# Patient Record
Sex: Male | Born: 1940
Health system: Southern US, Community
[De-identification: ages and names within clinical notes are randomized; demographics above are authoritative.]

## PROBLEM LIST (undated history)

## (undated) DIAGNOSIS — I10 Essential (primary) hypertension: Secondary | ICD-10-CM

## (undated) DIAGNOSIS — Z9289 Personal history of other medical treatment: Secondary | ICD-10-CM

## (undated) DIAGNOSIS — I25119 Atherosclerotic heart disease of native coronary artery with unspecified angina pectoris: Secondary | ICD-10-CM

## (undated) DIAGNOSIS — Z8719 Personal history of other diseases of the digestive system: Secondary | ICD-10-CM

## (undated) DIAGNOSIS — E669 Obesity, unspecified: Secondary | ICD-10-CM

## (undated) DIAGNOSIS — K219 Gastro-esophageal reflux disease without esophagitis: Secondary | ICD-10-CM

## (undated) DIAGNOSIS — E785 Hyperlipidemia, unspecified: Secondary | ICD-10-CM

## (undated) HISTORY — DX: Personal history of other medical treatment: Z92.89

## (undated) HISTORY — DX: Essential (primary) hypertension: I10

## (undated) HISTORY — PX: CATARACT EXTRACTION W/ INTRAOCULAR LENS  IMPLANT, BILATERAL: SHX1307

## (undated) HISTORY — DX: Gastro-esophageal reflux disease without esophagitis: K21.9

## (undated) HISTORY — DX: Hyperlipidemia, unspecified: E78.5

---

## 1988-12-01 HISTORY — PX: CORONARY ARTERY BYPASS GRAFT: SHX141

## 1998-09-01 ENCOUNTER — Ambulatory Visit (HOSPITAL_COMMUNITY): Admission: RE | Admit: 1998-09-01 | Discharge: 1998-09-01 | Payer: Self-pay | Admitting: Sports Medicine

## 1998-09-13 ENCOUNTER — Encounter: Admission: RE | Admit: 1998-09-13 | Discharge: 1998-09-13 | Payer: Self-pay | Admitting: Sports Medicine

## 1998-10-08 ENCOUNTER — Encounter: Admission: RE | Admit: 1998-10-08 | Discharge: 1998-10-08 | Payer: Self-pay | Admitting: Family Medicine

## 1999-02-21 ENCOUNTER — Encounter: Admission: RE | Admit: 1999-02-21 | Discharge: 1999-02-21 | Payer: Self-pay | Admitting: Family Medicine

## 1999-03-03 ENCOUNTER — Emergency Department (HOSPITAL_COMMUNITY): Admission: EM | Admit: 1999-03-03 | Discharge: 1999-03-03 | Payer: Self-pay | Admitting: Emergency Medicine

## 1999-10-31 ENCOUNTER — Encounter: Admission: RE | Admit: 1999-10-31 | Discharge: 1999-10-31 | Payer: Self-pay | Admitting: Sports Medicine

## 1999-11-18 ENCOUNTER — Ambulatory Visit (HOSPITAL_COMMUNITY): Admission: RE | Admit: 1999-11-18 | Discharge: 1999-11-18 | Payer: Self-pay | Admitting: Sports Medicine

## 2000-01-20 ENCOUNTER — Encounter: Admission: RE | Admit: 2000-01-20 | Discharge: 2000-01-20 | Payer: Self-pay | Admitting: Family Medicine

## 2000-01-25 ENCOUNTER — Encounter: Payer: Self-pay | Admitting: Family Medicine

## 2000-01-25 ENCOUNTER — Ambulatory Visit (HOSPITAL_COMMUNITY): Admission: RE | Admit: 2000-01-25 | Discharge: 2000-01-25 | Payer: Self-pay | Admitting: Family Medicine

## 2000-01-30 ENCOUNTER — Encounter: Admission: RE | Admit: 2000-01-30 | Discharge: 2000-01-30 | Payer: Self-pay | Admitting: Family Medicine

## 2000-10-21 ENCOUNTER — Ambulatory Visit (HOSPITAL_COMMUNITY): Admission: RE | Admit: 2000-10-21 | Discharge: 2000-10-21 | Payer: Self-pay | Admitting: Sports Medicine

## 2001-02-04 ENCOUNTER — Encounter: Admission: RE | Admit: 2001-02-04 | Discharge: 2001-02-04 | Payer: Self-pay | Admitting: Family Medicine

## 2001-10-11 ENCOUNTER — Encounter: Admission: RE | Admit: 2001-10-11 | Discharge: 2001-10-11 | Payer: Self-pay | Admitting: Family Medicine

## 2002-09-15 ENCOUNTER — Encounter: Admission: RE | Admit: 2002-09-15 | Discharge: 2002-09-15 | Payer: Self-pay | Admitting: Sports Medicine

## 2003-04-13 ENCOUNTER — Encounter: Admission: RE | Admit: 2003-04-13 | Discharge: 2003-04-13 | Payer: Self-pay | Admitting: Sports Medicine

## 2003-05-03 ENCOUNTER — Ambulatory Visit (HOSPITAL_COMMUNITY): Admission: RE | Admit: 2003-05-03 | Discharge: 2003-05-03 | Payer: Self-pay | Admitting: Sports Medicine

## 2003-08-09 ENCOUNTER — Ambulatory Visit (HOSPITAL_COMMUNITY): Admission: RE | Admit: 2003-08-09 | Discharge: 2003-08-09 | Payer: Self-pay | Admitting: Family Medicine

## 2003-08-09 ENCOUNTER — Encounter: Admission: RE | Admit: 2003-08-09 | Discharge: 2003-08-09 | Payer: Self-pay | Admitting: Family Medicine

## 2003-08-24 ENCOUNTER — Encounter: Admission: RE | Admit: 2003-08-24 | Discharge: 2003-08-24 | Payer: Self-pay | Admitting: Family Medicine

## 2005-01-15 ENCOUNTER — Ambulatory Visit: Payer: Self-pay | Admitting: Sports Medicine

## 2005-01-17 ENCOUNTER — Ambulatory Visit: Payer: Self-pay | Admitting: Sports Medicine

## 2005-02-13 ENCOUNTER — Ambulatory Visit (HOSPITAL_COMMUNITY): Admission: RE | Admit: 2005-02-13 | Discharge: 2005-02-13 | Payer: Self-pay | Admitting: Family Medicine

## 2005-02-13 ENCOUNTER — Ambulatory Visit: Payer: Self-pay | Admitting: Family Medicine

## 2006-08-18 ENCOUNTER — Ambulatory Visit (HOSPITAL_COMMUNITY): Admission: RE | Admit: 2006-08-18 | Discharge: 2006-08-18 | Payer: Self-pay | Admitting: Interventional Cardiology

## 2006-08-27 ENCOUNTER — Ambulatory Visit: Payer: Self-pay | Admitting: Sports Medicine

## 2006-09-03 ENCOUNTER — Ambulatory Visit: Payer: Self-pay | Admitting: Sports Medicine

## 2006-12-28 ENCOUNTER — Ambulatory Visit: Payer: Self-pay | Admitting: Family Medicine

## 2006-12-28 ENCOUNTER — Encounter: Payer: Self-pay | Admitting: Family Medicine

## 2006-12-28 LAB — CONVERTED CEMR LAB
ALT: 32 units/L (ref 0–53)
AST: 26 units/L (ref 0–37)
Basophils Absolute: 0 10*3/uL (ref 0.0–0.1)
Basophils Relative: 1 % (ref 0–1)
Chloride: 103 meq/L (ref 96–112)
Creatinine, Ser: 1.04 mg/dL (ref 0.40–1.50)
Eosinophils Absolute: 0.2 10*3/uL (ref 0.0–0.7)
Eosinophils Relative: 3 % (ref 0–5)
HCT: 47.5 % (ref 39.0–52.0)
Hemoglobin: 15.5 g/dL (ref 13.0–17.0)
MCHC: 32.6 g/dL (ref 30.0–36.0)
Monocytes Absolute: 0.8 10*3/uL — ABNORMAL HIGH (ref 0.2–0.7)
RDW: 13.4 % (ref 11.5–14.0)
Sodium: 142 meq/L (ref 135–145)
Total Bilirubin: 0.5 mg/dL (ref 0.3–1.2)
Total Protein: 7.2 g/dL (ref 6.0–8.3)

## 2007-01-28 DIAGNOSIS — L578 Other skin changes due to chronic exposure to nonionizing radiation: Secondary | ICD-10-CM | POA: Insufficient documentation

## 2007-01-28 DIAGNOSIS — E785 Hyperlipidemia, unspecified: Secondary | ICD-10-CM | POA: Insufficient documentation

## 2007-01-28 DIAGNOSIS — I2510 Atherosclerotic heart disease of native coronary artery without angina pectoris: Secondary | ICD-10-CM

## 2007-01-28 DIAGNOSIS — K449 Diaphragmatic hernia without obstruction or gangrene: Secondary | ICD-10-CM | POA: Insufficient documentation

## 2007-12-01 ENCOUNTER — Encounter: Payer: Self-pay | Admitting: Sports Medicine

## 2008-01-18 ENCOUNTER — Encounter: Payer: Self-pay | Admitting: *Deleted

## 2008-02-03 ENCOUNTER — Encounter: Payer: Self-pay | Admitting: *Deleted

## 2008-02-03 ENCOUNTER — Ambulatory Visit: Payer: Self-pay | Admitting: Sports Medicine

## 2008-02-07 LAB — CONVERTED CEMR LAB
ALT: 88 units/L — ABNORMAL HIGH (ref 0–53)
AST: 70 units/L — ABNORMAL HIGH (ref 0–37)
Albumin: 4.5 g/dL (ref 3.5–5.2)
Calcium: 9.1 mg/dL (ref 8.4–10.5)
Chloride: 105 meq/L (ref 96–112)
Creatinine, Ser: 0.93 mg/dL (ref 0.40–1.50)
PSA: 1.03 ng/mL (ref 0.10–4.00)
Platelets: 191 10*3/uL (ref 150–400)
Potassium: 4.4 meq/L (ref 3.5–5.3)
RDW: 13.5 % (ref 11.5–15.5)
Sodium: 141 meq/L (ref 135–145)
Total CHOL/HDL Ratio: 4.2

## 2008-04-04 ENCOUNTER — Telehealth: Payer: Self-pay | Admitting: *Deleted

## 2008-04-27 ENCOUNTER — Ambulatory Visit: Payer: Self-pay | Admitting: Sports Medicine

## 2008-05-09 ENCOUNTER — Encounter (INDEPENDENT_AMBULATORY_CARE_PROVIDER_SITE_OTHER): Payer: Self-pay | Admitting: Gastroenterology

## 2008-05-09 ENCOUNTER — Encounter: Payer: Self-pay | Admitting: Sports Medicine

## 2008-05-09 ENCOUNTER — Ambulatory Visit (HOSPITAL_COMMUNITY): Admission: RE | Admit: 2008-05-09 | Discharge: 2008-05-09 | Payer: Self-pay | Admitting: Gastroenterology

## 2008-05-12 ENCOUNTER — Encounter: Payer: Self-pay | Admitting: *Deleted

## 2008-09-04 ENCOUNTER — Ambulatory Visit: Payer: Self-pay | Admitting: Family Medicine

## 2009-01-01 ENCOUNTER — Encounter: Payer: Self-pay | Admitting: *Deleted

## 2009-04-18 ENCOUNTER — Ambulatory Visit: Payer: Self-pay | Admitting: Family Medicine

## 2009-04-18 ENCOUNTER — Ambulatory Visit (HOSPITAL_COMMUNITY): Admission: RE | Admit: 2009-04-18 | Discharge: 2009-04-18 | Payer: Self-pay | Admitting: Family Medicine

## 2009-04-18 DIAGNOSIS — I10 Essential (primary) hypertension: Secondary | ICD-10-CM | POA: Insufficient documentation

## 2009-04-18 LAB — CONVERTED CEMR LAB
ALT: 62 units/L — ABNORMAL HIGH (ref 0–53)
CO2: 23 meq/L (ref 19–32)
Cholesterol: 162 mg/dL (ref 0–200)
LDL Cholesterol: 99 mg/dL (ref 0–99)
PSA: 1.21 ng/mL (ref 0.10–4.00)
Sodium: 141 meq/L (ref 135–145)
Total Bilirubin: 0.4 mg/dL (ref 0.3–1.2)
Total Protein: 6.8 g/dL (ref 6.0–8.3)
VLDL: 29 mg/dL (ref 0–40)

## 2009-04-19 ENCOUNTER — Encounter: Payer: Self-pay | Admitting: Family Medicine

## 2009-04-24 ENCOUNTER — Encounter: Payer: Self-pay | Admitting: Family Medicine

## 2009-05-30 ENCOUNTER — Ambulatory Visit: Payer: Self-pay | Admitting: Family Medicine

## 2009-12-01 HISTORY — PX: CORONARY ANGIOPLASTY WITH STENT PLACEMENT: SHX49

## 2010-01-16 ENCOUNTER — Ambulatory Visit (HOSPITAL_COMMUNITY): Admission: RE | Admit: 2010-01-16 | Discharge: 2010-01-16 | Payer: Self-pay | Admitting: Family Medicine

## 2010-01-16 ENCOUNTER — Ambulatory Visit: Payer: Self-pay | Admitting: Family Medicine

## 2010-01-16 DIAGNOSIS — N529 Male erectile dysfunction, unspecified: Secondary | ICD-10-CM

## 2010-01-16 DIAGNOSIS — R351 Nocturia: Secondary | ICD-10-CM

## 2010-01-16 LAB — CONVERTED CEMR LAB
AST: 54 units/L — ABNORMAL HIGH (ref 0–37)
Alkaline Phosphatase: 44 units/L (ref 39–117)
Bilirubin, Direct: 0.1 mg/dL (ref 0.0–0.3)
Indirect Bilirubin: 0.4 mg/dL (ref 0.0–0.9)
Potassium: 4.1 meq/L (ref 3.5–5.3)
Sodium: 138 meq/L (ref 135–145)
Total Bilirubin: 0.5 mg/dL (ref 0.3–1.2)

## 2010-01-17 ENCOUNTER — Encounter: Payer: Self-pay | Admitting: Family Medicine

## 2010-02-12 ENCOUNTER — Inpatient Hospital Stay (HOSPITAL_BASED_OUTPATIENT_CLINIC_OR_DEPARTMENT_OTHER): Admission: RE | Admit: 2010-02-12 | Discharge: 2010-02-12 | Payer: Self-pay | Admitting: Interventional Cardiology

## 2010-02-13 ENCOUNTER — Encounter: Payer: Self-pay | Admitting: Family Medicine

## 2010-02-15 ENCOUNTER — Inpatient Hospital Stay (HOSPITAL_COMMUNITY): Admission: RE | Admit: 2010-02-15 | Discharge: 2010-02-16 | Payer: Self-pay | Admitting: Interventional Cardiology

## 2010-04-17 ENCOUNTER — Encounter: Payer: Self-pay | Admitting: Family Medicine

## 2010-07-10 ENCOUNTER — Encounter: Payer: Self-pay | Admitting: Family Medicine

## 2010-07-23 ENCOUNTER — Encounter: Payer: Self-pay | Admitting: Family Medicine

## 2010-07-23 DIAGNOSIS — H3560 Retinal hemorrhage, unspecified eye: Secondary | ICD-10-CM

## 2010-10-07 ENCOUNTER — Ambulatory Visit: Payer: Self-pay | Admitting: Family Medicine

## 2010-10-07 DIAGNOSIS — M79609 Pain in unspecified limb: Secondary | ICD-10-CM | POA: Insufficient documentation

## 2010-11-04 ENCOUNTER — Ambulatory Visit: Payer: Self-pay | Admitting: Family Medicine

## 2010-11-04 DIAGNOSIS — M25539 Pain in unspecified wrist: Secondary | ICD-10-CM | POA: Insufficient documentation

## 2010-11-08 ENCOUNTER — Telehealth (INDEPENDENT_AMBULATORY_CARE_PROVIDER_SITE_OTHER): Payer: Self-pay | Admitting: *Deleted

## 2010-11-08 ENCOUNTER — Encounter: Payer: Self-pay | Admitting: Family Medicine

## 2010-11-18 ENCOUNTER — Encounter: Payer: Self-pay | Admitting: Family Medicine

## 2010-11-22 ENCOUNTER — Encounter: Payer: Self-pay | Admitting: Family Medicine

## 2010-12-06 ENCOUNTER — Ambulatory Visit: Admit: 2010-12-06 | Payer: Self-pay | Admitting: Family Medicine

## 2010-12-20 ENCOUNTER — Other Ambulatory Visit (HOSPITAL_COMMUNITY): Payer: Self-pay | Admitting: Interventional Cardiology

## 2010-12-21 ENCOUNTER — Other Ambulatory Visit (HOSPITAL_COMMUNITY): Payer: Self-pay | Admitting: Interventional Cardiology

## 2010-12-21 DIAGNOSIS — I2510 Atherosclerotic heart disease of native coronary artery without angina pectoris: Secondary | ICD-10-CM

## 2010-12-31 NOTE — Letter (Signed)
Summary: Wellness visit letter  [facility]  [address]   [place], [place] [address]   Phone: [phone]  Fax: [phone]    04/17/2010  Eulogio Samaras [address] [place], [place]  [address]  Dear Mr. ACCARDO,  We are happy to let you know that since you are covered under Medicare you are able to have a FREE visit at the [facility] to discuss your HEALTH. This is a new benefit for Medicare.  There will be no co-payment.  At this visit you will meet with Luretha Murphy an expert in wellness and the nurse practitioner at our clinic.  At this visit we will discuss ways to keep you healthy and feeling well.  This visit will not replace your regular doctor visit and we cannot refill medications.  We may schedule future blood work, give shots if needed, or schedule tests to look for hidden problems.   You will need to plan to be here at least one hour to talk about your medical history, your current status, review all of your medications, and discuss your future plans for your health.  This information will be entered into your record for your doctor to have and review.  If you are interested in staying healthy, this type of visit can help.  Please call the office at: [phone], to schedule a "Medicare Wellness Visit".  The day of the visit you should bring in all of your medications, including any vitamins, herbs, over the counter products you take.  Make a list of all the other doctors that you see, so we know who they are. If you have any other health documents please bring them.  We look forward to helping you stay healthy.    Sincerely,   Luretha Murphy NP  Appended Document: Wellness visit letter mailed.

## 2010-12-31 NOTE — Assessment & Plan Note (Signed)
Summary: F/U HAND,MC   Vital Signs:  Patient profile:   70 year old male BP sitting:   142 / 84  Vitals Entered By: Lillia Pauls CMA (November 04, 2010 2:30 PM)  Serial Vital Signs/Assessments:  Time      Position  BP       Pulse  Resp  Temp     By                     135/72                         [name] MD   History of Present Illness: 1,2) Continued pain, stiffness and numbness in hand--the stiffness is  less than at last visit--he thinks thre diclofenac helped some. He can now localize his pain better and can localize a numbness  primarily im his thumb index finger and long finger. Describes as a burning--worse at night and when he sleeps. He has to wake up and "hang my arm off the bed" to make it better.  PERTINENT PMH/PSH:  He has history of ulnar neuropathy at elow on left  3) also f/u htn--brings some BP readings. 110/62 -132/78--essentially one month of readings.. Taking medicines regularly with no problems. Not having any any headaches or chest pains.    Current Medications (verified): 1)  Tenoretic 100 100-25 Mg Tabs (Atenolol-Chlorthalidone) .Marland Kitchen.. 1 By Mouth Qd 2)  Prilosec 20 Mg  Cpdr (Omeprazole) .... Take One Capsule Daily 3)  Vytorin 10-80 Mg  Tabs (Ezetimibe-Simvastatin) .... Take One Tablet Daily 4)  Nexium 20 Mg  Cpdr (Esomeprazole Magnesium) .... Take One Capsule Daily 5)  Diclofenac Sodium 75 Mg Tbec (Diclofenac Sodium) .... Two Times A Day As Directed  Allergies: 1)  Sulfa 2)  Crestor (Rosuvastatin Calcium) 3)  Lipitor (Atorvastatin) 4)  Ace Inhibitors  Past History:  Past Medical History: Last updated: 02/13/2010 peridontal disease,  ulnar neuropathy  lt at elbow allergic to sulfa cath 02/12/2009 disease in grafts Dr Verdis Prime  Physical Exam  General:  alert, well-developed, well-nourished, and well-hydrated.   Neck:  supple, full ROM, no masses, and no thyromegaly.   Lungs:  normal respiratory effort and normal breath sounds.   Heart:   normal rate, regular rhythm, and no murmur.   Msk:  WRIST: Negative tinel and phalen's on left wrist. Full strength flexion / extension wrist.  FOREARM: normal supination / pronation ROM and strength Partially positive tinel at cubital tunnel but does  not radiate to hand.  HAMD: No thenar atrophy. Grip strength seems symmetrical to me 5/5.   DIP / PIP joints some slight arthritic change but no gross deformity  NEURO soft touch  sense is intact B UE. DTRs 1+ forearm  and elbow B symmetrical  VASC: radial pulse 2+ B=. Allen's test normal B.   Impression & Recommendations:  Problem # 1:  WRIST PAIN, LEFT (ION-629.52)  Orders: Splint Wrist (W4132) I think his PAIN and STIFFNESS iin wrist  is from some mild OA and that is why diclofenac helped. I think his pain in his hand is more of a neuropathy--given his hx of yulnar neuropathy (idiopathio and his current McDonald will place in cock up wrist splint for night use, getn PNCVs. For now continue diclofenac although I would not want to keep that long term due to his CV issues.  RTC 1 m (after PNCV)  Problem # 2:  HYPERTENSION, BENIGN ESSENTIAL (  ICD-401.1)  His updated medication list for this problem includes:    Tenoretic 100 100-25 Mg Tabs (Atenolol-chlorthalidone) .Marland Kitchen... 1 by mouth qd home BP readings are really good. He was a little higher initiartclly today so there may be some anxiety issues (white coat htn). No med changes.  Complete Medication List: 1)  Tenoretic 100 100-25 Mg Tabs (Atenolol-chlorthalidone) .Marland Kitchen.. 1 by mouth qd 2)  Prilosec 20 Mg Cpdr (Omeprazole) .... Take one capsule daily 3)  Vytorin 10-80 Mg Tabs (Ezetimibe-simvastatin) .... Take one tablet daily 4)  Nexium 20 Mg Cpdr (Esomeprazole magnesium) .... Take one capsule daily 5)  Diclofenac Sodium 75 Mg Tbec (Diclofenac sodium) .... Two times a day as directed Nerve Conduction (Nerve Conduction) Splint Wrist (V7846)   Orders Added: 1)  Nerve Conduction [Nerve  Conduction] 2)  Splint Wrist [L3908] 3)  Est. Patient Level IV [[address]]    Prevention & Chronic Care Immunizations   Influenza vaccine: Fluvax Non-MCR  (09/05/2008)   Influenza vaccine due: 09/05/2009    Tetanus booster: Not documented    Pneumococcal vaccine: Pneumovax (Medicare)  (04/18/2009)   Pneumococcal vaccine due: None    H. zoster vaccine: Not documented  Colorectal Screening   Hemoccult: not indicated  (05/30/2009)   Hemoccult due: Not Indicated    Colonoscopy: abnormal  (05/04/2008)   Colonoscopy due: 05/04/2013  Other Screening   PSA: 1.21  (04/18/2009)   PSA due due: 04/18/2010   Smoking status: never  (10/07/2010)  Lipids   Total Cholesterol: 162  (04/18/2009)   Lipid panel action/deferral: Lipid Panel ordered   LDL: 99  (04/18/2009)   LDL Direct: Not documented   HDL: 34  (04/18/2009)   Triglycerides: 143  (04/18/2009)    SGOT (AST): 54  (01/16/2010)   BMP action: Ordered   SGPT (ALT): 52  (01/16/2010)   Alkaline phosphatase: 44  (01/16/2010)   Total bilirubin: 0.5  (01/16/2010)  Hypertension   Last Blood Pressure: 142 / 84  (11/04/2010)   Serum creatinine: 0.91  (01/16/2010)   Serum potassium 4.1  (01/16/2010)    Hypertension flowsheet reviewed?: Yes   Progress toward BP goal: At goal  Self-Management Support :   Personal Goals (by the next clinic visit) :      Personal blood pressure goal: 130/80  (01/16/2010)     Personal LDL goal: 100  (01/16/2010)    Hypertension self-management support: Not documented    Lipid self-management support: Not documented     Impression & Recommendations: Orders: Splint Wrist (M8413) His updated medication list for this problem includes:    Tenoretic 100 100-25 Mg Tabs (Atenolol-chlorthalidone) .Marland Kitchen... 1 by mouth qd  Complete Medication List: 1)  Tenoretic 100 100-25 Mg Tabs (Atenolol-chlorthalidone) .Marland Kitchen.. 1 by mouth qd 2)  Prilosec 20 Mg Cpdr (Omeprazole) .... Take one capsule daily 3)  Vytorin  10-80 Mg Tabs (Ezetimibe-simvastatin) .... Take one tablet daily 4)  Nexium 20 Mg Cpdr (Esomeprazole magnesium) .... Take one capsule daily 5)  Diclofenac Sodium 75 Mg Tbec (Diclofenac sodium) .... Two times a day as directed  Other Orders: Nerve Conduction (Nerve Conduction)

## 2010-12-31 NOTE — Assessment & Plan Note (Signed)
Summary: cpe,df   Vital Signs:  Patient profile:   70 year old male Height:      66.5 inches Weight:      203.4 pounds BMI:     32.45 Temp:     98.0 degrees F oral Pulse rate:   56 / minute BP sitting:   127 / 77  (left arm) Cuff size:   large  Vitals Entered By: Gladstone Pih (January 16, 2010 8:41 AM) CC: CPE Is Patient Diabetic? No Pain Assessment Patient in pain? no      Comments C/O cold and cough X 3 mos, insomnia,restless leg    CC:  CPE.  History of Present Illness: here for CPE but has several issues 1) cough off and on for 3 monhs. seen twice at [facility] and tx both times w abx. He got some better but now is having cough again and somee significant fatigue. Denies chest  pains, no change in exertion level, no dizziness, no edema.   2) Insomnia--some difficulty getting to sleep but nce asleep cannot maintain--has to get up and go to batheroom several times a nigt and this seems to disrupt his sleep as well.   3) Erectile dysfunction--erections no longer sufficient for full intercourse.  4) concerns about "heart" as his CABG was 30 y ago and he has not been re-eval by stress test or other functinal study since 2007 (Dr Katrinka Blazing) .  5) saw the eye doctor recenty ahd they found a hemorrhage on his right retina--they are following it up in 3 months.  Habits & Providers  Alcohol-Tobacco-Diet     Tobacco Status: never  Current Medications (verified): 1)  Tenoretic 100 100-25 Mg Tabs (Atenolol-Chlorthalidone) .Marland Kitchen.. 1 By Mouth Qd 2)  Prilosec 20 Mg  Cpdr (Omeprazole) .... Take One Capsule Daily 3)  Vytorin 10-80 Mg  Tabs (Ezetimibe-Simvastatin) .... Take One Tablet Daily 4)  Nexium 20 Mg  Cpdr (Esomeprazole Magnesium) .... Take One Capsule Daily  Allergies: 1)  Sulfa 2)  Crestor (Rosuvastatin Calcium) 3)  Lipitor (Atorvastatin) 4)  Ace Inhibitors  Past History:  Past Medical History: Last updated: 04/27/2008 peridontal disease,  ulnar neuropathy  lt at  elbow allergic to sulfa  Past Surgical History: Last updated: 04/18/2009 coronary artery bypass  4V- 12/01/1988, Cryotherapy - 09/03/2006,  ETT - [employer] fitness - 05/02/2003,  ETT -dr Katrinka Blazing - 9:13mins - 08/27/2006  UGI Series -  Family History: Last updated: 02/03/2008 brother with cervical disk  father MI/ chf died 40  mother RA/ uterine cancer died 41s  Social History: Last updated: 02/03/2008 drives truck now retired;  eats low fat/ low salt at home but not on road  exercises little;  no smoking/ no etoh helps care for grand kids  Risk Factors: Smoking Status: never (01/16/2010)  Physical Exam  General:  alert and well-developed.   Eyes:  vision grossly intact, pupils equal, pupils round, and pupils reactive to light.   Ears:  R ear normal and L ear normal.   Mouth:  pharynx pink and moist.   Neck:  supple, full ROM, no masses, no thyromegaly, no JVD, and no carotid bruits.   Chest Wall:  well healed central scar Lungs:  normal respiratory effort and normal breath sounds.   Heart:  normal rate, regular rhythm, and no murmur.   Abdomen:  soft and non-tender.   Genitalia:  deferred to urology Prostate:  deferred to urology Msk:  normal ROM, no joint tenderness, and no joint swelling.   Pulses:  DP 2+ B= Extremities:  no edema Neurologic:  alert & oriented X3, strength normal in all extremities, and gait normal.   Skin:  full skin exam revealed no worrisome lesions Cervical Nodes:  No lymphadenopathy noted Psych:  Oriented X3, good eye contact, not anxious appearing, and not depressed appearing.     Impression & Recommendations:  Problem # 1:  COUGH (ICD-786.2)  Orders: CXR- 2view (CXR) leng exam normal. Will check CXR. Cough non productive--allergy and post nasal gtt most liekly source  or repeat viral URI. f/u 1 m if continued cough  Problem # 2:  CORONARY, ARTERIOSCLEROSIS (ICD-414.00)  His updated medication list for this problem includes:    Tenoretic 100  100-25 Mg Tabs (Atenolol-chlorthalidone) .Marland Kitchen... 1 by mouth qd  Orders: Cardiology Referral (Cardiology) he needs to be followed by cardiology and likely needs functional study as CABG now 42 y old.  Problem # 3:  HYPERTENSION, BENIGN ESSENTIAL (ICD-401.1)  His updated medication list for this problem includes:    Tenoretic 100 100-25 Mg Tabs (Atenolol-chlorthalidone) .Marland Kitchen... 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 PSA 1.21 in May 2010  Problem # 5:  HYPERLIPIDEMIA (ICD-272.4)  His updated medication list for this problem includes:    Vytorin 10-80 Mg Tabs (Ezetimibe-simvastatin) .Marland Kitchen... Take one tablet daily  Future Orders: T-Lipid Profile ([id number]) ... 01/16/2011  Complete Medication List: 1)  Tenoretic 100 100-25 Mg Tabs (Atenolol-chlorthalidone) .Marland Kitchen.. 1 by mouth qd 2)  Prilosec 20 Mg Cpdr (Omeprazole) .... Take one capsule daily 3)  Vytorin 10-80 Mg Tabs (Ezetimibe-simvastatin) .... Take one tablet daily 4)  Nexium 20 Mg Cpdr (Esomeprazole magnesium) .... Take one capsule daily  Other Orders: T-Hepatic Function [phone]) Future Orders: PSA-[facility] ([id number]) ... 01/16/2011   Prevention & Chronic Care Immunizations   Influenza vaccine: Fluvax Non-MCR  (09/05/2008)   Influenza vaccine due: 09/05/2009    Tetanus booster: Not documented    Pneumococcal vaccine: Pneumovax (Medicare)  (04/18/2009)   Pneumococcal vaccine due: None    H. zoster vaccine: Not documented  Colorectal Screening   Hemoccult: not indicated  (05/30/2009)   Hemoccult due: Not Indicated    Colonoscopy: abnormal  (05/04/2008)   Colonoscopy due: 05/04/2013  Other Screening   PSA: 1.21  (04/18/2009)   PSA ordered.   PSA due due: 04/18/2010   Smoking status: never  (01/16/2010)  Lipids   Total Cholesterol: 162   (04/18/2009)   Lipid panel action/deferral: Lipid Panel ordered   LDL: 99  (04/18/2009)   LDL Direct: Not documented   HDL: 34  (04/18/2009)   Triglycerides: 143  (04/18/2009)    SGOT (AST): 65  (04/18/2009)   BMP action: Ordered   SGPT (ALT): 62  (04/18/2009)   Alkaline phosphatase: 57  (04/18/2009)   Total bilirubin: 0.4  (04/18/2009)    Lipid flowsheet reviewed?: Yes   Progress toward LDL goal: At goal  Hypertension   Last Blood Pressure: 127 / 77  (01/16/2010)   Serum creatinine: 1.06  (04/18/2009)   Serum potassium 4.2  (04/18/2009)    Hypertension flowsheet reviewed?: Yes   Progress toward BP goal: At goal  Self-Management Support :   Personal Goals (by the next clinic visit) :      Personal blood pressure goal: 130/80  (01/16/2010)     Personal LDL goal: 100  (01/16/2010)    Hypertension  self-management support: Not documented    Lipid self-management support: Not documented      Appended Document: cpe,df    Clinical Lists Changes  Problems: Added new problem of PREVENTIVE HEALTH CARE (ICD-V70.0) Orders: Added new Test order of [facility] [place] [facility] - Est  65+ [phone]) - Signed       Complete Medication List: 1)  Tenoretic 100 100-25 Mg Tabs (Atenolol-chlorthalidone) .Marland Kitchen.. 1 by mouth qd 2)  Prilosec 20 Mg Cpdr (Omeprazole) .... Take one capsule daily 3)  Vytorin 10-80 Mg Tabs (Ezetimibe-simvastatin) .... Take one tablet daily 4)  Nexium 20 Mg Cpdr (Esomeprazole magnesium) .... Take one capsule daily

## 2010-12-31 NOTE — Letter (Signed)
Summary: [facility]  [address]   [place], [place] [address]   Phone: [phone]  Fax: [phone]    01/17/2010  Joshua [name] [address] [place], [place]  [address]  Dear [name],  Your blood sugar, kiney function, electrolytes and kidney function are all normal. Your liver function tests have a very mild but insignificant elevation--probably related to your treatment with the vytorin. It is in the  range where I would recheck in 6 months, but I would not change anything.          Sincerely,   [name] MD  Appended Document: LABLetter mailed.

## 2010-12-31 NOTE — Assessment & Plan Note (Signed)
Summary: HAND PAIN,MC   Vital Signs:  Patient profile:   70 year old male Height:      69 inches Weight:      204 pounds BP sitting:   159 / 89  Vitals Entered By: Rochele Pages RN (October 07, 2010 2:31 PM)   History of Present Illness: Left hand pain 10 days. Started after he "scraped" his arm on a bush. Had small laceration but did not observe any swelling or erythema. Pain has been worse in am--difficulty closing his hand fully and pain with grasp--this usually gets better during the day. Today he actually had NO hand pain this am and feels fine now.  Denies fever, red streaks down arm or other signs of infection. Right hand dominant. Otherwise feels generally at baseline  Preventive Screening-Counseling & Management  Alcohol-Tobacco     Smoking Status: never  Current Medications (verified): 1)  Tenoretic 100 100-25 Mg Tabs (Atenolol-Chlorthalidone) .Marland Kitchen.. 1 By Mouth Qd 2)  Prilosec 20 Mg  Cpdr (Omeprazole) .... Take One Capsule Daily 3)  Vytorin 10-80 Mg  Tabs (Ezetimibe-Simvastatin) .... Take One Tablet Daily 4)  Nexium 20 Mg  Cpdr (Esomeprazole Magnesium) .... Take One Capsule Daily 5)  Diclofenac Sodium 75 Mg Tbec (Diclofenac Sodium) .... Two Times A Day As Directed  Allergies: 1)  Sulfa 2)  Crestor (Rosuvastatin Calcium) 3)  Lipitor (Atorvastatin) 4)  Ace Inhibitors  Review of Systems  The patient denies anorexia, fever, peripheral edema, headaches, abdominal pain, muscle weakness, and angioedema.    Physical Exam  General:  alert, well-developed, well-nourished, and well-hydrated.   Additional Exam:  RIGHT hand normal strength on grip, wrist flexion and extension. Fingers normal adduction and abduction. Thumb normal in all planes.   PULSES 2+ symmetrically + radial.  SKIN no lesions on hands, some generlly dry skin, left forearm small scraped area that is healing without any sogn of infection.  AXILLA left no LAD. SHOULDER FROM.   Impression &  Recommendations:  Problem # 1:  HAND PAIN, LEFT (ICD-729.5) unclea4r--he may have had a small contusion associated with his "scrape" that he was unaware of--injuring  forearm causing some stiffness OR this may be some generalized DJD. Will treat with NSAID 1 week and see how he does.  Complete Medication List: 1)  Tenoretic 100 100-25 Mg Tabs (Atenolol-chlorthalidone) .Marland Kitchen.. 1 by mouth qd 2)  Prilosec 20 Mg Cpdr (Omeprazole) .... Take one capsule daily 3)  Vytorin 10-80 Mg Tabs (Ezetimibe-simvastatin) .... Take one tablet daily 4)  Nexium 20 Mg Cpdr (Esomeprazole magnesium) .... Take one capsule daily 5)  Diclofenac Sodium 75 Mg Tbec (Diclofenac sodium) .... Two times a day as directed Prescriptions: DICLOFENAC SODIUM 75 MG TBEC (DICLOFENAC SODIUM) two times a day as directed  #60 x 0   Entered and Authorized by:   [name] MD   Signed by:   [name] MD on 10/07/2010   Method used:   Electronically to        [facility]* (retail)       [address]       [address] Shipping/mailing       [place], [place]  [address]       Ph: [phone]       Fax: [phone]   RxID:   [id number]    Orders Added: 1)  Est. Patient Level III [51884]

## 2010-12-31 NOTE — Miscellaneous (Signed)
  Clinical Lists Changes  Observations: Added new observation of PAST MED HX: peridontal disease,  ulnar neuropathy  lt at elbow allergic to sulfa cath 02/12/2009 disease in grafts Dr Verdis Prime (02/13/2010 8:37)      Complete Medication List: 1)  Tenoretic 100 100-25 Mg Tabs (Atenolol-chlorthalidone) .Marland Kitchen.. 1 by mouth qd 2)  Prilosec 20 Mg Cpdr (Omeprazole) .... Take one capsule daily 3)  Vytorin 10-80 Mg Tabs (Ezetimibe-simvastatin) .... Take one tablet daily 4)  Nexium 20 Mg Cpdr (Esomeprazole magnesium) .... Take one capsule daily   Past History:  Past Medical History: peridontal disease,  ulnar neuropathy  lt at elbow allergic to sulfa cath 02/12/2009 disease in grafts Dr Verdis Prime

## 2010-12-31 NOTE — Consult Note (Signed)
Summary: [facility]  [facility]   Imported By: Clydell Hakim 07/17/2010 15:02:32  _____________________________________________________________________  External Attachment:    Type:   Image     Comment:   External Document

## 2010-12-31 NOTE — Progress Notes (Signed)
  Phone Note Other Incoming Call back at [phone] ext [phone]   Caller: [facility] Summary of Call: Send office notes on patient for referral.  Fax# [phone] Initial call taken by: Abundio Miu,  November 08, 2010 9:33 AM  Follow-up for Phone Call        faxed Follow-up by: Lillia Pauls CMA,  November 08, 2010 11:34 AM

## 2010-12-31 NOTE — Letter (Signed)
Summary: [facility]   [facility]   Imported By: Marily Memos 11/04/2010 15:20:56  _____________________________________________________________________  External Attachment:    Type:   Image     Comment:   External Document

## 2010-12-31 NOTE — Miscellaneous (Signed)
  Clinical Lists Changes  Problems: Added new problem of History of  RETINAL HEMORRHAGE (ICD-362.81) Removed problem of COUGH (ICD-786.2) Removed problem of UNSPECIFIED SINUSITIS (ICD-473.9) Removed problem of SPECIAL SCREENING MALIGNANT NEOPLASM OF PROSTATE (ICD-V76.44)

## 2011-01-02 NOTE — Consult Note (Signed)
Summary: [facility]  [facility]   Imported By: De Nurse 11/21/2010 16:22:59  _____________________________________________________________________  External Attachment:    Type:   Image     Comment:   External Document

## 2011-01-02 NOTE — Letter (Signed)
Summary: [name]   Imported By: Marily Memos 11/11/2010 10:28:30  _____________________________________________________________________  External Attachment:    Type:   Image     Comment:   External Document

## 2011-01-02 NOTE — Letter (Signed)
Summary: NCS Letter  [facility]  [address]   [place], [place] [address]   Phone: [phone]  Fax: [phone]    11/22/2010  Irby Perra [address] [place], [place]  [address]  Dear Mr. JANES,   The nerveconductin studies showed a mild carpal tunnel syndrome. I think this accounts for your symptoms. If it is bothering you enough, I can set you up to see a surgeon. Let me know if you want to do that.        Sincerely,   [name] MD  Appended Document: NCS Letter mailed

## 2011-01-09 ENCOUNTER — Encounter: Payer: Self-pay | Admitting: *Deleted

## 2011-02-11 ENCOUNTER — Encounter: Payer: Self-pay | Admitting: Home Health Services

## 2011-02-18 ENCOUNTER — Encounter: Payer: Self-pay | Admitting: Family Medicine

## 2011-02-24 LAB — POCT I-STAT, CHEM 8
BUN: 16 mg/dL (ref 6–23)
Calcium, Ion: 1.12 mmol/L (ref 1.12–1.32)
Creatinine, Ser: 0.9 mg/dL (ref 0.4–1.5)
TCO2: 26 mmol/L (ref 0–100)

## 2011-02-24 LAB — BASIC METABOLIC PANEL
CO2: 27 mEq/L (ref 19–32)
Calcium: 8.7 mg/dL (ref 8.4–10.5)
Chloride: 96 mEq/L (ref 96–112)
Creatinine, Ser: 1 mg/dL (ref 0.4–1.5)
Glucose, Bld: 107 mg/dL — ABNORMAL HIGH (ref 70–99)

## 2011-02-24 LAB — CBC
HCT: 39.5 % (ref 39.0–52.0)
MCV: 93 fL (ref 78.0–100.0)
RBC: 4.25 MIL/uL (ref 4.22–5.81)
WBC: 6.7 10*3/uL (ref 4.0–10.5)

## 2011-02-27 NOTE — Miscellaneous (Signed)
  Clinical Lists Changes  Observations: Added new observation of DM PROGRESS: N/A (02/18/2011 16:31) Added new observation of DM FSREVIEW: N/A (02/18/2011 16:31)      Complete Medication List: 1)  Tenoretic 100 100-25 Mg Tabs (Atenolol-chlorthalidone) .Marland Kitchen.. 1 by mouth qd 2)  Prilosec 20 Mg Cpdr (Omeprazole) .... Take one capsule daily 3)  Vytorin 10-80 Mg Tabs (Ezetimibe-simvastatin) .... Take one tablet daily 4)  Nexium 20 Mg Cpdr (Esomeprazole magnesium) .... Take one capsule daily 5)  Diclofenac Sodium 75 Mg Tbec (Diclofenac sodium) .... 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 Dr Verdis Prime  Past Surgical History: Last updated: 04/18/2009 coronary artery bypass  4V- 12/01/1988, Cryotherapy - 09/03/2006,  ETT - high fitness - 05/02/2003,  ETT -dr Katrinka Blazing - 9:31mins - 08/27/2006  UGI Series -  Family History: Last updated: 02/03/2008 brother with cervical disk  father MI/ chf died 36  mother RA/ uterine cancer died 51s  Social History: Last updated: 02/03/2008 drives truck now retired;  eats low fat/ low salt at home but not on road  exercises little;  no smoking/ no etoh helps care for grand kids    Prevention & Chronic Care Immunizations   Influenza vaccine: Fluvax Non-MCR  (09/05/2008)   Influenza vaccine due: 09/05/2009    Tetanus booster: Not documented    Pneumococcal vaccine: Pneumovax (Medicare)  (04/18/2009)   Pneumococcal vaccine due: None    H. zoster vaccine: Not documented  Colorectal Screening   Hemoccult: not indicated  (05/30/2009)   Hemoccult due: Not Indicated    Colonoscopy: abnormal  (05/04/2008)   Colonoscopy due: 05/04/2013  Other Screening   PSA: 1.21  (04/18/2009)   PSA due due: 04/18/2010   Smoking status: never  (10/07/2010)  Lipids   Total Cholesterol: 162   (04/18/2009)   Lipid panel action/deferral: Lipid Panel ordered   LDL: 99  (04/18/2009)   LDL Direct: Not documented   HDL: 34  (04/18/2009)   Triglycerides: 143  (04/18/2009)    SGOT (AST): 54  (01/16/2010)   BMP action: Ordered   SGPT (ALT): 52  (01/16/2010)   Alkaline phosphatase: 44  (01/16/2010)   Total bilirubin: 0.5  (01/16/2010)  Hypertension   Last Blood Pressure: 142 / 84  (11/04/2010)   Serum creatinine: 0.91  (01/16/2010)   Serum potassium 4.1  (01/16/2010)  Self-Management Support :   Personal Goals (by the next clinic visit) :      Personal blood pressure goal: 130/80  (01/16/2010)     Personal LDL goal: 100  (01/16/2010)    Hypertension self-management support: Not documented    Lipid self-management support: Not documented

## 2011-04-03 ENCOUNTER — Other Ambulatory Visit (HOSPITAL_COMMUNITY): Payer: Self-pay | Admitting: Interventional Cardiology

## 2011-04-03 DIAGNOSIS — I2510 Atherosclerotic heart disease of native coronary artery without angina pectoris: Secondary | ICD-10-CM

## 2011-04-04 ENCOUNTER — Encounter (HOSPITAL_COMMUNITY): Payer: Self-pay

## 2011-04-04 ENCOUNTER — Ambulatory Visit (HOSPITAL_COMMUNITY): Payer: Self-pay

## 2011-04-04 ENCOUNTER — Encounter (HOSPITAL_COMMUNITY)
Admission: RE | Admit: 2011-04-04 | Discharge: 2011-04-04 | Disposition: A | Discharge status: Home / Self Care | Payer: 59 | Source: Ambulatory Visit | Attending: Interventional Cardiology | Admitting: Interventional Cardiology

## 2011-04-04 DIAGNOSIS — R079 Chest pain, unspecified: Secondary | ICD-10-CM | POA: Insufficient documentation

## 2011-04-04 DIAGNOSIS — I2510 Atherosclerotic heart disease of native coronary artery without angina pectoris: Secondary | ICD-10-CM | POA: Insufficient documentation

## 2011-04-04 MED ORDER — TECHNETIUM TC 99M TETROFOSMIN IV KIT
10.0000 | PACK | Freq: Once | INTRAVENOUS | Status: AC | PRN
  Administered 2011-04-04: 10 via INTRAVENOUS

## 2011-04-04 MED ORDER — TECHNETIUM TC 99M TETROFOSMIN IV KIT
30.0000 | PACK | Freq: Once | INTRAVENOUS | Status: AC | PRN
  Administered 2011-04-04: 30 via INTRAVENOUS

## 2011-04-07 ENCOUNTER — Encounter (HOSPITAL_COMMUNITY): Payer: Self-pay

## 2011-04-15 NOTE — Op Note (Signed)
Joshua [name], Joshua [name]              ACCOUNT NO.:  [id number]   MEDICAL RECORD NO.:  [id number]          PATIENT TYPE:  AMB   LOCATION:  ENDO                         FACILITY:  [facility]   PHYSICIAN:  Bernette Redbird, M.D.   DATE OF BIRTH:  06/28/1941   DATE OF PROCEDURE:  05/09/2008  DATE OF DISCHARGE:                               OPERATIVE REPORT   PROCEDURE:  Colonoscopy with polypectomy.   INDICATIONS:  A 70 year old for initial colon cancer screening.  procedure, no worrisome risk factors or symptoms.   FINDINGS:  Two small polyps.   PROCEDURE:  The nature, purpose, and risks of the procedure had been  discussed with the patient, who came as an outpatient to the [facility]  Endoscopy Unit and provided written consent.  Sedation was fentanyl 100  mcg and Versed 8 mg IV, without arrhythmias or desaturation.  Digital  exam of the prostate showed it to be somewhat lobulated but smooth and  without discrete nodules.  The Pentax adult video colonoscope was  advanced around the colon to the cecum, turning the patient into the  supine position to facilitate advancement.  The terminal ileum was  entered for a short distance and appeared normal.  The appendiceal  orifice was identified.  Pullback was then performed.   On the way in, I encountered a 4 mm semipedunculated polyp in what I  believe was the region of the splenic flexure, removed by cold snare  technique and successfully retrieved.   There was also a 3 mm sessile polyp at the top of the cecum, removed by  a single cold biopsy.   No other polyps were seen, and there was no evidence of cancer, colitis,  vascular malformations, or diverticulosis.   I was unable to retroflex in the rectum due to a small rectal ampulla,  but repeated examination of the rectum including the distal rectum  disclosed no lesions.   The patient tolerated this procedure well, and there no apparent  complications.  The quality of the prep was quite  good, so it was felt  that all areas were well seen.   IMPRESSION:  1. Two small colon polyps, removed as described above.  2. Otherwise normal initial screening exam.   PLAN:  Await pathology results.           ______________________________  Bernette Redbird, M.D.     RB/MEDQ  D:  05/09/2008  T:  05/09/2008  Job:  [id number]   cc:   Royal Hawthorn B. Darrick Penna, M.D.

## 2011-05-26 ENCOUNTER — Other Ambulatory Visit: Payer: Self-pay | Admitting: Family Medicine

## 2011-05-28 ENCOUNTER — Other Ambulatory Visit: Payer: Self-pay | Admitting: Family Medicine

## 2011-05-28 MED ORDER — EZETIMIBE-SIMVASTATIN 10-80 MG PO TABS: 1.0000 | ORAL_TABLET | Freq: Every day | ORAL | Status: DC

## 2011-06-02 ENCOUNTER — Other Ambulatory Visit: Payer: Self-pay | Admitting: Family Medicine

## 2011-06-10 ENCOUNTER — Other Ambulatory Visit: Payer: Self-pay | Admitting: Family Medicine

## 2011-06-10 MED ORDER — ATENOLOL-CHLORTHALIDONE 100-25 MG PO TABS: 1.0000 | ORAL_TABLET | Freq: Every day | ORAL | Status: DC

## 2011-07-23 ENCOUNTER — Other Ambulatory Visit: Payer: Self-pay | Admitting: Family Medicine

## 2011-07-29 ENCOUNTER — Other Ambulatory Visit: Payer: Self-pay | Admitting: Family Medicine

## 2011-07-29 MED ORDER — ESOMEPRAZOLE MAGNESIUM 20 MG PO CPDR: 20.0000 mg | DELAYED_RELEASE_CAPSULE | Freq: Every day | ORAL | Status: DC

## 2011-09-17 ENCOUNTER — Ambulatory Visit (INDEPENDENT_AMBULATORY_CARE_PROVIDER_SITE_OTHER): Payer: 59 | Admitting: Family Medicine

## 2011-09-17 ENCOUNTER — Encounter: Payer: Self-pay | Admitting: Family Medicine

## 2011-09-17 VITALS — BP 148/88 | HR 49 | Temp 97.6°F | Ht 68.0 in | Wt 208.0 lb

## 2011-09-17 DIAGNOSIS — I2510 Atherosclerotic heart disease of native coronary artery without angina pectoris: Secondary | ICD-10-CM

## 2011-09-17 DIAGNOSIS — N529 Male erectile dysfunction, unspecified: Secondary | ICD-10-CM

## 2011-09-17 DIAGNOSIS — E785 Hyperlipidemia, unspecified: Secondary | ICD-10-CM

## 2011-09-17 DIAGNOSIS — Z23 Encounter for immunization: Secondary | ICD-10-CM

## 2011-09-17 DIAGNOSIS — I10 Essential (primary) hypertension: Secondary | ICD-10-CM

## 2011-09-17 LAB — CBC
MCV: 90.5 fL (ref 78.0–100.0)
Platelets: 176 10*3/uL (ref 150–400)
RBC: 4.82 MIL/uL (ref 4.22–5.81)
RDW: 13.3 % (ref 11.5–15.5)
WBC: 6.7 10*3/uL (ref 4.0–10.5)

## 2011-09-17 MED ORDER — PNEUMOCOCCAL VAC POLYVALENT 25 MCG/0.5ML IJ INJ
0.5000 mL | INJECTION | Freq: Once | INTRAMUSCULAR | Status: AC
  Administered 2011-09-17: 0.5 mL via INTRAMUSCULAR

## 2011-09-17 NOTE — Patient Instructions (Addendum)
I am checking some blood work and we'll send you a note about that You did not need a Pneumovax as you  had one in 2010. You will not need any booster shots for that. I am sending me some information about the Zostavax vaccine. I am giving you a Rx for some flexeril for your leg cramps. Use it at night as you ned it. It was great to see you!

## 2011-09-17 NOTE — Progress Notes (Signed)
  Subjective:    Patient ID: Joshua [name], male    DOB: 19-Oct-1941, 70 y.o.   MRN: [id number]  HPI F/U  HYPERLIPIDEMIA: Taking medicines regularly and without problem. No muscle aches that are concerning. F?U HTN: Taking his medicines regularly except he has not taken his dose yet this morning. He was fasting for his lab work so he did not take his a.m. medicines. Having any side effects from his medicines. He is able to afford them all.  Decreasing desire to be very active. Has had some problems with intimacy with his wife for a while. In recent months he's had no desire for that. She is unhappy with this. He does not really see a problem with that. He brings a note from her. She wants him to be checked for testosterone level and he agrees to do that.  Having some leg cramps at night. They're quite painful. They occur usually in his thighs and occasionally in his calves. He has to get up and walk around. About 3 nights a week he'll have sensation of restless legs that occurs only when he goes to bed. He also has to get up when he is having this symptom in place for a while. Both of these issues are interrupting his sleep. Once he is asleep he can maintain sleep and feels refreshed in the morning  Has continued on the Nexium and is having no breakthrough problems of reflux.  History of coronary artery disease. He has not had any return of his chest pains with exertion.   Review of Systems Denies fever, sweats, chills. No unusual weight change. Denies chest pain, denies extremity edema. Denies shortness of breath. He is having some occasional knee and wrist pains in the joints. He's noted no swelling of the joints. Denies depressive symptoms.    Objective:   Physical Exam   Vital signs reviewed GENERALl: Well developed, well nourished, in no acute distress. NECK: Supple, FROM, without lymphadenopathy.  THYROID: normal without nodularity CAROTID ARTERIES: without bruits LUNGS: clear to  auscultation bilaterally. No wheezes or rales. HEART: Regular rate and rhythm, no murmurs ABDOMEN: soft with positive bowel sounds NEURO: No gross focal deficits PSYCH AxO x 4, normally interactive with nomral speech content. Good eye contact. Neatly dressed.        Assessment & Plan:

## 2011-09-18 ENCOUNTER — Encounter: Payer: Self-pay | Admitting: Family Medicine

## 2011-09-18 LAB — LIPID PANEL
Cholesterol: 147 mg/dL (ref 0–200)
HDL: 27 mg/dL — ABNORMAL LOW (ref >39)
Total CHOL/HDL Ratio: 5.4 Ratio
Triglycerides: 151 mg/dL — ABNORMAL HIGH (ref <150)

## 2011-09-18 LAB — COMPREHENSIVE METABOLIC PANEL
ALT: 57 U/L — ABNORMAL HIGH (ref 0–53)
CO2: 26 mEq/L (ref 19–32)
Calcium: 9.4 mg/dL (ref 8.4–10.5)
Chloride: 100 mEq/L (ref 96–112)
Glucose, Bld: 85 mg/dL (ref 70–99)
Sodium: 139 mEq/L (ref 135–145)
Total Bilirubin: 0.6 mg/dL (ref 0.3–1.2)
Total Protein: 6.9 g/dL (ref 6.0–8.3)

## 2011-09-18 LAB — TESTOSTERONE, FREE, TOTAL, SHBG
Testosterone-% Free: 1.5 % — ABNORMAL LOW (ref 1.6–2.9)
Testosterone: 355.84 ng/dL (ref 250–890)

## 2012-04-19 ENCOUNTER — Ambulatory Visit (INDEPENDENT_AMBULATORY_CARE_PROVIDER_SITE_OTHER): Payer: 59 | Admitting: Physician Assistant

## 2012-04-19 VITALS — BP 131/69 | HR 61 | Temp 98.0°F | Resp 16 | Ht 66.25 in | Wt 209.4 lb

## 2012-04-19 DIAGNOSIS — R05 Cough: Secondary | ICD-10-CM

## 2012-04-19 MED ORDER — BENZONATATE 100 MG PO CAPS: 100.0000 mg | ORAL_CAPSULE | Freq: Three times a day (TID) | ORAL | Status: AC | PRN

## 2012-04-19 MED ORDER — GUAIFENESIN ER 1200 MG PO TB12: 1.0000 | ORAL_TABLET | Freq: Two times a day (BID) | ORAL | Status: DC | PRN

## 2012-04-19 MED ORDER — AZITHROMYCIN 500 MG PO TABS: 500.0000 mg | ORAL_TABLET | Freq: Every day | ORAL | Status: AC

## 2012-04-19 NOTE — Progress Notes (Signed)
  Subjective:    Patient ID: Chrys Racer, male    DOB: [birth year], 71 y.o.   MRN: [id number]  HPI Presents with cough, productive of whitish sputum, x 3 weeks.  No SOB, F/C, nausea, vomiting, diarrhea.  No nasal/sinus congestion, ear pressure, sore throat, HA.  OTC cough medicine without relief. Non-smoker.   Review of Systems As above.    Objective:   Physical Exam  Vital signs noted. Well-developed, well nourished WM who is awake, alert and oriented, in NAD. HEENT: [place]/AT, PERRL, EOMI.  Sclera and conjunctiva are clear.  EAC are patent, TMs are normal in appearance. Nasal mucosa is pink and moist. OP is clear. Fully compensated edentula. Neck: supple, non-tender, no lymphadenopathy, thyromegaly. Heart: RRR, no murmur Lungs: CTA Extremities: no cyanosis, clubbing or edema. Skin: warm and dry without rash.       Assessment & Plan:   1. Cough  Guaifenesin (MUCINEX MAXIMUM STRENGTH) 1200 MG TB12, azithromycin (ZITHROMAX) 500 MG tablet, benzonatate (TESSALON) 100 MG capsule   Patient Instructions  Get lots of rest and drink at least 64 ounces of water daily.  If you are not improving, or if your symptoms worsen, please return for re-evaluation.

## 2012-04-19 NOTE — Patient Instructions (Signed)
Get lots of rest and drink at least 64 ounces of water daily.  If you are not improving, or if your symptoms worsen, please return for re-evaluation.

## 2012-05-26 ENCOUNTER — Other Ambulatory Visit: Payer: Self-pay | Admitting: Family Medicine

## 2012-05-26 DIAGNOSIS — I2510 Atherosclerotic heart disease of native coronary artery without angina pectoris: Secondary | ICD-10-CM

## 2012-05-26 DIAGNOSIS — E785 Hyperlipidemia, unspecified: Secondary | ICD-10-CM

## 2012-05-27 NOTE — Telephone Encounter (Signed)
Dear Cliffton Asters Team OOOOPS--accidnetally closed mssg--can u call and let him know One refill sent Needs labs Fasting Order is in [facility]! [name]

## 2012-05-27 NOTE — Telephone Encounter (Signed)
Dear Cliffton Asters Team He NEEDS to come for labs--liver and cholesterol check--FASTING--order is in---I have only given him one refill on cholesterol med until he gets this odne THANKS! [name]

## 2012-05-27 NOTE — Telephone Encounter (Signed)
Called and spoke with pt's wife and appt for fasting lab has been made for 06/02/12 @ 845.Loralee Pacas [place]

## 2012-06-01 ENCOUNTER — Other Ambulatory Visit: Payer: Self-pay | Admitting: Family Medicine

## 2012-06-02 ENCOUNTER — Telehealth: Payer: Self-pay | Admitting: *Deleted

## 2012-06-02 ENCOUNTER — Other Ambulatory Visit: Payer: 59

## 2012-06-02 DIAGNOSIS — I2510 Atherosclerotic heart disease of native coronary artery without angina pectoris: Secondary | ICD-10-CM

## 2012-06-02 DIAGNOSIS — E785 Hyperlipidemia, unspecified: Secondary | ICD-10-CM

## 2012-06-02 NOTE — Progress Notes (Signed)
CMP AND FLP DONE TODAY Joshua [name] 

## 2012-06-02 NOTE — Telephone Encounter (Signed)
Wife is calling to see if the Rx for Atenolol can be changed to something else because Joshua [name] has developed a cough and she thinks it is caused by the Atenolol.

## 2012-06-02 NOTE — Telephone Encounter (Signed)
Error

## 2012-06-03 LAB — COMPREHENSIVE METABOLIC PANEL
ALT: 43 U/L (ref 0–53)
Albumin: 4.1 g/dL (ref 3.5–5.2)
CO2: 26 mEq/L (ref 19–32)
Calcium: 9 mg/dL (ref 8.4–10.5)
Chloride: 100 mEq/L (ref 96–112)
Glucose, Bld: 112 mg/dL — ABNORMAL HIGH (ref 70–99)
Potassium: 3.5 mEq/L (ref 3.5–5.3)
Sodium: 138 mEq/L (ref 135–145)
Total Bilirubin: 0.6 mg/dL (ref 0.3–1.2)
Total Protein: 6.5 g/dL (ref 6.0–8.3)

## 2012-06-03 LAB — LIPID PANEL
Cholesterol: 141 mg/dL (ref 0–200)
Triglycerides: 96 mg/dL (ref <150)
VLDL: 19 mg/dL (ref 0–40)

## 2012-06-07 ENCOUNTER — Telehealth: Payer: Self-pay | Admitting: Family Medicine

## 2012-06-07 ENCOUNTER — Telehealth: Payer: Self-pay | Admitting: *Deleted

## 2012-06-07 NOTE — Telephone Encounter (Signed)
Spoke with Joshua [name] and informed her that Dr. Jennette Kettle stated that he is NOT to D/C the atenolol and that it is not causing the cough. Offered an appointment she stated that she would call back after she checked his schedule.Loralee Pacas [place]

## 2012-06-07 NOTE — Telephone Encounter (Signed)
Message copied by Deno Etienne on Mon Jun 07, 2012 12:12 PM ------      Message from: [name] L      Created: Mon Jun 07, 2012  9:25 AM      Regarding: RE: requesting a change in meds       NO      He needs teh atenolol because hse has had heart surgery. Atenolol is NOT causing a ciugh. Perhaps she needs to come see me      THANKS!      [name]            ----- Message -----         From: Heath Gold, CMA         Sent: 06/02/2012   5:28 PM           To: Nestor Ramp, MD      Subject: requesting a change in meds                              Wife is calling to see if the Rx for Atenolol can be changed to something else because Mr. Coger has developed a cough and she thinks it is caused by the Atenolol.

## 2012-06-07 NOTE — Telephone Encounter (Signed)
Message copied by Nestor Ramp on Mon Jun 07, 2012  9:26 AM ------      Message from: Deno Etienne      Created: Wed Jun 02, 2012  5:28 PM      Regarding: requesting a change in meds       Wife is calling to see if the Rx for Atenolol can be changed to something else because Joshua [name] has developed a cough and she thinks it is caused by the Atenolol.

## 2012-06-07 NOTE — Telephone Encounter (Signed)
Dear Joshua [name] Team NO--he NEEDS atenolol as he has had a CABG in teh past (heart surgery). I doubt very much that atenolol is causing his cough. Perhaps he needs an appointment to see me sometime soon [facility]! [name]

## 2012-06-14 ENCOUNTER — Encounter: Payer: Self-pay | Admitting: Family Medicine

## 2012-07-19 ENCOUNTER — Other Ambulatory Visit: Payer: Self-pay | Admitting: *Deleted

## 2012-07-20 MED ORDER — ESOMEPRAZOLE MAGNESIUM 20 MG PO CPDR: 20.0000 mg | DELAYED_RELEASE_CAPSULE | Freq: Every day | ORAL | Status: DC

## 2012-08-26 ENCOUNTER — Other Ambulatory Visit: Payer: Self-pay | Admitting: Family Medicine

## 2012-08-27 ENCOUNTER — Telehealth: Payer: Self-pay | Admitting: *Deleted

## 2012-08-27 NOTE — Telephone Encounter (Signed)
Called and spoke with pt's wife [name] and told her that Dr. Jennette Kettle has sent in his Rx and would like for him to schedule an appt. She made an appt for him for 10.02.2013 @ 915 AM.Skarlett Sedlacek, [place]

## 2012-08-27 NOTE — Telephone Encounter (Signed)
Dear Cliffton Asters Team I refilled but I need to see him in next month or so--has been almost a year THANKS! [name]

## 2012-09-01 ENCOUNTER — Ambulatory Visit (INDEPENDENT_AMBULATORY_CARE_PROVIDER_SITE_OTHER): Payer: 59 | Admitting: Family Medicine

## 2012-09-01 ENCOUNTER — Encounter: Payer: Self-pay | Admitting: Family Medicine

## 2012-09-01 VITALS — BP 137/67 | HR 56 | Temp 98.6°F | Ht 66.5 in | Wt 212.4 lb

## 2012-09-01 DIAGNOSIS — I10 Essential (primary) hypertension: Secondary | ICD-10-CM

## 2012-09-01 DIAGNOSIS — Z23 Encounter for immunization: Secondary | ICD-10-CM

## 2012-09-01 DIAGNOSIS — E785 Hyperlipidemia, unspecified: Secondary | ICD-10-CM

## 2012-09-01 DIAGNOSIS — I2510 Atherosclerotic heart disease of native coronary artery without angina pectoris: Secondary | ICD-10-CM

## 2012-09-01 DIAGNOSIS — R05 Cough: Secondary | ICD-10-CM

## 2012-09-01 NOTE — Patient Instructions (Addendum)
I will call you with results of your chest x-ray. I do not think your cough is anything seriouso the fact that you had increasing cough right before you had to have a stent placed in her heart makes me want you to go ahead and see your cardiologist. Please call his office and see when your appointment is scheduled for. The atenolol medicine should not be causing cough.  I will have my office staff call you for an appointment with the dermatologist to see about your skin issues. None of these are particularly worrisome at this time. Some of these are seborrheic keratoses switched we discussed; a lot of people call these "barnacles" and they are benign. The other red or areas are actinic keratoses in the dermatologist will look at him more closely. Probably should see her once a year as you have had a lot of skin damage related to sound.  I would recommend starting back on regular daily exercise 20-30 minutes most days of the week. Even if you can do the regimen he use to on the treadmill or the bike, he needs to do something regularly. This is the best thing he can do for your heart and lungs..  I last checked her cholesterol and liver functions in July. I probably should check them about every 6 months or so. I will put a lab only order in for you to come in sometime in February to check liver functions and direct LDL cholesterol. You do not need to fast for that lab appointment. You do not need to see me for that appointment.  It is great to see you!. I will see you back in one year for followup and certainly sooner with any new problems.

## 2012-09-03 ENCOUNTER — Encounter: Payer: Self-pay | Admitting: Family Medicine

## 2012-09-03 ENCOUNTER — Ambulatory Visit (HOSPITAL_COMMUNITY)
Admission: RE | Admit: 2012-09-03 | Discharge: 2012-09-03 | Disposition: A | Discharge status: Home / Self Care | Payer: 59 | Source: Ambulatory Visit | Attending: Family Medicine | Admitting: Family Medicine

## 2012-09-03 DIAGNOSIS — R059 Cough, unspecified: Secondary | ICD-10-CM | POA: Insufficient documentation

## 2012-09-03 DIAGNOSIS — R05 Cough: Secondary | ICD-10-CM

## 2012-09-03 NOTE — Assessment & Plan Note (Signed)
I don't think his cough is related to his ASCVD. We will go and check chest x-ray today. He should make sure he has an appointment with his cardiologist in the next month or so.

## 2012-09-03 NOTE — Assessment & Plan Note (Signed)
No problems with his cholesterol medicines. We'll check labs today. No medication changes.

## 2012-09-03 NOTE — Progress Notes (Signed)
  Subjective:    Patient ID: Joshua [name], male    DOB: December 06, 1940, 71 y.o.   MRN: [id number]  HPI #1. Cough. Has noticed the last few weeks to months he's had intermittent nonproductive cough. Denies shortness of breath. Mostly worried about this because he had cough as a presenting symptom for his initial heart issues that led to his CABG. He also had chest pains when he was having back coughing he has not had chest pains now. He thinks he has an appointment with his cardiologist in the next month or so area . Denies unusual fatigue with exertion but admits he's not doing much exertion. 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 He's not walking on a treadmill or using the exercise bike. #2. Followup hyperlipidemia. He is eating pretty much what he thinks he should although sometimes he admits he eats too much of a portion. He is taking his cholesterol medicines without hobble him. #3. Hypertension and ASCVD. Taking his medicine regularly and no problems.   Review of Systems Denies unusual weight change, fever, sweats, chills. Has noted cough.    Objective:   Physical Exam  Vital signs reviewed GENERALl: Well developed, well nourished, in no acute distress. NECK: Supple, FROM, without lymphadenopathy.  THYROID: normal without nodularity CAROTID ARTERIES: without bruits LUNGS: clear to auscultation bilaterally. No wheezes or rales. HEART: Regular rate and rhythm, no murmurs ABDOMEN: soft with positive bowel sounds MSK: MOE x 4 s      Assessment & Plan:

## 2012-09-03 NOTE — Assessment & Plan Note (Signed)
Blood pressure seems well controlled. No medication changes. We'll check labs today.

## 2012-09-22 ENCOUNTER — Other Ambulatory Visit (HOSPITAL_COMMUNITY): Payer: Self-pay | Admitting: Interventional Cardiology

## 2012-09-22 DIAGNOSIS — I2510 Atherosclerotic heart disease of native coronary artery without angina pectoris: Secondary | ICD-10-CM

## 2012-09-29 ENCOUNTER — Ambulatory Visit (HOSPITAL_COMMUNITY)
Admission: RE | Admit: 2012-09-29 | Discharge: 2012-09-29 | Disposition: A | Discharge status: Home / Self Care | Payer: 59 | Source: Ambulatory Visit | Attending: Interventional Cardiology | Admitting: Interventional Cardiology

## 2012-09-29 DIAGNOSIS — E785 Hyperlipidemia, unspecified: Secondary | ICD-10-CM

## 2012-09-29 DIAGNOSIS — M79609 Pain in unspecified limb: Secondary | ICD-10-CM

## 2012-09-29 DIAGNOSIS — M79606 Pain in leg, unspecified: Secondary | ICD-10-CM

## 2012-09-29 DIAGNOSIS — I10 Essential (primary) hypertension: Secondary | ICD-10-CM

## 2012-09-29 DIAGNOSIS — R29898 Other symptoms and signs involving the musculoskeletal system: Secondary | ICD-10-CM

## 2012-09-29 DIAGNOSIS — I739 Peripheral vascular disease, unspecified: Secondary | ICD-10-CM

## 2012-09-29 NOTE — Progress Notes (Signed)
VASCULAR LAB PRELIMINARY  ARTERIAL  ABI completed:    RIGHT    LEFT    PRESSURE WAVEFORM  PRESSURE WAVEFORM  BRACHIAL 140 Triphasic BRACHIAL 136 Triphasic  DP 154 Triphasic DP 150 Triphasic         PT 151 Triphasic PT 164 Triphasic                  RIGHT LEFT  ABI 1.10 1.17   ABIs and Doppler waveforms are within normal limits. Duplex scan revealed triphasic waveforms throughout bilaterally. There were diffuse areas of minute heterogeneous plaque noted bilaterally with no evidence of stenosis. Normal lower arterial study.  Maeryn Mcgath, RVS 09/29/2012, 12:22 PM

## 2012-10-06 ENCOUNTER — Encounter (HOSPITAL_COMMUNITY)
Admission: RE | Admit: 2012-10-06 | Discharge: 2012-10-06 | Disposition: A | Discharge status: Home / Self Care | Payer: 59 | Source: Ambulatory Visit | Attending: Interventional Cardiology | Admitting: Interventional Cardiology

## 2012-10-06 ENCOUNTER — Other Ambulatory Visit: Payer: Self-pay

## 2012-10-06 DIAGNOSIS — I2510 Atherosclerotic heart disease of native coronary artery without angina pectoris: Secondary | ICD-10-CM | POA: Insufficient documentation

## 2012-10-06 MED ORDER — TECHNETIUM TC 99M SESTAMIBI GENERIC - CARDIOLITE
30.0000 | Freq: Once | INTRAVENOUS | Status: AC | PRN
  Administered 2012-10-06: 30 via INTRAVENOUS

## 2012-10-06 MED ORDER — TECHNETIUM TC 99M SESTAMIBI GENERIC - CARDIOLITE
10.0000 | Freq: Once | INTRAVENOUS | Status: AC | PRN
  Administered 2012-10-06: 10 via INTRAVENOUS

## 2012-10-06 MED ORDER — REGADENOSON 0.4 MG/5ML IV SOLN
INTRAVENOUS | Status: AC
  Filled 2012-10-06: qty 5

## 2013-04-14 ENCOUNTER — Encounter: Payer: Self-pay | Admitting: Family Medicine

## 2013-04-14 ENCOUNTER — Ambulatory Visit (INDEPENDENT_AMBULATORY_CARE_PROVIDER_SITE_OTHER): Payer: 59 | Admitting: Family Medicine

## 2013-04-14 VITALS — BP 154/79 | HR 63 | Temp 98.6°F | Ht 66.5 in | Wt 212.0 lb

## 2013-04-14 DIAGNOSIS — R21 Rash and other nonspecific skin eruption: Secondary | ICD-10-CM | POA: Insufficient documentation

## 2013-04-14 MED ORDER — PREDNISONE 50 MG PO TABS: ORAL_TABLET | ORAL | Status: DC

## 2013-04-14 NOTE — Assessment & Plan Note (Addendum)
Etiology unclear but likely allergic reaction.  Prednisone 50 x 5 days. Risks benefits discussed Continue home benadryl May consider longer course of prednisone if not resolving  Pt to get rid of current car soap. OTC hydrocortisone cream PRN

## 2013-04-14 NOTE — Progress Notes (Signed)
Joshua [name] is a 72 y.o. male who presents to [facility] today for rash  Rash: onset 7 days ago. Started on finger and hands and traveled up arms and also showed up on legs. Very puritic.  9 days ago pt washed cars outside. Used a new car washing soap. No other recent changes in soaps/detergents, animal exposures. Following morming came in w/ itchy hands in the morning. Spray benadryl at that time w/ benefit. PO benadryl w/ mild benefit. No h/o skin irritation and eczema. Denies fever, n/v/d/c, easy bruising, wt loss. H/o skin cancer. Denies tick bites or recent travel into the woods or outside the state.   The following portions of the patient's history were reviewed and updated as appropriate: allergies, current medications, past medical history, family and social history, and problem list.  Patient is a nonsmoker.  Past Medical History  Diagnosis Date  . Hyperlipidemia   . Hypertension   . GERD (gastroesophageal reflux disease)     ROS as above otherwise neg.    Medications reviewed. Current Outpatient Prescriptions  Medication Sig Dispense Refill  . atenolol-chlorthalidone (TENORETIC) 100-25 MG per tablet TAKE 1 TABLET BY MOUTH DAILY.  90 tablet  PRN  . diclofenac (VOLTAREN) 75 MG EC tablet Take 75 mg by mouth 2 (two) times daily. As directed       . esomeprazole (NEXIUM) 20 MG capsule Take 1 capsule (20 mg total) by mouth daily.  30 capsule  12  . Guaifenesin (MUCINEX MAXIMUM STRENGTH) 1200 MG TB12 Take 1 tablet (1,200 mg total) by mouth every 12 (twelve) hours as needed.  14 tablet  1  . VYTORIN 10-80 MG per tablet TAKE 1 TABLET BY MOUTH DAILY.  90 tablet  PRN   No current facility-administered medications for this visit.    Exam: BP 154/79  Pulse 63  Temp(Src) 98.6 F (37 C) (Oral)  Ht 5' 6.5" (1.689 m)  Wt 212 lb (96.163 kg)  BMI 33.71 kg/m2 Gen: Well NAD HEENT: EOMI,  MMM Lungs: Nl WOB Heart: RRR no MRG SKin: mild macular small rash on hands and arms and legs w/ areas  of more papular eruptions that are itchy Exts: Non edematous BL  LE, warm and well perfused.   No results found for this or any previous visit (from the past 72 hour(s)).

## 2013-04-14 NOTE — Patient Instructions (Signed)
It was a pleasure meeting you today. You are likely having an allergic reaction ot something Please start taking the prednisone and continue taking your benadryl If your symptoms do not improve in 7 days please call back as you may need a skin biopsy or a prolonged course of prednisone. You may also try using hydrocortisone cream for relief  Allergic Reaction, Mild to Moderate Allergies may happen from anything your body is sensitive to. This may be food, medications, pollens, chemicals, and nearly anything around you in everyday life that produces allergens. An allergen is anything that causes an allergy producing substance. Allergens cause your body to release allergic antibodies. Through a chain of events, they cause a release of histamine into the blood stream. Histamines are meant to protect you, but they also cause your discomfort. This is why antihistamines are often used for allergies. Heredity is often a factor in causing allergic reactions. This means you may have some of the same allergies as your parents. Allergies happen in all age groups. You may have some idea of what caused your reaction. There are many allergens around [place]. It may be difficult to know what caused your reaction. If this is a first time event, it may never happen again. Allergies cannot be cured but can be controlled with medications. SYMPTOMS  You may get some or all of the following problems from allergies.  Swelling and itching in and around the mouth.   Tearing, itchy eyes.   Nasal congestion and runny nose.   Sneezing and coughing.   An itchy red rash or hives.   Vomiting or diarrhea.   Difficulty breathing.  Seasonal allergies occur in all age groups. They are seasonal because they usually occur during the same season every year. They may be a reaction to molds, grass pollens, or tree pollens. Other causes of allergies are house dust mite allergens, pet dander and mold spores. These are just a common few  of the thousands of allergens around [place]. All of the symptoms listed above happen when you come in contact with pollens and other allergens. Seasonal allergies are usually not life threatening. They are generally more of a nuisance that can often be handled using medications. Hay fever is a combination of all or some of the above listed allergy problems. It may often be treated with simple over-the-counter medications such as diphenhydramine. Take medication as directed. Check with your caregiver or package insert for child dosages. TREATMENT AND HOME CARE INSTRUCTIONS If hives or rash are present:  Take medications as directed.   You may use an over-the-counter antihistamine (diphenhydramine) for hives and itching as needed. Do not drive or drink alcohol until medications used to treat the reaction have worn off. Antihistamines tend to make people sleepy.   Apply cold cloths (compresses) to the skin or take baths in cool water. This will help itching. Avoid hot baths or showers. Heat will make a rash and itching worse.   If your allergies persist and become more severe, and over the counter medications are not effective, there are many new medications your caretaker can prescribe. Immunotherapy or desensitizing injections can be used if all else fails. Follow up with your caregiver if problems continue.  SEEK MEDICAL CARE IF:   Your allergies are becoming progressively more troublesome.   You suspect a food allergy. Symptoms generally happen within 30 minutes of eating a food.   Your symptoms have not gone away within 2 days or are getting worse.  You develop new symptoms.   You want to retest yourself or your child with a food or drink you think causes an allergic reaction. Never test yourself or your child of a suspected allergy without being under the watchful eye of your caregivers. A second exposure to an allergen may be life-threatening.  SEEK IMMEDIATE MEDICAL CARE IF:  You develop  difficulty breathing or wheezing, or have a tight feeling in your chest or throat.   You develop a swollen mouth, hives, swelling, or itching all over your body.  A severe reaction with any of the above problems should be considered life-threatening. If you suddenly develop difficulty breathing call for local emergency medical help. THIS IS AN EMERGENCY. MAKE SURE YOU:   Understand these instructions.   Will watch your condition.   Will get help right away if you are not doing well or get worse.  Document Released: 09/14/2007 Document Revised: 11/06/2011 Document Reviewed: 09/14/2007 [facility] Patient Information 2012 [place], [place].

## 2013-04-20 ENCOUNTER — Telehealth: Payer: Self-pay | Admitting: Family Medicine

## 2013-04-20 DIAGNOSIS — R21 Rash and other nonspecific skin eruption: Secondary | ICD-10-CM

## 2013-04-20 MED ORDER — PREDNISONE 50 MG PO TABS: ORAL_TABLET | ORAL | Status: DC

## 2013-04-20 NOTE — Telephone Encounter (Signed)
Will FWD to MD.  Roxene Alviar L, CMA  

## 2013-04-20 NOTE — Telephone Encounter (Signed)
Dear [facility] Team [name] will do an additional 5 days---if still having issues then he needs to be seen again [facility]! Joshua [name]

## 2013-04-20 NOTE — Telephone Encounter (Signed)
Pt's wife notified.  Coraima Tibbs, Darlyne Russian, CMA

## 2013-04-20 NOTE — Telephone Encounter (Signed)
Wife called to report pt has gotten some relief from the itching. Would like to have another round of predisone if Dr Konrad Dolores thinks this is appropriate.] Please advise

## 2013-05-03 ENCOUNTER — Ambulatory Visit (INDEPENDENT_AMBULATORY_CARE_PROVIDER_SITE_OTHER): Payer: 59 | Admitting: Family Medicine

## 2013-05-03 ENCOUNTER — Encounter: Payer: Self-pay | Admitting: Family Medicine

## 2013-05-03 VITALS — BP 134/76 | HR 61 | Temp 97.9°F | Ht 66.5 in | Wt 210.0 lb

## 2013-05-03 DIAGNOSIS — R21 Rash and other nonspecific skin eruption: Secondary | ICD-10-CM

## 2013-05-03 NOTE — Assessment & Plan Note (Signed)
Allergic reaction, possibly to Pantoprazole. He will stop it and take Ranitidine 75 mg twice daily plus Cetirazine which his wife has on hand. If the itching resolves and GERD recurs, he will try Omeprazole. Doubt photo reaction to Chlorthalidone because also on legs which don't get much sun.

## 2013-05-03 NOTE — Progress Notes (Signed)
  Subjective:    Patient ID: Joshua [name], male    DOB: 06-05-1941, 72 y.o.   MRN: [id number]  Rash Associated symptoms include coughing. Pertinent negatives include no shortness of breath.   After completing his second course of prednisone, he is again having pruritis, worse on the extremities, with a few papules, but primarily redness where he scratches himself. Benadryl helps, but he has nocturia x 3 and slight hesitancy and will see a urologist soon about concern for prostate CA in his family. The itching seems better at night.  He also has a tickle cough productive of clear phlegm.   His Nexium was switched by the pharmacy to Protonix the end of January. His cardiology PA increased it to 2 - 20 mg tablets daily due to chest pain, and the itching seemed to start around then, so he decreased it to one daily yesterday.    Review of Systems  Respiratory: Positive for cough. Negative for shortness of breath.   Cardiovascular: Negative for chest pain and leg swelling.  Genitourinary: Positive for dysuria. Negative for discharge.       Had a couple days of tingling at beginning of urination, better yesterday  Skin: Positive for rash.       Objective:   Physical Exam  HENT:  Mouth/Throat: Oropharynx is clear and moist.  Eyes: Conjunctivae are normal.  Cardiovascular: Normal rate and regular rhythm.   No murmur heard. Pulmonary/Chest: Effort normal and breath sounds normal. He has no wheezes. He has no rales.  Skin:  A few excoriated papules on arms and legs. Red lines where he scratches himself, but not urticaria.  Psychiatric: He has a normal mood and affect. His behavior is normal. Thought content normal.          Assessment & Plan:

## 2013-05-03 NOTE — Patient Instructions (Addendum)
Stop taking the Protonix and take Ranitidine 75 mg twice daily. If the heartburn comes back, take Omeprazole 20 mg daily in its place.   Let us know if the itching resolves of the Protonix and if it comes back if you need to take Omeprazole.   Instead of Benadryl take Zyrtec for itching.   Take note if sun exposed areas are the itching areas and we'll have to consider Chlorthalidone as a cause.

## 2013-05-08 ENCOUNTER — Encounter: Payer: Self-pay | Admitting: Family Medicine

## 2013-05-13 ENCOUNTER — Ambulatory Visit (INDEPENDENT_AMBULATORY_CARE_PROVIDER_SITE_OTHER): Payer: 59 | Admitting: Family Medicine

## 2013-05-13 ENCOUNTER — Encounter: Payer: Self-pay | Admitting: Family Medicine

## 2013-05-13 VITALS — BP 136/72 | HR 62 | Temp 98.7°F | Ht 66.5 in | Wt 215.5 lb

## 2013-05-13 DIAGNOSIS — R21 Rash and other nonspecific skin eruption: Secondary | ICD-10-CM

## 2013-05-13 NOTE — Patient Instructions (Addendum)
Try to keep soaps to a minimum. Stay on the cetrizine regularly - at least for two weeks after the itching goes away. Add back the zantac in one week if still itching.  It is a different kind of antihistamine.   Try not to scratch.  My records indicate you are due for the shingles vaccine (zostavax).  Ask Dr. Jennette Kettle next time you see her.

## 2013-05-13 NOTE — Assessment & Plan Note (Signed)
More just chronic pruritis than rash.  Take zyrtec regularly.  Consider add xantac (stay on Nexium) as H2 blocker.

## 2013-05-13 NOTE — Progress Notes (Signed)
  Subjective:    Patient ID: Joshua [name], male    DOB: 1941/07/23, 72 y.o.   MRN: [id number]  HPI Diffuse itching without rash for several months.  Change from protonix to nexium did not help.  Tried on [place] alone and GERD symptoms recurred. Has been thoughtful about soaps and detergents.       Review of Systems     Objective:   Physical Exam No rash, dry skin       Assessment & Plan:

## 2013-06-06 ENCOUNTER — Other Ambulatory Visit: Payer: Self-pay | Admitting: Family Medicine

## 2013-09-15 ENCOUNTER — Other Ambulatory Visit: Payer: Self-pay | Admitting: Family Medicine

## 2013-10-06 ENCOUNTER — Other Ambulatory Visit: Payer: Self-pay

## 2014-04-20 ENCOUNTER — Ambulatory Visit (INDEPENDENT_AMBULATORY_CARE_PROVIDER_SITE_OTHER): Payer: Medicare Other | Admitting: Emergency Medicine

## 2014-04-20 VITALS — BP 132/76 | HR 60 | Temp 98.1°F | Resp 16 | Ht 66.0 in | Wt 215.2 lb

## 2014-04-20 DIAGNOSIS — I2510 Atherosclerotic heart disease of native coronary artery without angina pectoris: Secondary | ICD-10-CM

## 2014-04-20 DIAGNOSIS — J309 Allergic rhinitis, unspecified: Secondary | ICD-10-CM

## 2014-04-20 MED ORDER — TRIAMCINOLONE ACETONIDE 55 MCG/ACT NA AERO: 2.0000 | INHALATION_SPRAY | Freq: Every day | NASAL | Status: DC

## 2014-04-20 MED ORDER — FEXOFENADINE HCL 180 MG PO TABS: 180.0000 mg | ORAL_TABLET | Freq: Every day | ORAL | Status: DC

## 2014-04-20 NOTE — Patient Instructions (Signed)

## 2014-04-20 NOTE — Progress Notes (Signed)
[facility] [address], [place] [address] [phone]  Date:  04/20/2014   Name:  Joshua [name]   DOB:  06/17/1941   MRN:  [id number]  PCP:  Dorcas Mcmurray, MD    Chief Complaint: URI   History of Present Illness:  Joshua [name] is a 73 y.o. very pleasant male patient who presents with the following:  3 week history of nasal congestion and mucoid drainage and post nasal drip.  Has a cough productive of mucoid sputum.  No wheezing or shortness of breath.  No fever or chills, no nausea or vomiting.  No rash or stool change.  Has tried a number or OTC medications with no improvement.   Denies other complaint or health concern today.   Patient Active Problem List   Diagnosis Date Noted  . Rash and nonspecific skin eruption 04/14/2013  . WRIST PAIN, LEFT 11/04/2010  . Retinal hemorrhage 07/23/2010  . Impotence of organic origin 01/16/2010  . HYPERTENSION, BENIGN ESSENTIAL 04/18/2009  . HYPERLIPIDEMIA 01/28/2007  . CORONARY, ARTERIOSCLEROSIS 01/28/2007  . HERNIA, HIATAL, NONCONGENITAL 01/28/2007  . ACTINIC DERMATITIS 01/28/2007    Past Medical History  Diagnosis Date  . Hyperlipidemia   . Hypertension   . GERD (gastroesophageal reflux disease)     Past Surgical History  Procedure Laterality Date  . Coronary artery bypass graft  1993    4V  . Coronary stent placement      History  Substance Use Topics  . Smoking status: Never Smoker   . Smokeless tobacco: Not on file  . Alcohol Use: No    Family History  Problem Relation Age of Onset  . Cancer Mother     ovarian  . Hyperlipidemia Son   . Hypertension Son     Allergies  Allergen Reactions  . Ace Inhibitors     REACTION: cough  . Atorvastatin   . Rosuvastatin   . Sulfonamide Derivatives     Medication list has been reviewed and updated.  Current Outpatient Prescriptions on File Prior to Visit  Medication Sig Dispense Refill  . aspirin 325 MG tablet Take 325 mg by mouth  daily.      Marland Kitchen atenolol-chlorthalidone (TENORETIC) 100-25 MG per tablet TAKE 1 TABLET BY MOUTH DAILY.  90 tablet  3  . Guaifenesin (MUCINEX MAXIMUM STRENGTH) 1200 MG TB12 Take 1 tablet (1,200 mg total) by mouth every 12 (twelve) hours as needed.  14 tablet  1  . VYTORIN 10-80 MG per tablet TAKE 1 TABLET BY MOUTH DAILY.  90 tablet  PRN  . diclofenac (VOLTAREN) 75 MG EC tablet Take 75 mg by mouth 2 (two) times daily. As directed       . ranitidine (ZANTAC) 75 MG tablet Take 150 mg by mouth 2 (two) times daily.       No current facility-administered medications on file prior to visit.    Review of Systems:  As per HPI, otherwise negative.    Physical Examination: Filed Vitals:   04/20/14 0853  BP: 132/76  Pulse: 60  Temp: 98.1 F (36.7 C)  Resp: 16   Filed Vitals:   04/20/14 0853  Height: 5\' 6"  (1.676 m)  Weight: 215 lb 3.2 oz (97.614 kg)   Body mass index is 34.75 kg/(m^2). Ideal Body Weight: Weight in (lb) to have BMI = 25: 154.6  GEN: obese, NAD, Non-toxic, A & O x 3 HEENT: Atraumatic, Normocephalic. Neck supple. No masses, No LAD. Ears and Nose:  No external deformity. CV: RRR, No M/G/R. No JVD. No thrill. No extra heart sounds. PULM: CTA B, no wheezes, crackles, rhonchi. No retractions. No resp. distress. No accessory muscle use. ABD: S, NT, ND, +BS. No rebound. No HSM. EXTR: No c/c/e NEURO Normal gait.  PSYCH: Normally interactive. Conversant. Not depressed or anxious appearing.  Calm demeanor.    Assessment and Plan: Seasonal allergic rhinitis nasacort Alleg[place]  Signed,  Ellison Carwin, MD

## 2014-05-16 ENCOUNTER — Encounter: Payer: Self-pay | Admitting: *Deleted

## 2014-06-14 ENCOUNTER — Encounter: Payer: Self-pay | Admitting: Family Medicine

## 2014-06-14 ENCOUNTER — Ambulatory Visit (INDEPENDENT_AMBULATORY_CARE_PROVIDER_SITE_OTHER): Payer: Medicare Other | Admitting: Family Medicine

## 2014-06-14 ENCOUNTER — Other Ambulatory Visit: Payer: Self-pay | Admitting: Family Medicine

## 2014-06-14 VITALS — BP 131/62 | HR 60 | Temp 98.2°F | Ht 66.0 in | Wt 216.6 lb

## 2014-06-14 DIAGNOSIS — E785 Hyperlipidemia, unspecified: Secondary | ICD-10-CM

## 2014-06-14 DIAGNOSIS — I10 Essential (primary) hypertension: Secondary | ICD-10-CM

## 2014-06-14 DIAGNOSIS — L821 Other seborrheic keratosis: Secondary | ICD-10-CM

## 2014-06-14 DIAGNOSIS — Z23 Encounter for immunization: Secondary | ICD-10-CM

## 2014-06-14 DIAGNOSIS — I2510 Atherosclerotic heart disease of native coronary artery without angina pectoris: Secondary | ICD-10-CM

## 2014-06-14 DIAGNOSIS — Z125 Encounter for screening for malignant neoplasm of prostate: Secondary | ICD-10-CM | POA: Insufficient documentation

## 2014-06-14 LAB — COMPREHENSIVE METABOLIC PANEL
ALBUMIN: 4.2 g/dL (ref 3.5–5.2)
ALT: 50 U/L (ref 0–53)
AST: 69 U/L — ABNORMAL HIGH (ref 0–37)
Alkaline Phosphatase: 52 U/L (ref 39–117)
BUN: 17 mg/dL (ref 6–23)
CALCIUM: 9.3 mg/dL (ref 8.4–10.5)
CHLORIDE: 100 meq/L (ref 96–112)
CO2: 28 mEq/L (ref 19–32)
CREATININE: 0.94 mg/dL (ref 0.50–1.35)
GLUCOSE: 97 mg/dL (ref 70–99)
POTASSIUM: 3.7 meq/L (ref 3.5–5.3)
Sodium: 139 mEq/L (ref 135–145)
Total Bilirubin: 0.6 mg/dL (ref 0.2–1.2)
Total Protein: 6.5 g/dL (ref 6.0–8.3)

## 2014-06-14 MED ORDER — EZETIMIBE-SIMVASTATIN 10-40 MG PO TABS: 1.0000 | ORAL_TABLET | Freq: Every day | ORAL | Status: DC

## 2014-06-14 NOTE — Patient Instructions (Signed)
We are checking your LDl cholesterol today and I am changing your cholesterol medicine to the 10/40 from the 10/80. Start the new rx (lower dose) and then come in for a lab in about 3 months (come fasting, lab slip is already there) and we will recheck your cholesterol on new dose. Maybe this will help your muscle cramps.

## 2014-06-15 ENCOUNTER — Encounter: Payer: Self-pay | Admitting: Family Medicine

## 2014-06-15 LAB — PSA, MEDICARE: PSA: 1.24 ng/mL (ref <=4.00)

## 2014-06-16 DIAGNOSIS — L821 Other seborrheic keratosis: Secondary | ICD-10-CM | POA: Insufficient documentation

## 2014-06-16 MED ORDER — ATENOLOL-CHLORTHALIDONE 100-25 MG PO TABS: ORAL_TABLET | ORAL | Status: DC

## 2014-06-16 NOTE — Assessment & Plan Note (Signed)
We'll check PSA today

## 2014-06-16 NOTE — Progress Notes (Signed)
   Subjective:    Patient ID: Joshua [name], male    DOB: Feb 21, 1941, 73 y.o.   MRN: [id number]  HPI  Here for well adult check up. Only issue he is having is some mild fatigue that he thinks is consistent with his age. There's been no sudden increase in this just over the last few years she's had less energy. His wife recently retired and has had him remodeling part of their home and he says he realizes he does not have energy he did 10 years ago. He continues to sleep well and wake refreshed. #2. He does have a lot of muscle cramping. Various places, 4-6/10. Does not seem to be associated with activity. It comes and goes. Crampy kind of muscle aches. Does not have any chest pain. #3. History of coronary disease status post  Review of Systems  Constitutional: Positive for fatigue. Negative for activity change, appetite change and unexpected weight change.  HENT: Positive for congestion. Negative for trouble swallowing.   Respiratory: Negative for chest tightness, shortness of breath and wheezing.        A few weeks go is having some cough and went to urgent care. This was resolved when he went back on his Flonase and allergy medicine. Still has occasional intermittent cough but much improved, nonproductive, not associated with exertion. Not daily.  Cardiovascular: Negative for chest pain, palpitations and leg swelling.  Gastrointestinal: Negative for nausea, abdominal pain, diarrhea, constipation and blood in stool.  Genitourinary: Negative for dysuria, urgency and decreased urine volume.  Musculoskeletal: Positive for myalgias. Negative for arthralgias and joint swelling.  Skin: Negative for rash.  Allergic/Immunologic: Positive for environmental allergies. Negative for immunocompromised state.  Neurological: Negative for dizziness, speech difficulty, weakness, light-headedness and headaches.  Psychiatric/Behavioral: Negative for hallucinations, behavioral problems, confusion, sleep  disturbance, dysphoric mood, decreased concentration and agitation. The patient is not nervous/anxious.        Objective:   Physical Exam  Constitutional: He appears well-developed and well-nourished.  HENT:  Head: Normocephalic and atraumatic.  Right Ear: External ear normal.  Left Ear: External ear normal.  Nose: Nose normal.  Mouth/Throat: Oropharynx is clear and moist.  Eyes: Conjunctivae and EOM are normal. Pupils are equal, round, and reactive to light. No scleral icterus.  Neck: Normal range of motion. Neck supple. No JVD present. No thyromegaly present.  Cardiovascular: Normal rate, regular rhythm, normal heart sounds and intact distal pulses.  Exam reveals no gallop and no friction rub.   No murmur heard. Pulmonary/Chest: Effort normal and breath sounds normal. No respiratory distress. He has no wheezes. He has no rales.  Abdominal: He exhibits no distension. There is no tenderness. There is no rebound.  Musculoskeletal: Normal range of motion. He exhibits no edema and no tenderness.  Lymphadenopathy:    He has no cervical adenopathy.  Neurological: He is alert. He has normal reflexes. He exhibits normal muscle tone. Coordination normal.  Skin: No rash noted.  Several normal appearing seborrheic keratoses, once he was concerned about her on his back. Classic SKs without any atypical features. Some skin tags on his right neck but these are sessile and appear normal  Psychiatric: He has a normal mood and affect. His behavior is normal. Thought content normal.          Assessment & Plan:

## 2014-06-16 NOTE — Assessment & Plan Note (Signed)
Blood pressure is extremely well controlled. We'll continue current medication and check kidney function and blood sugar today.

## 2014-06-16 NOTE — Assessment & Plan Note (Signed)
He is asymptomatic. His blood pressure is well controlled. He is having some myalgias and has been on Vytorin with 80 mg of simvastatin. I will decrease his simvastatin component of the Vytorin to 40 mg and recheck his cholesterol in 2 months. We discussed this at length. Continue his current blood pressure medicine. He is taking an aspirin a day.

## 2014-06-16 NOTE — Assessment & Plan Note (Signed)
Change his Vytorin from 80 mg of the simvastatin to 40 mg of simvastatin and recheck lipid panel in 2 months. This may help his myalgias.

## 2014-09-15 ENCOUNTER — Other Ambulatory Visit: Payer: Self-pay

## 2014-12-14 ENCOUNTER — Other Ambulatory Visit: Payer: Medicare Other

## 2014-12-14 DIAGNOSIS — E785 Hyperlipidemia, unspecified: Secondary | ICD-10-CM

## 2014-12-14 LAB — LIPID PANEL
Cholesterol: 139 mg/dL (ref 0–200)
HDL: 25 mg/dL — ABNORMAL LOW (ref >39)
LDL CALC: 68 mg/dL (ref 0–99)
TRIGLYCERIDES: 231 mg/dL — AB (ref <150)
Total CHOL/HDL Ratio: 5.6 Ratio
VLDL: 46 mg/dL — ABNORMAL HIGH (ref 0–40)

## 2014-12-14 NOTE — Progress Notes (Signed)
Drew fasting Lipid profile (was due in Sept 2015)

## 2014-12-19 ENCOUNTER — Encounter: Payer: Self-pay | Admitting: Family Medicine

## 2015-03-21 ENCOUNTER — Ambulatory Visit (HOSPITAL_COMMUNITY)
Admission: RE | Admit: 2015-03-21 | Discharge: 2015-03-21 | Disposition: A | Discharge status: Home / Self Care | Payer: Medicare Other | Source: Ambulatory Visit | Attending: Family Medicine | Admitting: Family Medicine

## 2015-03-21 ENCOUNTER — Ambulatory Visit (INDEPENDENT_AMBULATORY_CARE_PROVIDER_SITE_OTHER): Payer: Medicare Other | Admitting: Family Medicine

## 2015-03-21 ENCOUNTER — Encounter: Payer: Self-pay | Admitting: Family Medicine

## 2015-03-21 VITALS — BP 138/79 | HR 62 | Temp 98.1°F | Ht 66.0 in | Wt 218.0 lb

## 2015-03-21 DIAGNOSIS — M542 Cervicalgia: Secondary | ICD-10-CM

## 2015-03-21 DIAGNOSIS — M8580 Other specified disorders of bone density and structure, unspecified site: Secondary | ICD-10-CM | POA: Diagnosis not present

## 2015-03-21 DIAGNOSIS — M8589 Other specified disorders of bone density and structure, multiple sites: Secondary | ICD-10-CM | POA: Diagnosis not present

## 2015-03-21 DIAGNOSIS — E785 Hyperlipidemia, unspecified: Secondary | ICD-10-CM | POA: Diagnosis not present

## 2015-03-21 NOTE — Patient Instructions (Signed)
I have placed an order for some neck x rays at the hospital. I will send you a note about those. I have also placed a referral for physical therapy---you should hear from them in next few days. Great to see you!

## 2015-03-21 NOTE — Progress Notes (Signed)
   Subjective:    Patient ID: Joshua [name]., male    DOB: [birth year], 74 y.o.   MRN: [id number]  HPI 4-6 weeks of stiffness in his neck particularly on the left side. He has had no specific injury. Stiffness bothers him mostly when he is trying to drive and he has difficulty looking back over his shoulder. It also is bothering him throughout the day with some neck pain that is at most 4 out of 10. It is mostly left-sided and posterior. Sometimes it aches down into his left trapezius muscle. He has never had a problem with this before. #2. Regarding myalgias in general. We had changed his simvastatin down to a 40 mg dose and he is tolerating that much better. Says she's not having nearly as much aches and pains in his legs and arm muscles.  Review of Systems No problems swallowing, no shortness of breath that is different from his baseline exertional shortness of breath. No fever, sweats, chills.    Objective:   Physical Exam Vital signs are reviewed GEN.: Well-developed male no acute distress NECK: Lateral rotation to the left decreased by about 10 compared with the right. Also has some stiffness in that particular planes of motion. Full flexion. Extension is a little stiff but full. Cervical vertebra are nontender to palpation. There is some muscle spasm noted in the posterior neck muscles and over the left trapezius. MSK: Upper extremity strength 5 out of 5. Next  NEURO: DTRs 2+ bilaterally equal elbow and forearm. Intact sensation soft touch bilateral upper extremities       Assessment & Plan:  Neck pain and stiffness. This seems most consistent with osteoarthritis and chronic muscle spasm. I will get some x-rays to make sure were not dealing with something unusual. I think he would benefit from some physical therapy particularly to get home exercise program set up and I have put the referral in for that. He doesn't hear from them within a week he'll call me. If this does not improve  his muscle and joint pain in his neck, he'll follow-up with me in 4-6 weeks. He'll also follow-up if he gets any worse or has new symptoms.

## 2015-03-21 NOTE — Assessment & Plan Note (Signed)
Decreased myalgias on simvastatin 40 mg. We'll continue that. LDL last visit was 68.

## 2015-03-28 ENCOUNTER — Encounter: Payer: Self-pay | Admitting: Family Medicine

## 2015-04-04 ENCOUNTER — Ambulatory Visit: Payer: Medicare Other

## 2015-04-04 ENCOUNTER — Ambulatory Visit: Payer: Medicare Other | Attending: Family Medicine | Admitting: Physical Therapy

## 2015-04-04 ENCOUNTER — Ambulatory Visit: Payer: Medicare Other | Admitting: Rehabilitation

## 2015-04-04 DIAGNOSIS — R293 Abnormal posture: Secondary | ICD-10-CM

## 2015-04-04 DIAGNOSIS — R29898 Other symptoms and signs involving the musculoskeletal system: Secondary | ICD-10-CM | POA: Diagnosis not present

## 2015-04-04 DIAGNOSIS — M542 Cervicalgia: Secondary | ICD-10-CM | POA: Insufficient documentation

## 2015-04-04 NOTE — Therapy (Signed)
[address] [place], [place], [address] Phone: [phone]   Fax:  [phone]  Physical Therapy Evaluation  Patient Details  Name: Joshua [name]. MRN: [id number] Date of Birth: 01/30/1941 Referring Provider:  Dickie La, MD  Encounter Date: 04/04/2015      PT End of Session - 04/04/15 1512    Visit Number 1   Date for PT Re-Evaluation 05/16/15   PT Start Time 1445   PT Stop Time 1528   PT Time Calculation (min) 43 min   Activity Tolerance Patient tolerated treatment well   Behavior During Therapy [facility] for tasks assessed/performed      Past Medical History  Diagnosis Date  . Hyperlipidemia   . Hypertension   . GERD (gastroesophageal reflux disease)   . CAD (coronary artery disease)   . Chest pain, unspecified   . Coronary atherosclerosis of native coronary artery     Past Surgical History  Procedure Laterality Date  . Coronary artery bypass graft  1993    4V  . Coronary stent placement      There were no vitals filed for this visit.  Visit Diagnosis:  Neck pain - Plan: PT plan of care cert/re-cert  Upper extremity weakness - Plan: PT plan of care cert/re-cert  Poor posture - Plan: PT plan of care cert/re-cert      Subjective Assessment - 04/04/15 1447    Subjective Pt is a 74 y/o male who presents to OPPT with gradual onset of neck pain x 1 month.  Pt reports pain mostly on L side and intermittent.   How long can you sit comfortably? unlimited   Currently in Pain? Yes   Pain Score 0-No pain  increases to 5/10   Pain Location Neck   Pain Orientation Left;Distal   Pain Descriptors / Indicators Sore   Pain Type --  sub acute   Pain Onset 1 to 4 weeks ago   Pain Frequency Intermittent   Aggravating Factors  turning head to left   Pain Relieving Factors avoiding movement to left            [facility] PT Assessment - 04/04/15 1450    Assessment   Medical Diagnosis neck pain    Onset Date 03/02/15  approx   Next MD Visit PRN   Prior Therapy none   Precautions   Precautions None   Restrictions   Weight Bearing Restrictions No   Balance Screen   Has the patient fallen in the past 6 months No   Has the patient had a decrease in activity level because of a fear of falling?  No   Is the patient reluctant to leave their home because of a fear of falling?  No   Prior Function   Level of Independence Independent with basic ADLs;Independent with gait;Independent with transfers   Vocation Retired   Leisure sedentary lifestyle   Cognition   Overall Cognitive Status Within Functional Limits for tasks assessed   Observation/Other Assessments   Focus on Therapeutic Outcomes (FOTO)  69 (31% limited; predicted 25% limited)   Posture/Postural Control   Posture/Postural Control Postural limitations   Postural Limitations Rounded Shoulders;Forward head;Increased thoracic kyphosis   AROM   AROM Assessment Site Cervical   Cervical Flexion 33   Cervical Extension 50   Cervical - Right Side Bend 35   Cervical - Left Side Bend 21   Cervical - Right Rotation 54   Cervical -  Left Rotation 51   Strength   Strength Assessment Site Shoulder;Elbow   Right Shoulder Flexion 4/5   Right Shoulder ABduction 4/5   Right Shoulder Internal Rotation 5/5   Right Shoulder External Rotation 5/5   Left Shoulder Flexion 4/5   Left Shoulder ABduction 4/5   Left Shoulder Internal Rotation 5/5   Left Shoulder External Rotation 5/5   Right/Left Elbow Right;Left   Right Elbow Flexion 5/5   Right Elbow Extension 5/5   Left Elbow Flexion 5/5   Left Elbow Extension 5/5   Palpation   Palpation tenderness and tightness along L upper trap and L paraspinal muscles   Special Tests    Special Tests Cervical   Cervical Tests Spurling's;Dictraction   Spurling's   Findings Negative   Distraction Test   Findngs Negative                   [facility] Adult PT Treatment/Exercise -  2015/05/01 1450    Modalities   Modalities Electrical Stimulation;Moist Heat   Moist Heat Therapy   Number Minutes Moist Heat 15 Minutes   Moist Heat Location Other (comment)  neck   Electrical Stimulation   Electrical Stimulation Location L neck/UT   Electrical Stimulation Action IFC   Electrical Stimulation Parameters to tolerance x 15 min   Electrical Stimulation Goals Pain                     PT Long Term Goals - 2015-05-01 1514    PT LONG TERM GOAL #1   Title independent with HEP (05/16/15)   Time 6   Period Weeks   Status New   PT LONG TERM GOAL #2   Title verbalize understanding of posture and body mechanics to reduce the risk of reinjury (05/16/15)   Time 6   Period Weeks   Status New   PT LONG TERM GOAL #3   Title cervical ROM increased to WNL without pain (05/16/15)   Time 6   Period Weeks   Status New               Plan - 2015-05-01 1512    Clinical Impression Statement Pt presents to OPPT with 1 month onset of L sided neck pain.  Feel symptoms most consistent with musculoskeletal pain and poor posture.  Pt reports spending most of day sitting and reading kindle.  Will benefit from PT to maximize function and decrease pain and improve postural awareness   Pt will benefit from skilled therapeutic intervention in order to improve on the following deficits Postural dysfunction;Pain;Decreased strength;Decreased range of motion;Decreased activity tolerance   Rehab Potential Good   PT Frequency 2x / week   PT Duration 6 weeks   PT Treatment/Interventions ADLs/Self Care Home Management;Cryotherapy;Electrical Stimulation;Functional mobility training;Neuromuscular re-education;Ultrasound;Manual techniques;Passive range of motion;Therapeutic exercise;Traction;Moist Heat;Therapeutic activities;Patient/family education   PT Next Visit Plan posture/body mechanics; HEP for postural exercises   Consulted and Agree with Plan of Care Patient          G-Codes -  May 01, 2015 1516    Functional Assessment Tool Used FOTO 31% limited   Functional Limitation Carrying, moving and handling objects   Carrying, Moving and Handling Objects Current Status (C7893) At least 20 percent but less than 40 percent impaired, limited or restricted   Carrying, Moving and Handling Objects Goal Status (Y1017) At least 20 percent but less than 40 percent impaired, limited or restricted       Problem List Patient Active Problem  List   Diagnosis Date Noted  . Seborrheic keratoses 06/16/2014  . Special screening for malignant neoplasm of prostate 06/14/2014  . Rash and nonspecific skin eruption 04/14/2013  . WRIST PAIN, LEFT 11/04/2010  . Retinal hemorrhage 07/23/2010  . Impotence of organic origin 01/16/2010  . HYPERTENSION, BENIGN ESSENTIAL 04/18/2009  . Hyperlipidemia 01/28/2007  . CORONARY, ARTERIOSCLEROSIS 01/28/2007  . HERNIA, HIATAL, NONCONGENITAL 01/28/2007   Laureen Abrahams, PT, DPT 04/04/2015 3:29 PM  [address] [place], [place], [address] Phone: [phone]   Fax:  [phone]

## 2015-04-11 ENCOUNTER — Ambulatory Visit: Payer: Medicare Other | Admitting: Rehabilitation

## 2015-04-11 DIAGNOSIS — R293 Abnormal posture: Secondary | ICD-10-CM | POA: Diagnosis not present

## 2015-04-11 DIAGNOSIS — R29898 Other symptoms and signs involving the musculoskeletal system: Secondary | ICD-10-CM | POA: Diagnosis not present

## 2015-04-11 DIAGNOSIS — M542 Cervicalgia: Secondary | ICD-10-CM | POA: Diagnosis not present

## 2015-04-11 NOTE — Therapy (Signed)
[address] [place], [place], [address] Phone: [phone]   Fax:  [phone]  Physical Therapy Treatment  Patient Details  Name: Joshua [name]. MRN: [id number] Date of Birth: 1941-03-13 Referring Provider:  Dickie La, MD  Encounter Date: 04/11/2015      PT End of Session - 04/11/15 0938    Visit Number 2   PT Start Time 0845   PT Stop Time 0945   PT Time Calculation (min) 60 min   Activity Tolerance Patient tolerated treatment well      Past Medical History  Diagnosis Date  . Hyperlipidemia   . Hypertension   . GERD (gastroesophageal reflux disease)   . CAD (coronary artery disease)   . Chest pain, unspecified   . Coronary atherosclerosis of native coronary artery     Past Surgical History  Procedure Laterality Date  . Coronary artery bypass graft  1993    4V  . Coronary stent placement      There were no vitals filed for this visit.  Visit Diagnosis:  Cervicalgia  Neck pain  Poor posture                       [facility] Adult PT Treatment/Exercise - 04/11/15 0001    Exercises   Exercises Neck   Neck Exercises: Standing   Neck Retraction 20 reps   Neck Exercises: Seated   Neck Retraction 20 reps   Other Seated Exercise Scap depression x 10   Modalities   Modalities Ultrasound   Moist Heat Therapy   Number Minutes Moist Heat 15 Minutes   Moist Heat Location Other (comment)   Electrical Stimulation   Electrical Stimulation Location L UT/neck   Electrical Stimulation Action premod x 2 channel   Electrical Stimulation Parameters 10-15 mA   Electrical Stimulation Goals Pain   Ultrasound   Ultrasound Location L UT/neck   Ultrasound Parameters 1 Mhz x 1.5 w/cm2 x 10'   Manual Therapy   Manual Therapy Myofascial release   Myofascial Release L UT trigger pt rls                PT Education - 04/11/15 0937    Education provided Yes   Education  Details HEP initiated:  cervical retract, scap depression, R SB stretch   Person(s) Educated Patient   Methods Demonstration;Handout   Comprehension Returned demonstration             PT Long Term Goals - 04/04/15 1514    PT LONG TERM GOAL #1   Title independent with HEP (05/16/15)   Time 6   Period Weeks   Status New   PT LONG TERM GOAL #2   Title verbalize understanding of posture and body mechanics to reduce the risk of reinjury (05/16/15)   Time 6   Period Weeks   Status New   PT LONG TERM GOAL #3   Title cervical ROM increased to WNL without pain (05/16/15)   Time 6   Period Weeks   Status New               Plan - 04/11/15 0272    Clinical Impression Statement Pt. states HP/IFC helped.   C/o more of L UT/levator pain than L neck.   Palpable trigger points with + TP jump sign.   TP rls works well today        Problem List Patient Active  Problem List   Diagnosis Date Noted  . Seborrheic keratoses 06/16/2014  . Special screening for malignant neoplasm of prostate 06/14/2014  . Rash and nonspecific skin eruption 04/14/2013  . WRIST PAIN, LEFT 11/04/2010  . Retinal hemorrhage 07/23/2010  . Impotence of organic origin 01/16/2010  . HYPERTENSION, BENIGN ESSENTIAL 04/18/2009  . Hyperlipidemia 01/28/2007  . CORONARY, ARTERIOSCLEROSIS 01/28/2007  . HERNIA, HIATAL, NONCONGENITAL 01/28/2007    Volney American, PT 04/11/2015, 9:56 AM  [address] [place], [place], [address] Phone: [phone]   Fax:  [phone]

## 2015-04-13 ENCOUNTER — Ambulatory Visit: Payer: Medicare Other | Admitting: Physical Therapy

## 2015-04-13 ENCOUNTER — Encounter: Payer: Self-pay | Admitting: Physical Therapy

## 2015-04-13 DIAGNOSIS — M542 Cervicalgia: Secondary | ICD-10-CM

## 2015-04-13 DIAGNOSIS — R29898 Other symptoms and signs involving the musculoskeletal system: Secondary | ICD-10-CM | POA: Diagnosis not present

## 2015-04-13 DIAGNOSIS — R293 Abnormal posture: Secondary | ICD-10-CM | POA: Diagnosis not present

## 2015-04-13 NOTE — Therapy (Signed)
[address] [place], [place], [address] Phone: [phone]   Fax:  [phone]  Physical Therapy Treatment  Patient Details  Name: Joshua [name]. MRN: [id number] Date of Birth: [birth year] Referring Provider:  Dickie La, MD  Encounter Date: 04/13/2015      PT End of Session - 04/13/15 0912    Visit Number 3   Date for PT Re-Evaluation 05/16/15   PT Start Time 0830   PT Stop Time 0925   PT Time Calculation (min) 55 min      Past Medical History  Diagnosis Date  . Hyperlipidemia   . Hypertension   . GERD (gastroesophageal reflux disease)   . CAD (coronary artery disease)   . Chest pain, unspecified   . Coronary atherosclerosis of native coronary artery     Past Surgical History  Procedure Laterality Date  . Coronary artery bypass graft  1993    4V  . Coronary stent placement      There were no vitals filed for this visit.  Visit Diagnosis:  Neck pain      Subjective Assessment - 04/13/15 0832    Subjective about the same, when last therapist mashed around it hurt but helped   Currently in Pain? Yes   Pain Score 4    Pain Location Scapula   Pain Orientation Left                         [facility] Adult PT Treatment/Exercise - 04/13/15 0001    Neck Exercises: Machines for Strengthening   UBE (Upper Arm Bike) 72fwd/2back L2   Neck Exercises: Theraband   Other Theraband Exercises yellow tband 3 way 15 times   Neck Exercises: Standing   Other Standing Exercises ball vs wall 5 times CC and CW   Other Standing Exercises 5# shruggs and backward rolls 15   Modalities   Modalities --  combo   Moist Heat Therapy   Number Minutes Moist Heat 10 Minutes   Moist Heat Location Cervical  left UT   Electrical Stimulation   Electrical Stimulation Location L UT/neck   Ultrasound   Ultrasound Location Left UT/neck   Ultrasound Parameters 1 mhz 1 .2 w/cm 2 10 min   Manual  Therapy   Manual Therapy Myofascial release;Soft tissue mobilization   Myofascial Release Left UT trigger pt release  left UT stretching                     PT Long Term Goals - 04/13/15 0912    PT LONG TERM GOAL #1   Title independent with HEP (05/16/15)   Status On-going   PT LONG TERM GOAL #2   Title verbalize understanding of posture and body mechanics to reduce the risk of reinjury (05/16/15)   Status On-going   PT LONG TERM GOAL #3   Title cervical ROM increased to WNL without pain (05/16/15)   Status On-going               Plan - 04/13/15 0912    Clinical Impression Statement pt very tight in bilateral cerv/Ut and rhim, ut only c/o pain on Left. Tolerated MT well and trigger point release helped. Combo to Left UT with good relief. Pt needs VCing with ther ex for posture   PT Next Visit Plan posture/body mechanics; HEP for postural exercises        Problem List  Patient Active Problem List   Diagnosis Date Noted  . Seborrheic keratoses 06/16/2014  . Special screening for malignant neoplasm of prostate 06/14/2014  . Rash and nonspecific skin eruption 04/14/2013  . WRIST PAIN, LEFT 11/04/2010  . Retinal hemorrhage 07/23/2010  . Impotence of organic origin 01/16/2010  . HYPERTENSION, BENIGN ESSENTIAL 04/18/2009  . Hyperlipidemia 01/28/2007  . CORONARY, ARTERIOSCLEROSIS 01/28/2007  . HERNIA, HIATAL, NONCONGENITAL 01/28/2007    Ephriam Turman,ANGIE PTA 04/13/2015, 9:15 AM  [address] [place], [place], [address] Phone: [phone]   Fax:  [phone]

## 2015-04-18 ENCOUNTER — Ambulatory Visit: Payer: Medicare Other | Admitting: Rehabilitation

## 2015-04-18 DIAGNOSIS — M542 Cervicalgia: Secondary | ICD-10-CM | POA: Diagnosis not present

## 2015-04-18 DIAGNOSIS — R29898 Other symptoms and signs involving the musculoskeletal system: Secondary | ICD-10-CM | POA: Diagnosis not present

## 2015-04-18 DIAGNOSIS — R293 Abnormal posture: Secondary | ICD-10-CM | POA: Diagnosis not present

## 2015-04-18 NOTE — Therapy (Signed)
[address] [place], [place], [address] Phone: [phone]   Fax:  [phone]  Physical Therapy Treatment  Patient Details  Name: Joshua [name]. MRN: [id number] Date of Birth: Mar 19, 1941 Referring Provider:  Dickie La, MD  Encounter Date: 04/18/2015      PT End of Session - 04/18/15 1228    Visit Number 4   PT Start Time 0845   PT Stop Time 0945   PT Time Calculation (min) 60 min      Past Medical History  Diagnosis Date  . Hyperlipidemia   . Hypertension   . GERD (gastroesophageal reflux disease)   . CAD (coronary artery disease)   . Chest pain, unspecified   . Coronary atherosclerosis of native coronary artery     Past Surgical History  Procedure Laterality Date  . Coronary artery bypass graft  1993    4V  . Coronary stent placement      There were no vitals filed for this visit.  Visit Diagnosis:  Neck pain  Poor posture      Subjective Assessment - 04/18/15 1227    Subjective states pain is more midline C-spine now, and not nearly as bad into the upper trap                         [facility] Adult PT Treatment/Exercise - 04/18/15 0001    Neck Exercises: Theraband   Other Theraband Exercises supine scap depression x 20   Neck Exercises: Standing   Neck Retraction 20 reps   Neck Retraction Limitations supine   Other Standing Exercises R cervical sidebending x 1' x 3   Other Standing Exercises L levator stretch x 1' x 3 with man OP   Neck Exercises: Seated   Neck Retraction 20 reps   Moist Heat Therapy   Number Minutes Moist Heat 15 Minutes   Moist Heat Location Cervical   Electrical Stimulation   Electrical Stimulation Location L UT/neck   Electrical Stimulation Action pre mod x 2 channel   Electrical Stimulation Parameters to tolerance   Electrical Stimulation Goals Pain   Ultrasound   Ultrasound Location Combo L neck/UT x 10;   Ultrasound  Parameters 1 mhz x 1.5 w/cm2 x 120 mV x 10'   Ultrasound Goals Pain   Manual Therapy   Manual Therapy Joint mobilization;Myofascial release   Joint Mobilization grade 3-4 C2-7 extension/sideglides/bilat rotation x 10 ea   Myofascial Release L UT/levator/suboccipital                PT Education - 04/18/15 1228    Education Details L levator stretch    Person(s) Educated Patient   Methods Demonstration   Comprehension Returned demonstration             PT Long Term Goals - 04/13/15 0912    PT LONG TERM GOAL #1   Title independent with HEP (05/16/15)   Status On-going   PT LONG TERM GOAL #2   Title verbalize understanding of posture and body mechanics to reduce the risk of reinjury (05/16/15)   Status On-going   PT LONG TERM GOAL #3   Title cervical ROM increased to WNL without pain (05/16/15)   Status On-going               Plan - 04/18/15 1229    Clinical Impression Statement L UT trigger point no longer palpable with min pain  with pressure.   More painful over L levator and paracervicals.    Still restricted with extension and needs lots of suboccipital work   PT Next Visit Plan try tennis ball or foam roll suboccipital self massage, possibly traction, add gym tband ex postural        Problem List Patient Active Problem List   Diagnosis Date Noted  . Seborrheic keratoses 06/16/2014  . Special screening for malignant neoplasm of prostate 06/14/2014  . Rash and nonspecific skin eruption 04/14/2013  . WRIST PAIN, LEFT 11/04/2010  . Retinal hemorrhage 07/23/2010  . Impotence of organic origin 01/16/2010  . HYPERTENSION, BENIGN ESSENTIAL 04/18/2009  . Hyperlipidemia 01/28/2007  . CORONARY, ARTERIOSCLEROSIS 01/28/2007  . HERNIA, HIATAL, NONCONGENITAL 01/28/2007    Volney American, PT  04/18/2015, 12:32 PM  [address] [address] [place], [place], [address] Phone: [phone]   Fax:   [phone]

## 2015-04-20 ENCOUNTER — Ambulatory Visit: Payer: Medicare Other | Admitting: Physical Therapy

## 2015-04-20 ENCOUNTER — Encounter: Payer: Self-pay | Admitting: Physical Therapy

## 2015-04-20 DIAGNOSIS — R29898 Other symptoms and signs involving the musculoskeletal system: Secondary | ICD-10-CM | POA: Diagnosis not present

## 2015-04-20 DIAGNOSIS — M542 Cervicalgia: Secondary | ICD-10-CM

## 2015-04-20 DIAGNOSIS — R293 Abnormal posture: Secondary | ICD-10-CM

## 2015-04-20 NOTE — Therapy (Signed)
[address] [place], [place], [address] Phone: [phone]   Fax:  [phone]  Physical Therapy Treatment  Patient Details  Name: Joshua [name]. MRN: [id number] Date of Birth: [birth year] Referring Provider:  Dickie La, MD  Encounter Date: 04/20/2015      PT End of Session - 04/20/15 0903    Visit Number 5   PT Start Time 0840   PT Stop Time 0930   PT Time Calculation (min) 50 min      Past Medical History  Diagnosis Date  . Hyperlipidemia   . Hypertension   . GERD (gastroesophageal reflux disease)   . CAD (coronary artery disease)   . Chest pain, unspecified   . Coronary atherosclerosis of native coronary artery     Past Surgical History  Procedure Laterality Date  . Coronary artery bypass graft  1993    4V  . Coronary stent placement      There were no vitals filed for this visit.  Visit Diagnosis:  Poor posture  Cervicalgia      Subjective Assessment - 04/20/15 0839    Subjective 60% better, pain more in center of neck   Currently in Pain? Yes   Pain Score 3    Pain Location Neck            [facility] PT Assessment - 04/20/15 0001    AROM   Cervical Flexion WFL   Cervical Extension WFL   Cervical - Right Side Bend decrease 25%   Cervical - Left Side Bend decreased 25%   Cervical - Right Rotation decreased 25%   Cervical - Left Rotation decreased 25%                     [facility] Adult PT Treatment/Exercise - 04/20/15 0001    Neck Exercises: Machines for Strengthening   UBE (Upper Arm Bike) L 3 3 fwd/3 back   Cybex Row 20# 2 sets 15   Other Machines for Strengthening lat pull 20 # 2 sets 15   Neck Exercises: Standing   Neck Retraction 15 reps   Neck Retraction Limitations --  standing with ball on wall   Other Standing Exercises 3# UE ther ex with stab of cerv retraction on ball  shld flex,abd,chest press,rotation   Other Standing Exercises standing  facing wall 3 pt rhythmic stab 12 times each   Modalities   Modalities Traction   Electrical Stimulation   Electrical Stimulation Location cerv/trap   Electrical Stimulation Action IFC   Electrical Stimulation Goals Pain;Edema   Traction   Type of Traction Cervical  [facility] home unit   Min (lbs) 12  static                PT Education - 04/20/15 0854    Education Details red tband scap stab   Person(s) Educated Patient   Methods Explanation;Demonstration;Handout   Comprehension Verbalized understanding;Returned demonstration             PT Long Term Goals - 04/20/15 5638    PT LONG TERM GOAL #1   Title independent with HEP (05/16/15)   Status On-going   PT LONG TERM GOAL #2   Title verbalize understanding of posture and body mechanics to reduce the risk of reinjury (05/16/15)   Baseline verb and improving but still not always complying esp with activities like reading   PT LONG TERM GOAL #3   Title cervical  ROM increased to WNL without pain (05/16/15)   Status On-going               Plan - 04/20/15 0904    Clinical Impression Statement pt with improved ROM and decreased pain, pt still with postural weakness and needing VCing for correct posture especially with reading   PT Next Visit Plan postural ther ex, assess use of traction        Problem List Patient Active Problem List   Diagnosis Date Noted  . Seborrheic keratoses 06/16/2014  . Special screening for malignant neoplasm of prostate 06/14/2014  . Rash and nonspecific skin eruption 04/14/2013  . WRIST PAIN, LEFT 11/04/2010  . Retinal hemorrhage 07/23/2010  . Impotence of organic origin 01/16/2010  . HYPERTENSION, BENIGN ESSENTIAL 04/18/2009  . Hyperlipidemia 01/28/2007  . CORONARY, ARTERIOSCLEROSIS 01/28/2007  . HERNIA, HIATAL, NONCONGENITAL 01/28/2007    Krissy Orebaugh,ANGIE PTA 04/20/2015, 9:07 AM  [address]  [place], [place], [address] Phone: [phone]   Fax:  [phone]

## 2015-04-24 ENCOUNTER — Ambulatory Visit: Payer: Medicare Other | Admitting: Physical Therapy

## 2015-04-24 ENCOUNTER — Encounter: Payer: Self-pay | Admitting: Physical Therapy

## 2015-04-24 DIAGNOSIS — M542 Cervicalgia: Secondary | ICD-10-CM

## 2015-04-24 DIAGNOSIS — R293 Abnormal posture: Secondary | ICD-10-CM

## 2015-04-24 DIAGNOSIS — R29898 Other symptoms and signs involving the musculoskeletal system: Secondary | ICD-10-CM | POA: Diagnosis not present

## 2015-04-24 NOTE — Therapy (Signed)
[address] [place], [place], [address] Phone: [phone]   Fax:  [phone]  Physical Therapy Treatment  Patient Details  Name: Joshua [name]. MRN: [id number] Date of Birth: 1941-09-17 Referring Provider:  Dickie La, MD  Encounter Date: 04/24/2015      PT End of Session - 04/24/15 1046    Visit Number 6   PT Start Time 6767   PT Stop Time 1105   PT Time Calculation (min) 50 min      Past Medical History  Diagnosis Date  . Hyperlipidemia   . Hypertension   . GERD (gastroesophageal reflux disease)   . CAD (coronary artery disease)   . Chest pain, unspecified   . Coronary atherosclerosis of native coronary artery     Past Surgical History  Procedure Laterality Date  . Coronary artery bypass graft  1993    4V  . Coronary stent placement      There were no vitals filed for this visit.  Visit Diagnosis:  Poor posture  Cervicalgia      Subjective Assessment - 04/24/15 1018    Pain Frequency Intermittent   Aggravating Factors  certain mvmt esp to Left                         [facility] Adult PT Treatment/Exercise - 04/24/15 0001    Neck Exercises: Machines for Strengthening   UBE (Upper Arm Bike) L 3 3 fwd/3 back   Cybex Row 20# 2 sets 15   Other Machines for Strengthening lat pull 20 # 2 sets 15   Neck Exercises: Standing   Neck Retraction 15 reps   Other Standing Exercises 3# UE ther ex with stab of cerv retraction on ball  shld flex,abd,chest press,rotation, wall angel   Other Standing Exercises seated 3 # row and ext   Electrical Stimulation   Electrical Stimulation Location cerv/trap/rhomboid   Electrical Stimulation Action IFC   Electrical Stimulation Goals Pain   Manual Therapy   Manual Therapy Soft tissue mobilization   Myofascial Release L UT and rhomboid TP release                     PT Long Term Goals - 04/20/15 2094    PT LONG  TERM GOAL #1   Title independent with HEP (05/16/15)   Status On-going   PT LONG TERM GOAL #2   Title verbalize understanding of posture and body mechanics to reduce the risk of reinjury (05/16/15)   Baseline verb and improving but still not always complying esp with activities like reading   PT LONG TERM GOAL #3   Title cervical ROM increased to WNL without pain (05/16/15)   Status On-going               Plan - 04/24/15 1046    Clinical Impression Statement continuing to work on postural strengthening and VCing still required with ther ex. large trigger point in Left rhomboid causing pain, better after MT and modalities   PT Next Visit Plan assess goals        Problem List Patient Active Problem List   Diagnosis Date Noted  . Seborrheic keratoses 06/16/2014  . Special screening for malignant neoplasm of prostate 06/14/2014  . Rash and nonspecific skin eruption 04/14/2013  . WRIST PAIN, LEFT 11/04/2010  . Retinal hemorrhage 07/23/2010  . Impotence of organic origin 01/16/2010  .  HYPERTENSION, BENIGN ESSENTIAL 04/18/2009  . Hyperlipidemia 01/28/2007  . CORONARY, ARTERIOSCLEROSIS 01/28/2007  . HERNIA, HIATAL, NONCONGENITAL 01/28/2007    PAYSEUR,ANGIE PTA 04/24/2015, 10:48 AM  [address] [place], [place], [address] Phone: [phone]   Fax:  [phone]

## 2015-04-26 ENCOUNTER — Ambulatory Visit: Payer: Medicare Other | Admitting: Physical Therapy

## 2015-04-26 ENCOUNTER — Encounter: Payer: Self-pay | Admitting: Physical Therapy

## 2015-04-26 DIAGNOSIS — M542 Cervicalgia: Secondary | ICD-10-CM | POA: Diagnosis not present

## 2015-04-26 DIAGNOSIS — R29898 Other symptoms and signs involving the musculoskeletal system: Secondary | ICD-10-CM | POA: Diagnosis not present

## 2015-04-26 DIAGNOSIS — R293 Abnormal posture: Secondary | ICD-10-CM | POA: Diagnosis not present

## 2015-04-26 NOTE — Therapy (Signed)
[address] [place], [place], [address] Phone: [phone]   Fax:  [phone]  Physical Therapy Treatment  Patient Details  Name: Joshua [name]. MRN: [id number] Date of Birth: 07-03-1941 Referring Provider:  Dickie La, MD  Encounter Date: 04/26/2015      PT End of Session - 04/26/15 1051    Visit Number 7   Date for PT Re-Evaluation 05/16/15   PT Start Time 9678   PT Stop Time 1105   PT Time Calculation (min) 50 min      Past Medical History  Diagnosis Date  . Hyperlipidemia   . Hypertension   . GERD (gastroesophageal reflux disease)   . CAD (coronary artery disease)   . Chest pain, unspecified   . Coronary atherosclerosis of native coronary artery     Past Surgical History  Procedure Laterality Date  . Coronary artery bypass graft  1993    4V  . Coronary stent placement      There were no vitals filed for this visit.  Visit Diagnosis:  Cervicalgia      Subjective Assessment - 04/26/15 1015    Subjective helped someone move and reaggravated area   Currently in Pain? Yes   Pain Score 4    Pain Location Neck                         [facility] Adult PT Treatment/Exercise - 04/26/15 0001    Neck Exercises: Machines for Strengthening   UBE (Upper Arm Bike) L 3 3 fwd/3 back   Cybex Row 20# 2 sets 15   Other Machines for Strengthening lat pull 20 # 2 sets 15   Neck Exercises: Standing   Other Standing Exercises 6# shruggs, rolls, ext and bent over row 2 sets 10   Moist Heat Therapy   Number Minutes Moist Heat 10 Minutes   Moist Heat Location Cervical   Ultrasound   Ultrasound Location Combo Left UT 10 min   Ultrasound Parameters 103mhz 1.5 w/cm2   Ultrasound Goals Pain   Manual Therapy   Manual Therapy Soft tissue mobilization   Myofascial Release L UT and rhomboid TP release                     PT Long Term Goals - 04/26/15 1023    PT LONG  TERM GOAL #1   Title independent with HEP (05/16/15)   Status On-going   PT LONG TERM GOAL #2   Title verbalize understanding of posture and body mechanics to reduce the risk of reinjury (05/16/15)   Status On-going   PT LONG TERM GOAL #3   Title cervical ROM increased to WNL without pain (05/16/15)   Status On-going               Plan - 04/26/15 1051    Clinical Impression Statement pt with decreased trigger pt in Left rhomboid after combo treatment. pt with improved strength but requires VCing for postural awareness        Problem List Patient Active Problem List   Diagnosis Date Noted  . Seborrheic keratoses 06/16/2014  . Special screening for malignant neoplasm of prostate 06/14/2014  . Rash and nonspecific skin eruption 04/14/2013  . WRIST PAIN, LEFT 11/04/2010  . Retinal hemorrhage 07/23/2010  . Impotence of organic origin 01/16/2010  . HYPERTENSION, BENIGN ESSENTIAL 04/18/2009  . Hyperlipidemia 01/28/2007  . CORONARY, ARTERIOSCLEROSIS  01/28/2007  . HERNIA, HIATAL, NONCONGENITAL 01/28/2007    PAYSEUR,ANGIE PTA  04/26/2015, 10:53 AM  [address] [place], [place], [address] Phone: [phone]   Fax:  [phone]

## 2015-05-01 ENCOUNTER — Ambulatory Visit: Payer: Medicare Other | Admitting: Physical Therapy

## 2015-05-01 NOTE — Therapy (Signed)
[address] [place], [place], [address] Phone: [phone]   Fax:  [phone]  Physical Therapy Treatment  Patient Details  Name: Joshua [name]. MRN: [id number] Date of Birth: 11-17-1941 Referring Provider:  Dickie La, MD  Encounter Date: 05/01/2015    Past Medical History  Diagnosis Date  . Hyperlipidemia   . Hypertension   . GERD (gastroesophageal reflux disease)   . CAD (coronary artery disease)   . Chest pain, unspecified   . Coronary atherosclerosis of native coronary artery     Past Surgical History  Procedure Laterality Date  . Coronary artery bypass graft  1993    4V  . Coronary stent placement      There were no vitals filed for this visit.  Visit Diagnosis:  Neck pain      Subjective Assessment - 05/01/15 1322    Currently in Pain? Yes   Pain Score 5    Pain Location Neck   Pain Orientation Left                                      PT Long Term Goals - 04/26/15 1023    PT LONG TERM GOAL #1   Title independent with HEP (05/16/15)   Status On-going   PT LONG TERM GOAL #2   Title verbalize understanding of posture and body mechanics to reduce the risk of reinjury (05/16/15)   Status On-going   PT LONG TERM GOAL #3   Title cervical ROM increased to WNL without pain (05/16/15)   Status On-going               Plan - 05/01/15 1322    Clinical Impression Statement Pt initailly got good relief with PT modalities and STW with trigger point release to Left trap and rhomboids. Pt also educated on postural strengthening. Now pt has minimal changes in past 3 sessions. Pts symptoms are soft tissue related and feel he would benefit from dry needling.   PT Next Visit Plan DRY NEEDLING        Problem List Patient Active Problem List   Diagnosis Date Noted  . Seborrheic keratoses 06/16/2014  . Special screening for malignant neoplasm of  prostate 06/14/2014  . Rash and nonspecific skin eruption 04/14/2013  . WRIST PAIN, LEFT 11/04/2010  . Retinal hemorrhage 07/23/2010  . Impotence of organic origin 01/16/2010  . HYPERTENSION, BENIGN ESSENTIAL 04/18/2009  . Hyperlipidemia 01/28/2007  . CORONARY, ARTERIOSCLEROSIS 01/28/2007  . HERNIA, HIATAL, NONCONGENITAL 01/28/2007    Terissa Haffey,ANGIE PTA 05/01/2015, 1:27 PM  [address] [place], [place], [address] Phone: [phone]   Fax:  [phone]

## 2015-05-03 ENCOUNTER — Ambulatory Visit: Payer: Medicare Other | Attending: Family Medicine | Admitting: Physical Therapy

## 2015-05-03 DIAGNOSIS — R293 Abnormal posture: Secondary | ICD-10-CM | POA: Diagnosis not present

## 2015-05-03 DIAGNOSIS — M542 Cervicalgia: Secondary | ICD-10-CM

## 2015-05-03 DIAGNOSIS — H2513 Age-related nuclear cataract, bilateral: Secondary | ICD-10-CM | POA: Diagnosis not present

## 2015-05-03 DIAGNOSIS — H5213 Myopia, bilateral: Secondary | ICD-10-CM | POA: Diagnosis not present

## 2015-05-03 DIAGNOSIS — Z135 Encounter for screening for eye and ear disorders: Secondary | ICD-10-CM | POA: Diagnosis not present

## 2015-05-03 NOTE — Therapy (Signed)
[place], [place], [address] Phone: [phone]   Fax:  [phone]  Physical Therapy Treatment  Patient Details  Name: Joshua [name]. MRN: [id number] Date of Birth: [birth year] Referring Provider:  Dickie La, MD  Encounter Date: 05/03/2015      PT End of Session - 05/03/15 1036    Visit Number 8   Date for PT Re-Evaluation 05/16/15   Authorization Type U[employer] medicare- G codes   PT Start Time 0845   PT Stop Time 0940   PT Time Calculation (min) 55 min   Activity Tolerance Patient tolerated treatment well      Past Medical History  Diagnosis Date  . Hyperlipidemia   . Hypertension   . GERD (gastroesophageal reflux disease)   . CAD (coronary artery disease)   . Chest pain, unspecified   . Coronary atherosclerosis of native coronary artery     Past Surgical History  Procedure Laterality Date  . Coronary artery bypass graft  1993    4V  . Coronary stent placement      There were no vitals filed for this visit.  Visit Diagnosis:  Neck pain  Cervicalgia  Poor posture      Subjective Assessment - 05/03/15 0848    Subjective Patient reports he continues to have lower cervical pain along upper traps.     Currently in Pain? Yes   Pain Score 6    Pain Location Neck   Pain Orientation Right;Left;Mid   Aggravating Factors  looking down at [facility]; turning to the left                         [facility] Adult PT Treatment/Exercise - 05/03/15 1031    Neck Exercises: Seated   Neck Retraction 5 reps   Other Seated Exercise upper trap and levator scap stretching 20 sec hold 3x right and left with overpressure   Moist Heat Therapy   Number Minutes Moist Heat 10 Minutes   Manual Therapy   Manual Therapy Manual Traction   Manual therapy comments upper trap contract/relax 5x 5 sec holds right/left   Joint Mobilization grade 3-4 C2-7 extension/sideglides/bilat rotation x 10 ea   Myofascial Release bilateral upper trap and levator soft tissue mobilization   Manual Traction 3x 30 sec          Trigger Point Dry Needling - 05/03/15 1033    Consent Given? Yes   Education Handout Provided Yes  including signs/symptoms of pneumothorax   Muscles Treated Upper Body Upper trapezius;Levator scapulae   Upper Trapezius Response Twitch reponse elicited;Palpable increased muscle length   Levator Scapulae Response Twitch response elicited;Palpable increased muscle length       Performed bilaterally.       PT Education - 05/03/15 [phone]    Education Details seated cervical retractions; stretching review of upper traps and levator as previously issued in HEP; trigger point dry needling self care   Person(s) Educated Patient             PT Long Term Goals - 05/03/15 1044    PT LONG TERM GOAL #1   Title independent with HEP (05/16/15)   Time 6   Period Weeks   Status On-going   PT LONG TERM GOAL #2   Title verbalize understanding of posture and body mechanics to reduce the risk of reinjury (05/16/15)   Time 6   Period Weeks   Status On-going  PT LONG TERM GOAL #3   Title cervical ROM increased to WNL without pain (05/16/15)   Time 6   Period Weeks   Status On-going               Plan - 05/03/15 1037    Clinical Impression Statement The patient presents with complaint of mid lower cervical pain in upper trap and levator scap muscles.  He is receptive to trying dry needling for myofascial pain/trigger points in these muscles.  Noted improved muscle length following.  Encouraged home compliance with stretching and postural correction for optimal benefits.  Therapist closely monitoring pain response with all interventions.  Will follow up in 1 week to assess progress and continued treatments.   PT Next Visit Plan assess response to dry needling; recheck cervical AROM and progress toward goals; Check visits for G-code        Problem List Patient  Active Problem List   Diagnosis Date Noted  . Seborrheic keratoses 06/16/2014  . Special screening for malignant neoplasm of prostate 06/14/2014  . Rash and nonspecific skin eruption 04/14/2013  . WRIST PAIN, LEFT 11/04/2010  . Retinal hemorrhage 07/23/2010  . Impotence of organic origin 01/16/2010  . HYPERTENSION, BENIGN ESSENTIAL 04/18/2009  . Hyperlipidemia 01/28/2007  . CORONARY, ARTERIOSCLEROSIS 01/28/2007  . HERNIA, HIATAL, NONCONGENITAL 01/28/2007    Alvera Singh 05/03/2015, 10:48 AM  [facility] [address] [place], [place], [address] Phone: [phone]   Fax:  [phone]    Ruben Im, PT 05/03/2015 10:49 AM Phone: [phone] Fax: [phone]

## 2015-05-03 NOTE — Patient Instructions (Signed)
  Copyright  VHI. All rights reserved.  Flexibility: Neck Retraction   Pull head straight back, keeping eyes and jaw level. Repeat __8__ times per set. Do __1__ sets per session. Do __3__ sessions per day.  http://orth.exer.us/344   Copyright  VHI. All rights reserved.   Trigger Point Dry Needling  . What is Trigger Point Dry Needling (DN)? o DN is a physical therapy technique used to treat muscle pain and dysfunction. Specifically, DN helps deactivate muscle trigger points (muscle knots).  o A thin filiform needle is used to penetrate the skin and stimulate the underlying trigger point. The goal is for a local twitch response (LTR) to occur and for the trigger point to relax. No medication of any kind is injected during the procedure.   . What Does Trigger Point Dry Needling Feel Like?  o The procedure feels different for each individual patient. Some patients report that they do not actually feel the needle enter the skin and overall the process is not painful. Very mild bleeding may occur. However, many patients feel a deep cramping in the muscle in which the needle was inserted. This is the local twitch response.   Marland Kitchen How Will I feel after the treatment? o Soreness is normal, and the onset of soreness may not occur for a few hours. Typically this soreness does not last longer than two days.  o Bruising is uncommon, however; ice can be used to decrease any possible bruising.  o In rare cases feeling tired or nauseous after the treatment is normal. In addition, your symptoms may get worse before they get better, this period will typically not last longer than 24 hours.   . What Can I do After My Treatment? o Increase your hydration by drinking more water for the next 24 hours. o You may place ice or heat on the areas treated that have become sore, however, do not use heat on inflamed or bruised areas. Heat often brings more relief post needling. o You can continue your regular  activities, but vigorous activity is not recommended initially after the treatment for 24 hours. o DN is best combined with other physical therapy such as strengthening, stretching, and other therapies.

## 2015-05-04 ENCOUNTER — Ambulatory Visit: Payer: Medicare Other | Admitting: Physical Therapy

## 2015-05-15 ENCOUNTER — Ambulatory Visit: Payer: Medicare Other | Admitting: Physical Therapy

## 2015-05-15 DIAGNOSIS — M542 Cervicalgia: Secondary | ICD-10-CM | POA: Diagnosis not present

## 2015-05-15 DIAGNOSIS — R293 Abnormal posture: Secondary | ICD-10-CM | POA: Diagnosis not present

## 2015-05-15 NOTE — Therapy (Addendum)
[place], [place], [address] Phone: [phone]   Fax:  [phone]  Physical Therapy Treatment/Recertification  Patient Details  Name: Joshua [name]. MRN: [id number] Date of Birth: [birth year] Referring Provider:  Dickie La, MD  Encounter Date: 05/15/2015      PT End of Session - 05/15/15 1441    Visit Number 10   Date for PT Re-Evaluation 06/26/15   Authorization Type UHC medicare- G codes   PT Start Time 5284   PT Stop Time 1440   PT Time Calculation (min) 72 min   Activity Tolerance Patient tolerated treatment well      Past Medical History  Diagnosis Date  . Hyperlipidemia   . Hypertension   . GERD (gastroesophageal reflux disease)   . CAD (coronary artery disease)   . Chest pain, unspecified   . Coronary atherosclerosis of native coronary artery     Past Surgical History  Procedure Laterality Date  . Coronary artery bypass graft  1993    4V  . Coronary stent placement      There were no vitals filed for this visit.  Visit Diagnosis:  Neck pain - Plan: PT plan of care cert/re-cert  Cervicalgia - Plan: PT plan of care cert/re-cert  Poor posture - Plan: PT plan of care cert/re-cert      Subjective Assessment - 05/15/15 1326    Subjective Had 3 goods days following last session but then the pain came back.  Left upper traps.  No longer having difficulty reading Kindle.     Currently in Pain? Yes   Pain Score 5    Pain Location Neck   Pain Orientation Left   Aggravating Factors  left sidebending            [facility] PT Assessment - 05/15/15 0001    AROM   Cervical Flexion 58   Cervical Extension 46   Cervical - Right Side Bend 43   Cervical - Left Side Bend 38   Cervical - Right Rotation 40   Cervical - Left Rotation 40                     [facility] Adult PT Treatment/Exercise - 05/15/15 1429    Neck Exercises: Seated   Cervical Rotation Both;5 reps   3 finger sweep   Other Seated Exercise self rotation mob with movement with towel 5x R/L   Moist Heat Therapy   Number Minutes Moist Heat 10 Minutes   Moist Heat Location Cervical  left   Manual Therapy   Joint Mobilization grade 3-4 C2-7 extension/sideglides/bilat rotation x 10 ea   Myofascial Release left upper trap, levator scap, rhomboids   Manual Traction 3x 30 sec          Trigger Point Dry Needling - 05/15/15 1440    Consent Given? --  discussed risks and signs/symptoms of pneumothorax   Education Handout Provided No   Muscles Treated Upper Body Upper trapezius;Levator scapulae;Rhomboids   Upper Trapezius Response Palpable increased muscle length   Levator Scapulae Response Palpable increased muscle length   Rhomboids Response Palpable increased muscle length      Left side only.        PT Education - 05/15/15 1441    Education provided Yes   Education Details rotation cervical on 3 fingers and with towel self mob with movement   Person(s) Educated Patient   Methods Explanation;Demonstration;Handout   Comprehension Verbalized understanding;Returned demonstration  PT Long Term Goals - 05-18-15 1449    PT LONG TERM GOAL #1   Title independent with HEP (05/16/15)   Time 6   Period Weeks   Status On-going   PT LONG TERM GOAL #2   Title verbalize understanding of posture and body mechanics to reduce the risk of reinjury (05/16/15)   Time 6   Period Weeks   Status Partially Met   PT LONG TERM GOAL #3   Title cervical ROM increased to WNL without pain (05/16/15)   Time 6   Period Weeks   Status On-going   PT LONG TERM GOAL #4   Title Patient will report overall improvement in pain and function with ADLs at > 50%.   Time 6   Period Weeks   Status New               Plan - 05-18-2015 1444    Clinical Impression Statement The patient has shown some progress with cervical AROM although still limited with bilateral rotation.  He showed  minimal improvement with previous ther ex and manual techniques and was referred last visit for trial of dry needling in conjuction with these interventions for relief of myofascial trigger and tender points.  He reports last visit 3 days of relief before return of symptoms.  Patient with improved muscle length following treatment to left only rhomboids, levator scap and upper traps.  Partial goals met.  Should meet remaining goals in 3-4 visits.     PT Next Visit Plan assess response to left dry needling and manual techniques.  check carryover with cervical rotation HEP;  do FOTO next visit.          G-Codes - 2015-05-18 1452    Functional Assessment Tool Used clinical judgement; FOTO to be done next visit   Functional Limitation Carrying, moving and handling objects   Carrying, Moving and Handling Objects Current Status [phone]) At least 20 percent but less than 40 percent impaired, limited or restricted   Carrying, Moving and Handling Objects Goal Status (Q3300) At least 20 percent but less than 40 percent impaired, limited or restricted      Problem List Patient Active Problem List   Diagnosis Date Noted  . Seborrheic keratoses 06/16/2014  . Special screening for malignant neoplasm of prostate 06/14/2014  . Rash and nonspecific skin eruption 04/14/2013  . WRIST PAIN, LEFT 11/04/2010  . Retinal hemorrhage 07/23/2010  . Impotence of organic origin 01/16/2010  . HYPERTENSION, BENIGN ESSENTIAL 04/18/2009  . Hyperlipidemia 01/28/2007  . CORONARY, ARTERIOSCLEROSIS 01/28/2007  . HERNIA, HIATAL, NONCONGENITAL 01/28/2007    Alvera Singh 05/18/15, 3:02 PM  [facility] [address] [place], [place], [address] Phone: [phone]   Fax:  [phone]  Ruben Im, PT May 18, 2015 3:03 PM Phone: [phone] Fax: [phone]   PHYSICAL THERAPY DISCHARGE SUMMARY  Visits from Start of Care: 10  Current functional level related  to goals / functional outcomes: The patient called to cancel his remaining appointments secondary to "doing better."  Called patient back and his wife reports that [name] continues to have some pain but is able to self manage his myofascial pain by using a tennis ball against the wall for direct pressure.     Remaining deficits: See above   Education / Equipment: HEP Plan: Patient agrees to discharge.  Patient goals were partially met. Patient is being discharged due to the patient's request.  ?????  G code Carrying moving objects 20-40  [place] discharge status  Ruben Im, PT 06/08/2015 7:42 AM Phone: [phone] Fax: [phone]

## 2015-06-07 ENCOUNTER — Ambulatory Visit: Payer: Medicare Other | Admitting: Physical Therapy

## 2015-06-07 NOTE — Therapy (Signed)
[place], [place], [address] Phone: [phone]   Fax:  [phone]  Physical Therapy Treatment  Patient Details  Name: Joshua [name]. MRN: [id number] Date of Birth: June 26, 1941 Referring Provider:  Dickie La, MD  Encounter Date: 05/15/2015    Past Medical History  Diagnosis Date  . Hyperlipidemia   . Hypertension   . GERD (gastroesophageal reflux disease)   . CAD (coronary artery disease)   . Chest pain, unspecified   . Coronary atherosclerosis of native coronary artery     Past Surgical History  Procedure Laterality Date  . Coronary artery bypass graft  1993    4V  . Coronary stent placement      There were no vitals filed for this visit.  Visit Diagnosis:  Neck pain - Plan: PT plan of care cert/re-cert  Cervicalgia - Plan: PT plan of care cert/re-cert  Poor posture - Plan: PT plan of care cert/re-cert                                    PT Long Term Goals - 05/15/15 1449    PT LONG TERM GOAL #1   Title independent with HEP (05/16/15)   Time 6   Period Weeks   Status On-going   PT LONG TERM GOAL #2   Title verbalize understanding of posture and body mechanics to reduce the risk of reinjury (05/16/15)   Time 6   Period Weeks   Status Partially Met   PT LONG TERM GOAL #3   Title cervical ROM increased to WNL without pain (05/16/15)   Time 6   Period Weeks   Status On-going   PT LONG TERM GOAL #4   Title Patient will report overall improvement in pain and function with ADLs at > 50%.   Time 6   Period Weeks   Status New               Problem List Patient Active Problem List   Diagnosis Date Noted  . Seborrheic keratoses 06/16/2014  . Special screening for malignant neoplasm of prostate 06/14/2014  . Rash and nonspecific skin eruption 04/14/2013  . WRIST PAIN, LEFT 11/04/2010  . Retinal hemorrhage 07/23/2010  . Impotence of  organic origin 01/16/2010  . HYPERTENSION, BENIGN ESSENTIAL 04/18/2009  . Hyperlipidemia 01/28/2007  . CORONARY, ARTERIOSCLEROSIS 01/28/2007  . HERNIA, HIATAL, NONCONGENITAL 01/28/2007    Alvera Singh 06/07/2015, 3:44 PM  [facility] [address] [place], [place], [address] Phone: [phone]   Fax:  [phone]

## 2015-06-14 ENCOUNTER — Ambulatory Visit: Payer: Medicare Other | Admitting: Physical Therapy

## 2015-06-14 ENCOUNTER — Other Ambulatory Visit: Payer: Self-pay | Admitting: Family Medicine

## 2015-06-21 ENCOUNTER — Encounter: Payer: Medicare Other | Admitting: Physical Therapy

## 2015-06-27 DIAGNOSIS — H25813 Combined forms of age-related cataract, bilateral: Secondary | ICD-10-CM | POA: Diagnosis not present

## 2015-06-27 DIAGNOSIS — H02831 Dermatochalasis of right upper eyelid: Secondary | ICD-10-CM | POA: Diagnosis not present

## 2015-06-27 DIAGNOSIS — H43813 Vitreous degeneration, bilateral: Secondary | ICD-10-CM | POA: Diagnosis not present

## 2015-06-27 DIAGNOSIS — H01001 Unspecified blepharitis right upper eyelid: Secondary | ICD-10-CM | POA: Diagnosis not present

## 2015-06-27 DIAGNOSIS — H527 Unspecified disorder of refraction: Secondary | ICD-10-CM | POA: Diagnosis not present

## 2015-06-27 DIAGNOSIS — H52223 Regular astigmatism, bilateral: Secondary | ICD-10-CM | POA: Diagnosis not present

## 2015-07-06 DIAGNOSIS — H02831 Dermatochalasis of right upper eyelid: Secondary | ICD-10-CM | POA: Diagnosis not present

## 2015-07-06 DIAGNOSIS — H52223 Regular astigmatism, bilateral: Secondary | ICD-10-CM | POA: Diagnosis not present

## 2015-07-06 DIAGNOSIS — H25813 Combined forms of age-related cataract, bilateral: Secondary | ICD-10-CM | POA: Diagnosis not present

## 2015-07-10 DIAGNOSIS — E780 Pure hypercholesterolemia: Secondary | ICD-10-CM | POA: Diagnosis not present

## 2015-07-10 DIAGNOSIS — Z79899 Other long term (current) drug therapy: Secondary | ICD-10-CM | POA: Diagnosis not present

## 2015-07-10 DIAGNOSIS — H2511 Age-related nuclear cataract, right eye: Secondary | ICD-10-CM | POA: Diagnosis not present

## 2015-07-10 DIAGNOSIS — Z951 Presence of aortocoronary bypass graft: Secondary | ICD-10-CM | POA: Diagnosis not present

## 2015-07-10 DIAGNOSIS — H52223 Regular astigmatism, bilateral: Secondary | ICD-10-CM | POA: Diagnosis not present

## 2015-07-10 DIAGNOSIS — Z7982 Long term (current) use of aspirin: Secondary | ICD-10-CM | POA: Diagnosis not present

## 2015-07-10 DIAGNOSIS — G43B0 Ophthalmoplegic migraine, not intractable: Secondary | ICD-10-CM | POA: Diagnosis not present

## 2015-07-10 DIAGNOSIS — H25811 Combined forms of age-related cataract, right eye: Secondary | ICD-10-CM | POA: Diagnosis not present

## 2015-07-10 DIAGNOSIS — I10 Essential (primary) hypertension: Secondary | ICD-10-CM | POA: Diagnosis not present

## 2015-07-10 DIAGNOSIS — K219 Gastro-esophageal reflux disease without esophagitis: Secondary | ICD-10-CM | POA: Diagnosis not present

## 2015-07-10 DIAGNOSIS — H43813 Vitreous degeneration, bilateral: Secondary | ICD-10-CM | POA: Diagnosis not present

## 2015-07-10 DIAGNOSIS — I2510 Atherosclerotic heart disease of native coronary artery without angina pectoris: Secondary | ICD-10-CM | POA: Diagnosis not present

## 2015-07-19 DIAGNOSIS — Z951 Presence of aortocoronary bypass graft: Secondary | ICD-10-CM | POA: Diagnosis not present

## 2015-07-19 DIAGNOSIS — I2510 Atherosclerotic heart disease of native coronary artery without angina pectoris: Secondary | ICD-10-CM | POA: Diagnosis not present

## 2015-07-19 DIAGNOSIS — E669 Obesity, unspecified: Secondary | ICD-10-CM | POA: Diagnosis not present

## 2015-07-19 DIAGNOSIS — I10 Essential (primary) hypertension: Secondary | ICD-10-CM | POA: Diagnosis not present

## 2015-07-19 DIAGNOSIS — E785 Hyperlipidemia, unspecified: Secondary | ICD-10-CM | POA: Diagnosis not present

## 2015-07-19 DIAGNOSIS — K219 Gastro-esophageal reflux disease without esophagitis: Secondary | ICD-10-CM | POA: Diagnosis not present

## 2015-07-19 DIAGNOSIS — H25812 Combined forms of age-related cataract, left eye: Secondary | ICD-10-CM | POA: Diagnosis not present

## 2015-07-19 DIAGNOSIS — H2512 Age-related nuclear cataract, left eye: Secondary | ICD-10-CM | POA: Diagnosis not present

## 2015-08-24 DIAGNOSIS — L720 Epidermal cyst: Secondary | ICD-10-CM | POA: Diagnosis not present

## 2015-08-29 DIAGNOSIS — Z961 Presence of intraocular lens: Secondary | ICD-10-CM | POA: Diagnosis not present

## 2015-09-12 ENCOUNTER — Ambulatory Visit: Payer: Medicare Other | Admitting: Family Medicine

## 2015-10-29 ENCOUNTER — Encounter: Payer: Self-pay | Admitting: Family Medicine

## 2015-10-30 MED ORDER — EZETIMIBE 10 MG PO TABS: 10.0000 mg | ORAL_TABLET | Freq: Every day | ORAL | Status: DC

## 2015-10-30 MED ORDER — SIMVASTATIN 40 MG PO TABS: 40.0000 mg | ORAL_TABLET | Freq: Every day | ORAL | Status: DC

## 2015-12-14 MED FILL — ATENOLOL/CHLORTHAL 100/25: 100-25 | 90 days supply | Qty: 90 | Fill #2

## 2015-12-14 MED FILL — EZETIMIBE 10 MG TABLET: 10 | 90 days supply | Qty: 90 | Fill #0

## 2015-12-14 MED FILL — SIMVASTATIN 40 MG TABLET: 40 | 90 days supply | Qty: 90 | Fill #0

## 2016-01-29 ENCOUNTER — Encounter: Payer: Self-pay | Admitting: Family Medicine

## 2016-02-01 ENCOUNTER — Other Ambulatory Visit: Payer: Self-pay | Admitting: Family Medicine

## 2016-02-01 DIAGNOSIS — Z125 Encounter for screening for malignant neoplasm of prostate: Secondary | ICD-10-CM

## 2016-02-16 ENCOUNTER — Telehealth: Payer: Self-pay | Admitting: Family Medicine

## 2016-02-16 DIAGNOSIS — Z20828 Contact with and (suspected) exposure to other viral communicable diseases: Secondary | ICD-10-CM

## 2016-02-16 MED ORDER — OSELTAMIVIR PHOSPHATE 75 MG PO CAPS: 75.0000 mg | ORAL_CAPSULE | Freq: Every day | ORAL | Status: DC

## 2016-02-16 NOTE — Telephone Encounter (Signed)
Paged to [facility] Emergency Line approx 1040 on 02/16/16 by patient, Joshua [name], in reference to her husband, Joshua [name]. Reports concern with exposure to influenza with her granddaughter who stayed at her house last night, granddaughter first started URI / flu-like symptoms with cough and fever x 24-48 hours, this was first contact with her this week, granddaughter shared a bed with grandparents last night due to not feeling well, had fever to 102.81F, the granddaughter went to Urgent Care this morning and confirmed on rapid flu test Influenza Type B positive, treated for flu. Now, Joshua [name] calls to get information on prophylaxis treatment with Tamiflu for both her and her husband, she currently states that he is asymptomatic, afebrile, no cough or congestion, myalgias.  He is considered high risk for flu with age >48, but reassuring no HR diagnosis with COPD/CHF. He did not receive flu shot this season. We discussed risks and benefits of Tamiflu, explaining that if he had the flu it may reduce symptoms by 6 to 24 hours, and can have significant GI side effects, mutual decision to send a prescription to [place], for prophylaxis dose Tamiflu 75mg  daily x 10 days (switch to treatment dose if symptomatic within 24-48 hrs, 75mg  BID x 5 days), he will follow-up on Monday with picking up or leaving rx from pharmacy as indicated and check with [facility] if anything else is needed.  Joshua [name], [place], PGY-3

## 2016-02-18 MED FILL — OSELTAMIVIR PHOS 75 MG CAP: 75 | 10 days supply | Qty: 10 | Fill #0

## 2016-02-27 ENCOUNTER — Other Ambulatory Visit: Payer: PPO

## 2016-02-27 DIAGNOSIS — L570 Actinic keratosis: Secondary | ICD-10-CM | POA: Diagnosis not present

## 2016-02-27 DIAGNOSIS — L814 Other melanin hyperpigmentation: Secondary | ICD-10-CM | POA: Diagnosis not present

## 2016-02-27 DIAGNOSIS — Z125 Encounter for screening for malignant neoplasm of prostate: Secondary | ICD-10-CM

## 2016-02-27 DIAGNOSIS — L821 Other seborrheic keratosis: Secondary | ICD-10-CM | POA: Diagnosis not present

## 2016-02-27 DIAGNOSIS — L578 Other skin changes due to chronic exposure to nonionizing radiation: Secondary | ICD-10-CM | POA: Diagnosis not present

## 2016-02-27 DIAGNOSIS — L308 Other specified dermatitis: Secondary | ICD-10-CM | POA: Diagnosis not present

## 2016-02-27 MED FILL — FLUOROURACIL 5% CREAM: 5 | 15 days supply | Qty: 40 | Fill #0

## 2016-02-27 MED FILL — TRIAMCINOLONE 0.1% CREAM: 0.1 | 15 days supply | Qty: 30 | Fill #0

## 2016-02-28 ENCOUNTER — Encounter: Payer: Self-pay | Admitting: Family Medicine

## 2016-02-28 LAB — PSA, MEDICARE: PSA: 1.09 ng/mL (ref <=4.00)

## 2016-03-10 MED FILL — SIMVASTATIN 40 MG TABLET: 40 | 90 days supply | Qty: 90 | Fill #1

## 2016-03-10 MED FILL — ATENOLOL/CHLORTHAL 100/25: 100-25 | 90 days supply | Qty: 90 | Fill #3

## 2016-03-10 MED FILL — EZETIMIBE 10 MG TABLET: 10 | 90 days supply | Qty: 90 | Fill #1

## 2016-04-16 DIAGNOSIS — L570 Actinic keratosis: Secondary | ICD-10-CM | POA: Diagnosis not present

## 2016-05-14 ENCOUNTER — Ambulatory Visit (INDEPENDENT_AMBULATORY_CARE_PROVIDER_SITE_OTHER): Payer: PPO | Admitting: Family Medicine

## 2016-05-14 VITALS — BP 133/62 | HR 57 | Temp 98.3°F | Ht 66.0 in | Wt 219.6 lb

## 2016-05-14 DIAGNOSIS — Z0000 Encounter for general adult medical examination without abnormal findings: Secondary | ICD-10-CM

## 2016-05-14 DIAGNOSIS — I10 Essential (primary) hypertension: Secondary | ICD-10-CM | POA: Diagnosis not present

## 2016-05-14 DIAGNOSIS — Z0001 Encounter for general adult medical examination with abnormal findings: Secondary | ICD-10-CM | POA: Diagnosis not present

## 2016-05-14 DIAGNOSIS — I25810 Atherosclerosis of coronary artery bypass graft(s) without angina pectoris: Secondary | ICD-10-CM | POA: Diagnosis not present

## 2016-05-14 DIAGNOSIS — R251 Tremor, unspecified: Secondary | ICD-10-CM | POA: Diagnosis not present

## 2016-05-14 DIAGNOSIS — G2581 Restless legs syndrome: Secondary | ICD-10-CM

## 2016-05-14 DIAGNOSIS — E785 Hyperlipidemia, unspecified: Secondary | ICD-10-CM

## 2016-05-14 DIAGNOSIS — R454 Irritability and anger: Secondary | ICD-10-CM

## 2016-05-14 DIAGNOSIS — R6889 Other general symptoms and signs: Secondary | ICD-10-CM | POA: Diagnosis not present

## 2016-05-14 DIAGNOSIS — Z125 Encounter for screening for malignant neoplasm of prostate: Secondary | ICD-10-CM

## 2016-05-14 MED ORDER — CITALOPRAM HYDROBROMIDE 20 MG PO TABS: 20.0000 mg | ORAL_TABLET | Freq: Every day | ORAL | Status: DC

## 2016-05-14 MED FILL — CITALOPRAM HBR 20 MG TABLET: 20 | 30 days supply | Qty: 30 | Fill #0

## 2016-05-14 NOTE — Patient Instructions (Signed)
We are setting up your [place] for screening for aortic problems. I am giving you a written rx for the zostavax (shingles shot) I will send you a note about your blood work I have sent in a rx for citalopram==let me see you in 4-6 weeks to see how you are doing on that I have sent in a referral to neurology

## 2016-05-15 DIAGNOSIS — R454 Irritability and anger: Secondary | ICD-10-CM | POA: Insufficient documentation

## 2016-05-15 DIAGNOSIS — R251 Tremor, unspecified: Secondary | ICD-10-CM | POA: Insufficient documentation

## 2016-05-15 LAB — COMPLETE METABOLIC PANEL WITH GFR
ALT: 46 U/L (ref 9–46)
AST: 74 U/L — ABNORMAL HIGH (ref 10–35)
Albumin: 4.1 g/dL (ref 3.6–5.1)
Alkaline Phosphatase: 46 U/L (ref 40–115)
BUN: 19 mg/dL (ref 7–25)
CALCIUM: 9.3 mg/dL (ref 8.6–10.3)
CHLORIDE: 101 mmol/L (ref 98–110)
CO2: 21 mmol/L (ref 20–31)
Creat: 0.87 mg/dL (ref 0.70–1.18)
GFR, Est African American: >89 mL/min (ref >=60)
GFR, Est Non African American: 86 mL/min (ref >=60)
Glucose, Bld: 97 mg/dL (ref 65–99)
POTASSIUM: 3.8 mmol/L (ref 3.5–5.3)
Sodium: 140 mmol/L (ref 135–146)
Total Bilirubin: 0.5 mg/dL (ref 0.2–1.2)
Total Protein: 6.7 g/dL (ref 6.1–8.1)

## 2016-05-15 LAB — PSA: PSA: 1.2 ng/mL (ref <=4.00)

## 2016-05-15 LAB — LDL CHOLESTEROL, DIRECT: Direct LDL: 84 mg/dL (ref <130)

## 2016-05-15 NOTE — Assessment & Plan Note (Signed)
Complaint of early morning tremor tongue and , hand. Also complaining of some problems with involuntary movements of the right lower extremity that he notes particularly in the evening. He also complains a little bit of restless leg syndrome type symptoms but these seem to be separate from his involuntary movements of the right lower extremity. I will refer him to neurology.

## 2016-05-15 NOTE — Assessment & Plan Note (Signed)
Unclear if his muscle pains are true myalgias. They seem more like muscle cramps. We are sending him to neurology for other reasons and get their thoughts on the muscle pains as well. For now, we will leave him on his current medicines for hyperlipidemia and ASCVD

## 2016-05-15 NOTE — Assessment & Plan Note (Signed)
Continue current medication regimen. Blood pressure seems to be well-controlled. Check labs including an LDL cholesterol

## 2016-05-15 NOTE — Assessment & Plan Note (Signed)
Unclear etiology but does sound like over the last year so he's become quite vocal about his prejudices and believes. Per his wife this is occasionally been problematic from a safety standpoint. He also admits to quite a bit of road rage. Says when he's at home he just likes to read his candidal and be quiet. When he goes out, people irritate him. He agreed to trying low-dose citalopram and I'll see him back in 4 weeks.

## 2016-05-15 NOTE — Assessment & Plan Note (Signed)
PSA today

## 2016-05-15 NOTE — Progress Notes (Signed)
Subjective:    Patient ID: Joshua [name]., male    DOB: September 08, 1941, 75 y.o.   MRN: [id number]  HPI Here for complete physical exam/checkup. #1. Recently saw dermatologist for some skin lesions. They treated him with some type of topical crane. She did complete skin check #2. Taking his blood pressure medicine rarely without any problems. Denies chest pain. No change in exercise tolerance. #3. Cardiovascular disease: Currently tolerating his lipid medications without any problems although he occasionally has some muscle cramps. He does not describe these as muscle aches #4. He and his  wife have both noticed that in the morning he has tongue and hand tremor and occasionally he'll have some tremor of his right lower extremity. The right lower extremity issues mainly occur at night and he thinks this is caused by some restless leg symptoms. #5. His wife wanted him to talk to me about getting a "happy pill". She notes that he has been much more argumentative and irritable.Thise is problematic when he is in public because she reportsshe will talk very loudly and a badly about people that are around him. She is frightened that he will ultimately end up getting "in trouble". He admits grudgingly that she may be right. He denies suicidal or homicidal ideation. He does not have problems with sleep. He does not feel like he is depressed but admits he is very easily irritated by "stupid people".   PERTINENT  PMH / PSH: I have reviewed the patient's medications, allergies, past medical and surgical history. Pertinent findings that relate to today's visit / issues include: CV disease s/p CABG 1990, RIM graft to Cx: on ASA, allergy to plavix HTN Hyperlipidemia on ezetimibe and simvastatin, reported allergy to atorvastatin and rosuvastatin (myalgias?)   Review of Systems  Constitutional: Negative for activity change, appetite change, fatigue and unexpected weight change.  HENT: Negative for trouble  swallowing.   Eyes: Negative for pain and visual disturbance.  Respiratory: Negative for cough, chest tightness and shortness of breath.   Cardiovascular: Negative for chest pain.  Genitourinary: Negative for urgency, frequency and difficulty urinating.  Musculoskeletal: Positive for myalgias. Negative for joint swelling.  Neurological: Positive for tremors. Negative for facial asymmetry, speech difficulty, weakness, numbness and headaches.  Psychiatric/Behavioral: Negative for suicidal ideas, hallucinations, confusion, sleep disturbance, self-injury and decreased concentration. The patient is not nervous/anxious.        Irritable, esp in social situations       Objective:   Physical Exam  Constitutional: He is oriented to person, place, and time. He appears well-developed and well-nourished.  HENT:  Right Ear: External ear normal.  Left Ear: External ear normal.  Nose: Nose normal.  Mouth/Throat: Oropharynx is clear and moist.  Eyes: Conjunctivae and EOM are normal. Pupils are equal, round, and reactive to light. Right eye exhibits no discharge. Left eye exhibits no discharge. No scleral icterus.  Neck: Normal range of motion. Neck supple. No JVD present. No thyromegaly present.  Cardiovascular: Normal rate, regular rhythm, normal heart sounds and intact distal pulses.   No murmur heard. Pulmonary/Chest: Effort normal and breath sounds normal. He has no wheezes. He exhibits no tenderness.  Abdominal: Soft. Bowel sounds are normal.  Musculoskeletal: Normal range of motion. He exhibits no edema or tenderness.  Lymphadenopathy:    He has no cervical adenopathy.  Neurological: He is alert and oriented to person, place, and time. He has normal reflexes. No cranial nerve deficit. He exhibits normal muscle tone. Coordination normal.  Skin: Skin is warm.  Multiple actinic changes forearms, forehead.  Psychiatric: He has a normal mood and affect. His behavior is normal. Judgment and thought  content normal.          Assessment & Plan:  For preventive maintenance, check labs, continue current medications, check screening abdominal ultrasound. Prescription for Zostavax given. He is up-to-date on the rest of his health maintenance

## 2016-05-19 ENCOUNTER — Encounter: Payer: Self-pay | Admitting: Family Medicine

## 2016-05-26 ENCOUNTER — Ambulatory Visit (HOSPITAL_COMMUNITY)
Admission: RE | Admit: 2016-05-26 | Discharge: 2016-05-26 | Disposition: A | Discharge status: Home / Self Care | Payer: PPO | Source: Ambulatory Visit | Attending: Family Medicine | Admitting: Family Medicine

## 2016-05-26 ENCOUNTER — Other Ambulatory Visit: Payer: Self-pay | Admitting: Family Medicine

## 2016-05-26 ENCOUNTER — Encounter: Payer: Self-pay | Admitting: Family Medicine

## 2016-05-26 DIAGNOSIS — Z87891 Personal history of nicotine dependence: Secondary | ICD-10-CM | POA: Diagnosis not present

## 2016-05-26 DIAGNOSIS — Z0001 Encounter for general adult medical examination with abnormal findings: Secondary | ICD-10-CM

## 2016-05-26 DIAGNOSIS — R6889 Other general symptoms and signs: Secondary | ICD-10-CM | POA: Diagnosis not present

## 2016-05-26 DIAGNOSIS — Z136 Encounter for screening for cardiovascular disorders: Secondary | ICD-10-CM | POA: Diagnosis not present

## 2016-05-26 DIAGNOSIS — I77811 Abdominal aortic ectasia: Secondary | ICD-10-CM | POA: Diagnosis not present

## 2016-06-06 ENCOUNTER — Other Ambulatory Visit: Payer: Self-pay | Admitting: Family Medicine

## 2016-06-06 MED FILL — EZETIMIBE 10 MG TABLET: 10 | 90 days supply | Qty: 90 | Fill #2

## 2016-06-06 MED FILL — SIMVASTATIN 40 MG TABLET: 40 | 90 days supply | Qty: 90 | Fill #2

## 2016-06-06 MED FILL — ATENOLOL/CHLORTHAL 100/25: 100-25 | 90 days supply | Qty: 90 | Fill #0

## 2016-06-09 ENCOUNTER — Ambulatory Visit (INDEPENDENT_AMBULATORY_CARE_PROVIDER_SITE_OTHER): Payer: PPO | Admitting: Neurology

## 2016-06-09 ENCOUNTER — Encounter: Payer: Self-pay | Admitting: Neurology

## 2016-06-09 VITALS — BP 138/78 | HR 58 | Ht 67.5 in | Wt 216.0 lb

## 2016-06-09 DIAGNOSIS — R251 Tremor, unspecified: Secondary | ICD-10-CM

## 2016-06-09 DIAGNOSIS — G253 Myoclonus: Secondary | ICD-10-CM | POA: Diagnosis not present

## 2016-06-09 DIAGNOSIS — G2581 Restless legs syndrome: Secondary | ICD-10-CM

## 2016-06-09 DIAGNOSIS — R252 Cramp and spasm: Secondary | ICD-10-CM | POA: Diagnosis not present

## 2016-06-09 MED FILL — CITALOPRAM HBR 20 MG TABLET: 20 | 30 days supply | Qty: 30 | Fill #1

## 2016-06-09 NOTE — Progress Notes (Signed)
Subjective:   Joshua [name]. was seen in consultation in the movement disorder clinic at the request of Dorcas Mcmurray, MD.  The evaluation is for tremor. This patient is accompanied in the office by his spouse who supplements the history.  Pt reports that he has tremor of the tongue x 6 months.  States that it doesn't move all of the time but off and on.  It has become more frequent.  Pt states that he can control it if he stops thinking about it it starts again.  Never been on reglan or exposure to antipsychotics.  Wife states that sometimes the hands will shake, more the right hand than the L.  Balance is good.  Speech is good.  Swallowing is good.  No diplopia.  No hx of neuroimaging.  Doesn't hear clicking in the ear.    Admits to RLS, only when sits in the recliner at night.  If he gets up it goes away.  Doesn't bother him when lays down but does when he sits to watch TV.  Does have bad leg cramps at night.      Allergies  Allergen Reactions  . Ace Inhibitors     REACTION: cough  . Atorvastatin   . Plavix [Clopidogrel Bisulfate]   . Rosuvastatin   . Sulfonamide Derivatives     Outpatient Encounter Prescriptions as of 06/09/2016  Medication Sig  . [DISCONTINUED] fexofenadine (ALLEGRA ALLERGY) 180 MG tablet Take 1 tablet (180 mg total) by mouth daily.  Marland Kitchen aspirin 325 MG tablet Take 325 mg by mouth daily.  Marland Kitchen atenolol-chlorthalidone (TENORETIC) 100-25 MG tablet TAKE 1 TABLET BY MOUTH ONCE DAILY  . citalopram (CELEXA) 20 MG tablet Take 1 tablet (20 mg total) by mouth daily.  . diclofenac (VOLTAREN) 75 MG EC tablet Take 75 mg by mouth 2 (two) times daily. As directed   . ezetimibe (ZETIA) 10 MG tablet Take 1 tablet (10 mg total) by mouth daily.  Marland Kitchen oseltamivir (TAMIFLU) 75 MG capsule Take 1 capsule (75 mg total) by mouth daily. For 10 days prevention dose. If develop symptoms in 48 hr, take 1 cap twice daily for 5 days.  . ranitidine (ZANTAC) 75 MG tablet Take 150 mg by mouth 2 (two)  times daily.  . simvastatin (ZOCOR) 40 MG tablet Take 1 tablet (40 mg total) by mouth daily.   No facility-administered encounter medications on file as of 06/09/2016.    Past Medical History  Diagnosis Date  . Hyperlipidemia   . Hypertension   . GERD (gastroesophageal reflux disease)   . CAD (coronary artery disease)   . Chest pain, unspecified   . Coronary atherosclerosis of native coronary artery     Past Surgical History  Procedure Laterality Date  . Coronary artery bypass graft  1993    4V  . Coronary stent placement      Social History   Social History  . Marital Status: Married    Spouse Name: N/A  . Number of Children: N/A  . Years of Education: N/A   Occupational History  . Not on file.   Social History Main Topics  . Smoking status: Never Smoker   . Smokeless tobacco: Not on file  . Alcohol Use: No  . Drug Use: No  . Sexual Activity: Yes   Other Topics Concern  . Not on file   Social History Narrative    Family Status  Relation Status Death Age  . Mother Deceased 2  Ovarian cancer  . Son Alive   . Father Deceased 64    CHF  . Daughter Alive   . Brother Alive     Review of Systems A complete 10 system ROS was obtained and was negative apart from what is mentioned.   Objective:   VITALS:   Filed Vitals:   06/09/16 1237  BP: 138/78  Pulse: 58  Height: 5' 7.5" (1.715 m)  Weight: 216 lb (97.977 kg)   Pt undressed and placed into examining shorts and gown  Gen:  Appears stated age and in NAD. HEENT:  Normocephalic, atraumatic. The mucous membranes are moist. The superficial temporal arteries are without ropiness or tenderness. Cardiovascular: Regular rate and rhythm. Lungs: Clear to auscultation bilaterally. Neck: There are no carotid bruits noted bilaterally.  NEUROLOGICAL:  Orientation:  The patient is alert and oriented x 3.  Recent and remote memory are intact.  Attention span and concentration are normal.  Able to name  objects and repeat without trouble.  Fund of knowledge is appropriate Cranial nerves: There is good facial symmetry. The pupils are equal round and reactive to light bilaterally. Fundoscopic exam reveals clear disc margins bilaterally. Extraocular muscles are intact and visual fields are full to confrontational testing. Speech is fluent and clear. Soft palate rises symmetrically and there is no tongue deviation. Hearing is intact to conversational tone. Tone: Tone is good throughout. Sensation: Sensation is intact to light touch and pinprick throughout (facial, trunk, extremities). Vibration is intact at the bilateral big toe. There is no extinction with double simultaneous stimulation. There is no sensory dermatomal level identified. Coordination:  The patient has no dysdiadichokinesia or dysmetria. Motor: Strength is 5/5 in the bilateral upper and lower extremities.  Shoulder shrug is equal bilaterally.  There is no pronator drift.  There are no fasciculations noted. DTR's: Deep tendon reflexes are 2-/4 at the bilateral biceps, triceps, brachioradialis, patella and achilles.  Plantar response is downgoing on the r and neutral on the L Gait and Station: The patient is able to ambulate without difficulty. He is wide based  MOVEMENT EXAM: Tremor:  When initially I walked into the room, there appeared to be an abnormal movement of the tongue inside the mouth that was rhythmic.  However, when the mouth was examined, it was quiet.  No fasciculations, tremor or myoclonus was noted.  Labs:  No results found for: TSH   Chemistry      Component Value Date/Time   NA 140 05/14/2016 1234   K 3.8 05/14/2016 1234   CL 101 05/14/2016 1234   CO2 21 05/14/2016 1234   BUN 19 05/14/2016 1234   CREATININE 0.87 05/14/2016 1234   CREATININE 1.00 02/16/2010 0345      Component Value Date/Time   CALCIUM 9.3 05/14/2016 1234   ALKPHOS 46 05/14/2016 1234   AST 74* 05/14/2016 1234   ALT 46 05/14/2016 1234    BILITOT 0.5 05/14/2016 1234     Lab Results  Component Value Date   WBC 6.7 09/17/2011   HGB 14.9 09/17/2011   HCT 43.6 09/17/2011   MCV 90.5 09/17/2011   PLT 176 09/17/2011   No results found for: VITAMINB12      Assessment/Plan:   1.  Lingual tremor  -can be seen as part of the palatal myoclonus Syndrome but I didn't see that phenomenon today and only saw a little tremor today that was fleeting and did not get a good look at it.  Did not see tongue or  body fasicuculations today.  I will go ahead and do a contrasted MRI of the brain.  I told the patient that it was likely we will see small vessel disease and atrophy.  I ust want to make sure we see nothing in the guillian mollaret triangle.  If negative, we will do CBC, LFT, ceruloplasmin, copper,  TSH, PTH, ferritin, sedimentation rate, ANA, antiphospholipid antibody, lupus anticoagulant, RPR, B12, antigliadin antibody.  We will also consider EMG.  Pt was agreeable to this approach  2.  LE cramping  -advised to try tonic water  3.  RLS  -could try DA agonist in future but right now not that bothersome for pt.  4.  Further recommendations to follow the above testing.

## 2016-06-09 NOTE — Patient Instructions (Signed)
1. We have sent a referral to [facility] for your MRI and they will call you directly to schedule your appt. They are located at [address] If you need to contact them directly please call [phone].   

## 2016-06-18 ENCOUNTER — Telehealth: Payer: Self-pay | Admitting: Neurology

## 2016-06-18 ENCOUNTER — Ambulatory Visit
Admission: RE | Admit: 2016-06-18 | Discharge: 2016-06-18 | Disposition: A | Discharge status: Home / Self Care | Payer: PPO | Source: Ambulatory Visit | Attending: Neurology | Admitting: Neurology

## 2016-06-18 DIAGNOSIS — R252 Cramp and spasm: Secondary | ICD-10-CM

## 2016-06-18 DIAGNOSIS — G253 Myoclonus: Secondary | ICD-10-CM

## 2016-06-18 DIAGNOSIS — R413 Other amnesia: Secondary | ICD-10-CM | POA: Diagnosis not present

## 2016-06-18 DIAGNOSIS — R251 Tremor, unspecified: Secondary | ICD-10-CM

## 2016-06-18 MED ORDER — GADOBENATE DIMEGLUMINE 529 MG/ML IV SOLN
20.0000 mL | Freq: Once | INTRAVENOUS | Status: AC | PRN
  Administered 2016-06-18: 20 mL via INTRAVENOUS

## 2016-06-18 NOTE — Telephone Encounter (Signed)
Since brain MRI didn't show anything to explain sx's do labs as in note.

## 2016-06-19 NOTE — Telephone Encounter (Signed)
Patient's wife made aware MR okay. Lab orders entered. They will come to our office first to check into [facility] lab, then have these drawn.

## 2016-07-16 ENCOUNTER — Other Ambulatory Visit: Payer: PPO

## 2016-07-16 DIAGNOSIS — G253 Myoclonus: Secondary | ICD-10-CM

## 2016-07-16 DIAGNOSIS — R251 Tremor, unspecified: Secondary | ICD-10-CM | POA: Diagnosis not present

## 2016-07-16 DIAGNOSIS — G2581 Restless legs syndrome: Secondary | ICD-10-CM | POA: Diagnosis not present

## 2016-07-16 DIAGNOSIS — R5383 Other fatigue: Secondary | ICD-10-CM | POA: Diagnosis not present

## 2016-07-16 DIAGNOSIS — R252 Cramp and spasm: Secondary | ICD-10-CM

## 2016-07-16 DIAGNOSIS — I2510 Atherosclerotic heart disease of native coronary artery without angina pectoris: Secondary | ICD-10-CM | POA: Diagnosis not present

## 2016-07-16 LAB — CBC WITH DIFFERENTIAL/PLATELET
BASOS ABS: 0 {cells}/uL (ref 0–200)
Basophils Relative: 0 %
EOS PCT: 4 %
Eosinophils Absolute: 312 cells/uL (ref 15–500)
HCT: 42.6 % (ref 38.5–50.0)
Hemoglobin: 14.5 g/dL (ref 13.2–17.1)
LYMPHS ABS: 2262 {cells}/uL (ref 850–3900)
Lymphocytes Relative: 29 %
MCH: 29.5 pg (ref 27.0–33.0)
MCHC: 34 g/dL (ref 32.0–36.0)
MCV: 86.8 fL (ref 80.0–100.0)
MPV: 11 fL (ref 7.5–12.5)
Monocytes Absolute: 702 cells/uL (ref 200–950)
Monocytes Relative: 9 %
NEUTROS ABS: 4524 {cells}/uL (ref 1500–7800)
Neutrophils Relative %: 58 %
PLATELETS: 186 10*3/uL (ref 140–400)
RBC: 4.91 MIL/uL (ref 4.20–5.80)
RDW: 14.1 % (ref 11.0–15.0)
WBC: 7.8 10*3/uL (ref 3.8–10.8)

## 2016-07-16 LAB — HEPATIC FUNCTION PANEL
ALBUMIN: 4.3 g/dL (ref 3.6–5.1)
ALT: 42 U/L (ref 9–46)
AST: 54 U/L — ABNORMAL HIGH (ref 10–35)
Alkaline Phosphatase: 53 U/L (ref 40–115)
BILIRUBIN TOTAL: 0.5 mg/dL (ref 0.2–1.2)
Bilirubin, Direct: 0.1 mg/dL (ref <=0.2)
Indirect Bilirubin: 0.4 mg/dL (ref 0.2–1.2)
Total Protein: 6.6 g/dL (ref 6.1–8.1)

## 2016-07-16 LAB — IRON,TIBC AND FERRITIN PANEL
%SAT: 18 % (ref 15–60)
FERRITIN: 75 ng/mL (ref 20–380)
IRON: 67 ug/dL (ref 50–180)
TIBC: 379 ug/dL (ref 250–425)

## 2016-07-16 LAB — TSH: TSH: 3.15 mIU/L (ref 0.40–4.50)

## 2016-07-16 LAB — VITAMIN B12: VITAMIN B 12: 244 pg/mL (ref 200–1100)

## 2016-07-17 LAB — PARATHYROID HORMONE, INTACT (NO CA): PTH: 30 pg/mL (ref 14–64)

## 2016-07-17 LAB — SEDIMENTATION RATE: Sed Rate: 7 mm/h (ref 0–20)

## 2016-07-17 LAB — GLIADIN ANTIBODIES, SERUM
GLIADIN IGA: 7 U (ref <20)
GLIADIN IGG: 2 U (ref <20)

## 2016-07-17 LAB — RPR

## 2016-07-18 LAB — LUPUS ANTICOAGULANT PANEL

## 2016-07-18 LAB — RFX PTT-LA W/RFX TO HEX PHASE CONF: PTT-LA Screen: 33 s (ref <=40)

## 2016-07-18 LAB — CERULOPLASMIN: CERULOPLASMIN: 34 mg/dL (ref 18–36)

## 2016-07-18 LAB — RFX DRVVT SCR W/RFLX CONF 1:1 MIX: DRVVT SCREEN: 40 s (ref <=45)

## 2016-07-18 LAB — ANA: Anti Nuclear Antibody(ANA): NEGATIVE

## 2016-07-19 LAB — CARDIOLIPIN ANTIBODY: PHOSPHOLIPIDS: 204 mg/dL (ref 151–264)

## 2016-07-24 ENCOUNTER — Telehealth: Payer: Self-pay | Admitting: Neurology

## 2016-07-24 LAB — COPPER, FREE: Copper - Free, Serum/Plasma: 690 mcg/L

## 2016-07-24 NOTE — Telephone Encounter (Signed)
Patient made aware.

## 2016-07-24 NOTE — Telephone Encounter (Signed)
-----   Message from [place], DO sent at 07/24/2016  9:52 AM EDT ----- Besides for low B12, all other labs looked okay.  Take b12 1052mcg daily

## 2016-07-24 NOTE — Telephone Encounter (Signed)
Left message on machine for patient to call back.

## 2016-07-24 NOTE — Telephone Encounter (Signed)
Patient is returning your call please call [phone]

## 2016-08-07 MED FILL — CITALOPRAM HBR 20 MG TABLET: 20 | 30 days supply | Qty: 30 | Fill #2

## 2016-09-01 MED FILL — CITALOPRAM HBR 20 MG TABLET: 20 | 30 days supply | Qty: 30 | Fill #3

## 2016-09-08 MED FILL — ATENOLOL/CHLORTHAL 100/25: 100-25 | 90 days supply | Qty: 90 | Fill #1

## 2016-09-08 MED FILL — EZETIMIBE 10 MG TABLET: 10 | 90 days supply | Qty: 90 | Fill #3

## 2016-09-08 MED FILL — SIMVASTATIN 40 MG TABLET: 40 | 90 days supply | Qty: 90 | Fill #3

## 2016-09-10 ENCOUNTER — Telehealth: Payer: PPO | Admitting: Nurse Practitioner

## 2016-09-10 DIAGNOSIS — N3000 Acute cystitis without hematuria: Secondary | ICD-10-CM

## 2016-09-10 MED ORDER — CIPROFLOXACIN HCL 500 MG PO TABS: 500.0000 mg | ORAL_TABLET | Freq: Two times a day (BID) | ORAL | 0 refills | Status: DC

## 2016-09-10 MED FILL — CIPROFLOXACIN HCL 500 MG TA: 500 | 10 days supply | Qty: 20 | Fill #0

## 2016-09-10 NOTE — Progress Notes (Signed)

## 2016-09-19 ENCOUNTER — Telehealth: Payer: Self-pay | Admitting: Physician Assistant

## 2016-09-19 ENCOUNTER — Telehealth: Payer: Self-pay | Admitting: Family Medicine

## 2016-09-19 MED ORDER — ATENOLOL-CHLORTHALIDONE 100-25 MG PO TABS: 1.0000 | ORAL_TABLET | Freq: Every day | ORAL | 0 refills | Status: DC

## 2016-09-19 NOTE — Telephone Encounter (Signed)
Patient's wife called on after hours emergency line. Michela Pitcher that they are currently visiting in [place] and the patient forgot to bring his medications. His wife asked if his blood pressure medications could be sent in to [facility]. Rx for 5 tablets was sent in. Wife voiced understanding and had no further questions.  Algis Greenhouse. Jerline Pain, [facility] Resident PGY-3 09/19/2016 7:46 PM

## 2016-09-19 NOTE — Telephone Encounter (Signed)
Pt's wife contacted cardiology service to ask advice regarding medication. States they are in [place] and patient forgot his atenolol/chlorthalidone. She asked if he should be OK until Monday without this. Given pt's history would advise he obtain this to take while on vacation. He uses [facility] which is closed for the weekend. Unfortunately since he has not been seen in our practice in the last 3 years I cannot legally prescribe this medicine. I have recommended 2 alternatives - for him to contact PCP's office to see if on-call physician is willing to prescribe short-term supply while on vacation, alternatively to do an E-visit as they are open 8-8 seven days a week. Wife verbalized understanding and gratitude. I also advised that given his cardiac hx would recommend re-establishing f/u with cardiology. Dayna Dunn PA-C

## 2016-10-31 DIAGNOSIS — H52203 Unspecified astigmatism, bilateral: Secondary | ICD-10-CM | POA: Diagnosis not present

## 2016-11-07 ENCOUNTER — Other Ambulatory Visit: Payer: Self-pay | Admitting: Family Medicine

## 2016-11-07 MED FILL — CITALOPRAM HBR 20 MG TABLET: 20 | 30 days supply | Qty: 30 | Fill #0

## 2016-11-28 ENCOUNTER — Other Ambulatory Visit: Payer: Self-pay | Admitting: *Deleted

## 2016-11-28 MED ORDER — ATENOLOL-CHLORTHALIDONE 100-25 MG PO TABS: 1.0000 | ORAL_TABLET | Freq: Every day | ORAL | 0 refills | Status: DC

## 2016-11-28 MED ORDER — SIMVASTATIN 40 MG PO TABS: 40.0000 mg | ORAL_TABLET | Freq: Every day | ORAL | 3 refills | Status: DC

## 2016-11-28 MED ORDER — EZETIMIBE 10 MG PO TABS: 10.0000 mg | ORAL_TABLET | Freq: Every day | ORAL | 3 refills | Status: DC

## 2016-11-28 MED ORDER — CITALOPRAM HYDROBROMIDE 20 MG PO TABS: 20.0000 mg | ORAL_TABLET | Freq: Every day | ORAL | 3 refills | Status: DC

## 2016-11-28 MED ORDER — ESOMEPRAZOLE MAGNESIUM 20 MG PO CPDR: 20.0000 mg | DELAYED_RELEASE_CAPSULE | Freq: Every day | ORAL | 3 refills | Status: DC

## 2016-11-28 NOTE — Telephone Encounter (Signed)
Patient wife requesting a 55-month supply of meds be sent to [facility]. [facility]

## 2016-12-05 ENCOUNTER — Other Ambulatory Visit: Payer: Self-pay | Admitting: Family Medicine

## 2016-12-05 DIAGNOSIS — I25810 Atherosclerosis of coronary artery bypass graft(s) without angina pectoris: Secondary | ICD-10-CM

## 2016-12-16 ENCOUNTER — Other Ambulatory Visit: Payer: Self-pay | Admitting: Family Medicine

## 2016-12-16 MED ORDER — EZETIMIBE 10 MG PO TABS: 10.0000 mg | ORAL_TABLET | Freq: Every day | ORAL | 3 refills | Status: DC

## 2016-12-16 MED ORDER — SIMVASTATIN 40 MG PO TABS: 40.0000 mg | ORAL_TABLET | Freq: Every day | ORAL | 3 refills | Status: DC

## 2016-12-16 MED ORDER — CITALOPRAM HYDROBROMIDE 20 MG PO TABS: 20.0000 mg | ORAL_TABLET | Freq: Every day | ORAL | 3 refills | Status: DC

## 2016-12-19 ENCOUNTER — Other Ambulatory Visit: Payer: Self-pay | Admitting: Family Medicine

## 2016-12-19 MED ORDER — ATENOLOL-CHLORTHALIDONE 100-25 MG PO TABS: 1.0000 | ORAL_TABLET | Freq: Every day | ORAL | 3 refills | Status: DC

## 2017-01-02 ENCOUNTER — Encounter: Payer: Self-pay | Admitting: Family Medicine

## 2017-01-02 ENCOUNTER — Ambulatory Visit (INDEPENDENT_AMBULATORY_CARE_PROVIDER_SITE_OTHER): Payer: PPO | Admitting: Family Medicine

## 2017-01-02 DIAGNOSIS — B029 Zoster without complications: Secondary | ICD-10-CM | POA: Diagnosis not present

## 2017-01-02 DIAGNOSIS — B0230 Zoster ocular disease, unspecified: Secondary | ICD-10-CM

## 2017-01-02 DIAGNOSIS — B0229 Other postherpetic nervous system involvement: Secondary | ICD-10-CM | POA: Insufficient documentation

## 2017-01-02 MED ORDER — VALACYCLOVIR HCL 1 G PO TABS: 1000.0000 mg | ORAL_TABLET | Freq: Three times a day (TID) | ORAL | 0 refills | Status: DC

## 2017-01-02 NOTE — Assessment & Plan Note (Addendum)
I gave him position for Valtrex 1000 mg every 8 hours 7 days. Potentially he may need to extend this treatment. He has a appointment with his ophthalmologist this afternoon at 2:00 to evaluate his eye for concern for ocular herpes zoster. I also gave him prescription for the Zostavax vaccine. I would recommend he wait minimum of 2 months before she feels that and gets it administered. I discussed this with him and his wife both.Greater than 50% of our 25 minute office visit was spent in counseling and education regarding these issues and in coordination of care. We also briefly discussed postherpetic neuralgia. We discussed red flags that would make it want to call the office back. He'll follow-up with me in 4 weeks and when necessary.

## 2017-01-02 NOTE — Progress Notes (Signed)
  Haru Nicklas Heintz. - 76 y.o. male MRN [id number]  Date of birth: [birth year]    SUBJECTIVE:      Chief Complaint:/ HPI:   4-48 hours of right sided forehead and cheek pain with onset of some rash last night. He is concerned that he has shingles. I had previously given him a prescription for the Zostavax but he never got it filled and never received immunization. He's had no visual changes. His eye does feel little dry and scratchy. The forehead and cheek area feels tingly and tender at the same time.   ROS:     He's had no fever. No auditory changes.  PERTINENT  PMH / PSH FH / / SH:  Past Medical, Surgical, Social, and Family History Reviewed & Updated in the EMR.  Pertinent findings include:  Hypertension CAD History of retinal hemorrhage   OBJECTIVE: BP (!) 144/82   Pulse (!) 56   Ht 5\' 5"  (1.651 m)   Wt 215 lb (97.5 kg)   BMI 35.78 kg/m   Physical Exam:  Vital signs are reviewed. GEN.: Well-developed male no acute distress SKIN: Several vesicles noted on the right forehead right lateral nose, underneath the right thigh and the right cheek. There are none noted in the right external auditory canal. Has none in the scalp. EYES: Conjunctivae nonicteric. Extraocular muscles are intact. Pupils are equal round reactive to light  ASSESSMENT & PLAN:  See problem based charting & AVS for pt instructions.

## 2017-01-06 ENCOUNTER — Ambulatory Visit: Payer: PPO | Admitting: Interventional Cardiology

## 2017-01-12 ENCOUNTER — Telehealth: Payer: Self-pay | Admitting: *Deleted

## 2017-01-12 NOTE — Telephone Encounter (Signed)
Pt wife calls to inform Dr. Nori Riis of the following:  Pts shingles are "clearing up but not gone yet.  The Redness is gone and they are scabbing over but still there"  She also states that he is still having a considerable amount of pain in his head and the ibuprofen is "not touching it"  She wants to know if there is something else that Dr. Nori Riis can suggest. Joshua [name], Salome Spotted, [place]

## 2017-01-13 MED ORDER — TRAMADOL HCL 50 MG PO TABS: ORAL_TABLET | ORAL | 0 refills | Status: DC

## 2017-01-13 NOTE — Telephone Encounter (Signed)
Contacted pt pharmacy and called in Rx per Dr. Nori Riis and then notified pt that Rx has been called in and the information from message.  Informed pt that a message was left on his wife phone and that she does not need to call us back. Katharina Caper, April D, [place]

## 2017-01-13 NOTE — Telephone Encounter (Signed)
Dear Dema Severin Team I am not surprised it is not totally gone yet. May take another week to 10 days before all spots are scabbed and gone  Re the pain, please call in some tramadol for him Rx below Let him know if this is not helping he needs to let ME know Do not drive while taking tramadol THANKS! Dorcas Mcmurray

## 2017-01-22 ENCOUNTER — Telehealth: Payer: Self-pay | Admitting: Family Medicine

## 2017-01-22 NOTE — Telephone Encounter (Signed)
Will forward to Dr. Nori Riis. Please advise.  Derl Barrow, RN

## 2017-01-22 NOTE — Telephone Encounter (Signed)
Pt was diagnosed with shingles on Feb.2nd. Took a dose of Valtrex and was given tramadol which only gave slight relief. Pt is having really bad pain where the outbreak was, pt shakes and sweats when he has the episodes, last night's episode lasted 10 mins. Wife wants to know if pt needs something stronger for pain or if this is normal. [phone]. ep

## 2017-01-23 MED ORDER — GABAPENTIN 100 MG PO CAPS: 100.0000 mg | ORAL_CAPSULE | Freq: Three times a day (TID) | ORAL | 3 refills | Status: DC

## 2017-01-23 NOTE — Telephone Encounter (Signed)
Dear Dema Severin Team I am calling in some gabapentin--take one by mouth three times a day and schedule him to see me on Wednesday of this week. He should CONTIUE the tramadol with that  If symptoms get worse we would be happy to see him earlier (SDA appt) THANKS! Dorcas Mcmurray

## 2017-01-23 NOTE — Telephone Encounter (Signed)
Pt wife informed of below and appointment scheduled. Katharina Caper, April D, [place]

## 2017-01-28 ENCOUNTER — Encounter: Payer: Self-pay | Admitting: Family Medicine

## 2017-01-28 ENCOUNTER — Ambulatory Visit (INDEPENDENT_AMBULATORY_CARE_PROVIDER_SITE_OTHER): Payer: PPO | Admitting: Family Medicine

## 2017-01-28 DIAGNOSIS — B0229 Other postherpetic nervous system involvement: Secondary | ICD-10-CM

## 2017-01-28 MED ORDER — GABAPENTIN 300 MG PO CAPS: ORAL_CAPSULE | ORAL | 3 refills | Status: DC

## 2017-01-28 NOTE — Assessment & Plan Note (Signed)
We'll continue tramadol as needed and start taper up of gabapentin follow-up 3-4 weeks, sooner with new or worsening symptoms.

## 2017-01-28 NOTE — Patient Instructions (Signed)
Take by tablet by mouth Day 1-3   Take 2 at night, one AM and one mid day Day 4-6  take 3 at night, one AM and one mid day Day 7-10  3 at night, 2 in am and one midday Day 11-15  3 at night,   3 in am, 3 mid day   I have given you a Rx for the 300 mg tabs---substitute when you run out  Your goal will be 300 mg in am, 300 mg in mid day and 600 mg in evening Let me see you in 3 weeks or so Call me if worse

## 2017-01-28 NOTE — Progress Notes (Signed)
    CHIEF COMPLAINT / HPI:   Follow-up on shingles The rash is pretty much resolved but he continues to have severe pain. Intermittently he will have 10 out of 10 pain that lasts 1-2 minutes. Has this may be for 5 times a week. Other times he has constant ache in the right side of his face, radiates back to his ear in the posterior portion of his skull. Keeping him awake at night. The tramadol gave him his only helping a little bit. We had started him on gabapentin and so far has not seen any improvement with that.  REVIEW OF SYSTEMS:  No fever.  OBJECTIVE:  Vital signs are reviewed.   GEN.: Well-developed male no acute distress SKIN: Right facial area is resolved of the previous skin blistering. NEURO: Hyperesthesia on the right in the trigeminal nerve distribution. HEENT: Ear canals without any sign of lesions  ASSESSMENT / PLAN: Please see problem oriented charting for details

## 2017-01-30 ENCOUNTER — Telehealth: Payer: Self-pay | Admitting: Family Medicine

## 2017-01-30 NOTE — Telephone Encounter (Signed)
I tried calling it in and cannot--I did not get messg=age until after 5 pm and office is closed-- spoke w pt wife--he will increase the rapidity of the gabapentin taper as we cannot get hm any vicodin at this juncture.  She also said he had several NEW bumps---not blistered yet. Concerning. I have never seen shingles break out in blisters once they resolved but IF he starts to have a lot of blisters again, I would have him call after hours line and I wouold RECOMMEND REPEAT VALTREX AT TREATMENT DOSE.  Dorcas Mcmurray

## 2017-01-30 NOTE — Telephone Encounter (Signed)
Wife called. Pt has a 2 small red spots coming out again that look  like they may be blistering up again. His face is turning red on the same side again. pt  would like to have the valtrex refilled. He has rethought the vicodan because of the extreme pain he was in last night and would like to get the rx for vicodan.

## 2017-02-06 ENCOUNTER — Encounter: Payer: Self-pay | Admitting: Family Medicine

## 2017-02-08 ENCOUNTER — Encounter: Payer: Self-pay | Admitting: Family Medicine

## 2017-02-09 ENCOUNTER — Encounter: Payer: Self-pay | Admitting: Interventional Cardiology

## 2017-02-09 ENCOUNTER — Ambulatory Visit (INDEPENDENT_AMBULATORY_CARE_PROVIDER_SITE_OTHER): Payer: PPO | Admitting: Interventional Cardiology

## 2017-02-09 VITALS — BP 140/82 | HR 56 | Ht 68.0 in | Wt 218.8 lb

## 2017-02-09 DIAGNOSIS — I10 Essential (primary) hypertension: Secondary | ICD-10-CM | POA: Diagnosis not present

## 2017-02-09 DIAGNOSIS — E7849 Other hyperlipidemia: Secondary | ICD-10-CM

## 2017-02-09 DIAGNOSIS — E784 Other hyperlipidemia: Secondary | ICD-10-CM

## 2017-02-09 DIAGNOSIS — I25709 Atherosclerosis of coronary artery bypass graft(s), unspecified, with unspecified angina pectoris: Secondary | ICD-10-CM

## 2017-02-09 DIAGNOSIS — R0989 Other specified symptoms and signs involving the circulatory and respiratory systems: Secondary | ICD-10-CM | POA: Diagnosis not present

## 2017-02-09 NOTE — Progress Notes (Signed)
Cardiology Office Note    Date:  02/09/2017   ID:  Joshua [name]., DOB 08/03/1941, MRN [id number]  PCP:  Joshua Mcmurray, MD  Cardiologist: Joshua Grooms, MD   Chief Complaint  Patient presents with  . Coronary Artery Disease    History of Present Illness:  Joshua [name]. is a 76 y.o. male with long-standing history of essential hypertension, coronary artery disease with bypass grafting in 1990 with right internal mammary to LAD, left internal mammary to circumflex, SVG to diagonal, and SVG to RCA; bare-metal stent SVG to RCA 2011, hyperlipidemia, diabetes mellitus, and peripheral neuropathy.  He is doing well. He has small inferior Q waves on his EKG which also shows chronic right lumbar branch block. He has not had chest discomfort. Legs get tired with physical activity. No pain or difficulty with healing in the lower extremities. There is some dyspnea on exertion. No anginal quality chest discomfort or need for nitroglycerin.  Past Medical History:  Diagnosis Date  . CAD (coronary artery disease)   . Chest pain, unspecified   . Coronary atherosclerosis of native coronary artery   . GERD (gastroesophageal reflux disease)   . Hyperlipidemia   . Hypertension     Past Surgical History:  Procedure Laterality Date  . CATARACT EXTRACTION, BILATERAL    . CORONARY ARTERY BYPASS GRAFT  1993   4V  . CORONARY STENT PLACEMENT      Current Medications: Outpatient Medications Prior to Visit  Medication Sig Dispense Refill  . aspirin 325 MG tablet Take 325 mg by mouth daily.    Marland Kitchen atenolol-chlorthalidone (TENORETIC) 100-25 MG tablet Take 1 tablet by mouth daily. 90 tablet 3  . cetirizine (ZYRTEC) 10 MG tablet Take 10 mg by mouth daily.    . citalopram (CELEXA) 20 MG tablet Take 1 tablet (20 mg total) by mouth daily. 90 tablet 3  . esomeprazole (NEXIUM) 20 MG capsule Take 1 capsule (20 mg total) by mouth daily at 12 noon. 90 capsule 3  . ezetimibe (ZETIA) 10 MG tablet Take 1  tablet (10 mg total) by mouth daily. 90 tablet 3  . gabapentin (NEURONTIN) 100 MG capsule Take 1 capsule (100 mg total) by mouth 3 (three) times daily. 90 capsule 3  . simvastatin (ZOCOR) 40 MG tablet Take 1 tablet (40 mg total) by mouth daily. 90 tablet 3  . traMADol (ULTRAM) 50 MG tablet Take one or two tablets by mouth  every 8 hours as needed for shingles pain do not drive while using  And max 6 tabs 24 hours (Patient not taking: Reported on 02/09/2017) 60 tablet 0  . gabapentin (NEURONTIN) 300 MG capsule Patient has written taper up to four tabs a day (Patient not taking: Reported on 02/09/2017) 120 capsule 3   No facility-administered medications prior to visit.      Allergies:   Ace inhibitors; Atorvastatin; Plavix [clopidogrel bisulfate]; Rosuvastatin; and Sulfonamide derivatives   Social History   Social History  . Marital status: Married    Spouse name: N/A  . Number of children: N/A  . Years of education: N/A   Occupational History  . retired     Retail buyer (truck driver, heating and air)   Social History Main Topics  . Smoking status: Never Smoker  . Smokeless tobacco: Never Used  . Alcohol use 0.0 oz/week     Comment: once a month  . Drug use: No  . Sexual activity: Yes   Other Topics  Concern  . None   Social History Narrative  . None     Family History:  The patient's family history includes Cancer in his mother; Coronary artery disease in his father; Heart attack in his father; Hyperlipidemia in his son; Hypertension in his son.   ROS:   Please see the history of present illness.    Some depression, recent development of shingles, easy bruising, dyspnea with bending. Suffering from right facial shingles. All other systems reviewed and are negative.   PHYSICAL EXAM:   VS:  BP 140/82 (BP Location: Left Arm)   Pulse (!) 56   Ht 5\' 8"  (1.727 m)   Wt 218 lb 12.8 oz (99.2 kg)   BMI 33.27 kg/m    GEN: Well nourished, well developed, in no acute distress.    HEENT: normal  Neck: no JVD, carotid bruits, or masses Cardiac: RRR; no murmurs, rubs, or gallops,But there is trace bilateral ankle edema. Respiratory:  clear to auscultation bilaterally, normal work of breathing GI: soft, nontender, nondistended, + BS MS: no deformity or atrophy. Absent pulses below the knee in the right lower extremity.   Skin: warm and dry, no rash Neuro:  Alert and Oriented x 3, Strength and sensation are intact Psych: euthymic mood, full affect  Wt Readings from Last 3 Encounters:  02/09/17 218 lb 12.8 oz (99.2 kg)  01/28/17 216 lb 9.6 oz (98.2 kg)  01/02/17 215 lb (97.5 kg)      Studies/Labs Reviewed:   EKG:  EKG  Sinus rhythm, left atrial abnormality, right bundle branch block, small inferior Q waves. When compared to 2010, no significant changes noted.  Recent Labs: 05/14/2016: BUN 19; Creat 0.87; Potassium 3.8; Sodium 140 07/16/2016: ALT 42; Hemoglobin 14.5; Platelets 186; TSH 3.15   Lipid Panel    Component Value Date/Time   CHOL 139 12/14/2014 1147   TRIG 231 (H) 12/14/2014 1147   HDL 25 (L) 12/14/2014 1147   CHOLHDL 5.6 12/14/2014 1147   VLDL 46 (H) 12/14/2014 1147   LDLCALC 68 12/14/2014 1147   LDLDIRECT 84 05/14/2016 1234    Additional studies/ records that were reviewed today include:  None    ASSESSMENT:    1. Atherosclerosis of coronary artery bypass graft of native heart with angina pectoris ([name])   2. HYPERTENSION, BENIGN ESSENTIAL   3. Other hyperlipidemia   4. Absent pedal pulses      PLAN:  In order of problems listed above:  1. He has no symptoms that are compatible with angina unless it has to do with dyspnea. He feels it dyspnea is related to deconditioning and sedentary lifestyle. No associated chest discomfort. He does have old saphenous vein grafts and the right coronary graft was stented with a bare-metal device in 2011. We need to exclude LV dysfunction as a possible explanation for the dyspnea. 2. Blood pressures  is under excellent control. 2 g sodium diet. Weight control are all discussed and appendectomy complicated as control measures. 3. Followed by primary care. LDL target is 70. Last recorded value from June 2017 the LDL was 84. I would recommend tweaking the lipid therapy by switching Zocor to rosuvastatin or atorvastatin and actively attempting to achieve LDL of 70 or less. 4. The right lower extremity does not have palpable pulses at the dorsalis pedis or posterior tibial. Popliteal pulse is 2+.  Overall plan clinical follow-up in one year. Achieve LDL less than 70. 2-D Doppler echocardiogram to document LV function. If lower extremity claudication develops,  will need to have bilateral lower extremity Doppler study performed.    Medication Adjustments/Labs and Tests Ordered: Current medicines are reviewed at length with the patient today.  Concerns regarding medicines are outlined above.  Medication changes, Labs and Tests ordered today are listed in the Patient Instructions below. Patient Instructions  Medication Instructions:  None  Labwork: None  Testing/Procedures: Your physician has requested that you have an echocardiogram. Echocardiography is a painless test that uses sound waves to create images of your heart. It provides your doctor with information about the size and shape of your heart and how well your heart's chambers and valves are working. This procedure takes approximately one hour. There are no restrictions for this procedure.    Follow-Up: Your physician wants you to follow-up in: 1 year with Dr. Tamala Julian.  You will receive a reminder letter in the mail two months in advance. If you don't receive a letter, please call our office to schedule the follow-up appointment.   Any Other Special Instructions Will Be Listed Below (If Applicable).     If you need a refill on your cardiac medications before your next appointment, please call your pharmacy.      Signed, Joshua Grooms, MD  02/09/2017 9:28 AM    [address], [place], [place]  [address] Phone: [phone]; Fax: [phone]

## 2017-02-09 NOTE — Patient Instructions (Signed)
Medication Instructions:  None  Labwork: None  Testing/Procedures: Your physician has requested that you have an echocardiogram. Echocardiography is a painless test that uses sound waves to create images of your heart. It provides your doctor with information about the size and shape of your heart and how well your heart's chambers and valves are working. This procedure takes approximately one hour. There are no restrictions for this procedure.   Follow-Up: Your physician wants you to follow-up in: 1 year with Dr. Smith.  You will receive a reminder letter in the mail two months in advance. If you don't receive a letter, please call our office to schedule the follow-up appointment.   Any Other Special Instructions Will Be Listed Below (If Applicable).     If you need a refill on your cardiac medications before your next appointment, please call your pharmacy.   

## 2017-02-10 ENCOUNTER — Ambulatory Visit (HOSPITAL_COMMUNITY): Payer: PPO

## 2017-02-11 ENCOUNTER — Other Ambulatory Visit: Payer: Self-pay | Admitting: Family Medicine

## 2017-02-11 MED ORDER — HYDROCODONE-ACETAMINOPHEN 10-325 MG PO TABS: 1.0000 | ORAL_TABLET | Freq: Four times a day (QID) | ORAL | 0 refills | Status: DC | PRN

## 2017-02-16 ENCOUNTER — Encounter: Payer: Self-pay | Admitting: Family Medicine

## 2017-02-18 ENCOUNTER — Ambulatory Visit (INDEPENDENT_AMBULATORY_CARE_PROVIDER_SITE_OTHER): Payer: PPO | Admitting: Family Medicine

## 2017-02-18 ENCOUNTER — Encounter: Payer: Self-pay | Admitting: Family Medicine

## 2017-02-18 VITALS — BP 138/70 | HR 56 | Temp 98.0°F | Ht 68.0 in | Wt 223.2 lb

## 2017-02-18 DIAGNOSIS — B0229 Other postherpetic nervous system involvement: Secondary | ICD-10-CM | POA: Diagnosis not present

## 2017-02-18 MED ORDER — GABAPENTIN 300 MG PO CAPS: ORAL_CAPSULE | ORAL | 3 refills | Status: DC

## 2017-02-18 MED ORDER — HYDROCODONE-ACETAMINOPHEN 10-325 MG PO TABS: 1.0000 | ORAL_TABLET | Freq: Four times a day (QID) | ORAL | 0 refills | Status: DC | PRN

## 2017-02-18 MED ORDER — AMITRIPTYLINE HCL 25 MG PO TABS: 25.0000 mg | ORAL_TABLET | Freq: Every day | ORAL | 1 refills | Status: DC

## 2017-02-22 NOTE — Progress Notes (Signed)
    CHIEF COMPLAINT / HPI:   continue pain from postherpetic neuralgia.  He still having lancinating pain multiple times a day.  Still having trouble sleeping.  They have continues to taper up the gabapentin and his wife thinks he is having some mild improvement because he is doing more but he says the improvement is very small.  He has occasionally taken some of the hydrocodone.  He has discontinued the tramadol as it did not seem to help much at all.  He's had no new skin lesions.  Senna change in his eyesight.  REVIEW OF SYSTEMS: See HPI.  No unusual weight change.  No fever.  OBJECTIVE:  Vital signs are reviewed.   GENERAL: Well-developed male no acute distress SKIN:there are no lesions or scarring noted in the area of his previous shingles outbreak on his face on the right. HEENT: External auditory canal and the right looks normal.  He has normal gross hearing bilaterally.  Bilateral eyes are without any signs of lesions, pupils equal round reactive to light and accommodation, and vision grossly normal.  ASSESSMENT / PLAN: Please see problem oriented charting for details

## 2017-02-22 NOTE — Assessment & Plan Note (Signed)
We discussed options as being greater than 50% of our 25 minute office visit counseling and erding these issues.  I did offer him a referral to neurology but he doesn't want to do that at this point.  We agreed to taper up his gabapentin some more and add a low-dose amitriptyline at night.  She'll try that for the next week or so and see if it's improving.  Also gave him a refill on his hydrocodone to use sparingly.  Discussed with him and his wife.  He'll let me know next couple of weeks how he is doing.

## 2017-02-26 ENCOUNTER — Ambulatory Visit (HOSPITAL_COMMUNITY): Payer: PPO | Attending: Cardiovascular Disease

## 2017-02-26 ENCOUNTER — Other Ambulatory Visit: Payer: Self-pay

## 2017-02-26 DIAGNOSIS — I420 Dilated cardiomyopathy: Secondary | ICD-10-CM | POA: Diagnosis not present

## 2017-02-26 DIAGNOSIS — I340 Nonrheumatic mitral (valve) insufficiency: Secondary | ICD-10-CM | POA: Diagnosis not present

## 2017-02-26 DIAGNOSIS — I25709 Atherosclerosis of coronary artery bypass graft(s), unspecified, with unspecified angina pectoris: Secondary | ICD-10-CM

## 2017-02-26 LAB — ECHOCARDIOGRAM COMPLETE

## 2017-02-26 MED ORDER — PERFLUTREN LIPID MICROSPHERE
1.0000 mL | INTRAVENOUS | Status: AC | PRN
  Administered 2017-02-26: 2 mL via INTRAVENOUS

## 2017-04-16 DIAGNOSIS — B0229 Other postherpetic nervous system involvement: Secondary | ICD-10-CM | POA: Diagnosis not present

## 2017-04-16 DIAGNOSIS — L814 Other melanin hyperpigmentation: Secondary | ICD-10-CM | POA: Diagnosis not present

## 2017-04-16 DIAGNOSIS — D225 Melanocytic nevi of trunk: Secondary | ICD-10-CM | POA: Diagnosis not present

## 2017-04-16 DIAGNOSIS — L570 Actinic keratosis: Secondary | ICD-10-CM | POA: Diagnosis not present

## 2017-04-16 DIAGNOSIS — L720 Epidermal cyst: Secondary | ICD-10-CM | POA: Diagnosis not present

## 2017-04-16 DIAGNOSIS — L821 Other seborrheic keratosis: Secondary | ICD-10-CM | POA: Diagnosis not present

## 2017-04-18 ENCOUNTER — Encounter: Payer: Self-pay | Admitting: Neurology

## 2017-04-28 NOTE — Progress Notes (Signed)
Subjective:   Joshua [name]. was seen in consultation in the movement disorder clinic at the request of Dickie La, MD.  The evaluation is for tremor. This patient is accompanied in the office by his spouse who supplements the history.  Pt reports that he has tremor of the tongue x 6 months.  States that it doesn't move all of the time but off and on.  It has become more frequent.  Pt states that he can control it if he stops thinking about it it starts again.  Never been on reglan or exposure to antipsychotics.  Wife states that sometimes the hands will shake, more the right hand than the L.  Balance is good.  Speech is good.  Swallowing is good.  No diplopia.  No hx of neuroimaging.  Doesn't hear clicking in the ear.    Admits to RLS, only when sits in the recliner at night.  If he gets up it goes away.  Doesn't bother him when lays down but does when he sits to watch TV.  Does have bad leg cramps at night.    04/30/17 update:  Patient seen today in follow-up, although this is for a very different reason.  I have reviewed records since last visit.  The patient reports that he developed shingles in February.  He had severe pain with this.  Pain is described as scratching and burning.  It comes and goes. He was initially started on tramadol, which did not control the pain.  On February 22, he was started on gabapentin.  He has worked up to 900 mg 3 times per day.  He tried to wean off of it and it got worse so he does know that it is helping.  Pain is better than it was.    Hydrocodone was also prescribed for breakthrough pain.  At the end of march, low dose amitriptyline, 25 mg was started.  He is sleeping well but was sleeping well even before this medication.  Daytime is the biggest issue.      Allergies  Allergen Reactions  . Ace Inhibitors     REACTION: cough  . Atorvastatin   . Plavix [Clopidogrel Bisulfate]   . Rosuvastatin   . Sulfonamide Derivatives     Outpatient Encounter  Prescriptions as of 04/30/2017  Medication Sig  . amitriptyline (ELAVIL) 25 MG tablet Take 1 tablet (25 mg total) by mouth at bedtime.  Marland Kitchen aspirin 325 MG tablet Take 325 mg by mouth daily.  Marland Kitchen atenolol-chlorthalidone (TENORETIC) 100-25 MG tablet Take 1 tablet by mouth daily.  . cetirizine (ZYRTEC) 10 MG tablet Take 10 mg by mouth daily.  . citalopram (CELEXA) 20 MG tablet Take 1 tablet (20 mg total) by mouth daily.  Marland Kitchen esomeprazole (NEXIUM) 20 MG capsule Take 1 capsule (20 mg total) by mouth daily at 12 noon.  . ezetimibe (ZETIA) 10 MG tablet Take 1 tablet (10 mg total) by mouth daily.  Marland Kitchen gabapentin (NEURONTIN) 300 MG capsule Taper up to 3 tabs tid as directed (Patient taking differently: Take 900 mg by mouth 3 (three) times daily. )  . simvastatin (ZOCOR) 40 MG tablet Take 1 tablet (40 mg total) by mouth daily.  Marland Kitchen gabapentin (NEURONTIN) 600 MG tablet Take 1 tablet (600 mg total) by mouth 4 (four) times daily.  . [DISCONTINUED] gabapentin (NEURONTIN) 100 MG capsule Take 1 capsule (100 mg total) by mouth 3 (three) times daily.  . [DISCONTINUED] HYDROcodone-acetaminophen (NORCO) 10-325 MG tablet Take  1 tablet by mouth every 6 (six) hours as needed.   No facility-administered encounter medications on file as of 04/30/2017.     Past Medical History:  Diagnosis Date  . CAD (coronary artery disease)   . Chest pain, unspecified   . Coronary atherosclerosis of native coronary artery   . GERD (gastroesophageal reflux disease)   . Hyperlipidemia   . Hypertension     Past Surgical History:  Procedure Laterality Date  . CATARACT EXTRACTION, BILATERAL    . CORONARY ARTERY BYPASS GRAFT  1993   4V  . CORONARY STENT PLACEMENT      Social History   Social History  . Marital status: Married    Spouse name: N/A  . Number of children: N/A  . Years of education: N/A   Occupational History  . retired     Retail buyer (truck driver, heating and air)   Social History Main Topics  . Smoking  status: Never Smoker  . Smokeless tobacco: Never Used  . Alcohol use 0.0 oz/week     Comment: once a month  . Drug use: No  . Sexual activity: Yes   Other Topics Concern  . Not on file   Social History Narrative  . No narrative on file    Family Status  Relation Status  . Mother Deceased at age 76       Ovarian cancer  . Son Alive       healthy  . Father Deceased at age 70       CHF  . Daughter Alive       healthy  . Brother Alive       healthy    Review of Systems A complete 10 system ROS was obtained and was negative apart from what is mentioned.   Objective:   VITALS:   Vitals:   04/30/17 1104  BP: 98/60  Pulse: 64  SpO2: 96%  Weight: 225 lb (102.1 kg)  Height: 5\' 8"  (1.727 m)   Pt undressed and placed into examining shorts and gown  Gen:  Appears stated age and in NAD. HEENT:  Normocephalic, atraumatic. The mucous membranes are moist. The superficial temporal arteries are without ropiness or tenderness. Cardiovascular: Regular rate and rhythm. Lungs: Clear to auscultation bilaterally. Neck: There are no carotid bruits noted bilaterally. Skin:  No rashes.    NEUROLOGICAL:  Orientation:  The patient is alert and oriented x 3.  Recent and remote memory are intact.  Attention span and concentration are normal.  Able to name objects and repeat without trouble.  Fund of knowledge is appropriate Cranial nerves: There is good facial symmetry. The pupils are equal round and reactive to light bilaterally. Fundoscopic exam reveals clear disc margins bilaterally. Extraocular muscles are intact and visual fields are full to confrontational testing. Speech is fluent and clear. Soft palate rises symmetrically and there is no tongue deviation. Hearing is intact to conversational tone. Tone: Tone is good throughout. Sensation: Sensation is intact to light touch throughout Coordination:  The patient has no dysdiadichokinesia or dysmetria. Motor: Strength is 5/5 in the  bilateral upper and lower extremities.  Shoulder shrug is equal bilaterally.  There is no pronator drift.  There are no fasciculations noted. DTR's: Deep tendon reflexes are 2-/4 at the bilateral biceps, triceps, brachioradialis, patella and achilles.  Plantar response is downgoing on the r and neutral on the L Gait and Station: The patient is able to ambulate without difficulty. He is wide based  Labs:  Lab Results  Component Value Date   TSH 3.15 07/16/2016     Chemistry      Component Value Date/Time   NA 140 05/14/2016 1234   K 3.8 05/14/2016 1234   CL 101 05/14/2016 1234   CO2 21 05/14/2016 1234   BUN 19 05/14/2016 1234   CREATININE 0.87 05/14/2016 1234      Component Value Date/Time   CALCIUM 9.3 05/14/2016 1234   ALKPHOS 53 07/16/2016 1142   AST 54 (H) 07/16/2016 1142   ALT 42 07/16/2016 1142   BILITOT 0.5 07/16/2016 1142     Lab Results  Component Value Date   WBC 7.8 07/16/2016   HGB 14.5 07/16/2016   HCT 42.6 07/16/2016   MCV 86.8 07/16/2016   PLT 186 07/16/2016   Lab Results  Component Value Date   VITAMINB12 244 07/16/2016        Assessment/Plan:   1.  Post herpetic neuralgia  -Talked to him about the fact that this can be very resistant to treatment.  He is already gabapentin, 900 mg 3 times per day and small dose of amitriptyline, 25 mg nightly.  We talked about our options, including changing his gabapentin to Lyrica.  However, it sounds like the gabapentin is actually working.  Told him we could try to increase to max dose of 900 mg qid, but also could make sleepiness worse.  He would like to try this.  Gave RX for the 600 mg dosage that they can add to the 300 mg.  In addition, topical lidocaine cream can work and he is using aspercreme.  Told him to keep it away from the eye itself.  Told him we could try trileptal if above gets worse worse.  There have been some studies on botox as well if this turns out to be refractory.  He will let me know how he  does and f/u prn.  Much greater than 50% of this visit was spent in counseling and coordinating care.  Total face to face time:  30 min

## 2017-04-30 ENCOUNTER — Encounter: Payer: Self-pay | Admitting: Neurology

## 2017-04-30 ENCOUNTER — Ambulatory Visit (INDEPENDENT_AMBULATORY_CARE_PROVIDER_SITE_OTHER): Payer: PPO | Admitting: Neurology

## 2017-04-30 VITALS — BP 98/60 | HR 64 | Ht 68.0 in | Wt 225.0 lb

## 2017-04-30 DIAGNOSIS — B0229 Other postherpetic nervous system involvement: Secondary | ICD-10-CM

## 2017-04-30 MED ORDER — GABAPENTIN 600 MG PO TABS: 600.0000 mg | ORAL_TABLET | Freq: Four times a day (QID) | ORAL | 3 refills | Status: DC

## 2017-04-30 NOTE — Patient Instructions (Signed)
1.  Increase gabapentin to 900 mg four times per day if you need it.  If it gets better you can start to wean off of it slowly 2.  Call me in a month if you feel that you are not doing better or just make another appointment 3.  Good to see you!

## 2017-05-13 ENCOUNTER — Other Ambulatory Visit: Payer: Self-pay | Admitting: Family Medicine

## 2017-05-18 ENCOUNTER — Telehealth: Payer: Self-pay | Admitting: *Deleted

## 2017-05-18 NOTE — Telephone Encounter (Signed)
Prior Authorization received from [place] for amitriptyline 25 mg. PA was approved via [employer] until 11/30/2017. Derl Barrow, RN

## 2017-06-23 ENCOUNTER — Encounter (HOSPITAL_COMMUNITY): Payer: Self-pay

## 2017-06-23 ENCOUNTER — Telehealth: Payer: Self-pay | Admitting: Interventional Cardiology

## 2017-06-23 ENCOUNTER — Inpatient Hospital Stay (HOSPITAL_COMMUNITY)
Admission: EM | Admit: 2017-06-23 | Discharge: 2017-06-25 | DRG: 246 | Disposition: A | Discharge status: Home / Self Care | Payer: PPO | Attending: Internal Medicine | Admitting: Internal Medicine

## 2017-06-23 ENCOUNTER — Emergency Department (HOSPITAL_COMMUNITY): Payer: PPO

## 2017-06-23 DIAGNOSIS — K219 Gastro-esophageal reflux disease without esophagitis: Secondary | ICD-10-CM | POA: Diagnosis present

## 2017-06-23 DIAGNOSIS — I214 Non-ST elevation (NSTEMI) myocardial infarction: Secondary | ICD-10-CM | POA: Diagnosis present

## 2017-06-23 DIAGNOSIS — I25709 Atherosclerosis of coronary artery bypass graft(s), unspecified, with unspecified angina pectoris: Secondary | ICD-10-CM | POA: Diagnosis not present

## 2017-06-23 DIAGNOSIS — R0602 Shortness of breath: Secondary | ICD-10-CM | POA: Diagnosis not present

## 2017-06-23 DIAGNOSIS — I10 Essential (primary) hypertension: Secondary | ICD-10-CM | POA: Diagnosis present

## 2017-06-23 DIAGNOSIS — B0229 Other postherpetic nervous system involvement: Secondary | ICD-10-CM | POA: Diagnosis present

## 2017-06-23 DIAGNOSIS — I252 Old myocardial infarction: Secondary | ICD-10-CM | POA: Diagnosis not present

## 2017-06-23 DIAGNOSIS — Z955 Presence of coronary angioplasty implant and graft: Secondary | ICD-10-CM | POA: Diagnosis not present

## 2017-06-23 DIAGNOSIS — I2510 Atherosclerotic heart disease of native coronary artery without angina pectoris: Secondary | ICD-10-CM | POA: Diagnosis not present

## 2017-06-23 DIAGNOSIS — Z882 Allergy status to sulfonamides status: Secondary | ICD-10-CM | POA: Diagnosis not present

## 2017-06-23 DIAGNOSIS — Y831 Surgical operation with implant of artificial internal device as the cause of abnormal reaction of the patient, or of later complication, without mention of misadventure at the time of the procedure: Secondary | ICD-10-CM | POA: Diagnosis present

## 2017-06-23 DIAGNOSIS — Z888 Allergy status to other drugs, medicaments and biological substances status: Secondary | ICD-10-CM | POA: Diagnosis not present

## 2017-06-23 DIAGNOSIS — Z951 Presence of aortocoronary bypass graft: Secondary | ICD-10-CM | POA: Diagnosis not present

## 2017-06-23 DIAGNOSIS — T82858A Stenosis of vascular prosthetic devices, implants and grafts, initial encounter: Secondary | ICD-10-CM | POA: Diagnosis present

## 2017-06-23 DIAGNOSIS — Z79899 Other long term (current) drug therapy: Secondary | ICD-10-CM | POA: Diagnosis not present

## 2017-06-23 DIAGNOSIS — Z881 Allergy status to other antibiotic agents status: Secondary | ICD-10-CM | POA: Diagnosis not present

## 2017-06-23 DIAGNOSIS — E785 Hyperlipidemia, unspecified: Secondary | ICD-10-CM | POA: Diagnosis present

## 2017-06-23 DIAGNOSIS — Z6834 Body mass index (BMI) 34.0-34.9, adult: Secondary | ICD-10-CM | POA: Diagnosis not present

## 2017-06-23 DIAGNOSIS — I25700 Atherosclerosis of coronary artery bypass graft(s), unspecified, with unstable angina pectoris: Secondary | ICD-10-CM | POA: Diagnosis present

## 2017-06-23 DIAGNOSIS — I200 Unstable angina: Secondary | ICD-10-CM | POA: Diagnosis not present

## 2017-06-23 DIAGNOSIS — Z7982 Long term (current) use of aspirin: Secondary | ICD-10-CM | POA: Diagnosis not present

## 2017-06-23 DIAGNOSIS — R0789 Other chest pain: Secondary | ICD-10-CM | POA: Diagnosis present

## 2017-06-23 DIAGNOSIS — I25710 Atherosclerosis of autologous vein coronary artery bypass graft(s) with unstable angina pectoris: Secondary | ICD-10-CM | POA: Diagnosis not present

## 2017-06-23 DIAGNOSIS — Z8249 Family history of ischemic heart disease and other diseases of the circulatory system: Secondary | ICD-10-CM

## 2017-06-23 DIAGNOSIS — R079 Chest pain, unspecified: Secondary | ICD-10-CM | POA: Diagnosis not present

## 2017-06-23 DIAGNOSIS — R748 Abnormal levels of other serum enzymes: Secondary | ICD-10-CM | POA: Diagnosis not present

## 2017-06-23 HISTORY — DX: Personal history of other diseases of the digestive system: Z87.19

## 2017-06-23 HISTORY — DX: Atherosclerotic heart disease of native coronary artery with unspecified angina pectoris: I25.119

## 2017-06-23 HISTORY — DX: Obesity, unspecified: E66.9

## 2017-06-23 LAB — CBC
HCT: 41.5 % (ref 39.0–52.0)
Hemoglobin: 13.8 g/dL (ref 13.0–17.0)
MCH: 28.8 pg (ref 26.0–34.0)
MCHC: 33.3 g/dL (ref 30.0–36.0)
MCV: 86.6 fL (ref 78.0–100.0)
PLATELETS: 165 10*3/uL (ref 150–400)
RBC: 4.79 MIL/uL (ref 4.22–5.81)
RDW: 14.3 % (ref 11.5–15.5)
WBC: 7.1 10*3/uL (ref 4.0–10.5)

## 2017-06-23 LAB — BASIC METABOLIC PANEL
Anion gap: 8 (ref 5–15)
BUN: 14 mg/dL (ref 6–20)
CALCIUM: 8.9 mg/dL (ref 8.9–10.3)
CO2: 26 mmol/L (ref 22–32)
CREATININE: 1.04 mg/dL (ref 0.61–1.24)
Chloride: 104 mmol/L (ref 101–111)
GFR calc non Af Amer: >60 mL/min (ref >60)
Glucose, Bld: 99 mg/dL (ref 65–99)
Potassium: 3.7 mmol/L (ref 3.5–5.1)
SODIUM: 138 mmol/L (ref 135–145)

## 2017-06-23 LAB — PROTIME-INR
INR: 1.03
PROTHROMBIN TIME: 13.5 s (ref 11.4–15.2)

## 2017-06-23 LAB — TROPONIN I: Troponin I: 0.03 ng/mL (ref <0.03)

## 2017-06-23 LAB — I-STAT TROPONIN, ED: TROPONIN I, POC: 0 ng/mL (ref 0.00–0.08)

## 2017-06-23 MED ORDER — ONDANSETRON HCL 4 MG/2ML IJ SOLN: 4.0000 mg | Freq: Four times a day (QID) | INTRAMUSCULAR | Status: DC | PRN

## 2017-06-23 MED ORDER — CITALOPRAM HYDROBROMIDE 20 MG PO TABS
20.0000 mg | ORAL_TABLET | Freq: Every day | ORAL | Status: DC
  Administered 2017-06-24 – 2017-06-25 (×2): 20 mg via ORAL
  Filled 2017-06-23: qty 1
  Filled 2017-06-23: qty 2

## 2017-06-23 MED ORDER — SIMVASTATIN 20 MG PO TABS
40.0000 mg | ORAL_TABLET | Freq: Every day | ORAL | Status: DC
  Administered 2017-06-23 – 2017-06-24 (×2): 40 mg via ORAL
  Filled 2017-06-23: qty 1
  Filled 2017-06-23: qty 2

## 2017-06-23 MED ORDER — HEPARIN SODIUM (PORCINE) 5000 UNIT/ML IJ SOLN
5000.0000 [IU] | Freq: Three times a day (TID) | INTRAMUSCULAR | Status: DC
  Administered 2017-06-23 – 2017-06-24 (×2): 5000 [IU] via SUBCUTANEOUS
  Filled 2017-06-23 (×2): qty 1

## 2017-06-23 MED ORDER — ASPIRIN 81 MG PO CHEW
81.0000 mg | CHEWABLE_TABLET | ORAL | Status: AC
  Administered 2017-06-24: 81 mg via ORAL
  Filled 2017-06-23: qty 1

## 2017-06-23 MED ORDER — EZETIMIBE 10 MG PO TABS
10.0000 mg | ORAL_TABLET | Freq: Every day | ORAL | Status: DC
  Administered 2017-06-23 – 2017-06-24 (×2): 10 mg via ORAL
  Filled 2017-06-23 (×2): qty 1

## 2017-06-23 MED ORDER — SODIUM CHLORIDE 0.9 % IV SOLN: 250.0000 mL | INTRAVENOUS | Status: DC | PRN

## 2017-06-23 MED ORDER — LORATADINE 10 MG PO TABS
10.0000 mg | ORAL_TABLET | Freq: Every day | ORAL | Status: DC
  Administered 2017-06-24 – 2017-06-25 (×2): 10 mg via ORAL
  Filled 2017-06-23 (×2): qty 1

## 2017-06-23 MED ORDER — GABAPENTIN 600 MG PO TABS: 600.0000 mg | ORAL_TABLET | Freq: Three times a day (TID) | ORAL | Status: DC

## 2017-06-23 MED ORDER — SODIUM CHLORIDE 0.9 % WEIGHT BASED INFUSION
3.0000 mL/kg/h | INTRAVENOUS | Status: DC
  Administered 2017-06-24: 3 mL/kg/h via INTRAVENOUS

## 2017-06-23 MED ORDER — PANTOPRAZOLE SODIUM 40 MG PO TBEC
40.0000 mg | DELAYED_RELEASE_TABLET | Freq: Every day | ORAL | Status: DC
  Administered 2017-06-24 – 2017-06-25 (×2): 40 mg via ORAL
  Filled 2017-06-23 (×2): qty 1

## 2017-06-23 MED ORDER — SODIUM CHLORIDE 0.9% FLUSH: 3.0000 mL | Freq: Two times a day (BID) | INTRAVENOUS | Status: DC

## 2017-06-23 MED ORDER — SODIUM CHLORIDE 0.9% FLUSH: 3.0000 mL | INTRAVENOUS | Status: DC | PRN

## 2017-06-23 MED ORDER — GABAPENTIN 400 MG PO CAPS
800.0000 mg | ORAL_CAPSULE | Freq: Three times a day (TID) | ORAL | Status: DC
  Administered 2017-06-23 – 2017-06-25 (×4): 800 mg via ORAL
  Filled 2017-06-23 (×4): qty 2

## 2017-06-23 MED ORDER — CHLORTHALIDONE 25 MG PO TABS
25.0000 mg | ORAL_TABLET | Freq: Every day | ORAL | Status: DC
  Administered 2017-06-24 – 2017-06-25 (×2): 25 mg via ORAL
  Filled 2017-06-23 (×2): qty 1

## 2017-06-23 MED ORDER — ASPIRIN 325 MG PO TABS
325.0000 mg | ORAL_TABLET | Freq: Every day | ORAL | Status: DC
Start: 2017-06-23 — End: 2017-06-23

## 2017-06-23 MED ORDER — ASPIRIN EC 81 MG PO TBEC
81.0000 mg | DELAYED_RELEASE_TABLET | Freq: Every day | ORAL | Status: DC
  Administered 2017-06-24 – 2017-06-25 (×2): 81 mg via ORAL
  Filled 2017-06-23 (×2): qty 1

## 2017-06-23 MED ORDER — SODIUM CHLORIDE 0.9 % WEIGHT BASED INFUSION
1.0000 mL/kg/h | INTRAVENOUS | Status: DC
  Administered 2017-06-24: 1 mL/kg/h via INTRAVENOUS

## 2017-06-23 MED ORDER — AMITRIPTYLINE HCL 25 MG PO TABS
25.0000 mg | ORAL_TABLET | Freq: Every day | ORAL | Status: DC
  Administered 2017-06-23 – 2017-06-24 (×2): 25 mg via ORAL
  Filled 2017-06-23 (×2): qty 1

## 2017-06-23 MED ORDER — SODIUM CHLORIDE 0.9% FLUSH
3.0000 mL | Freq: Two times a day (BID) | INTRAVENOUS | Status: DC
  Administered 2017-06-23: 3 mL via INTRAVENOUS

## 2017-06-23 MED ORDER — ATENOLOL 50 MG PO TABS
100.0000 mg | ORAL_TABLET | Freq: Every day | ORAL | Status: DC
  Administered 2017-06-24 – 2017-06-25 (×2): 100 mg via ORAL
  Filled 2017-06-23: qty 4
  Filled 2017-06-23: qty 2

## 2017-06-23 MED ORDER — NITROGLYCERIN 0.4 MG SL SUBL: 0.4000 mg | SUBLINGUAL_TABLET | SUBLINGUAL | Status: DC | PRN

## 2017-06-23 MED ORDER — ACETAMINOPHEN 325 MG PO TABS: 650.0000 mg | ORAL_TABLET | ORAL | Status: DC | PRN

## 2017-06-23 MED ORDER — ATENOLOL-CHLORTHALIDONE 100-25 MG PO TABS: 1.0000 | ORAL_TABLET | Freq: Every day | ORAL | Status: DC

## 2017-06-23 NOTE — ED Notes (Signed)
Cardiologist in to assess pt at this time.   

## 2017-06-23 NOTE — H&P (Signed)
History & Physical    Patient ID: Joshua [name]. MRN: [id number], DOB/AGE: Dec 01, 1941   Admit date: 06/23/2017   Primary Physician: Dickie La, MD Primary Cardiologist: Linard Millers, MD   Patient Profile    76 year old male with a CAD status post remote CABG, hypertension, hyperlipidemia, GERD, and obesity, who presents to the emergency department with a 3 to four-day history of exertional chest pressure.  Past Medical History    Past Medical History:  Diagnosis Date  . CAD (coronary artery disease)    a. 1990 s/p CABG x 4 (RIMA->LAD, LIMA->LCX, VG->Diag, VG->RCA); b. 2011 s/p BMS to VG->RCA.  Marland Kitchen Chest pain, unspecified   . Dyspnea on exertion    a. 01/2017 Echo: EF 55-60%, no rwma, mild MR, midlly dil LA, PASP 75mmHg.  Marland Kitchen GERD (gastroesophageal reflux disease)   . Hyperlipidemia   . Hypertension   . Obesity     Past Surgical History:  Procedure Laterality Date  . CATARACT EXTRACTION, BILATERAL    . CORONARY ARTERY BYPASS GRAFT  1993   4V  . CORONARY STENT PLACEMENT       Allergies  Allergies  Allergen Reactions  . Ace Inhibitors Cough  . Atorvastatin Other (See Comments)    Muscle pain  . Rosuvastatin Other (See Comments)    Muscle pain  . Plavix [Clopidogrel Bisulfate] Itching and Rash  . Sulfa Antibiotics Itching and Rash  . Sulfonamide Derivatives Itching and Rash    History of Present Illness    76 year old male with the above past medical history including coronary artery disease status post 4 vessel bypass surgery in 1990. He subsequently required bare-metal stenting of the vein graft to the right coronary artery in 2011. Other history includes hypertension, hyperlipidemia, obesity, and GERD. He is retired and lives locally with his wife. He is followed closely by Dr. Tamala Julian in the outpatient setting. Earlier this year, he reported dyspnea on exertion and underwent echocardiography, which showed normal LV function. Over the past few months, he has noted  stable dyspnea on exertion and has also gained about 15 pounds, which he attributes to a ferocious appetite and inactivity. Beginning about 3-4 days ago, he began to experience exertional substernal chest pressure with any activity throughout the day, lasting just a few minutes, resolving with rest. Because of progressive symptoms, his wife called the office today and was advised to present to the emergency department for further evaluation. Here, ECG is nonacute and initial troponin is normal. He is currently chest pain-free.  Home Medications    Prior to Admission medications   Medication Sig Start Date End Date Taking? Authorizing Provider  amitriptyline (ELAVIL) 25 MG tablet TAKE 1 TABLET(25 MG) BY MOUTH AT BEDTIME. Patient taking differently: Take 25 mg by mouth at bedtime 05/13/17  Yes Dickie La, MD  aspirin 325 MG tablet Take 325 mg by mouth daily.   Yes [provider]  atenolol-chlorthalidone (TENORETIC) 100-25 MG tablet Take 1 tablet by mouth daily. 12/19/16  Yes Dickie La, MD  cetirizine (ZYRTEC) 10 MG tablet Take 10 mg by mouth daily.   Yes [provider]  citalopram (CELEXA) 20 MG tablet Take 1 tablet (20 mg total) by mouth daily. 12/16/16  Yes Dickie La, MD  esomeprazole (NEXIUM) 20 MG capsule Take 1 capsule (20 mg total) by mouth daily at 12 noon. Patient taking differently: Take 20 mg by mouth daily before breakfast.  11/28/16  Yes Dickie La, MD  ezetimibe (  ZETIA) 10 MG tablet Take 1 tablet (10 mg total) by mouth daily. Patient taking differently: Take 10 mg by mouth at bedtime.  12/16/16  Yes Dickie La, MD  gabapentin (NEURONTIN) 100 MG capsule Take 200 mg by mouth 3 (three) times daily. IN CONJUNCTION WITH ONE 600 MG TABLET TO EQUAL A TOTAL DOSE OF 800 MILLIGRAMS 06/19/17  Yes [provider]  gabapentin (NEURONTIN) 600 MG tablet Take 1 tablet (600 mg total) by mouth 4 (four) times daily. Patient taking differently: Take 600 mg by mouth 3  (three) times daily. IN CONJUNCTION WITH TWO 100 MG CAPSULES TO EQUAL A TOTAL DOSE OF 800 MILLIGRAMS 04/30/17  Yes Tat, Eustace Quail, DO  Multiple Vitamins-Minerals (CENTRUM SILVER 50+MEN) TABS Take 1 tablet by mouth daily.   Yes [provider]  simvastatin (ZOCOR) 40 MG tablet Take 1 tablet (40 mg total) by mouth daily. Patient taking differently: Take 40 mg by mouth at bedtime.  12/16/16  Yes Dickie La, MD  vitamin B-12 (CYANOCOBALAMIN) 1000 MCG tablet Take 1,000 mcg by mouth daily.   Yes [provider]  gabapentin (NEURONTIN) 300 MG capsule Taper up to 3 tabs tid as directed Patient not taking: Reported on 06/23/2017 02/18/17   Dickie La, MD    Family History    Family History  Problem Relation Age of Onset  . Cancer Mother        ovarian  . Hyperlipidemia Son   . Hypertension Son   . Heart attack Father   . Coronary artery disease Father     Social History    Social History   Social History  . Marital status: Married    Spouse name: N/A  . Number of children: N/A  . Years of education: N/A   Occupational History  . retired     Retail buyer (truck driver, heating and air)   Social History Main Topics  . Smoking status: Never Smoker  . Smokeless tobacco: Never Used  . Alcohol use 0.0 oz/week     Comment: once a month  . Drug use: No  . Sexual activity: Yes   Other Topics Concern  . Not on file   Social History Narrative   Lives locally with his wife [name] - former [employer] ECG tech).  Does not routinely exercise.     Review of Systems    General:  No chills, fever, night sweats or weight changes.  Cardiovascular:  Exertional+  chest pain, +++ dyspnea on exertion,  no edema, orthopnea, palpitations, paroxysmal nocturnal dyspnea. Dermatological: No rash, lesions/masses Respiratory: No cough, +++ dyspnea Urologic: No hematuria, dysuria Abdominal:   No nausea, vomiting, diarrhea, bright red blood per rectum, melena, or hematemesis Neurologic:   No visual changes, wkns, changes in mental status. All other systems reviewed and are otherwise negative except as noted above.  Physical Exam    Blood pressure (!) 143/65, pulse 63, temperature 98.1 F (36.7 C), temperature source Oral, resp. rate 20, height 5\' 8"  (1.727 m), weight 220 lb (99.8 kg), SpO2 93 %.  General: Pleasant, NAD Psych: Normal affect. Neuro: Alert and oriented X 3. Moves all extremities spontaneously. HEENT: Normal  Neck: Supple without bruits or JVD. Lungs:  Resp regular and unlabored, CTA. Heart: RRR no s3, s4, or murmurs. Abdomen: Soft, non-tender, non-distended, BS + x 4.  Extremities: No clubbing, cyanosis or edema. DP/PT/Radials 2+ and equal bilaterally.  Labs    Troponin [facility] of [facility] Test)  Recent Labs  06/23/17  1539  TROPIPOC 0.00   Lab Results  Component Value Date   WBC 7.1 06/23/2017   HGB 13.8 06/23/2017   HCT 41.5 06/23/2017   MCV 86.6 06/23/2017   PLT 165 06/23/2017    Recent Labs Lab 06/23/17 1514  NA 138  K 3.7  CL 104  CO2 26  BUN 14  CREATININE 1.04  CALCIUM 8.9  GLUCOSE 99   Lab Results  Component Value Date   CHOL 139 12/14/2014   HDL 25 (L) 12/14/2014   LDLCALC 68 12/14/2014   TRIG 231 (H) 12/14/2014    Radiology Studies    Dg Chest 2 View  Result Date: 06/23/2017 CLINICAL DATA:  3-4 day history of gradually increasing chest pain and shortness of breath with exertion. Prior CABG. EXAM: CHEST  2 VIEW COMPARISON:  09/03/2012, 02/08/2010. FINDINGS: Sternotomy for CABG. Cardiac silhouette mildly enlarged, unchanged. Thoracic aorta mildly tortuous and atherosclerotic, unchanged. Hilar and mediastinal contours otherwise unremarkable. Mildly prominent bronchovascular markings diffusely and mild central peribronchial thickening, more so than on prior examinations. Lungs otherwise clear. No localized airspace consolidation. No pleural effusions. No pneumothorax. Normal pulmonary vascularity. Degenerative changes involving  the thoracic spine. IMPRESSION: 1. Mild changes of acute bronchitis and/or asthma. No acute cardiopulmonary disease otherwise. 2. Stable mild cardiomegaly without pulmonary edema. 3.  Aortic Atherosclerosis (ICD10-170.0) Electronically Signed   By: Evangeline Dakin M.D.   On: 06/23/2017 15:33    ECG & Cardiac Imaging    Regular sinus rhythm, 67, right bundle branch block, no acute ST or T changes.   Assessment & Plan    1. Unstable angina/coronary artery disease: Patient has a history of remote coronary artery bypass grafting 4 in 1990 with subsequent stenting of the vein graft to the right coronary artery in 2011. He has done well over the past 2 years but began to express dyspnea on exertion earlier this year. Echocardiogram showed normal LV function without wall motion abnormalities. Over the past 3-4 days, he has been experiencing intermittent exertional substernal chest pressure that lasts just a few minutes, and resolves with rest. Because of progressive symptoms, he called the office today was advised to present to the emergency department. Here, ECG is nonacute and troponin is normal. He is currently symptom-free. Plan to admit and cycle cardiac enzymes. Continue aspirin (reduce to 81 mg daily), beta blocker, statin, and Zetia. We will plan on diagnostic catheterization tomorrow. The patient understands that risks include but are not limited to stroke (1 in 1000), death (1 in 41), kidney failure [usually temporary] (1 in 500), bleeding (1 in 200), allergic reaction [possibly serious] (1 in 200), and agrees to proceed.    2. Essential hypertension: Blood pressure is mildly elevated here in the emergency department. Resume home medications and adjust as needed.  3. Hyperlipidemia: LDL was 68 earlier this year. Continue statin and Zetia therapy.   4. Morbid obesity: Patient reports inactivity at home with about 15 pound weight gain over a relatively short period of time. He does not have any  evidence of volume overload. He will benefit from outpatient nutritional counseling and more likely cardiac rehabilitation.   5. Post herpetic neuralgia: Patient had shingles in February and has had ongoing discomfort. He is now on gabapentin. Continue while hospitalized.  Signed, Murray Hodgkins, NP 06/23/2017, 7:54 PM

## 2017-06-23 NOTE — Telephone Encounter (Signed)
Spoke with wife, DPR on file.  States pt went out to prove that he could carry a ladder and go up and down it and develop some CP.  Pt became diaphoretic, pale and SOB.  Wife states at this time he confessed that he had been having episodes of CP for 3-4 days.  States they only occur with exertion.  Has not taken Nitro d/t not having any on hand.  Has had 2-3 episodes today, with the last one being about 10 mins prior to call.  Wife states sx are very similar to when pt had bypass and stents.  Sx resolve with rest.  Wife hoping to get pt in for appt soon for eval vs going to ER.  Spoke with Dr. Burt Knack, DOD and he said to have pt report to ER.  Advised wife of Dr. Antionette Char recommendations.  She verbalized understanding and was in agreement with this plan.

## 2017-06-23 NOTE — ED Notes (Signed)
Attempted report 

## 2017-06-23 NOTE — Telephone Encounter (Signed)
New message    Pt wife is calling for pt.   Pt c/o of Chest Pain: STAT if CP now or developed within 24 hours  1. Are you having CP right now? Had today-not right now.  2. Are you experiencing any other symptoms (ex. SOB, nausea, vomiting, sweating)? No-just dull exertional pain that goes away with rest   3. How long have you been experiencing CP? 3-4 days  4. Is your CP continuous or coming and going? Coming and going  5. Have you taken Nitroglycerin? No, pt does not have any. ?

## 2017-06-23 NOTE — ED Provider Notes (Signed)
[facility] DEPT Provider Note   CSN: [id number] Arrival date & time: 06/23/17  1440     History   Chief Complaint Chief Complaint  Patient presents with  . Chest Pain    HPI Joshua [name]. is a 76 y.o. male.  HPI   Patient is 76 year old male with past medical history significant for CABG remotely and then stent placed in 2011. He is a patient of Dr. Thompson Caul. Patient's been having exertional chest pain for last 3 days. Every time he goes upstairs, claims ladder, or exerts himself he develops chest pain similar to his chest pain prior to his CABG. He gets diaphoretic, shortness of breath and pale and clammy. Patient's wife called the cardiologist office today and was sent here for admission.  Patient's last provocative testing was on March 2018 showing normal LV size and function.  Patient is on aspirin, allergic to Plavix.  Past Medical History:  Diagnosis Date  . CAD (coronary artery disease)   . Chest pain, unspecified   . Coronary atherosclerosis of native coronary artery   . GERD (gastroesophageal reflux disease)   . Hyperlipidemia   . Hypertension     Patient Active Problem List   Diagnosis Date Noted  . Absent pedal pulses 02/09/2017  . Postherpetic neuralgia 01/02/2017  . Tremor 05/15/2016  . Irritability and anger 05/15/2016  . Seborrheic keratoses 06/16/2014  . Special screening for malignant neoplasm of prostate 06/14/2014  . Rash and nonspecific skin eruption 04/14/2013  . WRIST PAIN, LEFT 11/04/2010  . Retinal hemorrhage 07/23/2010  . Impotence of organic origin 01/16/2010  . HYPERTENSION, BENIGN ESSENTIAL 04/18/2009  . Hyperlipidemia 01/28/2007  . Coronary atherosclerosis 01/28/2007  . HERNIA, HIATAL, NONCONGENITAL 01/28/2007    Past Surgical History:  Procedure Laterality Date  . CATARACT EXTRACTION, BILATERAL    . CORONARY ARTERY BYPASS GRAFT  1993   4V  . CORONARY STENT PLACEMENT         Home Medications    Prior to  Admission medications   Medication Sig Start Date End Date Taking? Authorizing Provider  amitriptyline (ELAVIL) 25 MG tablet TAKE 1 TABLET(25 MG) BY MOUTH AT BEDTIME. Patient taking differently: Take 25 mg by mouth at bedtime 05/13/17  Yes Dickie La, MD  aspirin 325 MG tablet Take 325 mg by mouth daily.   Yes [provider]  atenolol-chlorthalidone (TENORETIC) 100-25 MG tablet Take 1 tablet by mouth daily. 12/19/16  Yes Dickie La, MD  cetirizine (ZYRTEC) 10 MG tablet Take 10 mg by mouth daily.   Yes [provider]  citalopram (CELEXA) 20 MG tablet Take 1 tablet (20 mg total) by mouth daily. 12/16/16  Yes Dickie La, MD  esomeprazole (NEXIUM) 20 MG capsule Take 1 capsule (20 mg total) by mouth daily at 12 noon. Patient taking differently: Take 20 mg by mouth daily before breakfast.  11/28/16  Yes Dickie La, MD  ezetimibe (ZETIA) 10 MG tablet Take 1 tablet (10 mg total) by mouth daily. Patient taking differently: Take 10 mg by mouth at bedtime.  12/16/16  Yes Dickie La, MD  gabapentin (NEURONTIN) 100 MG capsule Take 200 mg by mouth 3 (three) times daily. IN CONJUNCTION WITH ONE 600 MG TABLET TO EQUAL A TOTAL DOSE OF 800 MILLIGRAMS 06/19/17  Yes [provider]  gabapentin (NEURONTIN) 600 MG tablet Take 1 tablet (600 mg total) by mouth 4 (four) times daily. Patient taking differently: Take 600 mg by mouth 3 (three) times daily. IN  CONJUNCTION WITH TWO 100 MG CAPSULES TO EQUAL A TOTAL DOSE OF 800 MILLIGRAMS 04/30/17  Yes Tat, Eustace Quail, DO  Multiple Vitamins-Minerals (CENTRUM SILVER 50+MEN) TABS Take 1 tablet by mouth daily.   Yes [provider]  simvastatin (ZOCOR) 40 MG tablet Take 1 tablet (40 mg total) by mouth daily. Patient taking differently: Take 40 mg by mouth at bedtime.  12/16/16  Yes Dickie La, MD  vitamin B-12 (CYANOCOBALAMIN) 1000 MCG tablet Take 1,000 mcg by mouth daily.   Yes [provider]  gabapentin (NEURONTIN) 300 MG  capsule Taper up to 3 tabs tid as directed Patient not taking: Reported on 06/23/2017 02/18/17   Dickie La, MD    Family History Family History  Problem Relation Age of Onset  . Cancer Mother        ovarian  . Hyperlipidemia Son   . Hypertension Son   . Heart attack Father   . Coronary artery disease Father     Social History Social History  Substance Use Topics  . Smoking status: Never Smoker  . Smokeless tobacco: Never Used  . Alcohol use 0.0 oz/week     Comment: once a month     Allergies   Ace inhibitors; Atorvastatin; Rosuvastatin; Plavix [clopidogrel bisulfate]; Sulfa antibiotics; and Sulfonamide derivatives   Review of Systems Review of Systems  Constitutional: Negative for activity change.  Respiratory: Positive for chest tightness and shortness of breath.   Cardiovascular: Positive for chest pain.  Gastrointestinal: Negative for abdominal pain.     Physical Exam Updated Vital Signs BP (!) 143/65 (BP Location: Left Arm)   Pulse 63   Temp 98.1 F (36.7 C) (Oral)   Resp 20   Ht 5\' 8"  (1.727 m)   Wt 99.8 kg (220 lb)   SpO2 93%   BMI 33.45 kg/m   Physical Exam  Constitutional: He is oriented to person, place, and time. He appears well-nourished.  HENT:  Head: Normocephalic.  Eyes: Conjunctivae are normal.  Cardiovascular: Normal rate and regular rhythm.   No murmur heard. Pulmonary/Chest: Effort normal and breath sounds normal. No respiratory distress. He has no wheezes.  Abdominal: Soft. He exhibits no distension. There is no tenderness.  Neurological: He is oriented to person, place, and time.  Skin: Skin is warm and dry. He is not diaphoretic.  Psychiatric: He has a normal mood and affect. His behavior is normal.     ED Treatments / Results  Labs (all labs ordered are listed, but only abnormal results are displayed) Labs Reviewed  BASIC METABOLIC PANEL  CBC  I-STAT TROPONIN, ED    EKG  EKG Interpretation  Date/Time:  Tuesday June 23 2017 15:01:17 EDT Ventricular Rate:  63 PR Interval:  176 QRS Duration: 146 QT Interval:  448 QTC Calculation: 458 R Axis:   -23 Text Interpretation:  Normal sinus rhythm Right bundle branch block Abnormal ECG Normal sinus rhythm Confirmed by Thomasene Lot, [place] [phone]) on 06/23/2017 5:28:38 PM       Radiology Dg Chest 2 View  Result Date: 06/23/2017 CLINICAL DATA:  3-4 day history of gradually increasing chest pain and shortness of breath with exertion. Prior CABG. EXAM: CHEST  2 VIEW COMPARISON:  09/03/2012, 02/08/2010. FINDINGS: Sternotomy for CABG. Cardiac silhouette mildly enlarged, unchanged. Thoracic aorta mildly tortuous and atherosclerotic, unchanged. Hilar and mediastinal contours otherwise unremarkable. Mildly prominent bronchovascular markings diffusely and mild central peribronchial thickening, more so than on prior examinations. Lungs otherwise clear. No localized airspace consolidation. No  pleural effusions. No pneumothorax. Normal pulmonary vascularity. Degenerative changes involving the thoracic spine. IMPRESSION: 1. Mild changes of acute bronchitis and/or asthma. No acute cardiopulmonary disease otherwise. 2. Stable mild cardiomegaly without pulmonary edema. 3.  Aortic Atherosclerosis (ICD10-170.0) Electronically Signed   By: Evangeline Dakin M.D.   On: 06/23/2017 15:33    Procedures Procedures (including critical care time)  Medications Ordered in ED Medications - No data to display   Initial Impression / Assessment and Plan / ED Course  I have reviewed the triage vital signs and the nursing notes.  Pertinent labs & imaging results that were available during my care of the patient were reviewed by me and considered in my medical decision making (see chart for details).    Patient is 77 year old male with past medical history significant for CABG remotely and then stent placed in 2011. He is a patient of Dr. Thompson Caul. Patient's been having exertional chest pain for  last 3 days. Every time he goes upstairs, claims ladder, or exerts himself he develops chest pain similar to his chest pain prior to his CABG. He gets diaphoretic, shortness of breath and pale and clammy. Patient's wife called the cardiologist office today and was sent here for admission.  Patient's last provocative testing was on March 2018 showing normal LV size and function.  Patient is on aspirin, allergic to Plavix.  We'll consult the cards for unstable angina.  Final Clinical Impressions(s) / ED Diagnoses   Final diagnoses:  None    New Prescriptions New Prescriptions   No medications on file     Macarthur Critchley, MD 06/23/17 2356

## 2017-06-23 NOTE — Progress Notes (Signed)
CRITICAL VALUE ALERT  Critical value received:  Troponin 0.03  Date of notification:  06/23/17  Time of notification:  2328  Critical value read back:Yes.    Nurse who received alert:  Olivia Mackie   MD notified (1st page):  [place]  Time of first page:  2330

## 2017-06-23 NOTE — ED Triage Notes (Signed)
Per Pt, Pt is coming from home with complaints of mid-center chest aching that started about four days ago. Pt reports hx of the same with need for CABG. Complains of some SOB, but denies any N/V/D.

## 2017-06-24 ENCOUNTER — Encounter (HOSPITAL_COMMUNITY)
Admission: EM | Disposition: A | Discharge status: Home / Self Care | Payer: Self-pay | Source: Home / Self Care | Attending: Internal Medicine

## 2017-06-24 ENCOUNTER — Other Ambulatory Visit: Payer: Self-pay

## 2017-06-24 DIAGNOSIS — R0789 Other chest pain: Secondary | ICD-10-CM | POA: Diagnosis present

## 2017-06-24 DIAGNOSIS — I10 Essential (primary) hypertension: Secondary | ICD-10-CM

## 2017-06-24 DIAGNOSIS — Z951 Presence of aortocoronary bypass graft: Secondary | ICD-10-CM | POA: Diagnosis not present

## 2017-06-24 DIAGNOSIS — Y831 Surgical operation with implant of artificial internal device as the cause of abnormal reaction of the patient, or of later complication, without mention of misadventure at the time of the procedure: Secondary | ICD-10-CM | POA: Diagnosis present

## 2017-06-24 DIAGNOSIS — Z882 Allergy status to sulfonamides status: Secondary | ICD-10-CM | POA: Diagnosis not present

## 2017-06-24 DIAGNOSIS — K219 Gastro-esophageal reflux disease without esophagitis: Secondary | ICD-10-CM | POA: Diagnosis present

## 2017-06-24 DIAGNOSIS — I200 Unstable angina: Secondary | ICD-10-CM | POA: Diagnosis not present

## 2017-06-24 DIAGNOSIS — I25709 Atherosclerosis of coronary artery bypass graft(s), unspecified, with unspecified angina pectoris: Secondary | ICD-10-CM | POA: Diagnosis not present

## 2017-06-24 DIAGNOSIS — I2510 Atherosclerotic heart disease of native coronary artery without angina pectoris: Secondary | ICD-10-CM | POA: Diagnosis not present

## 2017-06-24 DIAGNOSIS — I25710 Atherosclerosis of autologous vein coronary artery bypass graft(s) with unstable angina pectoris: Secondary | ICD-10-CM

## 2017-06-24 DIAGNOSIS — Z881 Allergy status to other antibiotic agents status: Secondary | ICD-10-CM | POA: Diagnosis not present

## 2017-06-24 DIAGNOSIS — I25110 Atherosclerotic heart disease of native coronary artery with unstable angina pectoris: Secondary | ICD-10-CM

## 2017-06-24 DIAGNOSIS — E785 Hyperlipidemia, unspecified: Secondary | ICD-10-CM | POA: Diagnosis present

## 2017-06-24 DIAGNOSIS — T82858A Stenosis of vascular prosthetic devices, implants and grafts, initial encounter: Secondary | ICD-10-CM | POA: Diagnosis present

## 2017-06-24 DIAGNOSIS — Z7982 Long term (current) use of aspirin: Secondary | ICD-10-CM | POA: Diagnosis not present

## 2017-06-24 DIAGNOSIS — Z79899 Other long term (current) drug therapy: Secondary | ICD-10-CM | POA: Diagnosis not present

## 2017-06-24 DIAGNOSIS — Z955 Presence of coronary angioplasty implant and graft: Secondary | ICD-10-CM | POA: Diagnosis not present

## 2017-06-24 DIAGNOSIS — I25700 Atherosclerosis of coronary artery bypass graft(s), unspecified, with unstable angina pectoris: Secondary | ICD-10-CM | POA: Diagnosis present

## 2017-06-24 DIAGNOSIS — R748 Abnormal levels of other serum enzymes: Secondary | ICD-10-CM | POA: Diagnosis not present

## 2017-06-24 DIAGNOSIS — Z888 Allergy status to other drugs, medicaments and biological substances status: Secondary | ICD-10-CM | POA: Diagnosis not present

## 2017-06-24 DIAGNOSIS — I214 Non-ST elevation (NSTEMI) myocardial infarction: Secondary | ICD-10-CM | POA: Diagnosis present

## 2017-06-24 DIAGNOSIS — B0229 Other postherpetic nervous system involvement: Secondary | ICD-10-CM | POA: Diagnosis present

## 2017-06-24 DIAGNOSIS — Z6834 Body mass index (BMI) 34.0-34.9, adult: Secondary | ICD-10-CM | POA: Diagnosis not present

## 2017-06-24 DIAGNOSIS — Z8249 Family history of ischemic heart disease and other diseases of the circulatory system: Secondary | ICD-10-CM | POA: Diagnosis not present

## 2017-06-24 HISTORY — PX: LEFT HEART CATH AND CORS/GRAFTS ANGIOGRAPHY: CATH118250

## 2017-06-24 HISTORY — PX: CORONARY STENT INTERVENTION: CATH118234

## 2017-06-24 LAB — BASIC METABOLIC PANEL
Anion gap: 10 (ref 5–15)
BUN: 12 mg/dL (ref 6–20)
CO2: 30 mmol/L (ref 22–32)
CREATININE: 1.06 mg/dL (ref 0.61–1.24)
Calcium: 9.2 mg/dL (ref 8.9–10.3)
Chloride: 99 mmol/L — ABNORMAL LOW (ref 101–111)
GLUCOSE: 100 mg/dL — AB (ref 65–99)
Potassium: 3.8 mmol/L (ref 3.5–5.1)
Sodium: 139 mmol/L (ref 135–145)

## 2017-06-24 LAB — TROPONIN I
TROPONIN I: 0.04 ng/mL — AB (ref <0.03)
TROPONIN I: 0.07 ng/mL — AB (ref <0.03)
TROPONIN I: 0.19 ng/mL — AB (ref <0.03)

## 2017-06-24 LAB — CBC
HCT: 43.8 % (ref 39.0–52.0)
Hemoglobin: 14.1 g/dL (ref 13.0–17.0)
MCH: 27.9 pg (ref 26.0–34.0)
MCHC: 32.2 g/dL (ref 30.0–36.0)
MCV: 86.7 fL (ref 78.0–100.0)
PLATELETS: 154 10*3/uL (ref 150–400)
RBC: 5.05 MIL/uL (ref 4.22–5.81)
RDW: 14.3 % (ref 11.5–15.5)
WBC: 7.2 10*3/uL (ref 4.0–10.5)

## 2017-06-24 LAB — POCT ACTIVATED CLOTTING TIME
Activated Clotting Time: 483 seconds
Activated Clotting Time: 379 seconds

## 2017-06-24 SURGERY — LEFT HEART CATH AND CORS/GRAFTS ANGIOGRAPHY
Anesthesia: LOCAL

## 2017-06-24 MED ORDER — HEPARIN (PORCINE) IN NACL 2-0.9 UNIT/ML-% IJ SOLN
INTRAMUSCULAR | Status: DC | PRN
  Administered 2017-06-24: 16:00:00

## 2017-06-24 MED ORDER — SODIUM CHLORIDE 0.9 % IV SOLN
INTRAVENOUS | Status: DC | PRN
  Administered 2017-06-24 (×2): 1.75 mg/kg/h via INTRAVENOUS

## 2017-06-24 MED ORDER — MIDAZOLAM HCL 2 MG/2ML IJ SOLN
INTRAMUSCULAR | Status: DC | PRN
  Administered 2017-06-24 (×4): 1 mg via INTRAVENOUS

## 2017-06-24 MED ORDER — IOPAMIDOL (ISOVUE-370) INJECTION 76%
INTRAVENOUS | Status: AC
  Filled 2017-06-24: qty 50

## 2017-06-24 MED ORDER — HEPARIN (PORCINE) IN NACL 2-0.9 UNIT/ML-% IJ SOLN
INTRAMUSCULAR | Status: AC | PRN
  Administered 2017-06-24: 1000 mL

## 2017-06-24 MED ORDER — BIVALIRUDIN TRIFLUOROACETATE 250 MG IV SOLR
INTRAVENOUS | Status: AC
  Filled 2017-06-24: qty 250

## 2017-06-24 MED ORDER — HEPARIN (PORCINE) IN NACL 2-0.9 UNIT/ML-% IJ SOLN
INTRAMUSCULAR | Status: AC
  Filled 2017-06-24: qty 1000

## 2017-06-24 MED ORDER — SODIUM CHLORIDE 0.9 % IV SOLN: 250.0000 mL | INTRAVENOUS | Status: DC | PRN

## 2017-06-24 MED ORDER — HEPARIN (PORCINE) IN NACL 2-0.9 UNIT/ML-% IJ SOLN
INTRAMUSCULAR | Status: AC
  Filled 2017-06-24: qty 500

## 2017-06-24 MED ORDER — IOPAMIDOL (ISOVUE-370) INJECTION 76%
INTRAVENOUS | Status: DC | PRN
  Administered 2017-06-24: 275 mL via INTRAVENOUS

## 2017-06-24 MED ORDER — TICAGRELOR 90 MG PO TABS
ORAL_TABLET | ORAL | Status: AC
  Filled 2017-06-24: qty 2

## 2017-06-24 MED ORDER — TICAGRELOR 90 MG PO TABS
90.0000 mg | ORAL_TABLET | Freq: Two times a day (BID) | ORAL | Status: DC
  Administered 2017-06-25 (×2): 90 mg via ORAL
  Filled 2017-06-24 (×2): qty 1

## 2017-06-24 MED ORDER — LIDOCAINE HCL (PF) 1 % IJ SOLN
INTRAMUSCULAR | Status: AC
  Filled 2017-06-24: qty 30

## 2017-06-24 MED ORDER — BIVALIRUDIN BOLUS VIA INFUSION - CUPID
INTRAVENOUS | Status: DC | PRN
  Administered 2017-06-24: 76.8 mg via INTRAVENOUS

## 2017-06-24 MED ORDER — FENTANYL CITRATE (PF) 100 MCG/2ML IJ SOLN
INTRAMUSCULAR | Status: DC | PRN
  Administered 2017-06-24 (×3): 50 ug via INTRAVENOUS

## 2017-06-24 MED ORDER — ACETAMINOPHEN 325 MG PO TABS: 650.0000 mg | ORAL_TABLET | ORAL | Status: DC | PRN

## 2017-06-24 MED ORDER — ASPIRIN 81 MG PO CHEW: 81.0000 mg | CHEWABLE_TABLET | Freq: Every day | ORAL | Status: DC

## 2017-06-24 MED ORDER — LABETALOL HCL 5 MG/ML IV SOLN: 10.0000 mg | INTRAVENOUS | Status: AC | PRN

## 2017-06-24 MED ORDER — FENTANYL CITRATE (PF) 100 MCG/2ML IJ SOLN
INTRAMUSCULAR | Status: AC
  Filled 2017-06-24: qty 2

## 2017-06-24 MED ORDER — SODIUM CHLORIDE 0.9% FLUSH
3.0000 mL | Freq: Two times a day (BID) | INTRAVENOUS | Status: DC
  Administered 2017-06-24: 22:00:00 3 mL via INTRAVENOUS

## 2017-06-24 MED ORDER — HYDRALAZINE HCL 20 MG/ML IJ SOLN: 5.0000 mg | INTRAMUSCULAR | Status: AC | PRN

## 2017-06-24 MED ORDER — ONDANSETRON HCL 4 MG/2ML IJ SOLN: 4.0000 mg | Freq: Four times a day (QID) | INTRAMUSCULAR | Status: DC | PRN

## 2017-06-24 MED ORDER — LIDOCAINE HCL (PF) 1 % IJ SOLN
INTRAMUSCULAR | Status: DC | PRN
  Administered 2017-06-24: 15 mL

## 2017-06-24 MED ORDER — MIDAZOLAM HCL 2 MG/2ML IJ SOLN
INTRAMUSCULAR | Status: AC
  Filled 2017-06-24: qty 2

## 2017-06-24 MED ORDER — IOPAMIDOL (ISOVUE-370) INJECTION 76%
INTRAVENOUS | Status: AC
  Filled 2017-06-24: qty 125

## 2017-06-24 MED ORDER — HEPARIN SODIUM (PORCINE) 5000 UNIT/ML IJ SOLN
5000.0000 [IU] | Freq: Three times a day (TID) | INTRAMUSCULAR | Status: DC
  Administered 2017-06-25: 5000 [IU] via SUBCUTANEOUS
  Filled 2017-06-24: qty 1

## 2017-06-24 MED ORDER — HEART ATTACK BOUNCING BOOK
Freq: Once | Status: AC
  Administered 2017-06-24: 22:00:00
  Filled 2017-06-24: qty 1

## 2017-06-24 MED ORDER — SODIUM CHLORIDE 0.9 % IV SOLN
INTRAVENOUS | Status: AC
  Administered 2017-06-24: 17:00:00 via INTRAVENOUS

## 2017-06-24 MED ORDER — ANGIOPLASTY BOOK
Freq: Once | Status: AC
  Administered 2017-06-24: 22:00:00
  Filled 2017-06-24: qty 1

## 2017-06-24 MED ORDER — SODIUM CHLORIDE 0.9% FLUSH: 3.0000 mL | INTRAVENOUS | Status: DC | PRN

## 2017-06-24 MED ORDER — OXYCODONE-ACETAMINOPHEN 5-325 MG PO TABS: 1.0000 | ORAL_TABLET | ORAL | Status: DC | PRN

## 2017-06-24 MED ORDER — TICAGRELOR 90 MG PO TABS
ORAL_TABLET | ORAL | Status: DC | PRN
  Administered 2017-06-24: 180 mg via ORAL

## 2017-06-24 SURGICAL SUPPLY — 23 items
BALLN SAPPHIRE 3.0X12 (BALLOONS) ×2
BALLN SAPPHIRE 3.5X15 (BALLOONS) ×2
BALLN ~~LOC~~ EUPHORA RX 4.0X6 (BALLOONS) ×2
BALLOON SAPPHIRE 3.0X12 (BALLOONS) IMPLANT
BALLOON SAPPHIRE 3.5X15 (BALLOONS) IMPLANT
BALLOON ~~LOC~~ EUPHORA RX 4.0X6 (BALLOONS) IMPLANT
CATH INFINITI 5 FR IM (CATHETERS) ×1 IMPLANT
CATH INFINITI 5FR MPB2 (CATHETERS) ×1 IMPLANT
CATHETER LAUNCHER 6FR MP1 (CATHETERS) ×1 IMPLANT
DEVICE SPIDERFX EMB PROT 4MM (WIRE) ×1 IMPLANT
DEVICE WIRE ANGIOSEAL 6FR (Vascular Products) ×1 IMPLANT
GUIDEWIRE ANGLED .035X150CM (WIRE) ×1 IMPLANT
KIT ENCORE 26 ADVANTAGE (KITS) ×1 IMPLANT
KIT HEART LEFT (KITS) ×2 IMPLANT
PACK CARDIAC CATHETERIZATION (CUSTOM PROCEDURE TRAY) ×2 IMPLANT
SHEATH PINNACLE 5F 10CM (SHEATH) ×1 IMPLANT
SHEATH PINNACLE 6F 10CM (SHEATH) ×1 IMPLANT
STENT RESOLUTE ONYX 3.5X15 (Permanent Stent) ×1 IMPLANT
STENT RESOLUTE ONYX 4.0X12 (Permanent Stent) ×1 IMPLANT
TRANSDUCER W/STOPCOCK (MISCELLANEOUS) ×2 IMPLANT
TUBING CIL FLEX 10 FLL-RA (TUBING) ×2 IMPLANT
WIRE ASAHI PROWATER 180CM (WIRE) ×1 IMPLANT
WIRE EMERALD 3MM-J .035X150CM (WIRE) ×1 IMPLANT

## 2017-06-24 NOTE — Progress Notes (Signed)
Progress Note  Patient Name: Joshua [name]. Date of Encounter: 06/24/2017  Primary Cardiologist: Ellyn Hack  Subjective   No further chest pain.   Inpatient Medications    Scheduled Meds: . amitriptyline  25 mg Oral QHS  . aspirin EC  81 mg Oral Daily  . atenolol  100 mg Oral Daily   And  . chlorthalidone  25 mg Oral Daily  . citalopram  20 mg Oral Daily  . ezetimibe  10 mg Oral QHS  . gabapentin  800 mg Oral TID  . heparin  5,000 Units Subcutaneous Q8H  . loratadine  10 mg Oral Daily  . pantoprazole  40 mg Oral Daily  . simvastatin  40 mg Oral QHS  . sodium chloride flush  3 mL Intravenous Q12H  . sodium chloride flush  3 mL Intravenous Q12H   Continuous Infusions: . sodium chloride    . sodium chloride    . sodium chloride 1 mL/kg/hr (06/24/17 0615)   PRN Meds: sodium chloride, sodium chloride, acetaminophen, nitroGLYCERIN, ondansetron (ZOFRAN) IV, sodium chloride flush, sodium chloride flush   Vital Signs    Vitals:   06/24/17 0430 06/24/17 0500 06/24/17 0530 06/24/17 0630  BP: (!) 122/91  139/80 (!) 145/77  Pulse: 77  68 64  Resp:      Temp:    98.4 F (36.9 C)  TempSrc:    Oral  SpO2: 92%  95% 95%  Weight:  225 lb 12 oz (102.4 kg)    Height:        Intake/Output Summary (Last 24 hours) at 06/24/17 1101 Last data filed at 06/24/17 0514  Gross per 24 hour  Intake              240 ml  Output                0 ml  Net              240 ml   Filed Weights   06/23/17 1504 06/23/17 2152 06/24/17 0500  Weight: 220 lb (99.8 kg) 225 lb 12.8 oz (102.4 kg) 225 lb 12 oz (102.4 kg)    Telemetry    SR with RBBB - Personally Reviewed  ECG    SR with RBBB - Personally Reviewed  Physical Exam   General: Well developed, well nourished, older W male appearing in no acute distress. Head: Normocephalic, atraumatic.  Neck: Supple without bruits, JVD. Lungs:  Resp regular and unlabored, CTA. Heart: RRR, S1, S2, no S3, S4, or murmur; no rub. Abdomen:  Soft, non-tender, non-distended with normoactive bowel sounds. No hepatomegaly. No rebound/guarding. No obvious abdominal masses. Extremities: No clubbing, cyanosis, edema. Distal pedal pulses are 2+ bilaterally. Neuro: Alert and oriented X 3. Moves all extremities spontaneously. Psych: Normal affect.  Labs    Chemistry Recent Labs Lab 06/23/17 1514 06/24/17 0317  NA 138 139  K 3.7 3.8  CL 104 99*  CO2 26 30  GLUCOSE 99 100*  BUN 14 12  CREATININE 1.04 1.06  CALCIUM 8.9 9.2  GFRNONAA >60 >60  GFRAA >60 >60  ANIONGAP 8 10     Hematology Recent Labs Lab 06/23/17 1514 06/24/17 0317  WBC 7.1 7.2  RBC 4.79 5.05  HGB 13.8 14.1  HCT 41.5 43.8  MCV 86.6 86.7  MCH 28.8 27.9  MCHC 33.3 32.2  RDW 14.3 14.3  PLT 165 154    Cardiac Enzymes Recent Labs Lab 06/23/17 2201 06/24/17 0317 06/24/17 0900  TROPONINI  0.03* 0.04* 0.07*    Recent Labs Lab 06/23/17 1539  TROPIPOC 0.00     BNPNo results for input(s): BNP, PROBNP in the last 168 hours.   DDimer No results for input(s): DDIMER in the last 168 hours.    Radiology    Dg Chest 2 View  Result Date: 06/23/2017 CLINICAL DATA:  3-4 day history of gradually increasing chest pain and shortness of breath with exertion. Prior CABG. EXAM: CHEST  2 VIEW COMPARISON:  09/03/2012, 02/08/2010. FINDINGS: Sternotomy for CABG. Cardiac silhouette mildly enlarged, unchanged. Thoracic aorta mildly tortuous and atherosclerotic, unchanged. Hilar and mediastinal contours otherwise unremarkable. Mildly prominent bronchovascular markings diffusely and mild central peribronchial thickening, more so than on prior examinations. Lungs otherwise clear. No localized airspace consolidation. No pleural effusions. No pneumothorax. Normal pulmonary vascularity. Degenerative changes involving the thoracic spine. IMPRESSION: 1. Mild changes of acute bronchitis and/or asthma. No acute cardiopulmonary disease otherwise. 2. Stable mild cardiomegaly without  pulmonary edema. 3.  Aortic Atherosclerosis (ICD10-170.0) Electronically Signed   By: Evangeline Dakin M.D.   On: 06/23/2017 15:33    Cardiac Studies   N/A  Patient Profile     76 y.o. male with a CAD status post remote CABG, hypertension, hyperlipidemia, GERD, and obesity, who presented to the emergency department with a 3 to four-day history of exertional chest pressure.   Assessment & Plan    1. Unstable angina/coronary artery disease: Patient has a history of remote coronary artery bypass grafting 4 in 1990 with subsequent stenting of the vein graft to the right coronary artery in 2011. He has done well over the past 2 years but began to express dyspnea on exertion earlier this year. Echocardiogram showed normal LV function without wall motion abnormalities. Developed progressive symptoms over the past 3-4 days.  -- ECG is nonacute and troponin peaked at 0.07 -- planned for cardiac cath today -- Continue aspirin (reduce to 81 mg daily), beta blocker, statin, and Zetia.   2. Essential hypertension: controlled with current therapy  3. Hyperlipidemia: LDL was 68 earlier this year.  -- continue statin and Zetia therapy.   4. Morbid obesity: reports inactivity at home with about 15 pound weight gain over a relatively short period of time. He will benefit from outpatient nutritional counseling and more likely cardiac rehabilitation.   5. Post herpetic neuralgia: continue gabapentin   Signed, Reino Bellis, NP  06/24/2017, 11:01 AM   I have seen, examined and evaluated the patient this AM along with Reino Bellis, NP-C .  After reviewing all the available data and chart, we discussed the patients laboratory, study & physical findings as well as symptoms in detail. I agree with her findings, examination as well as impression recommendations as per our discussion.    Signs symptoms of progressive exertional angina. No resting angina. Minimal troponin elevation confirms it is  likely cardiac in nature. He is planned for cardiac catheterization the day. He has not been on IV heparin as he has not had active chest pain at rest. He remains on aspirin, beta blocker, statin and Zetia.  Blood pressure and heart rate are stable on current medications.  He seems like he is gained weight, but this seems probably not related to heart failure as he denies symptoms ofPND or orthopnea.  More plans following cardiac cath   Glenetta Hew, M.D., M.S. Interventional Cardiologist   Pager # [phone] Phone # [phone] [address] [place], [place] [address]

## 2017-06-24 NOTE — H&P (View-Only) (Signed)
Progress Note  Patient Name: Joshua [name]. Date of Encounter: 06/24/2017  Primary Cardiologist: Ellyn Hack  Subjective   No further chest pain.   Inpatient Medications    Scheduled Meds: . amitriptyline  25 mg Oral QHS  . aspirin EC  81 mg Oral Daily  . atenolol  100 mg Oral Daily   And  . chlorthalidone  25 mg Oral Daily  . citalopram  20 mg Oral Daily  . ezetimibe  10 mg Oral QHS  . gabapentin  800 mg Oral TID  . heparin  5,000 Units Subcutaneous Q8H  . loratadine  10 mg Oral Daily  . pantoprazole  40 mg Oral Daily  . simvastatin  40 mg Oral QHS  . sodium chloride flush  3 mL Intravenous Q12H  . sodium chloride flush  3 mL Intravenous Q12H   Continuous Infusions: . sodium chloride    . sodium chloride    . sodium chloride 1 mL/kg/hr (06/24/17 0615)   PRN Meds: sodium chloride, sodium chloride, acetaminophen, nitroGLYCERIN, ondansetron (ZOFRAN) IV, sodium chloride flush, sodium chloride flush   Vital Signs    Vitals:   06/24/17 0430 06/24/17 0500 06/24/17 0530 06/24/17 0630  BP: (!) 122/91  139/80 (!) 145/77  Pulse: 77  68 64  Resp:      Temp:    98.4 F (36.9 C)  TempSrc:    Oral  SpO2: 92%  95% 95%  Weight:  225 lb 12 oz (102.4 kg)    Height:        Intake/Output Summary (Last 24 hours) at 06/24/17 1101 Last data filed at 06/24/17 0514  Gross per 24 hour  Intake              240 ml  Output                0 ml  Net              240 ml   Filed Weights   06/23/17 1504 06/23/17 2152 06/24/17 0500  Weight: 220 lb (99.8 kg) 225 lb 12.8 oz (102.4 kg) 225 lb 12 oz (102.4 kg)    Telemetry    SR with RBBB - Personally Reviewed  ECG    SR with RBBB - Personally Reviewed  Physical Exam   General: Well developed, well nourished, older W male appearing in no acute distress. Head: Normocephalic, atraumatic.  Neck: Supple without bruits, JVD. Lungs:  Resp regular and unlabored, CTA. Heart: RRR, S1, S2, no S3, S4, or murmur; no rub. Abdomen:  Soft, non-tender, non-distended with normoactive bowel sounds. No hepatomegaly. No rebound/guarding. No obvious abdominal masses. Extremities: No clubbing, cyanosis, edema. Distal pedal pulses are 2+ bilaterally. Neuro: Alert and oriented X 3. Moves all extremities spontaneously. Psych: Normal affect.  Labs    Chemistry Recent Labs Lab 06/23/17 1514 06/24/17 0317  NA 138 139  K 3.7 3.8  CL 104 99*  CO2 26 30  GLUCOSE 99 100*  BUN 14 12  CREATININE 1.04 1.06  CALCIUM 8.9 9.2  GFRNONAA >60 >60  GFRAA >60 >60  ANIONGAP 8 10     Hematology Recent Labs Lab 06/23/17 1514 06/24/17 0317  WBC 7.1 7.2  RBC 4.79 5.05  HGB 13.8 14.1  HCT 41.5 43.8  MCV 86.6 86.7  MCH 28.8 27.9  MCHC 33.3 32.2  RDW 14.3 14.3  PLT 165 154    Cardiac Enzymes Recent Labs Lab 06/23/17 2201 06/24/17 0317 06/24/17 0900  TROPONINI  0.03* 0.04* 0.07*    Recent Labs Lab 06/23/17 1539  TROPIPOC 0.00     BNPNo results for input(s): BNP, PROBNP in the last 168 hours.   DDimer No results for input(s): DDIMER in the last 168 hours.    Radiology    Dg Chest 2 View  Result Date: 06/23/2017 CLINICAL DATA:  3-4 day history of gradually increasing chest pain and shortness of breath with exertion. Prior CABG. EXAM: CHEST  2 VIEW COMPARISON:  09/03/2012, 02/08/2010. FINDINGS: Sternotomy for CABG. Cardiac silhouette mildly enlarged, unchanged. Thoracic aorta mildly tortuous and atherosclerotic, unchanged. Hilar and mediastinal contours otherwise unremarkable. Mildly prominent bronchovascular markings diffusely and mild central peribronchial thickening, more so than on prior examinations. Lungs otherwise clear. No localized airspace consolidation. No pleural effusions. No pneumothorax. Normal pulmonary vascularity. Degenerative changes involving the thoracic spine. IMPRESSION: 1. Mild changes of acute bronchitis and/or asthma. No acute cardiopulmonary disease otherwise. 2. Stable mild cardiomegaly without  pulmonary edema. 3.  Aortic Atherosclerosis (ICD10-170.0) Electronically Signed   By: Evangeline Dakin M.D.   On: 06/23/2017 15:33    Cardiac Studies   N/A  Patient Profile     76 y.o. male with a CAD status post remote CABG, hypertension, hyperlipidemia, GERD, and obesity, who presented to the emergency department with a 3 to four-day history of exertional chest pressure.   Assessment & Plan    1. Unstable angina/coronary artery disease: Patient has a history of remote coronary artery bypass grafting 4 in 1990 with subsequent stenting of the vein graft to the right coronary artery in 2011. He has done well over the past 2 years but began to express dyspnea on exertion earlier this year. Echocardiogram showed normal LV function without wall motion abnormalities. Developed progressive symptoms over the past 3-4 days.  -- ECG is nonacute and troponin peaked at 0.07 -- planned for cardiac cath today -- Continue aspirin (reduce to 81 mg daily), beta blocker, statin, and Zetia.   2. Essential hypertension: controlled with current therapy  3. Hyperlipidemia: LDL was 68 earlier this year.  -- continue statin and Zetia therapy.   4. Morbid obesity: reports inactivity at home with about 15 pound weight gain over a relatively short period of time. He will benefit from outpatient nutritional counseling and more likely cardiac rehabilitation.   5. Post herpetic neuralgia: continue gabapentin   Signed, Reino Bellis, NP  06/24/2017, 11:01 AM   I have seen, examined and evaluated the patient this AM along with Reino Bellis, NP-C .  After reviewing all the available data and chart, we discussed the patients laboratory, study & physical findings as well as symptoms in detail. I agree with her findings, examination as well as impression recommendations as per our discussion.    Signs symptoms of progressive exertional angina. No resting angina. Minimal troponin elevation confirms it is  likely cardiac in nature. He is planned for cardiac catheterization the day. He has not been on IV heparin as he has not had active chest pain at rest. He remains on aspirin, beta blocker, statin and Zetia.  Blood pressure and heart rate are stable on current medications.  He seems like he is gained weight, but this seems probably not related to heart failure as he denies symptoms ofPND or orthopnea.  More plans following cardiac cath   Glenetta Hew, M.D., M.S. Interventional Cardiologist   Pager # [phone] Phone # [phone] [address] [place], [place] [address]

## 2017-06-24 NOTE — Interval H&P Note (Signed)
History and Physical Interval Note:  06/24/2017 2:00 PM  Cath Lab Visit (complete for each Cath Lab visit)  Clinical Evaluation Leading to the Procedure:   ACS: Yes.    Non-ACS:    Anginal Classification: CCS IV  Anti-ischemic medical therapy: Maximal Therapy (2 or more classes of medications)  Non-Invasive Test Results: No non-invasive testing performed  Prior CABG: Previous CABG        Joshua [name].  has presented today for surgery, with the diagnosis of unstable angina  The various methods of treatment have been discussed with the patient and family. After consideration of risks, benefits and other options for treatment, the patient has consented to  Procedure(s): Left Heart Cath and Cors/Grafts Angiography (N/A) as a surgical intervention .  The patient's history has been reviewed, patient examined, no change in status, stable for surgery.  I have reviewed the patient's chart and labs.  Questions were answered to the patient's satisfaction.     Joshua [name]

## 2017-06-25 ENCOUNTER — Encounter (HOSPITAL_COMMUNITY): Payer: Self-pay | Admitting: Interventional Cardiology

## 2017-06-25 DIAGNOSIS — Z955 Presence of coronary angioplasty implant and graft: Secondary | ICD-10-CM

## 2017-06-25 DIAGNOSIS — I2510 Atherosclerotic heart disease of native coronary artery without angina pectoris: Secondary | ICD-10-CM | POA: Diagnosis present

## 2017-06-25 DIAGNOSIS — R748 Abnormal levels of other serum enzymes: Secondary | ICD-10-CM

## 2017-06-25 DIAGNOSIS — I252 Old myocardial infarction: Secondary | ICD-10-CM | POA: Diagnosis not present

## 2017-06-25 DIAGNOSIS — I25709 Atherosclerosis of coronary artery bypass graft(s), unspecified, with unspecified angina pectoris: Secondary | ICD-10-CM

## 2017-06-25 LAB — BASIC METABOLIC PANEL
ANION GAP: 11 (ref 5–15)
BUN: 12 mg/dL (ref 6–20)
CALCIUM: 8.5 mg/dL — AB (ref 8.9–10.3)
CO2: 23 mmol/L (ref 22–32)
Chloride: 99 mmol/L — ABNORMAL LOW (ref 101–111)
Creatinine, Ser: 1.03 mg/dL (ref 0.61–1.24)
Glucose, Bld: 123 mg/dL — ABNORMAL HIGH (ref 65–99)
POTASSIUM: 3.7 mmol/L (ref 3.5–5.1)
SODIUM: 133 mmol/L — AB (ref 135–145)

## 2017-06-25 LAB — CBC
HEMATOCRIT: 41.6 % (ref 39.0–52.0)
HEMOGLOBIN: 13.6 g/dL (ref 13.0–17.0)
MCH: 28.6 pg (ref 26.0–34.0)
MCHC: 32.7 g/dL (ref 30.0–36.0)
MCV: 87.6 fL (ref 78.0–100.0)
Platelets: 150 10*3/uL (ref 150–400)
RBC: 4.75 MIL/uL (ref 4.22–5.81)
RDW: 14.7 % (ref 11.5–15.5)
WBC: 7.4 10*3/uL (ref 4.0–10.5)

## 2017-06-25 LAB — TROPONIN I: TROPONIN I: 0.89 ng/mL — AB (ref <0.03)

## 2017-06-25 MED ORDER — ASPIRIN 81 MG PO TBEC: 81.0000 mg | DELAYED_RELEASE_TABLET | Freq: Every day | ORAL | 0 refills | Status: AC

## 2017-06-25 MED ORDER — TICAGRELOR 90 MG PO TABS: 90.0000 mg | ORAL_TABLET | Freq: Two times a day (BID) | ORAL | 10 refills | Status: DC

## 2017-06-25 MED ORDER — TICAGRELOR 90 MG PO TABS: 90.0000 mg | ORAL_TABLET | Freq: Two times a day (BID) | ORAL | 0 refills | Status: DC

## 2017-06-25 MED ORDER — NITROGLYCERIN 0.4 MG SL SUBL: 0.4000 mg | SUBLINGUAL_TABLET | SUBLINGUAL | 2 refills | Status: DC | PRN

## 2017-06-25 NOTE — Progress Notes (Signed)
Pt and wife given all discharge instructions with understanding verbalized. Pt given his hard prescriptions for brilinta and 30 day free brilinta. Pt is not having any chest pain or dizziness at discharge and rt groin site is level 0.  Pt discharged via [facility] home with wife. All belongings with pt.

## 2017-06-25 NOTE — Progress Notes (Signed)
    Brief note:  Stable post Cath - PCI to SVG-RCA.  Ambulated in hall. NO CP or Dyspnea. Groin site stable. Mild Troponin Elevation - not unexpected with ACS presentation & SVG PCI (had some slow reflow).  Will check 1 more Troponin (add on to AML if possible) -- if stable, OK for d/c today.  F/u with Dr. Tamala Julian or APP.   Glenetta Hew, M.D., M.S. Interventional Cardiologist   Pager # [phone] Phone # [phone] [address] [place], [place] [address]

## 2017-06-25 NOTE — Care Management Note (Addendum)
Case Management Note  Patient Details  Name: Joshua [name]. MRN: [id number] Date of Birth: Jun 16, 1941  Subjective/Objective:  From home with wife, presents with chest pressure, s/p  Coronary Stent Intervention, will be on brilinta.  NCM awaiting benefit check.  He will be going to [facility] on [address] to get 30 day free.  They do have brilinta in stock.                 Action/Plan: NCM will follow for dc needs.  Expected Discharge Date:                  Expected Discharge Plan:  Home/Self Care  In-House Referral:     Discharge planning Services  CM Consult  Post Acute Care Choice:    Choice offered to:     DME Arranged:    DME Agency:     HH Arranged:    [place] Agency:     Status of Service:  Completed, signed off  If discussed at [employer] of Stay Meetings, dates discussed:    Additional Comments:  Zenon Mayo, RN 06/25/2017, 7:32 AM

## 2017-06-25 NOTE — Progress Notes (Signed)
CARDIAC REHAB PHASE I   PRE:  Rate/Rhythm: 75 SR  BP:  Supine:   Sitting: 140/71  Standing:    SaO2:   MODE:  Ambulation: 550 ft   POST:  Rate/Rhythm: 87 SR  BP:  Supine:   Sitting: 144/74  Standing:    SaO2: 98%RA 0805-0900 Pt walked 550 ft on RA with steady gait. No CP. Tolerated well. MI education completed with pt and wife who voiced understanding. Stressed importance of brilinta with stent. Reviewed NTG use, risk factors, ex ed, heart healthy food choices and CRP 2. Will refer to [facility] program. Highly recommend program for pt for ed and diet.   Graylon Good, RN BSN  06/25/2017 8:58 AM

## 2017-06-25 NOTE — Discharge Summary (Signed)
Discharge Summary    Patient ID: Joshua Granlund.,  MRN: [id number], DOB/AGE: 1941/06/21 76 y.o.  Admit date: 06/23/2017 Discharge date: 06/25/2017  Primary Care Provider: Dickie La Primary Cardiologist: Tamala Julian   Discharge Diagnoses    Principal Problem:   Unstable angina [facility]) Active Problems:   Hyperlipidemia with target low density lipoprotein (LDL) cholesterol less than 70 mg/dL   HYPERTENSION, BENIGN ESSENTIAL   Coronary artery disease involving coronary bypass graft of native heart with angina pectoris (HCC)   Elevated troponin   3-vessel CAD: Occluded native RCA & LAD, LCx - Graft Dependent   Presence of drug coated stent in SVG-RCA  Allergies Allergies  Allergen Reactions  . Ace Inhibitors Cough  . Atorvastatin Other (See Comments)    Muscle pain  . Rosuvastatin Other (See Comments)    Muscle pain  . Plavix [Clopidogrel Bisulfate] Itching and Rash  . Sulfa Antibiotics Itching and Rash  . Sulfonamide Derivatives Itching and Rash    Diagnostic Studies/Procedures    LHC: 06/24/17  Conclusion    Acute coronary syndrome/non-ST elevation myocardial infarction due to high-grade obstruction in the saphenous vein graft to the right coronary. In-stent restenosis in the proximal/ostial RCA graft with 60-70% narrowing. 99% distal graft obstruction. The graft is heavily calcified and is 74 years old.  Total occlusion of saphenous vein graft to the diagonal  Patent right internal mammary graft to the LAD  Patent left internal mammary graft to the obtuse marginal  Successful 2 site intervention on the SVG to the right coronary with distal protection using 4.0 spider.  Ostial 4.0 x 12 mm Onyx DES reducing 70% stenosis to less than 40%. Distal body 3.5 x 15 Onyx DES reducing 99% stenosis to less than 30%.  Normal LV function with normal filling pressures.  RECOMMENDATIONS:  Brilinta and aspirin for at least a year  Risk factor modification  Potential  discharge in a.m.  Angio-Seal access closure without complications.   Diagnostic Diagram       Post-Intervention Diagram       _____________   History of Present Illness     76 year old male with the past medical history including coronary artery disease status post 4 vessel bypass surgery in 1990, hypertension, hyperlipidemia, GERD, and obesity. He subsequently required bare-metal stenting of the vein graft to the right coronary artery in 2011. He is retired and lives locally with his wife. He is followed closely by Dr. Tamala Julian in the outpatient setting. Earlier this year, he reported dyspnea on exertion and underwent echocardiography, which showed normal LV function. Over the past few months, he has noted stable dyspnea on exertion and has also gained about 15 pounds, which he attributes to a ferocious appetite and inactivity. Beginning about 3-4 days ago, he began to experience exertional substernal chest pressure with any activity throughout the day, lasting just a few minutes, resolving with rest. Because of progressive symptoms, his wife called the office and was advised to present to the emergency department for further evaluation. ECG was nonacute and initial troponin is normal. He was chest pain-free at the time of assessment. Admitted for further work up with plans for cath.   Hospital Course     He was placed on IV heparin, and continued on BB, Zetia, statin and ASA. Trop peaked at 0.19. No further reports of chest pain. Underwent LHC with Dr. Tamala Julian noted above with ISR of the proximal/osital RCA graft with successful PCI/DES to both areas and distal  protection using 4.0 spider. Normal LV function reported. Plan for DAPT with ASA/Brilinta for at least one year. Angio-seal access closure used without complications. Morning labs showed Cr 1.03, and Hgb 13.6. No further chest pain noted. Follow up troponin post cath 0.07>>0.19>>0.89, but determined stable.   He was seen by Dr. Ellyn Hack  and determined stable for discharge home. Follow up in the office has been arranged. Medications are listed below.  _____________  Discharge Vitals Blood pressure 135/69, pulse 73, temperature 97.8 F (36.6 C), temperature source Oral, resp. rate (!) 24, height 5\' 8"  (1.727 m), weight 226 lb 3.1 oz (102.6 kg), SpO2 95 %.  Filed Weights   06/23/17 2152 06/24/17 0500 06/25/17 0645  Weight: 225 lb 12.8 oz (102.4 kg) 225 lb 12 oz (102.4 kg) 226 lb 3.1 oz (102.6 kg)  Physical Exam  Constitutional: He is oriented to person, place, and time. He appears well-developed and well-nourished. No distress.  Obese  HENT:  Head: Normocephalic and atraumatic.  Eyes: Conjunctivae and EOM are normal.  Neck: Normal range of motion. Neck supple. No JVD present.  Cardiovascular: Normal rate, regular rhythm, normal heart sounds and intact distal pulses.  Exam reveals no gallop and no friction rub.   No murmur heard. Pulmonary/Chest: Effort normal. No respiratory distress. He has no wheezes. He has no rales. He exhibits no tenderness.  Abdominal: Soft. Bowel sounds are normal. He exhibits no distension. There is no tenderness. There is no rebound.  Musculoskeletal: Normal range of motion. He exhibits edema.  R Groin cath site - C/C/I - no hematoma. Expected Mild tendernes @ Angioseal site   Neurological: He is alert and oriented to person, place, and time. No cranial nerve deficit.  Skin: Skin is warm and dry. No erythema.  Psychiatric: He has a normal mood and affect. His behavior is normal. Thought content normal.  Nursing note and vitals reviewed.  Labs & Radiologic Studies    CBC  Recent Labs  06/24/17 0317 06/25/17 0406  WBC 7.2 7.4  HGB 14.1 13.6  HCT 43.8 41.6  MCV 86.7 87.6  PLT 154 621   Basic Metabolic Panel  Recent Labs  06/24/17 0317 06/25/17 0406  NA 139 133*  K 3.8 3.7  CL 99* 99*  CO2 30 23  GLUCOSE 100* 123*  BUN 12 12  CREATININE 1.06 1.03  CALCIUM 9.2 8.5*   Liver  Function Tests No results for input(s): AST, ALT, ALKPHOS, BILITOT, PROT, ALBUMIN in the last 72 hours. No results for input(s): LIPASE, AMYLASE in the last 72 hours. Cardiac Enzymes  Recent Labs  06/24/17 0900 06/24/17 1848 06/25/17 0927  TROPONINI 0.07* 0.19* 0.89*   BNP Invalid input(s): POCBNP D-Dimer No results for input(s): DDIMER in the last 72 hours. Hemoglobin A1C No results for input(s): HGBA1C in the last 72 hours. Fasting Lipid Panel No results for input(s): CHOL, HDL, LDLCALC, TRIG, CHOLHDL, LDLDIRECT in the last 72 hours. Thyroid Function Tests No results for input(s): TSH, T4TOTAL, T3FREE, THYROIDAB in the last 72 hours.  Invalid input(s): FREET3 _____________  Dg Chest 2 View  Result Date: 06/23/2017 CLINICAL DATA:  3-4 day history of gradually increasing chest pain and shortness of breath with exertion. Prior CABG. EXAM: CHEST  2 VIEW COMPARISON:  09/03/2012, 02/08/2010. FINDINGS: Sternotomy for CABG. Cardiac silhouette mildly enlarged, unchanged. Thoracic aorta mildly tortuous and atherosclerotic, unchanged. Hilar and mediastinal contours otherwise unremarkable. Mildly prominent bronchovascular markings diffusely and mild central peribronchial thickening, more so than on prior examinations. Lungs  otherwise clear. No localized airspace consolidation. No pleural effusions. No pneumothorax. Normal pulmonary vascularity. Degenerative changes involving the thoracic spine. IMPRESSION: 1. Mild changes of acute bronchitis and/or asthma. No acute cardiopulmonary disease otherwise. 2. Stable mild cardiomegaly without pulmonary edema. 3.  Aortic Atherosclerosis (ICD10-170.0) Electronically Signed   By: Evangeline Dakin M.D.   On: 06/23/2017 15:33   Disposition   Pt is being discharged home today in good condition.  Follow-up Plans & Appointments    Follow-up Information    Burtis Junes, NP Follow up on 06/29/2017.   Specialties:  Nurse Practitioner, Interventional  Cardiology, Cardiology, Radiology Why:  at 8:30am for your follow up appt.  Contact information: [place] [place] [address] [phone]          Discharge Instructions    Amb Referral to Cardiac Rehabilitation    Complete by:  As directed    Diagnosis:   NSTEMI Coronary Stents     Call MD for:  redness, tenderness, or signs of infection (pain, swelling, redness, odor or green/yellow discharge around incision site)    Complete by:  As directed    Diet - low sodium heart healthy    Complete by:  As directed    Discharge instructions    Complete by:  As directed    Groin Site Care Refer to this sheet in the next few weeks. These instructions provide you with information on caring for yourself after your procedure. Your caregiver may also give you more specific instructions. Your treatment has been planned according to current medical practices, but problems sometimes occur. Call your caregiver if you have any problems or questions after your procedure. HOME CARE INSTRUCTIONS You may shower 24 hours after the procedure. Remove the bandage (dressing) and gently wash the site with plain soap and water. Gently pat the site dry.  Do not apply powder or lotion to the site.  Do not sit in a bathtub, swimming pool, or whirlpool for 5 to 7 days.  No bending, squatting, or lifting anything over 10 pounds (4.5 kg) as directed by your caregiver.  Inspect the site at least twice daily.  Do not drive home if you are discharged the same day of the procedure. Have someone else drive you.  You may drive 24 hours after the procedure unless otherwise instructed by your caregiver.  What to expect: Any bruising will usually fade within 1 to 2 weeks.  Blood that collects in the tissue (hematoma) may be painful to the touch. It should usually decrease in size and tenderness within 1 to 2 weeks.  SEEK IMMEDIATE MEDICAL CARE IF: You have unusual pain at the groin site or down the  affected leg.  You have redness, warmth, swelling, or pain at the groin site.  You have drainage (other than a small amount of blood on the dressing).  You have chills.  You have a fever or persistent symptoms for more than 72 hours.  You have a fever and your symptoms suddenly get worse.  Your leg becomes pale, cool, tingly, or numb.  You have heavy bleeding from the site. Hold pressure on the site. Marland Kitchen  PLEASE MAKE SURE THAT YOU DO NOT MISS ANY DOSES OF YOUR BRILINTA!!!!!   Increase activity slowly    Complete by:  As directed       Discharge Medications   Current Discharge Medication List    START taking these medications   Details  aspirin EC 81 MG  EC tablet Take 1 tablet (81 mg total) by mouth daily. Qty: 30 tablet, Refills: 0    nitroGLYCERIN (NITROSTAT) 0.4 MG SL tablet Place 1 tablet (0.4 mg total) under the tongue every 5 (five) minutes x 3 doses as needed for chest pain. Qty: 25 tablet, Refills: 2    ticagrelor (BRILINTA) 90 MG TABS tablet Take 1 tablet (90 mg total) by mouth 2 (two) times daily. Qty: 60 tablet, Refills: 10      CONTINUE these medications which have NOT CHANGED   Details  amitriptyline (ELAVIL) 25 MG tablet TAKE 1 TABLET(25 MG) BY MOUTH AT BEDTIME. Qty: 90 tablet, Refills: 0    atenolol-chlorthalidone (TENORETIC) 100-25 MG tablet Take 1 tablet by mouth daily. Qty: 90 tablet, Refills: 3    cetirizine (ZYRTEC) 10 MG tablet Take 10 mg by mouth daily.    citalopram (CELEXA) 20 MG tablet Take 1 tablet (20 mg total) by mouth daily. Qty: 90 tablet, Refills: 3    esomeprazole (NEXIUM) 20 MG capsule Take 1 capsule (20 mg total) by mouth daily at 12 noon. Qty: 90 capsule, Refills: 3    ezetimibe (ZETIA) 10 MG tablet Take 1 tablet (10 mg total) by mouth daily. Qty: 90 tablet, Refills: 3    gabapentin (NEURONTIN) 100 MG capsule Take 200 mg by mouth 3 (three) times daily. IN CONJUNCTION WITH ONE 600 MG TABLET TO EQUAL A TOTAL DOSE OF 800  MILLIGRAMS Refills: 3    gabapentin (NEURONTIN) 600 MG tablet Take 1 tablet (600 mg total) by mouth 4 (four) times daily. Qty: 120 tablet, Refills: 3    Multiple Vitamins-Minerals (CENTRUM SILVER 50+MEN) TABS Take 1 tablet by mouth daily.    simvastatin (ZOCOR) 40 MG tablet Take 1 tablet (40 mg total) by mouth daily. Qty: 90 tablet, Refills: 3    vitamin B-12 (CYANOCOBALAMIN) 1000 MCG tablet Take 1,000 mcg by mouth daily.      STOP taking these medications     aspirin 325 MG tablet          Aspirin prescribed at discharge?  Yes High Intensity Statin Prescribed? (Lipitor 40-80mg  or Crestor 20-40mg ): No: On Zocor, statin intolerant.  Beta Blocker Prescribed? Yes For EF <40%, was ACEI/ARB Prescribed? No: EF ok ADP Receptor Inhibitor Prescribed? (i.e. Plavix etc.-Includes Medically Managed Patients): Yes For EF <40%, Aldosterone Inhibitor Prescribed? No: EF ok Was EF assessed during THIS hospitalization? Yes Was Cardiac Rehab II ordered? (Included Medically managed Patients): Yes   Outstanding Labs/Studies   N/a  Duration of Discharge Encounter   Greater than 30 minutes including physician time.  Signed, Reino Bellis NP-C 06/25/2017, 11:32 AM

## 2017-06-25 NOTE — Progress Notes (Signed)
2. S/W [facility] [place] # [phone]   1. BRILINTA 90 MG BID   COVER- YES  CO-PAY- $ 45.00  TIER- 3 DRUG  PRIOR APPROVAL- NO   90 DAY SUPPLY AT RETAIL $ 90.00   PREFERRED PHARMACY : [facility] AND  [facility]

## 2017-06-25 NOTE — Consult Note (Signed)
           [facility] CM Primary Care Navigator  06/25/2017  Joshua [name] 08-03-1941 [id number]   Went to seepatient at the bedsideto identify possible discharge needs but he was alreadydischarged.   Patient was discharged home early today per RN report.  Primary care provider's officecalled (Lauren)to notify of patient's discharge and need for post hospital follow-up and transition of care.   Made aware to refer patient to [facility] care management ifdeemed appropriatefor services.    For questions, please contact:  Dannielle Huh, BSN, RN- [facility] Primary Care Navigator  Telephone: [phone] [place]

## 2017-06-25 NOTE — Progress Notes (Signed)
Reino Bellis NP notified of pt's trop increase at 0.89. Awaiting call back.

## 2017-06-26 ENCOUNTER — Telehealth: Payer: Self-pay | Admitting: *Deleted

## 2017-06-26 ENCOUNTER — Telehealth: Payer: Self-pay | Admitting: Family Medicine

## 2017-06-26 ENCOUNTER — Telehealth (HOSPITAL_COMMUNITY): Payer: Self-pay

## 2017-06-26 NOTE — Telephone Encounter (Signed)
Patient insurance is active and benefits verified. Patient insurance is [employer] - $15.00 co-payment, no deductible, out of pocket $3400/$256.19 has been met, no co-insurance, no pre-authorization and no limit. Spoke with Aaleya @ [place] - reference [id number].  Patient will be contacted and scheduled after their follow up appointment with the cardiologist on 06/29/17, upon review by [facility] RN navigator.

## 2017-06-26 NOTE — Telephone Encounter (Signed)
Joshua [name] from [facility] called to inform us that patient was d/c yesterday following MI, heart cath and stent placement. Please refer patient to [facility] if feel patient would benefit from home case management. Hubbard Hartshorn, RN, BSN

## 2017-06-26 NOTE — Telephone Encounter (Signed)
Unsuccessful contact with pt. Unsure if any of the provided numbers under pt's name belong to him. No VM left. - Mesha Guinyard

## 2017-06-29 ENCOUNTER — Ambulatory Visit (INDEPENDENT_AMBULATORY_CARE_PROVIDER_SITE_OTHER): Payer: PPO | Admitting: Nurse Practitioner

## 2017-06-29 ENCOUNTER — Encounter: Payer: Self-pay | Admitting: Nurse Practitioner

## 2017-06-29 VITALS — BP 138/76 | HR 68 | Ht 68.0 in | Wt 224.4 lb

## 2017-06-29 DIAGNOSIS — I10 Essential (primary) hypertension: Secondary | ICD-10-CM

## 2017-06-29 DIAGNOSIS — Z955 Presence of coronary angioplasty implant and graft: Secondary | ICD-10-CM | POA: Diagnosis not present

## 2017-06-29 DIAGNOSIS — I259 Chronic ischemic heart disease, unspecified: Secondary | ICD-10-CM | POA: Diagnosis not present

## 2017-06-29 DIAGNOSIS — I214 Non-ST elevation (NSTEMI) myocardial infarction: Secondary | ICD-10-CM

## 2017-06-29 LAB — BASIC METABOLIC PANEL
BUN/Creatinine Ratio: 14 (ref 10–24)
BUN: 15 mg/dL (ref 8–27)
CO2: 22 mmol/L (ref 20–29)
Calcium: 8.6 mg/dL (ref 8.6–10.2)
Chloride: 94 mmol/L — ABNORMAL LOW (ref 96–106)
Creatinine, Ser: 1.05 mg/dL (ref 0.76–1.27)
GFR calc Af Amer: 81 mL/min/{1.73_m2} (ref >59)
GFR calc non Af Amer: 70 mL/min/{1.73_m2} (ref >59)
Glucose: 144 mg/dL — ABNORMAL HIGH (ref 65–99)
Potassium: 3.6 mmol/L (ref 3.5–5.2)
Sodium: 135 mmol/L (ref 134–144)

## 2017-06-29 LAB — CBC
Hematocrit: 41.2 % (ref 37.5–51.0)
Hemoglobin: 13.9 g/dL (ref 13.0–17.7)
MCH: 29.1 pg (ref 26.6–33.0)
MCHC: 33.7 g/dL (ref 31.5–35.7)
MCV: 86 fL (ref 79–97)
Platelets: 171 10*3/uL (ref 150–379)
RBC: 4.77 x10E6/uL (ref 4.14–5.80)
RDW: 14.4 % (ref 12.3–15.4)
WBC: 7.6 10*3/uL (ref 3.4–10.8)

## 2017-06-29 NOTE — Patient Instructions (Addendum)
We will be checking the following labs today - BMET & CBC   Medication Instructions:    Continue with your current medicines.     Testing/Procedures To Be Arranged:  N/A  Follow-Up:   See me or Dr. Tamala Julian in 3 months.      Other Special Instructions:    I sent a message to Cardiac rehab  Here are my tips to lose weight:  1. Drink only water. You do not need milk, juice, tea, soda or diet soda.  2. Do not eat anything "white". This includes white bread, potatoes, rice or mayo  3. Stay away from fried foods and sweets  4. Your portion should be the size of the palm of your hand.  5. Know what your weaknesses are and avoid.   6. Find an exercise you like and do it every day for 45 to 60 minutes.          If you need a refill on your cardiac medications before your next appointment, please call your pharmacy.   Call the [place] office at [phone] if you have any questions, problems or concerns.

## 2017-06-29 NOTE — Progress Notes (Signed)
CARDIOLOGY OFFICE NOTE  Date:  06/29/2017    Bonny T Michele Mcalpine. Date of Birth: [birth year] Medical Record #[id number]  PCP:  Dickie La, MD  Cardiologist:  Jennings Books    Chief Complaint  Patient presents with  . Coronary Artery Disease    Post hospital visit - seen for Dr. Tamala Julian    History of Present Illness: Javonte T Daaron Dimarco. is a 76 y.o. male who presents today for a post hospital visit. Seen for Dr. Tamala Julian.   He has a long-standing history of essential hypertension, coronary artery disease with remote bypass grafting in 1990 with right internal mammary to LAD, left internal mammary to circumflex, SVG to diagonal, and SVG to RCA; bare-metal stent SVG to RCA 2011, hyperlipidemia, diabetes mellitus, and peripheral neuropathy.  Last seen in March - he was felt to be doing well. Some DOE and had an echo showing normal LV function at that time.   Called here last week with chest pain - referred on to the hospital and was admitted - troponins peaked at 0.19 - got cathed - had ISR of the proximal/osital RCA graft with successful PCI/DES to both areas and distal protection using 4.0 spider by Dr. Tamala Julian. Normal LV function noted. Plan for DAPT with ASA/Brilinta for at least one year.   Comes in today. Here with his wife - [name] - she previously worked at [employer]. Has only been home since Thursday. No real problems. Anxious to get back to "vacuuming" - he really enjoys this activity. No more chest pain. Says his breathing is ok. Apparently allergic to Plavix (? If this was on generic ?filler). But doing ok on Brilinta. BP is ok. No real concerns noted. He understands the importance of needing to exercise and lose weight. No problems with his groin reported.   Past Medical History:  Diagnosis Date  . Anginal pain ([place])   . Coronary artery disease involving native coronary artery of native heart with angina pectoris ([place])    a. 1990 s/p CABG x 4 (RIMA->LAD, LIMA->LCX, VG->Diag,  VG->RCA); b. 2011 s/p BMS to VG->RCA.; c. Unstable Angina 05/2017: Patent RIMA-LAD & LIMA-LCx, ostSVG-RCA 65% ISR & mSVG-RCA  99% - DES PCI to both  . Dyspnea on exertion    a. 01/2017 Echo: EF 55-60%, no rwma, mild MR, midlly dil LA, PASP 23mmHg.  Marland Kitchen GERD (gastroesophageal reflux disease)   . History of hiatal hernia   . Hyperlipidemia   . Hypertension   . Obesity     Past Surgical History:  Procedure Laterality Date  . CATARACT EXTRACTION W/ INTRAOCULAR LENS  IMPLANT, BILATERAL Bilateral   . CORONARY ANGIOPLASTY WITH STENT PLACEMENT  2011   "RCA"  . CORONARY ARTERY BYPASS GRAFT  1990   CABG X4  . CORONARY STENT INTERVENTION N/A 06/24/2017   Procedure: Coronary Stent Intervention;  Surgeon: Belva Crome, MD;  Location: [facility];  Service: Cardiovascular: Ostial 4.0 x 12 mm Onyx DES reducing 70% stenosis to less than 40%. Distal body 3.5 x 15 Onyx DES reducing 99% stenosis to less than 30%.  Marland Kitchen LEFT HEART CATH AND CORS/GRAFTS ANGIOGRAPHY N/A 06/24/2017   Procedure: Left Heart Cath and Cors/Grafts Angiography;  Surgeon: Belva Crome, MD;  Location: [facility];  Service: Cardiovascular:  CTO native RCA, LAD & LCx (Graft Dependent). Patent LIMA-LCx & RIMA-LAD.  SVG-RCA: ost 65% ISR & mid 99% --> DES PCI to both     Medications:  Current Meds  Medication Sig  . amitriptyline (ELAVIL) 25 MG tablet Take 25 mg by mouth at bedtime.  Marland Kitchen aspirin EC 81 MG EC tablet Take 1 tablet (81 mg total) by mouth daily.  Marland Kitchen atenolol-chlorthalidone (TENORETIC) 100-25 MG tablet Take 1 tablet by mouth daily.  . cetirizine (ZYRTEC) 10 MG tablet Take 10 mg by mouth daily.  . citalopram (CELEXA) 20 MG tablet Take 1 tablet (20 mg total) by mouth daily.  Marland Kitchen esomeprazole (NEXIUM) 20 MG capsule Take 20 mg by mouth daily.  Marland Kitchen ezetimibe (ZETIA) 10 MG tablet Take 10 mg by mouth daily.  Marland Kitchen gabapentin (NEURONTIN) 100 MG capsule Take 200 mg by mouth 2 (two) times daily. IN CONJUNCTION WITH ONE 600 MG TABLET TO  EQUAL A TOTAL DOSE OF 800 MILLIGRAMS  . gabapentin (NEURONTIN) 600 MG tablet Take 600 mg by mouth 3 (three) times daily.  . Multiple Vitamins-Minerals (CENTRUM SILVER 50+MEN) TABS Take 1 tablet by mouth daily.  . nitroGLYCERIN (NITROSTAT) 0.4 MG SL tablet Place 1 tablet (0.4 mg total) under the tongue every 5 (five) minutes x 3 doses as needed for chest pain.  . simvastatin (ZOCOR) 40 MG tablet Take 40 mg by mouth daily.  . ticagrelor (BRILINTA) 90 MG TABS tablet Take 1 tablet (90 mg total) by mouth 2 (two) times daily.  . vitamin B-12 (CYANOCOBALAMIN) 1000 MCG tablet Take 1,000 mcg by mouth daily.     Allergies: Allergies  Allergen Reactions  . Ace Inhibitors Cough  . Atorvastatin Other (See Comments)    Muscle pain  . Rosuvastatin Other (See Comments)    Muscle pain  . Plavix [Clopidogrel Bisulfate] Itching and Rash  . Sulfa Antibiotics Itching and Rash  . Sulfonamide Derivatives Itching and Rash    Social History: The patient  reports that he has never smoked. He quit smokeless tobacco use about 18 years ago. His smokeless tobacco use included Chew. He reports that he drinks alcohol. He reports that he does not use drugs.   Family History: The patient's family history includes Cancer in his mother; Coronary artery disease in his father; Heart attack in his father; Hyperlipidemia in his son; Hypertension in his son.   Review of Systems: Please see the history of present illness.   Otherwise, the review of systems is positive for none.   All other systems are reviewed and negative.   Physical Exam: VS:  BP 138/76   Pulse 68   Ht 5\' 8"  (1.727 m)   Wt 224 lb 6.4 oz (101.8 kg)   BMI 34.12 kg/m  .  BMI Body mass index is 34.12 kg/m.  Wt Readings from Last 3 Encounters:  06/29/17 224 lb 6.4 oz (101.8 kg)  06/25/17 226 lb 3.1 oz (102.6 kg)  04/30/17 225 lb (102.1 kg)    General: Pleasant. Obese. Well developed, well nourished and in no acute distress.   HEENT: Normal.    Neck: Supple, no JVD, carotid bruits, or masses noted.  Cardiac: Regular rate and rhythm. No murmurs, rubs, or gallops. No edema.  Respiratory:  Lungs are clear to auscultation bilaterally with normal work of breathing.  GI: Soft and nontender.  MS: No deformity or atrophy. Gait and ROM intact.  Skin: Warm and dry. Color is normal.  Neuro:  Strength and sensation are intact and no gross focal deficits noted.  Psych: Alert, appropriate and with normal affect.   LABORATORY DATA:  EKG:  EKG is ordered today. This demonstrates NSR with RBBB.  Lab  Results  Component Value Date   WBC 7.4 06/25/2017   HGB 13.6 06/25/2017   HCT 41.6 06/25/2017   PLT 150 06/25/2017   GLUCOSE 123 (H) 06/25/2017   CHOL 139 12/14/2014   TRIG 231 (H) 12/14/2014   HDL 25 (L) 12/14/2014   LDLDIRECT 84 05/14/2016   LDLCALC 68 12/14/2014   ALT 42 07/16/2016   AST 54 (H) 07/16/2016   NA 133 (L) 06/25/2017   K 3.7 06/25/2017   CL 99 (L) 06/25/2017   CREATININE 1.03 06/25/2017   BUN 12 06/25/2017   CO2 23 06/25/2017   TSH 3.15 07/16/2016   PSA 1.20 05/14/2016   INR 1.03 06/23/2017     BNP (last 3 results) No results for input(s): BNP in the last 8760 hours.  ProBNP (last 3 results) No results for input(s): PROBNP in the last 8760 hours.   Other Studies Reviewed Today:  LHC: 06/24/17  Conclusion    Acute coronary syndrome/non-ST elevation myocardial infarction due to high-grade obstruction in the saphenous vein graft to the right coronary. In-stent restenosis in the proximal/ostial RCA graft with 60-70% narrowing. 99% distal graft obstruction. The graft is heavily calcified and is 74 years old.  Total occlusion of saphenous vein graft to the diagonal  Patent right internal mammary graft to the LAD  Patent left internal mammary graft to the obtuse marginal  Successful 2 site intervention on the SVG to the right coronary with distal protection using 4.0 spider.  Ostial 4.0 x 12 mm Onyx DES  reducing 70% stenosis to less than 40%. Distal body 3.5 x 15 Onyx DES reducing 99% stenosis to less than 30%.  Normal LV function with normal filling pressures.  RECOMMENDATIONS:  Brilinta and aspirin for at least a year  Risk factor modification  Potential discharge in a.m.  Angio-Seal access closure without complications.    Echo Study Conclusions from 01/2017  - Procedure narrative: Transthoracic echocardiography. Image   quality was suboptimal. The study was technically difficult.   Intravenous contrast (Definity) was administered. - Left ventricle: The cavity size was normal. Systolic function was   normal. The estimated ejection fraction was in the range of 55%   to 60%. Wall motion was normal; there were no regional wall   motion abnormalities. Left ventricular diastolic function   parameters were normal. - Mitral valve: There was mild regurgitation. - Left atrium: The atrium was mildly dilated. - Atrial septum: No defect or patent foramen ovale was identified. - Pulmonary arteries: PA peak pressure: 43 mm Hg (S).   Assessment/Plan:  1. Recent NSTEMI with PCI with successful 2 site intervention on the SVG to the right coronary with distal protection. He is doing well clinically. Recheck baseline lab today. Ok to go to cardiac rehab. CV risk factor modification discussed with both of them at length.   2. Known CAD with remote CABG - see #1  3. HTN - BP ok on current regimen - no changes made today  4. HLD - on statin - also needing lifestyle changes as well. Discussed at length.    Overall plan clinical follow-up in one year. Achieve LDL less than 70. 2-D Doppler echocardiogram to document LV function. If lower extremity claudication develops, will need to have bilateral lower extremity Doppler study performed.  Current medicines are reviewed with the patient today.  The patient does not have concerns regarding medicines other than what has been noted  above.  The following changes have been made:  See above.  Labs/ tests ordered today include:    Orders Placed This Encounter  Procedures  . Basic metabolic panel  . CBC  . EKG 12-Lead     Disposition:   FU with me or Dr. Tamala Julian in 3 months.   Patient is agreeable to this plan and will call if any problems develop in the interim.   SignedTruitt Merle, NP  06/29/2017 8:58 AM  [facility] [address] [place], [place]  [address] Phone: [phone] Fax: [phone]

## 2017-07-05 ENCOUNTER — Telehealth (HOSPITAL_COMMUNITY): Payer: Self-pay | Admitting: Pharmacist

## 2017-07-05 NOTE — Telephone Encounter (Signed)
Cardiac Rehab Medication Review by a Pharmacist  Does the patient  feel that his/her medications are working for him/her?  yes  Has the patient been experiencing any side effects to the medications prescribed?  no  Does the patient measure his/her own blood pressure or blood glucose at home?  no  Per wife, does not check regularly.  Monday BP 136/70  Does the patient have any problems obtaining medications due to transportation or finances?   no  Understanding of regimen: good Understanding of indications: good Potential of compliance: good  Pharmacist comments: Spoke to patient's wife, [name]. She was very good at recalling his medications and dosing. When I asked about BP checks, she said that they were bad at doing that but he had an appointment last week and it was 136/70. She had no other questions or concerns for me.  Nida Boatman, PharmD PGY1 Acute Care Pharmacy Resident Pager: [phone] 07/05/2017 12:47 PM

## 2017-07-08 ENCOUNTER — Encounter: Payer: Self-pay | Admitting: Family Medicine

## 2017-07-08 ENCOUNTER — Ambulatory Visit (INDEPENDENT_AMBULATORY_CARE_PROVIDER_SITE_OTHER): Payer: PPO | Admitting: Family Medicine

## 2017-07-08 DIAGNOSIS — I25709 Atherosclerosis of coronary artery bypass graft(s), unspecified, with unspecified angina pectoris: Secondary | ICD-10-CM

## 2017-07-08 DIAGNOSIS — R251 Tremor, unspecified: Secondary | ICD-10-CM

## 2017-07-08 DIAGNOSIS — B0229 Other postherpetic nervous system involvement: Secondary | ICD-10-CM | POA: Diagnosis not present

## 2017-07-09 ENCOUNTER — Encounter (HOSPITAL_COMMUNITY)
Admission: RE | Admit: 2017-07-09 | Discharge: 2017-07-09 | Disposition: A | Discharge status: Home / Self Care | Payer: PPO | Source: Ambulatory Visit | Attending: Interventional Cardiology | Admitting: Interventional Cardiology

## 2017-07-09 ENCOUNTER — Encounter (HOSPITAL_COMMUNITY): Payer: Self-pay

## 2017-07-09 VITALS — BP 128/80 | HR 62 | Ht 66.75 in | Wt 221.1 lb

## 2017-07-09 DIAGNOSIS — I214 Non-ST elevation (NSTEMI) myocardial infarction: Secondary | ICD-10-CM

## 2017-07-09 DIAGNOSIS — I2510 Atherosclerotic heart disease of native coronary artery without angina pectoris: Secondary | ICD-10-CM | POA: Insufficient documentation

## 2017-07-09 DIAGNOSIS — Z7982 Long term (current) use of aspirin: Secondary | ICD-10-CM | POA: Diagnosis not present

## 2017-07-09 DIAGNOSIS — I10 Essential (primary) hypertension: Secondary | ICD-10-CM | POA: Insufficient documentation

## 2017-07-09 DIAGNOSIS — Z87891 Personal history of nicotine dependence: Secondary | ICD-10-CM | POA: Diagnosis not present

## 2017-07-09 DIAGNOSIS — Z79899 Other long term (current) drug therapy: Secondary | ICD-10-CM | POA: Insufficient documentation

## 2017-07-09 DIAGNOSIS — Z951 Presence of aortocoronary bypass graft: Secondary | ICD-10-CM | POA: Insufficient documentation

## 2017-07-09 DIAGNOSIS — E785 Hyperlipidemia, unspecified: Secondary | ICD-10-CM | POA: Insufficient documentation

## 2017-07-09 DIAGNOSIS — K219 Gastro-esophageal reflux disease without esophagitis: Secondary | ICD-10-CM | POA: Diagnosis not present

## 2017-07-09 DIAGNOSIS — E669 Obesity, unspecified: Secondary | ICD-10-CM | POA: Insufficient documentation

## 2017-07-09 DIAGNOSIS — Z6834 Body mass index (BMI) 34.0-34.9, adult: Secondary | ICD-10-CM | POA: Diagnosis not present

## 2017-07-09 DIAGNOSIS — Z955 Presence of coronary angioplasty implant and graft: Secondary | ICD-10-CM | POA: Diagnosis not present

## 2017-07-09 NOTE — Progress Notes (Signed)
Cardiac Individual Treatment Plan  Patient Details  Name: Joshua [name]. MRN: [id number] Date of Birth: 16-Aug-1941 Referring Provider:     CARDIAC REHAB PHASE II ORIENTATION from 07/09/2017 in [place]  Referring Provider  Daneen Schick MD      Initial Encounter Date:    CARDIAC REHAB PHASE II ORIENTATION from 07/09/2017 in [place]  Date  07/09/17  Referring Provider  Daneen Schick MD      Visit Diagnosis: Status post coronary artery stent placement 06/24/2017  NSTEMI (non-ST elevated myocardial infarction) ([place]) 06/23/2017  Patient's Home Medications on Admission:  Current Outpatient Prescriptions:  .  amitriptyline (ELAVIL) 25 MG tablet, Take 25 mg by mouth at bedtime., Disp: , Rfl:  .  aspirin EC 81 MG EC tablet, Take 1 tablet (81 mg total) by mouth daily., Disp: 30 tablet, Rfl: 0 .  atenolol-chlorthalidone (TENORETIC) 100-25 MG tablet, Take 1 tablet by mouth daily., Disp: 90 tablet, Rfl: 3 .  cetirizine (ZYRTEC) 10 MG tablet, Take 10 mg by mouth daily., Disp: , Rfl:  .  citalopram (CELEXA) 20 MG tablet, Take 1 tablet (20 mg total) by mouth daily., Disp: 90 tablet, Rfl: 3 .  esomeprazole (NEXIUM) 20 MG capsule, Take 20 mg by mouth daily., Disp: , Rfl:  .  ezetimibe (ZETIA) 10 MG tablet, Take 10 mg by mouth daily., Disp: , Rfl:  .  gabapentin (NEURONTIN) 100 MG capsule, Take 200 mg by mouth 2 (two) times daily. IN CONJUNCTION WITH ONE 600 MG TABLET TO EQUAL A TOTAL DOSE OF 800 MILLIGRAMS, Disp: , Rfl: 3 .  gabapentin (NEURONTIN) 600 MG tablet, Take 600 mg by mouth 3 (three) times daily., Disp: , Rfl:  .  Multiple Vitamins-Minerals (CENTRUM SILVER 50+MEN) TABS, Take 1 tablet by mouth daily., Disp: , Rfl:  .  nitroGLYCERIN (NITROSTAT) 0.4 MG SL tablet, Place 1 tablet (0.4 mg total) under the tongue every 5 (five) minutes x 3 doses as needed for chest pain., Disp: 25 tablet, Rfl: 2 .  simvastatin (ZOCOR) 40 MG  tablet, Take 40 mg by mouth daily., Disp: , Rfl:  .  ticagrelor (BRILINTA) 90 MG TABS tablet, Take 1 tablet (90 mg total) by mouth 2 (two) times daily., Disp: 60 tablet, Rfl: 10 .  vitamin B-12 (CYANOCOBALAMIN) 1000 MCG tablet, Take 1,000 mcg by mouth daily., Disp: , Rfl:   Past Medical History: Past Medical History:  Diagnosis Date  . Anginal pain ([place])   . Coronary artery disease involving native coronary artery of native heart with angina pectoris ([place])    a. 1990 s/p CABG x 4 (RIMA->LAD, LIMA->LCX, VG->Diag, VG->RCA); b. 2011 s/p BMS to VG->RCA.; c. Unstable Angina 05/2017: Patent RIMA-LAD & LIMA-LCx, ostSVG-RCA 65% ISR & mSVG-RCA  99% - DES PCI to both  . Dyspnea on exertion    a. 01/2017 Echo: EF 55-60%, no rwma, mild MR, midlly dil LA, PASP 50mmHg.  [place] Kitchen GERD (gastroesophageal reflux disease)   . History of hiatal hernia   . Hyperlipidemia   . Hypertension   . Obesity     Tobacco Use: History  Smoking Status  . Never Smoker  Smokeless Tobacco  . Former Systems developer  . Types: Chew  . Quit date: 2000    Labs: Recent Review Flowsheet Data    Labs for ITP Cardiac and Pulmonary Rehab Latest Ref Rng & Units 02/15/2010 09/17/2011 06/02/2012 12/14/2014 05/14/2016   Cholestrol 0 - 200 mg/dL - 147 141 139 -  LDLCALC 0 - 99 mg/dL - 90 93 68 -   LDLDIRECT <130 mg/dL - - - - 84   HDL >39 mg/dL - 27(L) 29(L) 25(L) -   Trlycerides <150 mg/dL - 151(H) 96 231(H) -   TCO2 0 - 100 mmol/L 26 - - - -      Capillary Blood Glucose: No results found for: GLUCAP   Exercise Target Goals: Date: 07/09/17  Exercise Program Goal: Individual exercise prescription set with THRR, safety & activity barriers. Participant demonstrates ability to understand and report RPE using BORG scale, to self-measure pulse accurately, and to acknowledge the importance of the exercise prescription.  Exercise Prescription Goal: Starting with aerobic activity 30 plus minutes a day, 3 days per week for initial exercise  prescription. Provide home exercise prescription and guidelines that participant acknowledges understanding prior to discharge.  Activity Barriers & Risk Stratification:     Activity Barriers & Cardiac Risk Stratification - 07/09/17 1608      Activity Barriers & Cardiac Risk Stratification   Activity Barriers Back Problems;Balance Concerns   Cardiac Risk Stratification High  history of CABG 1990      6 Minute Walk:     6 Minute Walk    Row Name 07/09/17 1348         6 Minute Walk   Phase Initial     Distance 1311 feet     Walk Time 6 minutes     # of Rest Breaks 0     MPH 2.5     METS 2.3     RPE 9     VO2 Peak 8.1     Symptoms No     Resting HR 62 bpm     Resting BP 128/80     Max Ex. HR 84 bpm     Max Ex. BP 154/80     2 Minute Post BP 130/77        Oxygen Initial Assessment:   Oxygen Re-Evaluation:   Oxygen Discharge (Final Oxygen Re-Evaluation):   Initial Exercise Prescription:     Initial Exercise Prescription - 07/09/17 1300      Date of Initial Exercise RX and Referring Provider   Date 07/09/17   Referring Provider Daneen Schick MD     Bike   Level 0.7   Minutes 10   METs 2.5     NuStep   Level 2   SPM 85   Minutes 10   METs 2     Track   Laps 8   Minutes 10   METs 2.4     Prescription Details   Frequency (times per week) 3   Duration Progress to 45 minutes of aerobic exercise without signs/symptoms of physical distress     Intensity   THRR 40-80% of Max Heartrate 59-118   Ratings of Perceived Exertion 11-13   Perceived Dyspnea 0-4     Progression   Progression Continue to progress workloads to maintain intensity without signs/symptoms of physical distress.     Resistance Training   Training Prescription Yes   Weight 2   Reps 10-15      Perform Capillary Blood Glucose checks as needed.  Exercise Prescription Changes:   Exercise Comments:   Exercise Goals and Review:     Exercise Goals    Row Name 07/09/17  0948             Exercise Goals   Increase Physical Activity Yes       Intervention  Provide advice, education, support and counseling about physical activity/exercise needs.;Develop an individualized exercise prescription for aerobic and resistive training based on initial evaluation findings, risk stratification, comorbidities and participant's personal goals.       Expected Outcomes Achievement of increased cardiorespiratory fitness and enhanced flexibility, muscular endurance and strength shown through measurements of functional capacity and personal statement of participant.       Increase Strength and Stamina Yes       Intervention Provide advice, education, support and counseling about physical activity/exercise needs.;Develop an individualized exercise prescription for aerobic and resistive training based on initial evaluation findings, risk stratification, comorbidities and participant's personal goals.       Expected Outcomes Achievement of increased cardiorespiratory fitness and enhanced flexibility, muscular endurance and strength shown through measurements of functional capacity and personal statement of participant.          Exercise Goals Re-Evaluation :    Discharge Exercise Prescription (Final Exercise Prescription Changes):   Nutrition:  Target Goals: Understanding of nutrition guidelines, daily intake of sodium 1500mg , cholesterol 200mg , calories 30% from fat and 7% or less from saturated fats, daily to have 5 or more servings of fruits and vegetables.  Biometrics:     Pre Biometrics - 07/09/17 1610      Pre Biometrics   Height 5' 6.75" (1.695 m)   Weight 221 lb 1.9 oz (100.3 kg)   Waist Circumference 46.25 inches   Hip Circumference 45.5 inches   Waist to Hip Ratio 1.02 %   BMI (Calculated) 35   Triceps Skinfold 10 mm   % Body Fat 31.9 %   Grip Strength 34 kg   Flexibility 9 in   Single Leg Stand 13.2 seconds       Nutrition Therapy Plan and  Nutrition Goals:   Nutrition Discharge: Nutrition Scores:   Nutrition Goals Re-Evaluation:   Nutrition Goals Re-Evaluation:   Nutrition Goals Discharge (Final Nutrition Goals Re-Evaluation):   Psychosocial: Target Goals: Acknowledge presence or absence of significant depression and/or stress, maximize coping skills, provide positive support system. Participant is able to verbalize types and ability to use techniques and skills needed for reducing stress and depression.  Initial Review & Psychosocial Screening:     Initial Psych Review & Screening - 07/09/17 1606      Initial Review   Current issues with None Identified     Family Dynamics   Good Support System? Yes     Barriers   Psychosocial barriers to participate in program There are no identifiable barriers or psychosocial needs.     Screening Interventions   Interventions Encouraged to exercise      Quality of Life Scores:     Quality of Life - 07/09/17 1407      Quality of Life Scores   Health/Function Pre 24.23 %   Socioeconomic Pre 25.36 %   Psych/Spiritual Pre 28.07 %   Family Pre 28.8 %   GLOBAL Pre 25.93 %      PHQ-9: Recent Review Flowsheet Data    Depression screen [facility] 2/9 07/08/2017 02/18/2017 01/28/2017 05/14/2016 03/21/2015   Decreased Interest 0 0 0 0 0   Down, Depressed, Hopeless 0 0 0 0 0   PHQ - 2 Score 0 0 0 0 0     Interpretation of Total Score  Total Score Depression Severity:  1-4 = Minimal depression, 5-9 = Mild depression, 10-14 = Moderate depression, 15-19 = Moderately severe depression, 20-27 = Severe depression   Psychosocial Evaluation and Intervention:  Psychosocial Re-Evaluation:   Psychosocial Discharge (Final Psychosocial Re-Evaluation):   Vocational Rehabilitation: Provide vocational rehab assistance to qualifying candidates.   Vocational Rehab Evaluation & Intervention:     Vocational Rehab - 07/09/17 1604      Initial Vocational Rehab Evaluation &  Intervention   Assessment shows need for Vocational Rehabilitation No  Mr Morrell does not need vocational rehab at this time      Education: Education Goals: Education classes will be provided on a weekly basis, covering required topics. Participant will state understanding/return demonstration of topics presented.  Learning Barriers/Preferences:     Learning Barriers/Preferences - 07/09/17 0946      Learning Barriers/Preferences   Learning Barriers None   Learning Preferences Written Material;Skilled Demonstration      Education Topics: Count Your Pulse:  -Group instruction provided by verbal instruction, demonstration, patient participation and written materials to support subject.  Instructors address importance of being able to find your pulse and how to count your pulse when at home without a heart monitor.  Patients get hands on experience counting their pulse with staff help and individually.   Heart Attack, Angina, and Risk Factor Modification:  -Group instruction provided by verbal instruction, video, and written materials to support subject.  Instructors address signs and symptoms of angina and heart attacks.    Also discuss risk factors for heart disease and how to make changes to improve heart health risk factors.   Functional Fitness:  -Group instruction provided by verbal instruction, demonstration, patient participation, and written materials to support subject.  Instructors address safety measures for doing things around the house.  Discuss how to get up and down off the floor, how to pick things up properly, how to safely get out of a chair without assistance, and balance training.   Meditation and Mindfulness:  -Group instruction provided by verbal instruction, patient participation, and written materials to support subject.  Instructor addresses importance of mindfulness and meditation practice to help reduce stress and improve awareness.  Instructor also leads  participants through a meditation exercise.    Stretching for Flexibility and Mobility:  -Group instruction provided by verbal instruction, patient participation, and written materials to support subject.  Instructors lead participants through series of stretches that are designed to increase flexibility thus improving mobility.  These stretches are additional exercise for major muscle groups that are typically performed during regular warm up and cool down.   Hands Only CPR:  -Group verbal, video, and participation provides a basic overview of AHA guidelines for community CPR. Role-play of emergencies allow participants the opportunity to practice calling for help and chest compression technique with discussion of AED use.   Hypertension: -Group verbal and written instruction that provides a basic overview of hypertension including the most recent diagnostic guidelines, risk factor reduction with self-care instructions and medication management.    Nutrition I class: Heart Healthy Eating:  -Group instruction provided by PowerPoint slides, verbal discussion, and written materials to support subject matter. The instructor gives an explanation and review of the Therapeutic Lifestyle Changes diet recommendations, which includes a discussion on lipid goals, dietary fat, sodium, fiber, plant stanol/sterol esters, sugar, and the components of a well-balanced, healthy diet.   Nutrition II class: Lifestyle Skills:  -Group instruction provided by PowerPoint slides, verbal discussion, and written materials to support subject matter. The instructor gives an explanation and review of label reading, grocery shopping for heart health, heart healthy recipe modifications, and ways to make healthier choices when eating out.  Diabetes Question & Answer:  -Group instruction provided by PowerPoint slides, verbal discussion, and written materials to support subject matter. The instructor gives an explanation  and review of diabetes co-morbidities, pre- and post-prandial blood glucose goals, pre-exercise blood glucose goals, signs, symptoms, and treatment of hypoglycemia and hyperglycemia, and foot care basics.   Diabetes Blitz:  -Group instruction provided by PowerPoint slides, verbal discussion, and written materials to support subject matter. The instructor gives an explanation and review of the physiology behind type 1 and type 2 diabetes, diabetes medications and rational behind using different medications, pre- and post-prandial blood glucose recommendations and Hemoglobin A1c goals, diabetes diet, and exercise including blood glucose guidelines for exercising safely.    Portion Distortion:  -Group instruction provided by PowerPoint slides, verbal discussion, written materials, and food models to support subject matter. The instructor gives an explanation of serving size versus portion size, changes in portions sizes over the last 20 years, and what consists of a serving from each food group.   Stress Management:  -Group instruction provided by verbal instruction, video, and written materials to support subject matter.  Instructors review role of stress in heart disease and how to cope with stress positively.     Exercising on Your Own:  -Group instruction provided by verbal instruction, power point, and written materials to support subject.  Instructors discuss benefits of exercise, components of exercise, frequency and intensity of exercise, and end points for exercise.  Also discuss use of nitroglycerin and activating EMS.  Review options of places to exercise outside of rehab.  Review guidelines for sex with heart disease.   Cardiac Drugs I:  -Group instruction provided by verbal instruction and written materials to support subject.  Instructor reviews cardiac drug classes: antiplatelets, anticoagulants, beta blockers, and statins.  Instructor discusses reasons, side effects, and lifestyle  considerations for each drug class.   Cardiac Drugs II:  -Group instruction provided by verbal instruction and written materials to support subject.  Instructor reviews cardiac drug classes: angiotensin converting enzyme inhibitors (ACE-I), angiotensin II receptor blockers (ARBs), nitrates, and calcium channel blockers.  Instructor discusses reasons, side effects, and lifestyle considerations for each drug class.   Anatomy and Physiology of the Circulatory System:  Group verbal and written instruction and models provide basic cardiac anatomy and physiology, with the coronary electrical and arterial systems. Review of: AMI, Angina, Valve disease, Heart Failure, Peripheral Artery Disease, Cardiac Arrhythmia, Pacemakers, and the ICD.   Other Education:  -Group or individual verbal, written, or video instructions that support the educational goals of the cardiac rehab program.   Knowledge Questionnaire Score:     Knowledge Questionnaire Score - 07/09/17 1406      Knowledge Questionnaire Score   Pre Score 24/24      Core Components/Risk Factors/Patient Goals at Admission:     Personal Goals and Risk Factors at Admission - 07/09/17 1407      Core Components/Risk Factors/Patient Goals on Admission    Weight Management Yes;Obesity   Intervention Weight Management: Develop a combined nutrition and exercise program designed to reach desired caloric intake, while maintaining appropriate intake of nutrient and fiber, sodium and fats, and appropriate energy expenditure required for the weight goal.   Expected Outcomes Short Term: Continue to assess and modify interventions until short term weight is achieved   Hypertension Yes   Intervention Provide education on lifestyle modifcations including regular physical activity/exercise, weight management, moderate sodium restriction and increased consumption of fresh fruit, vegetables, and low fat dairy,  alcohol moderation, and smoking  cessation.;Monitor prescription use compliance.   Expected Outcomes Long Term: Maintenance of blood pressure at goal levels.;Short Term: Continued assessment and intervention until BP is < 140/64mm HG in hypertensive participants. < 130/51mm HG in hypertensive participants with diabetes, heart failure or chronic kidney disease.   Lipids Yes   Intervention Provide education and support for participant on nutrition & aerobic/resistive exercise along with prescribed medications to achieve LDL 70mg , HDL >40mg .   Expected Outcomes Short Term: Participant states understanding of desired cholesterol values and is compliant with medications prescribed. Participant is following exercise prescription and nutrition guidelines.;Long Term: Cholesterol controlled with medications as prescribed, with individualized exercise RX and with personalized nutrition plan. Value goals: LDL < 70mg , HDL > 40 mg.      Core Components/Risk Factors/Patient Goals Review:    Core Components/Risk Factors/Patient Goals at Discharge (Final Review):    ITP Comments:     ITP Comments    Row Name 07/09/17 0947           ITP Comments Dr. Fransico Him, Medical Director          Comments: Jory attended orientation from 0800 to 1000 to review rules and guidelines for program. Completed 6 minute walk test, Intitial ITP, and exercise prescription.  VSS. Telemetry-Sinus Rhythm, Bundle Branch Block this has been previously documented.  Asymptomatic.Barnet Pall, RN,BSN 07/09/2017 4:24 PM

## 2017-07-10 NOTE — Assessment & Plan Note (Signed)
I agree with the gradual taper down of gabapentin. We discussed the new shingle shot. He would like to wait currently and I agree with that plan. Because back for follow-up we will address again.

## 2017-07-10 NOTE — Assessment & Plan Note (Signed)
He continues to have mild intentional tongue tremor. I read through the report neurologists note and in seated they had a specific diagnosis and they did not indicate any follow-up needed.

## 2017-07-10 NOTE — Assessment & Plan Note (Signed)
He seems to be doing well. We'll continue this medication regimen. I lichen to follow-up more regularly and to be more honest about his symptoms in the future.

## 2017-07-10 NOTE — Progress Notes (Signed)
    CHIEF COMPLAINT / HPI:  #1. Cardiovascular disease: Had episode of chest pain and went to the emergency department. Was found to be having STEMI and subsequent only headache cardiac catheterization and stent placement. Since then he's doing much better. He start him on some new medicines. He's not had any recurrence of his chest pain. His energy level is fine. He is back to his normal activities. No problems with his new medicines. Reveals that he actually been having chest pain for 3 or 4 days before he ended up telling his wife. #2. Tongue tremor: He went to the neurologist and had workup but said they didn't really come up with any answer and they did not have him follow-up. He thinks it's a little better but his wife thinks it's about the same. #3. Post herpetic neuralgia from shingles of the right face and scalp area: He still has pain. He got up to 900 mg of gabapentin 3 or 4 times a day at one point and he is started taper back down does he thinks it was making him feel very sleepy in the afternoon and a little unsteady. They're tapering slowly dropping 100 mg a week or so. Right now he is tolerating to drop in dosage well. He has questions about the new shingles shot.  REVIEW OF SYSTEMS:  No unusual weight change. No fever. See history of present illness above for additional pertinent review of systems.  OBJECTIVE:  Vital signs are reviewed.  Vital signs reviewed. GENERAL: Well-developed, well-nourished, no acute distress. CARDIOVASCULAR: Regular rate and rhythm no murmur gallop or rub LUNGS: Clear to auscultation bilaterally, no rales or wheeze. ABDOMEN: Soft positive bowel sounds NEURO: No gross focal neurological deficits except decreased hearing and tongue tremor if he extends his tongue. MSK: Movement of extremity x 4.    ASSESSMENT / PLAN: Please see problem oriented charting for details

## 2017-07-13 ENCOUNTER — Encounter (HOSPITAL_COMMUNITY): Payer: PPO

## 2017-07-13 ENCOUNTER — Encounter (HOSPITAL_COMMUNITY)
Admission: RE | Admit: 2017-07-13 | Discharge: 2017-07-13 | Disposition: A | Discharge status: Home / Self Care | Payer: PPO | Source: Ambulatory Visit | Attending: Interventional Cardiology | Admitting: Interventional Cardiology

## 2017-07-13 DIAGNOSIS — I214 Non-ST elevation (NSTEMI) myocardial infarction: Secondary | ICD-10-CM

## 2017-07-13 DIAGNOSIS — Z955 Presence of coronary angioplasty implant and graft: Secondary | ICD-10-CM

## 2017-07-13 NOTE — Progress Notes (Signed)
Daily Session Note  Patient Details  Name: Joshua [name]. MRN: [id number] Date of Birth: 1940/12/11 Referring Provider:     CARDIAC REHAB PHASE II ORIENTATION from 07/09/2017 in [place]  Referring Provider  Daneen Schick MD      Encounter Date: 07/13/2017  Check In:     Session Check In - 07/13/17 1047      Check-In   Location [facility]   Staff Present Luetta Nutting Fair, MS, ACSM RCEP, Exercise Physiologist;Ashley Armstrong, MS, Exercise Physiologist;Angad Nabers, RN, [facility] diVincenzo, MS, ACSM RCEP, Exercise Physiologist   Supervising physician immediately available to respond to emergencies [facility] immediately available   Physician(s) Dr. Clementeen Graham   Medication changes reported     No   Fall or balance concerns reported    No   Tobacco Cessation No Change   Warm-up and Cool-down Performed as group-led instruction   Resistance Training Performed Yes   VAD Patient? No     Pain Assessment   Currently in Pain? No/denies   Multiple Pain Sites No      Capillary Blood Glucose: No results found for this or any previous visit (from the past 24 hour(s)).    History  Smoking Status  . Never Smoker  Smokeless Tobacco  . Former Systems developer  . Types: Chew  . Quit date: 2000    Goals Met:  Exercise tolerated well  Goals Unmet:  Not Applicable  Comments: Pt started cardiac rehab today.  Pt tolerated light exercise without difficulty. VSS, telemetry-Sinus Rhythm with a bundle branch block., asymptomatic.  Medication list reconciled. Pt denies barriers to medicaiton compliance.  PSYCHOSOCIAL ASSESSMENT:  PHQ-0. Pt exhibits positive coping skills, hopeful outlook with supportive family. No psychosocial needs identified at this time, no psychosocial interventions necessary.    Pt enjoys watching TV.   Pt oriented to exercise equipment and routine.    Understanding verbalized.Barnet Pall, RN,BSN 07/13/2017 5:14 PM   Dr.  Fransico Him is Medical Director for Cardiac Rehab at [facility].

## 2017-07-15 ENCOUNTER — Encounter (HOSPITAL_COMMUNITY)
Admission: RE | Admit: 2017-07-15 | Discharge: 2017-07-15 | Disposition: A | Discharge status: Home / Self Care | Payer: PPO | Source: Ambulatory Visit | Attending: Interventional Cardiology | Admitting: Interventional Cardiology

## 2017-07-15 ENCOUNTER — Encounter (HOSPITAL_COMMUNITY): Payer: PPO

## 2017-07-15 DIAGNOSIS — I214 Non-ST elevation (NSTEMI) myocardial infarction: Secondary | ICD-10-CM

## 2017-07-15 DIAGNOSIS — Z955 Presence of coronary angioplasty implant and graft: Secondary | ICD-10-CM

## 2017-07-15 NOTE — Progress Notes (Signed)
Cardiac Individual Treatment Plan  Patient Details  Name: Joshua [name]. MRN: [id number] Date of Birth: 04/20/1941 Referring Provider:     CARDIAC REHAB PHASE II ORIENTATION from 07/09/2017 in [place]  Referring Provider  Daneen Schick MD      Initial Encounter Date:    CARDIAC REHAB PHASE II ORIENTATION from 07/09/2017 in [place]  Date  07/09/17  Referring Provider  Daneen Schick MD      Visit Diagnosis: Status post coronary artery stent placement 06/24/2017  NSTEMI (non-ST elevated myocardial infarction) ([place]) 06/23/2017  Patient's Home Medications on Admission:  Current Outpatient Prescriptions:  .  amitriptyline (ELAVIL) 25 MG tablet, Take 25 mg by mouth at bedtime., Disp: , Rfl:  .  aspirin EC 81 MG EC tablet, Take 1 tablet (81 mg total) by mouth daily., Disp: 30 tablet, Rfl: 0 .  atenolol-chlorthalidone (TENORETIC) 100-25 MG tablet, Take 1 tablet by mouth daily., Disp: 90 tablet, Rfl: 3 .  cetirizine (ZYRTEC) 10 MG tablet, Take 10 mg by mouth daily., Disp: , Rfl:  .  citalopram (CELEXA) 20 MG tablet, Take 1 tablet (20 mg total) by mouth daily., Disp: 90 tablet, Rfl: 3 .  esomeprazole (NEXIUM) 20 MG capsule, Take 20 mg by mouth daily., Disp: , Rfl:  .  ezetimibe (ZETIA) 10 MG tablet, Take 10 mg by mouth daily., Disp: , Rfl:  .  gabapentin (NEURONTIN) 100 MG capsule, Take 200 mg by mouth 2 (two) times daily. IN CONJUNCTION WITH ONE 600 MG TABLET TO EQUAL A TOTAL DOSE OF 800 MILLIGRAMS, Disp: , Rfl: 3 .  Multiple Vitamins-Minerals (CENTRUM SILVER 50+MEN) TABS, Take 1 tablet by mouth daily., Disp: , Rfl:  .  nitroGLYCERIN (NITROSTAT) 0.4 MG SL tablet, Place 1 tablet (0.4 mg total) under the tongue every 5 (five) minutes x 3 doses as needed for chest pain., Disp: 25 tablet, Rfl: 2 .  simvastatin (ZOCOR) 40 MG tablet, Take 40 mg by mouth daily., Disp: , Rfl:  .  ticagrelor (BRILINTA) 90 MG TABS tablet, Take 1  tablet (90 mg total) by mouth 2 (two) times daily., Disp: 60 tablet, Rfl: 10 .  vitamin B-12 (CYANOCOBALAMIN) 1000 MCG tablet, Take 1,000 mcg by mouth daily., Disp: , Rfl:  .  gabapentin (NEURONTIN) 600 MG tablet, Take 600 mg by mouth 3 (three) times daily., Disp: , Rfl:   Past Medical History: Past Medical History:  Diagnosis Date  . Anginal pain ([place])   . Coronary artery disease involving native coronary artery of native heart with angina pectoris ([place])    a. 1990 s/p CABG x 4 (RIMA->LAD, LIMA->LCX, VG->Diag, VG->RCA); b. 2011 s/p BMS to VG->RCA.; c. Unstable Angina 05/2017: Patent RIMA-LAD & LIMA-LCx, ostSVG-RCA 65% ISR & mSVG-RCA  99% - DES PCI to both  . Dyspnea on exertion    a. 01/2017 Echo: EF 55-60%, no rwma, mild MR, midlly dil LA, PASP 41mmHg.  Marland Kitchen GERD (gastroesophageal reflux disease)   . History of hiatal hernia   . Hyperlipidemia   . Hypertension   . Obesity     Tobacco Use: History  Smoking Status  . Never Smoker  Smokeless Tobacco  . Former Systems developer  . Types: Chew  . Quit date: 2000    Labs: Recent Review Flowsheet Data    Labs for ITP Cardiac and Pulmonary Rehab Latest Ref Rng & Units 02/15/2010 09/17/2011 06/02/2012 12/14/2014 05/14/2016   Cholestrol 0 - 200 mg/dL - 147 141 139 -  LDLCALC 0 - 99 mg/dL - 90 93 68 -   LDLDIRECT <130 mg/dL - - - - 84   HDL >39 mg/dL - 27(L) 29(L) 25(L) -   Trlycerides <150 mg/dL - 151(H) 96 231(H) -   TCO2 0 - 100 mmol/L 26 - - - -      Capillary Blood Glucose: No results found for: GLUCAP   Exercise Target Goals:    Exercise Program Goal: Individual exercise prescription set with THRR, safety & activity barriers. Participant demonstrates ability to understand and report RPE using BORG scale, to self-measure pulse accurately, and to acknowledge the importance of the exercise prescription.  Exercise Prescription Goal: Starting with aerobic activity 30 plus minutes a day, 3 days per week for initial exercise prescription. Provide  home exercise prescription and guidelines that participant acknowledges understanding prior to discharge.  Activity Barriers & Risk Stratification:     Activity Barriers & Cardiac Risk Stratification - 07/09/17 1608      Activity Barriers & Cardiac Risk Stratification   Activity Barriers Back Problems;Balance Concerns   Cardiac Risk Stratification High  history of CABG 1990      6 Minute Walk:     6 Minute Walk    Row Name 07/09/17 1348         6 Minute Walk   Phase Initial     Distance 1311 feet     Walk Time 6 minutes     # of Rest Breaks 0     MPH 2.5     METS 2.3     RPE 9     VO2 Peak 8.1     Symptoms No     Resting HR 62 bpm     Resting BP 128/80     Max Ex. HR 84 bpm     Max Ex. BP 154/80     2 Minute Post BP 130/77        Oxygen Initial Assessment:   Oxygen Re-Evaluation:   Oxygen Discharge (Final Oxygen Re-Evaluation):   Initial Exercise Prescription:     Initial Exercise Prescription - 07/09/17 1300      Date of Initial Exercise RX and Referring Provider   Date 07/09/17   Referring Provider Daneen Schick MD     Bike   Level 0.7   Minutes 10   METs 2.5     NuStep   Level 2   SPM 85   Minutes 10   METs 2     Track   Laps 8   Minutes 10   METs 2.4     Prescription Details   Frequency (times per week) 3   Duration Progress to 45 minutes of aerobic exercise without signs/symptoms of physical distress     Intensity   THRR 40-80% of Max Heartrate 59-118   Ratings of Perceived Exertion 11-13   Perceived Dyspnea 0-4     Progression   Progression Continue to progress workloads to maintain intensity without signs/symptoms of physical distress.     Resistance Training   Training Prescription Yes   Weight 2   Reps 10-15      Perform Capillary Blood Glucose checks as needed.  Exercise Prescription Changes:   Exercise Comments:   Exercise Goals and Review:     Exercise Goals    Row Name 07/09/17 0948              Exercise Goals   Increase Physical Activity Yes       Intervention  Provide advice, education, support and counseling about physical activity/exercise needs.;Develop an individualized exercise prescription for aerobic and resistive training based on initial evaluation findings, risk stratification, comorbidities and participant's personal goals.       Expected Outcomes Achievement of increased cardiorespiratory fitness and enhanced flexibility, muscular endurance and strength shown through measurements of functional capacity and personal statement of participant.       Increase Strength and Stamina Yes       Intervention Provide advice, education, support and counseling about physical activity/exercise needs.;Develop an individualized exercise prescription for aerobic and resistive training based on initial evaluation findings, risk stratification, comorbidities and participant's personal goals.       Expected Outcomes Achievement of increased cardiorespiratory fitness and enhanced flexibility, muscular endurance and strength shown through measurements of functional capacity and personal statement of participant.          Exercise Goals Re-Evaluation :    Discharge Exercise Prescription (Final Exercise Prescription Changes):   Nutrition:  Target Goals: Understanding of nutrition guidelines, daily intake of sodium 1500mg , cholesterol 200mg , calories 30% from fat and 7% or less from saturated fats, daily to have 5 or more servings of fruits and vegetables.  Biometrics:     Pre Biometrics - 07/09/17 1610      Pre Biometrics   Height 5' 6.75" (1.695 m)   Weight 221 lb 1.9 oz (100.3 kg)   Waist Circumference 46.25 inches   Hip Circumference 45.5 inches   Waist to Hip Ratio 1.02 %   BMI (Calculated) 35   Triceps Skinfold 10 mm   % Body Fat 31.9 %   Grip Strength 34 kg   Flexibility 9 in   Single Leg Stand 13.2 seconds       Nutrition Therapy Plan and Nutrition Goals:      Nutrition Therapy & Goals - 07/15/17 1022      Nutrition Therapy   Diet Therapeutic Lifestyle Changes     Personal Nutrition Goals   Nutrition Goal Pt to identify and limit food sources of saturated fat, trans fat, and sodium   Personal Goal #2 Pt to identify food quantities necessary to achieve weight loss of 6-24 lb (2.7-10.9 kg) at graduation from cardiac rehab.      Intervention Plan   Intervention Prescribe, educate and counsel regarding individualized specific dietary modifications aiming towards targeted core components such as weight, hypertension, lipid management, diabetes, heart failure and other comorbidities.   Expected Outcomes Short Term Goal: Understand basic principles of dietary content, such as calories, fat, sodium, cholesterol and nutrients.;Long Term Goal: Adherence to prescribed nutrition plan.      Nutrition Discharge: Nutrition Scores:     Nutrition Assessments - 07/15/17 1022      MEDFICTS Scores   Pre Score 67      Nutrition Goals Re-Evaluation:   Nutrition Goals Re-Evaluation:   Nutrition Goals Discharge (Final Nutrition Goals Re-Evaluation):   Psychosocial: Target Goals: Acknowledge presence or absence of significant depression and/or stress, maximize coping skills, provide positive support system. Participant is able to verbalize types and ability to use techniques and skills needed for reducing stress and depression.  Initial Review & Psychosocial Screening:     Initial Psych Review & Screening - 07/09/17 1606      Initial Review   Current issues with None Identified     Family Dynamics   Good Support System? Yes     Barriers   Psychosocial barriers to participate in program There are no identifiable barriers or  psychosocial needs.     Screening Interventions   Interventions Encouraged to exercise      Quality of Life Scores:     Quality of Life - 07/09/17 1407      Quality of Life Scores   Health/Function Pre 24.23 %    Socioeconomic Pre 25.36 %   Psych/Spiritual Pre 28.07 %   Family Pre 28.8 %   GLOBAL Pre 25.93 %      PHQ-9: Recent Review Flowsheet Data    Depression screen [place] 2/9 07/13/2017 07/08/2017 02/18/2017 01/28/2017 05/14/2016   Decreased Interest 0 0 0 0 0   Down, Depressed, Hopeless 0 0 0 0 0   PHQ - 2 Score 0 0 0 0 0     Interpretation of Total Score  Total Score Depression Severity:  1-4 = Minimal depression, 5-9 = Mild depression, 10-14 = Moderate depression, 15-19 = Moderately severe depression, 20-27 = Severe depression   Psychosocial Evaluation and Intervention:   Psychosocial Re-Evaluation:   Psychosocial Discharge (Final Psychosocial Re-Evaluation):   Vocational Rehabilitation: Provide vocational rehab assistance to qualifying candidates.   Vocational Rehab Evaluation & Intervention:     Vocational Rehab - 07/09/17 1604      Initial Vocational Rehab Evaluation & Intervention   Assessment shows need for Vocational Rehabilitation No  Mr Matousek does not need vocational rehab at this time      Education: Education Goals: Education classes will be provided on a weekly basis, covering required topics. Participant will state understanding/return demonstration of topics presented.  Learning Barriers/Preferences:     Learning Barriers/Preferences - 07/09/17 0946      Learning Barriers/Preferences   Learning Barriers None   Learning Preferences Written Material;Skilled Demonstration      Education Topics: Count Your Pulse:  -Group instruction provided by verbal instruction, demonstration, patient participation and written materials to support subject.  Instructors address importance of being able to find your pulse and how to count your pulse when at home without a heart monitor.  Patients get hands on experience counting their pulse with staff help and individually.   Heart Attack, Angina, and Risk Factor Modification:  -Group instruction provided by verbal  instruction, video, and written materials to support subject.  Instructors address signs and symptoms of angina and heart attacks.    Also discuss risk factors for heart disease and how to make changes to improve heart health risk factors.   Functional Fitness:  -Group instruction provided by verbal instruction, demonstration, patient participation, and written materials to support subject.  Instructors address safety measures for doing things around the house.  Discuss how to get up and down off the floor, how to pick things up properly, how to safely get out of a chair without assistance, and balance training.   Meditation and Mindfulness:  -Group instruction provided by verbal instruction, patient participation, and written materials to support subject.  Instructor addresses importance of mindfulness and meditation practice to help reduce stress and improve awareness.  Instructor also leads participants through a meditation exercise.    Stretching for Flexibility and Mobility:  -Group instruction provided by verbal instruction, patient participation, and written materials to support subject.  Instructors lead participants through series of stretches that are designed to increase flexibility thus improving mobility.  These stretches are additional exercise for major muscle groups that are typically performed during regular warm up and cool down.   Hands Only CPR:  -Group verbal, video, and participation provides a basic overview of AHA guidelines for  community CPR. Role-play of emergencies allow participants the opportunity to practice calling for help and chest compression technique with discussion of AED use.   Hypertension: -Group verbal and written instruction that provides a basic overview of hypertension including the most recent diagnostic guidelines, risk factor reduction with self-care instructions and medication management.    Nutrition I class: Heart Healthy Eating:  -Group  instruction provided by PowerPoint slides, verbal discussion, and written materials to support subject matter. The instructor gives an explanation and review of the Therapeutic Lifestyle Changes diet recommendations, which includes a discussion on lipid goals, dietary fat, sodium, fiber, plant stanol/sterol esters, sugar, and the components of a well-balanced, healthy diet.   Nutrition II class: Lifestyle Skills:  -Group instruction provided by PowerPoint slides, verbal discussion, and written materials to support subject matter. The instructor gives an explanation and review of label reading, grocery shopping for heart health, heart healthy recipe modifications, and ways to make healthier choices when eating out.   Diabetes Question & Answer:  -Group instruction provided by PowerPoint slides, verbal discussion, and written materials to support subject matter. The instructor gives an explanation and review of diabetes co-morbidities, pre- and post-prandial blood glucose goals, pre-exercise blood glucose goals, signs, symptoms, and treatment of hypoglycemia and hyperglycemia, and foot care basics.   Diabetes Blitz:  -Group instruction provided by PowerPoint slides, verbal discussion, and written materials to support subject matter. The instructor gives an explanation and review of the physiology behind type 1 and type 2 diabetes, diabetes medications and rational behind using different medications, pre- and post-prandial blood glucose recommendations and Hemoglobin A1c goals, diabetes diet, and exercise including blood glucose guidelines for exercising safely.    Portion Distortion:  -Group instruction provided by PowerPoint slides, verbal discussion, written materials, and food models to support subject matter. The instructor gives an explanation of serving size versus portion size, changes in portions sizes over the last 20 years, and what consists of a serving from each food group.   Stress  Management:  -Group instruction provided by verbal instruction, video, and written materials to support subject matter.  Instructors review role of stress in heart disease and how to cope with stress positively.     Exercising on Your Own:  -Group instruction provided by verbal instruction, power point, and written materials to support subject.  Instructors discuss benefits of exercise, components of exercise, frequency and intensity of exercise, and end points for exercise.  Also discuss use of nitroglycerin and activating EMS.  Review options of places to exercise outside of rehab.  Review guidelines for sex with heart disease.   Cardiac Drugs I:  -Group instruction provided by verbal instruction and written materials to support subject.  Instructor reviews cardiac drug classes: antiplatelets, anticoagulants, beta blockers, and statins.  Instructor discusses reasons, side effects, and lifestyle considerations for each drug class.   Cardiac Drugs II:  -Group instruction provided by verbal instruction and written materials to support subject.  Instructor reviews cardiac drug classes: angiotensin converting enzyme inhibitors (ACE-I), angiotensin II receptor blockers (ARBs), nitrates, and calcium channel blockers.  Instructor discusses reasons, side effects, and lifestyle considerations for each drug class.   Anatomy and Physiology of the Circulatory System:  Group verbal and written instruction and models provide basic cardiac anatomy and physiology, with the coronary electrical and arterial systems. Review of: AMI, Angina, Valve disease, Heart Failure, Peripheral Artery Disease, Cardiac Arrhythmia, Pacemakers, and the ICD.   Other Education:  -Group or individual verbal, written, or video instructions  that support the educational goals of the cardiac rehab program.   Knowledge Questionnaire Score:     Knowledge Questionnaire Score - 07/09/17 1406      Knowledge Questionnaire Score   Pre  Score 24/24      Core Components/Risk Factors/Patient Goals at Admission:     Personal Goals and Risk Factors at Admission - 07/09/17 1407      Core Components/Risk Factors/Patient Goals on Admission    Weight Management Yes;Obesity   Intervention Weight Management: Develop a combined nutrition and exercise program designed to reach desired caloric intake, while maintaining appropriate intake of nutrient and fiber, sodium and fats, and appropriate energy expenditure required for the weight goal.   Expected Outcomes Short Term: Continue to assess and modify interventions until short term weight is achieved   Hypertension Yes   Intervention Provide education on lifestyle modifcations including regular physical activity/exercise, weight management, moderate sodium restriction and increased consumption of fresh fruit, vegetables, and low fat dairy, alcohol moderation, and smoking cessation.;Monitor prescription use compliance.   Expected Outcomes Long Term: Maintenance of blood pressure at goal levels.;Short Term: Continued assessment and intervention until BP is < 140/37mm HG in hypertensive participants. < 130/39mm HG in hypertensive participants with diabetes, heart failure or chronic kidney disease.   Lipids Yes   Intervention Provide education and support for participant on nutrition & aerobic/resistive exercise along with prescribed medications to achieve LDL 70mg , HDL >40mg .   Expected Outcomes Short Term: Participant states understanding of desired cholesterol values and is compliant with medications prescribed. Participant is following exercise prescription and nutrition guidelines.;Long Term: Cholesterol controlled with medications as prescribed, with individualized exercise RX and with personalized nutrition plan. Value goals: LDL < 70mg , HDL > 40 mg.      Core Components/Risk Factors/Patient Goals Review:    Core Components/Risk Factors/Patient Goals at Discharge (Final Review):     ITP Comments:     ITP Comments    Row Name 07/09/17 0947           ITP Comments Dr. Fransico Him, Medical Director          Comments: Pt is making expected progress toward personal goals after completing 3 sessions. Recommend continued exercise and life style modification education including  stress management and relaxation techniques to decrease cardiac risk profile. Skip is off to a good start with exercise.Barnet Pall, RN,BSN 07/15/2017 5:27 PM

## 2017-07-15 NOTE — Progress Notes (Signed)
Angeles Paolucci. 76 y.o. male DOB [birth year] MRN [id number]       Nutrition Note  NSTEMI, DESx2 SVG RCA  Past Medical History:  Diagnosis Date  . Anginal pain ([place])   . Coronary artery disease involving native coronary artery of native heart with angina pectoris ([place])    a. 1990 s/p CABG x 4 (RIMA->LAD, LIMA->LCX, VG->Diag, VG->RCA); b. 2011 s/p BMS to VG->RCA.; c. Unstable Angina 05/2017: Patent RIMA-LAD & LIMA-LCx, ostSVG-RCA 65% ISR & mSVG-RCA  99% - DES PCI to both  . Dyspnea on exertion    a. 01/2017 Echo: EF 55-60%, no rwma, mild MR, midlly dil LA, PASP 59mmHg.  Marland Kitchen GERD (gastroesophageal reflux disease)   . History of hiatal hernia   . Hyperlipidemia   . Hypertension   . Obesity    Meds reviewed.   HT: Ht Readings from Last 1 Encounters:  07/09/17 5' 6.75" (1.695 m)    WT: Wt Readings from Last 3 Encounters:  07/09/17 221 lb 1.9 oz (100.3 kg)  07/08/17 221 lb (100.2 kg)  06/29/17 224 lb 6.4 oz (101.8 kg)     BMI 35   Current tobacco use? No  Labs:  No results found for: HGBA1C CBG (last 3)  No results for input(s): GLUCAP in the last 72 hours.  Nutrition Diagnosis ? Food-and nutrition-related knowledge deficit related to lack of exposure to information as related to diagnosis of: ? CVD ? Obesity related to excessive energy intake as evidenced by a BMI of 35  Nutrition Goal(s):  ? Pt to identify and limit food sources of saturated fat, trans fat, and sodium ? Pt to identify food quantities necessary to achieve weight loss of 6-24 lb (2.7-10.9 kg) at graduation from cardiac rehab.  Plan:  Pt to attend nutrition classes ? Nutrition I ? Nutrition II ? Portion Distortion  Will provide client-centered nutrition education as part of interdisciplinary care.   Monitor and evaluate progress toward nutrition goal with team.  Derek Mound, M.Ed, RD, LDN, CDE 07/15/2017 10:17 AM

## 2017-07-17 ENCOUNTER — Encounter (HOSPITAL_COMMUNITY): Payer: PPO

## 2017-07-17 ENCOUNTER — Encounter (HOSPITAL_COMMUNITY)
Admission: RE | Admit: 2017-07-17 | Discharge: 2017-07-17 | Disposition: A | Discharge status: Home / Self Care | Payer: PPO | Source: Ambulatory Visit | Attending: Interventional Cardiology | Admitting: Interventional Cardiology

## 2017-07-17 DIAGNOSIS — I214 Non-ST elevation (NSTEMI) myocardial infarction: Secondary | ICD-10-CM

## 2017-07-17 DIAGNOSIS — Z955 Presence of coronary angioplasty implant and graft: Secondary | ICD-10-CM

## 2017-07-20 ENCOUNTER — Encounter (HOSPITAL_COMMUNITY)
Admission: RE | Admit: 2017-07-20 | Discharge: 2017-07-20 | Disposition: A | Discharge status: Home / Self Care | Payer: PPO | Source: Ambulatory Visit | Attending: Interventional Cardiology | Admitting: Interventional Cardiology

## 2017-07-20 ENCOUNTER — Encounter (HOSPITAL_COMMUNITY): Payer: PPO

## 2017-07-20 DIAGNOSIS — I214 Non-ST elevation (NSTEMI) myocardial infarction: Secondary | ICD-10-CM

## 2017-07-20 DIAGNOSIS — Z955 Presence of coronary angioplasty implant and graft: Secondary | ICD-10-CM

## 2017-07-22 ENCOUNTER — Encounter (HOSPITAL_COMMUNITY): Payer: PPO

## 2017-07-22 ENCOUNTER — Encounter (HOSPITAL_COMMUNITY)
Admission: RE | Admit: 2017-07-22 | Discharge: 2017-07-22 | Disposition: A | Discharge status: Home / Self Care | Payer: PPO | Source: Ambulatory Visit | Attending: Interventional Cardiology | Admitting: Interventional Cardiology

## 2017-07-22 DIAGNOSIS — I214 Non-ST elevation (NSTEMI) myocardial infarction: Secondary | ICD-10-CM

## 2017-07-22 DIAGNOSIS — Z955 Presence of coronary angioplasty implant and graft: Secondary | ICD-10-CM

## 2017-07-24 ENCOUNTER — Encounter (HOSPITAL_COMMUNITY): Payer: PPO

## 2017-07-24 ENCOUNTER — Encounter (HOSPITAL_COMMUNITY)
Admission: RE | Admit: 2017-07-24 | Discharge: 2017-07-24 | Disposition: A | Discharge status: Home / Self Care | Payer: PPO | Source: Ambulatory Visit | Attending: Interventional Cardiology | Admitting: Interventional Cardiology

## 2017-07-24 DIAGNOSIS — I214 Non-ST elevation (NSTEMI) myocardial infarction: Secondary | ICD-10-CM

## 2017-07-24 DIAGNOSIS — Z955 Presence of coronary angioplasty implant and graft: Secondary | ICD-10-CM

## 2017-07-27 ENCOUNTER — Encounter (HOSPITAL_COMMUNITY): Payer: PPO

## 2017-07-27 ENCOUNTER — Encounter (HOSPITAL_COMMUNITY)
Admission: RE | Admit: 2017-07-27 | Discharge: 2017-07-27 | Disposition: A | Discharge status: Home / Self Care | Payer: PPO | Source: Ambulatory Visit | Attending: Interventional Cardiology | Admitting: Interventional Cardiology

## 2017-07-27 DIAGNOSIS — I214 Non-ST elevation (NSTEMI) myocardial infarction: Secondary | ICD-10-CM

## 2017-07-27 DIAGNOSIS — Z955 Presence of coronary angioplasty implant and graft: Secondary | ICD-10-CM

## 2017-07-29 ENCOUNTER — Encounter (HOSPITAL_COMMUNITY): Payer: PPO

## 2017-07-29 ENCOUNTER — Encounter (HOSPITAL_COMMUNITY)
Admission: RE | Admit: 2017-07-29 | Discharge: 2017-07-29 | Disposition: A | Discharge status: Home / Self Care | Payer: PPO | Source: Ambulatory Visit | Attending: Interventional Cardiology | Admitting: Interventional Cardiology

## 2017-07-29 DIAGNOSIS — Z955 Presence of coronary angioplasty implant and graft: Secondary | ICD-10-CM

## 2017-07-29 DIAGNOSIS — I214 Non-ST elevation (NSTEMI) myocardial infarction: Secondary | ICD-10-CM

## 2017-07-31 ENCOUNTER — Encounter (HOSPITAL_COMMUNITY)
Admission: RE | Admit: 2017-07-31 | Discharge: 2017-07-31 | Disposition: A | Discharge status: Home / Self Care | Payer: PPO | Source: Ambulatory Visit | Attending: Interventional Cardiology | Admitting: Interventional Cardiology

## 2017-07-31 ENCOUNTER — Encounter (HOSPITAL_COMMUNITY): Payer: PPO

## 2017-07-31 DIAGNOSIS — I214 Non-ST elevation (NSTEMI) myocardial infarction: Secondary | ICD-10-CM | POA: Diagnosis not present

## 2017-07-31 DIAGNOSIS — Z955 Presence of coronary angioplasty implant and graft: Secondary | ICD-10-CM

## 2017-08-05 ENCOUNTER — Encounter (HOSPITAL_COMMUNITY): Payer: PPO

## 2017-08-05 ENCOUNTER — Encounter (HOSPITAL_COMMUNITY)
Admission: RE | Admit: 2017-08-05 | Discharge: 2017-08-05 | Disposition: A | Discharge status: Home / Self Care | Payer: PPO | Source: Ambulatory Visit | Attending: Interventional Cardiology | Admitting: Interventional Cardiology

## 2017-08-05 DIAGNOSIS — Z6834 Body mass index (BMI) 34.0-34.9, adult: Secondary | ICD-10-CM | POA: Insufficient documentation

## 2017-08-05 DIAGNOSIS — I2510 Atherosclerotic heart disease of native coronary artery without angina pectoris: Secondary | ICD-10-CM | POA: Diagnosis not present

## 2017-08-05 DIAGNOSIS — Z79899 Other long term (current) drug therapy: Secondary | ICD-10-CM | POA: Diagnosis not present

## 2017-08-05 DIAGNOSIS — Z87891 Personal history of nicotine dependence: Secondary | ICD-10-CM | POA: Diagnosis not present

## 2017-08-05 DIAGNOSIS — Z955 Presence of coronary angioplasty implant and graft: Secondary | ICD-10-CM | POA: Diagnosis not present

## 2017-08-05 DIAGNOSIS — E669 Obesity, unspecified: Secondary | ICD-10-CM | POA: Insufficient documentation

## 2017-08-05 DIAGNOSIS — E785 Hyperlipidemia, unspecified: Secondary | ICD-10-CM | POA: Diagnosis not present

## 2017-08-05 DIAGNOSIS — I214 Non-ST elevation (NSTEMI) myocardial infarction: Secondary | ICD-10-CM | POA: Insufficient documentation

## 2017-08-05 DIAGNOSIS — Z7982 Long term (current) use of aspirin: Secondary | ICD-10-CM | POA: Diagnosis not present

## 2017-08-05 DIAGNOSIS — I10 Essential (primary) hypertension: Secondary | ICD-10-CM | POA: Diagnosis not present

## 2017-08-05 DIAGNOSIS — Z951 Presence of aortocoronary bypass graft: Secondary | ICD-10-CM | POA: Diagnosis not present

## 2017-08-05 DIAGNOSIS — K219 Gastro-esophageal reflux disease without esophagitis: Secondary | ICD-10-CM | POA: Diagnosis not present

## 2017-08-05 NOTE — Progress Notes (Signed)
Reviewed home exercise guidelines with patient including endpoints, temperature precautions, target heart rate and rate of perceived exertion. Pt is riding his stationary bike 30 minutes 2-3 days per week as his mode of home exercise. Pt voices understanding of instructions given. Sol Passer, MS, ACSM CEP

## 2017-08-07 ENCOUNTER — Encounter (HOSPITAL_COMMUNITY)
Admission: RE | Admit: 2017-08-07 | Discharge: 2017-08-07 | Disposition: A | Discharge status: Home / Self Care | Payer: PPO | Source: Ambulatory Visit | Attending: Interventional Cardiology | Admitting: Interventional Cardiology

## 2017-08-07 ENCOUNTER — Encounter (HOSPITAL_COMMUNITY): Payer: PPO

## 2017-08-07 DIAGNOSIS — I214 Non-ST elevation (NSTEMI) myocardial infarction: Secondary | ICD-10-CM | POA: Diagnosis not present

## 2017-08-07 DIAGNOSIS — Z955 Presence of coronary angioplasty implant and graft: Secondary | ICD-10-CM

## 2017-08-07 NOTE — Progress Notes (Signed)
Joshua [name]. 76 y.o. male DOB [birth year] MRN [id number]       Nutrition Note  NSTEMI, DESx2 SVG RCA HT: Ht Readings from Last 1 Encounters:  07/09/17 5' 6.75" (1.695 m)    WT: Wt Readings from Last 3 Encounters:  07/09/17 221 lb 1.9 oz (100.3 kg)  07/08/17 221 lb (100.2 kg)  06/29/17 224 lb 6.4 oz (101.8 kg)     BMI 35  Note Spoke with pt. Nutrition plan and survey reviewed with pt. Pt is following Step 1 of the Therapeutic Lifestyle Changes diet. Pt wants to lose wt. Pt wt today is 98.1 kg, which is down 2.2 kg since admission. Wt loss tips reviewed. Pt expressed understanding of the information reviewed. Pt aware of nutrition education classes offered and plans on attending nutrition classes.  Nutrition Diagnosis ? Food-and nutrition-related knowledge deficit related to lack of exposure to information as related to diagnosis of: ? CVD ? Obesity related to excessive energy intake as evidenced by a BMI of 35  Nutrition Intervention ? Pt's individual nutrition plan and goals reviewed with pt.  Nutrition Goal(s):  ? Pt to identify and limit food sources of saturated fat, trans fat, and sodium ? Pt to identify food quantities necessary to achieve weight loss of 6-24 lb (2.7-10.9 kg) at graduation from cardiac rehab.  Plan:  Pt to attend nutrition classes ? Nutrition I - met 08/04/17 ? Nutrition II ? Portion Distortion  Will provide client-centered nutrition education as part of interdisciplinary care.   Monitor and evaluate progress toward nutrition goal with team.  Derek Mound, M.Ed, RD, LDN, CDE 08/07/2017 10:42 AM

## 2017-08-10 ENCOUNTER — Encounter (HOSPITAL_COMMUNITY)
Admission: RE | Admit: 2017-08-10 | Discharge: 2017-08-10 | Disposition: A | Discharge status: Home / Self Care | Payer: PPO | Source: Ambulatory Visit | Attending: Interventional Cardiology | Admitting: Interventional Cardiology

## 2017-08-10 ENCOUNTER — Encounter (HOSPITAL_COMMUNITY): Payer: PPO

## 2017-08-10 DIAGNOSIS — Z955 Presence of coronary angioplasty implant and graft: Secondary | ICD-10-CM

## 2017-08-10 DIAGNOSIS — I214 Non-ST elevation (NSTEMI) myocardial infarction: Secondary | ICD-10-CM | POA: Diagnosis not present

## 2017-08-12 ENCOUNTER — Encounter (HOSPITAL_COMMUNITY): Payer: PPO

## 2017-08-12 ENCOUNTER — Encounter (HOSPITAL_COMMUNITY)
Admission: RE | Admit: 2017-08-12 | Discharge: 2017-08-12 | Disposition: A | Discharge status: Home / Self Care | Payer: PPO | Source: Ambulatory Visit | Attending: Interventional Cardiology | Admitting: Interventional Cardiology

## 2017-08-12 DIAGNOSIS — I214 Non-ST elevation (NSTEMI) myocardial infarction: Secondary | ICD-10-CM

## 2017-08-12 DIAGNOSIS — Z955 Presence of coronary angioplasty implant and graft: Secondary | ICD-10-CM

## 2017-08-12 NOTE — Progress Notes (Signed)
Cardiac Individual Treatment Plan  Patient Details  [name]: Joshua [name]. MRN: [id number] Date of Birth: 16-Aug-1941 Referring Provider:     CARDIAC REHAB PHASE II ORIENTATION from 07/09/2017 in [place]  Referring Provider  Joshua Schick MD      Initial Encounter Date:    CARDIAC REHAB PHASE II ORIENTATION from 07/09/2017 in [place]  Date  07/09/17  Referring Provider  Joshua Schick MD      Visit Diagnosis: Status post coronary artery stent placement 06/24/2017  NSTEMI (non-ST elevated myocardial infarction) ([place]) 06/23/2017  Patient's Home Medications on Admission:  Current Outpatient Prescriptions:  .  amitriptyline (ELAVIL) 25 MG tablet, Take 25 mg by mouth at bedtime., Disp: , Rfl:  .  aspirin EC 81 MG EC tablet, Take 1 tablet (81 mg total) by mouth daily., Disp: 30 tablet, Rfl: 0 .  atenolol-chlorthalidone (TENORETIC) 100-25 MG tablet, Take 1 tablet by mouth daily., Disp: 90 tablet, Rfl: 3 .  cetirizine (ZYRTEC) 10 MG tablet, Take 10 mg by mouth daily., Disp: , Rfl:  .  citalopram (CELEXA) 20 MG tablet, Take 1 tablet (20 mg total) by mouth daily., Disp: 90 tablet, Rfl: 3 .  esomeprazole (NEXIUM) 20 MG capsule, Take 20 mg by mouth daily., Disp: , Rfl:  .  ezetimibe (ZETIA) 10 MG tablet, Take 10 mg by mouth daily., Disp: , Rfl:  .  gabapentin (NEURONTIN) 100 MG capsule, Take 200 mg by mouth 2 (two) times daily. IN CONJUNCTION WITH ONE 600 MG TABLET TO EQUAL A TOTAL DOSE OF 800 MILLIGRAMS, Disp: , Rfl: 3 .  gabapentin (NEURONTIN) 600 MG tablet, Take 600 mg by mouth 3 (three) times daily., Disp: , Rfl:  .  Multiple Vitamins-Minerals (CENTRUM SILVER 50+MEN) TABS, Take 1 tablet by mouth daily., Disp: , Rfl:  .  nitroGLYCERIN (NITROSTAT) 0.4 MG SL tablet, Place 1 tablet (0.4 mg total) under the tongue every 5 (five) minutes x 3 doses as needed for chest pain., Disp: 25 tablet, Rfl: 2 .  simvastatin (ZOCOR) 40 MG  tablet, Take 40 mg by mouth daily., Disp: , Rfl:  .  ticagrelor (BRILINTA) 90 MG TABS tablet, Take 1 tablet (90 mg total) by mouth 2 (two) times daily., Disp: 60 tablet, Rfl: 10 .  vitamin B-12 (CYANOCOBALAMIN) 1000 MCG tablet, Take 1,000 mcg by mouth daily., Disp: , Rfl:   Past Medical History: Past Medical History:  Diagnosis Date  . Anginal pain ([place])   . Coronary artery disease involving native coronary artery of native heart with angina pectoris ([place])    a. 1990 s/p CABG x 4 (RIMA->LAD, LIMA->LCX, VG->Diag, VG->RCA); b. 2011 s/p BMS to VG->RCA.; c. Unstable Angina 05/2017: Patent RIMA-LAD & LIMA-LCx, ostSVG-RCA 65% ISR & mSVG-RCA  99% - DES PCI to both  . Dyspnea on exertion    a. 01/2017 Echo: EF 55-60%, no rwma, mild Joshua, midlly dil LA, PASP 50mmHg.  [place] Kitchen GERD (gastroesophageal reflux disease)   . History of hiatal hernia   . Hyperlipidemia   . Hypertension   . Obesity     Tobacco Use: History  Smoking Status  . Never Smoker  Smokeless Tobacco  . Former Systems developer  . Types: Chew  . Quit date: 2000    Labs: Recent Review Flowsheet Data    Labs for ITP Cardiac and Pulmonary Rehab Latest Ref Rng & Units 02/15/2010 09/17/2011 06/02/2012 12/14/2014 05/14/2016   Cholestrol 0 - 200 mg/dL - 147 141 139 -  LDLCALC 0 - 99 mg/dL - 90 93 68 -   LDLDIRECT <130 mg/dL - - - - 84   HDL >39 mg/dL - 27(L) 29(L) 25(L) -   Trlycerides <150 mg/dL - 151(H) 96 231(H) -   TCO2 0 - 100 mmol/L 26 - - - -      Capillary Blood Glucose: No results found for: GLUCAP   Exercise Target Goals:    Exercise Program Goal: Individual exercise prescription set with THRR, safety & activity barriers. Participant demonstrates ability to understand and report RPE using BORG scale, to self-measure pulse accurately, and to acknowledge the importance of the exercise prescription.  Exercise Prescription Goal: Starting with aerobic activity 30 plus minutes a day, 3 days per week for initial exercise prescription.  Provide home exercise prescription and guidelines that participant acknowledges understanding prior to discharge.  Activity Barriers & Risk Stratification:     Activity Barriers & Cardiac Risk Stratification - 07/09/17 1608      Activity Barriers & Cardiac Risk Stratification   Activity Barriers Back Problems;Balance Concerns   Cardiac Risk Stratification High  history of CABG 1990      6 Minute Walk:     6 Minute Walk    Row [name] 07/09/17 1348         6 Minute Walk   Phase Initial     Distance 1311 feet     Walk Time 6 minutes     # of Rest Breaks 0     MPH 2.5     METS 2.3     RPE 9     VO2 Peak 8.1     Symptoms No     Resting HR 62 bpm     Resting BP 128/80     Max Ex. HR 84 bpm     Max Ex. BP 154/80     2 Minute Post BP 130/77        Oxygen Initial Assessment:   Oxygen Re-Evaluation:   Oxygen Discharge (Final Oxygen Re-Evaluation):   Initial Exercise Prescription:     Initial Exercise Prescription - 07/09/17 1300      Date of Initial Exercise RX and Referring Provider   Date 07/09/17   Referring Provider Joshua Schick MD     Bike   Level 0.7   Minutes 10   METs 2.5     NuStep   Level 2   SPM 85   Minutes 10   METs 2     Track   Laps 8   Minutes 10   METs 2.4     Prescription Details   Frequency (times per week) 3   Duration Progress to 45 minutes of aerobic exercise without signs/symptoms of physical distress     Intensity   THRR 40-80% of Max Heartrate 59-118   Ratings of Perceived Exertion 11-13   Perceived Dyspnea 0-4     Progression   Progression Continue to progress workloads to maintain intensity without signs/symptoms of physical distress.     Resistance Training   Training Prescription Yes   Weight 2   Reps 10-15      Perform Capillary Blood Glucose checks as needed.  Exercise Prescription Changes:     Exercise Prescription Changes    Row [name] 07/27/17 1700 08/11/17 0900           Response to Exercise    Blood Pressure (Admit) 122/72 124/64      Blood Pressure (Exercise) 140/68 126/64  Blood Pressure (Exit) 120/70 118/72      Heart Rate (Admit) 65 bpm 66 bpm      Heart Rate (Exercise) 90 bpm 106 bpm      Heart Rate (Exit) 65 bpm 66 bpm      Rating of Perceived Exertion (Exercise) 12 11      Symptoms  - none      Duration Progress to 30 minutes of  aerobic without signs/symptoms of physical distress Progress to 30 minutes of  aerobic without signs/symptoms of physical distress      Intensity THRR unchanged THRR unchanged        Progression   Progression Continue to progress workloads to maintain intensity without signs/symptoms of physical distress. Continue to progress workloads to maintain intensity without signs/symptoms of physical distress.      Average METs 3.2 3.2        Resistance Training   Training Prescription Yes Yes      Weight 5 5      Reps 10-15 10-15        Interval Training   Interval Training No No        Bike   Level 1.4 1.4      Minutes 10 10      METs 3.68 3.69        NuStep   Level 2 2      SPM 85 85      Minutes 10 10      METs 2.4 2.5        Track   Laps 15 14      Minutes 10 10      METs 3.43 3.43        Home Exercise Plan   Plans to continue exercise at  - Home (comment)  Pt has stationary bike at home.      Frequency  - Add 3 additional days to program exercise sessions.      Initial Home Exercises Provided  - 05/05/17         Exercise Comments:     Exercise Comments    Row [name] 08/05/17 1817 08/12/17 1440         Exercise Comments Reviewed home exercise prescription and METs. Reviewed METs and goals. Doing well with exercise.         Exercise Goals and Review:     Exercise Goals    Row [name] 07/09/17 0948             Exercise Goals   Increase Physical Activity Yes       Intervention Provide advice, education, support and counseling about physical activity/exercise needs.;Develop an individualized exercise  prescription for aerobic and resistive training based on initial evaluation findings, risk stratification, comorbidities and participant's personal goals.       Expected Outcomes Achievement of increased cardiorespiratory fitness and enhanced flexibility, muscular endurance and strength shown through measurements of functional capacity and personal statement of participant.       Increase Strength and Stamina Yes       Intervention Provide advice, education, support and counseling about physical activity/exercise needs.;Develop an individualized exercise prescription for aerobic and resistive training based on initial evaluation findings, risk stratification, comorbidities and participant's personal goals.       Expected Outcomes Achievement of increased cardiorespiratory fitness and enhanced flexibility, muscular endurance and strength shown through measurements of functional capacity and personal statement of participant.          Exercise Goals Re-Evaluation :  Exercise Goals Re-Evaluation    Joshua [name] 08/12/17 1441             Exercise Goal Re-Evaluation   Exercise Goals Review Increase Physical Activity;Increase Strength and Stamina       Comments Pt states he's not having SOB with activity. Pt states strength and stamina is a little better. Pt has lost 5 lbs. Pt is exercising 2-3 days at home in addition to CR.       Expected Outcomes Pt will continue exercise 5-6 days per week to achieve fitness and weight loss goals.           Discharge Exercise Prescription (Final Exercise Prescription Changes):     Exercise Prescription Changes - 08/11/17 0900      Response to Exercise   Blood Pressure (Admit) 124/64   Blood Pressure (Exercise) 126/64   Blood Pressure (Exit) 118/72   Heart Rate (Admit) 66 bpm   Heart Rate (Exercise) 106 bpm   Heart Rate (Exit) 66 bpm   Rating of Perceived Exertion (Exercise) 11   Symptoms none   Duration Progress to 30 minutes of  aerobic without  signs/symptoms of physical distress   Intensity THRR unchanged     Progression   Progression Continue to progress workloads to maintain intensity without signs/symptoms of physical distress.   Average METs 3.2     Resistance Training   Training Prescription Yes   Weight 5   Reps 10-15     Interval Training   Interval Training No     Bike   Level 1.4   Minutes 10   METs 3.69     NuStep   Level 2   SPM 85   Minutes 10   METs 2.5     Track   Laps 14   Minutes 10   METs 3.43     Home Exercise Plan   Plans to continue exercise at Home (comment)  Pt has stationary bike at home.   Frequency Add 3 additional days to program exercise sessions.   Initial Home Exercises Provided 05/05/17      Nutrition:  Target Goals: Understanding of nutrition guidelines, daily intake of sodium 1500mg , cholesterol 200mg , calories 30% from fat and 7% or less from saturated fats, daily to have 5 or more servings of fruits and vegetables.  Biometrics:     Pre Biometrics - 07/09/17 1610      Pre Biometrics   Height 5' 6.75" (1.695 m)   Weight 221 lb 1.9 oz (100.3 kg)   Waist Circumference 46.25 inches   Hip Circumference 45.5 inches   Waist to Hip Ratio 1.02 %   BMI (Calculated) 35   Triceps Skinfold 10 mm   % Body Fat 31.9 %   Grip Strength 34 kg   Flexibility 9 in   Single Leg Stand 13.2 seconds       Nutrition Therapy Plan and Nutrition Goals:     Nutrition Therapy & Goals - 07/15/17 1022      Nutrition Therapy   Diet Therapeutic Lifestyle Changes     Personal Nutrition Goals   Nutrition Goal Pt to identify and limit food sources of saturated fat, trans fat, and sodium   Personal Goal #2 Pt to identify food quantities necessary to achieve weight loss of 6-24 lb (2.7-10.9 kg) at graduation from cardiac rehab.      Intervention Plan   Intervention Prescribe, educate and counsel regarding individualized specific dietary modifications aiming towards targeted core  components  such as weight, hypertension, lipid management, diabetes, heart failure and other comorbidities.   Expected Outcomes Short Term Goal: Understand basic principles of dietary content, such as calories, fat, sodium, cholesterol and nutrients.;Long Term Goal: Adherence to prescribed nutrition plan.      Nutrition Discharge: Nutrition Scores:     Nutrition Assessments - 07/15/17 1022      MEDFICTS Scores   Pre Score 67      Nutrition Goals Re-Evaluation:   Nutrition Goals Re-Evaluation:   Nutrition Goals Discharge (Final Nutrition Goals Re-Evaluation):   Psychosocial: Target Goals: Acknowledge presence or absence of significant depression and/or stress, maximize coping skills, provide positive support system. Participant is able to verbalize types and ability to use techniques and skills needed for reducing stress and depression.  Initial Review & Psychosocial Screening:     Initial Psych Review & Screening - 07/09/17 1606      Initial Review   Current issues with None Identified     Family Dynamics   Good Support System? Yes     Barriers   Psychosocial barriers to participate in program There are no identifiable barriers or psychosocial needs.     Screening Interventions   Interventions Encouraged to exercise      Quality of Life Scores:     Quality of Life - 07/09/17 1407      Quality of Life Scores   Health/Function Pre 24.23 %   Socioeconomic Pre 25.36 %   Psych/Spiritual Pre 28.07 %   Family Pre 28.8 %   GLOBAL Pre 25.93 %      PHQ-9: Recent Review Flowsheet Data    Depression screen [facility] 2/9 07/13/2017 07/08/2017 02/18/2017 01/28/2017 05/14/2016   Decreased Interest 0 0 0 0 0   Down, Depressed, Hopeless 0 0 0 0 0   PHQ - 2 Score 0 0 0 0 0     Interpretation of Total Score  Total Score Depression Severity:  1-4 = Minimal depression, 5-9 = Mild depression, 10-14 = Moderate depression, 15-19 = Moderately severe depression, 20-27 = Severe  depression   Psychosocial Evaluation and Intervention:   Psychosocial Re-Evaluation:     Psychosocial Re-Evaluation    Ganado [name] 08/12/17 1757             Psychosocial Re-Evaluation   Current issues with None Identified       Interventions Encouraged to attend Cardiac Rehabilitation for the exercise       Continue Psychosocial Services  No Follow up required          Psychosocial Discharge (Final Psychosocial Re-Evaluation):     Psychosocial Re-Evaluation - 08/12/17 1757      Psychosocial Re-Evaluation   Current issues with None Identified   Interventions Encouraged to attend Cardiac Rehabilitation for the exercise   Continue Psychosocial Services  No Follow up required      Vocational Rehabilitation: Provide vocational rehab assistance to qualifying candidates.   Vocational Rehab Evaluation & Intervention:     Vocational Rehab - 07/09/17 1604      Initial Vocational Rehab Evaluation & Intervention   Assessment shows need for Vocational Rehabilitation No  Joshua [name] does not need vocational rehab at this time      Education: Education Goals: Education classes will be provided on a weekly basis, covering required topics. Participant will state understanding/return demonstration of topics presented.  Learning Barriers/Preferences:     Learning Barriers/Preferences - 07/09/17 0946      Learning Barriers/Preferences   Learning Barriers None  Learning Preferences Written Material;Skilled Demonstration      Education Topics: Count Your Pulse:  -Group instruction provided by verbal instruction, demonstration, patient participation and written materials to support subject.  Instructors address importance of being able to find your pulse and how to count your pulse when at home without a heart monitor.  Patients get hands on experience counting their pulse with staff help and individually.   CARDIAC REHAB PHASE II EXERCISE from 08/10/2017 in [place]  Date  08/07/17  Instruction Review Code  2- meets goals/outcomes      Heart Attack, Angina, and Risk Factor Modification:  -Group instruction provided by verbal instruction, video, and written materials to support subject.  Instructors address signs and symptoms of angina and heart attacks.    Also discuss risk factors for heart disease and how to make changes to improve heart health risk factors.   Functional Fitness:  -Group instruction provided by verbal instruction, demonstration, patient participation, and written materials to support subject.  Instructors address safety measures for doing things around the house.  Discuss how to get up and down off the floor, how to pick things up properly, how to safely get out of a chair without assistance, and balance training.   Meditation and Mindfulness:  -Group instruction provided by verbal instruction, patient participation, and written materials to support subject.  Instructor addresses importance of mindfulness and meditation practice to help reduce stress and improve awareness.  Instructor also leads participants through a meditation exercise.    Stretching for Flexibility and Mobility:  -Group instruction provided by verbal instruction, patient participation, and written materials to support subject.  Instructors lead participants through series of stretches that are designed to increase flexibility thus improving mobility.  These stretches are additional exercise for major muscle groups that are typically performed during regular warm up and cool down.   Hands Only CPR:  -Group verbal, video, and participation provides a basic overview of AHA guidelines for community CPR. Role-play of emergencies allow participants the opportunity to practice calling for help and chest compression technique with discussion of AED use.   Hypertension: -Group verbal and written instruction that provides a basic overview of  hypertension including the most recent diagnostic guidelines, risk factor reduction with self-care instructions and medication management.    Nutrition I class: Heart Healthy Eating:  -Group instruction provided by PowerPoint slides, verbal discussion, and written materials to support subject matter. The instructor gives an explanation and review of the Therapeutic Lifestyle Changes diet recommendations, which includes a discussion on lipid goals, dietary fat, sodium, fiber, plant stanol/sterol esters, sugar, and the components of a well-balanced, healthy diet.   CARDIAC REHAB PHASE II EXERCISE from 08/10/2017 in [place]  Date  08/04/17  Educator  RD  Instruction Review Code  2- meets goals/outcomes      Nutrition II class: Lifestyle Skills:  -Group instruction provided by PowerPoint slides, verbal discussion, and written materials to support subject matter. The instructor gives an explanation and review of label reading, grocery shopping for heart health, heart healthy recipe modifications, and ways to make healthier choices when eating out.   CARDIAC REHAB PHASE II EXERCISE from 08/10/2017 in [place]  Date  08/11/17  Educator  RD  Instruction Review Code  2- meets goals/outcomes      Diabetes Question & Answer:  -Group instruction provided by PowerPoint slides, verbal discussion, and written materials to support subject  matter. The instructor gives an explanation and review of diabetes co-morbidities, pre- and post-prandial blood glucose goals, pre-exercise blood glucose goals, signs, symptoms, and treatment of hypoglycemia and hyperglycemia, and foot care basics.   Diabetes Blitz:  -Group instruction provided by PowerPoint slides, verbal discussion, and written materials to support subject matter. The instructor gives an explanation and review of the physiology behind type 1 and type 2 diabetes, diabetes medications and  rational behind using different medications, pre- and post-prandial blood glucose recommendations and Hemoglobin A1c goals, diabetes diet, and exercise including blood glucose guidelines for exercising safely.    Portion Distortion:  -Group instruction provided by PowerPoint slides, verbal discussion, written materials, and food models to support subject matter. The instructor gives an explanation of serving size versus portion size, changes in portions sizes over the last 20 years, and what consists of a serving from each food group.   Stress Management:  -Group instruction provided by verbal instruction, video, and written materials to support subject matter.  Instructors review role of stress in heart disease and how to cope with stress positively.     Exercising on Your Own:  -Group instruction provided by verbal instruction, power point, and written materials to support subject.  Instructors discuss benefits of exercise, components of exercise, frequency and intensity of exercise, and end points for exercise.  Also discuss use of nitroglycerin and activating EMS.  Review options of places to exercise outside of rehab.  Review guidelines for sex with heart disease.   Cardiac Drugs I:  -Group instruction provided by verbal instruction and written materials to support subject.  Instructor reviews cardiac drug classes: antiplatelets, anticoagulants, beta blockers, and statins.  Instructor discusses reasons, side effects, and lifestyle considerations for each drug class.   Cardiac Drugs II:  -Group instruction provided by verbal instruction and written materials to support subject.  Instructor reviews cardiac drug classes: angiotensin converting enzyme inhibitors (ACE-I), angiotensin II receptor blockers (ARBs), nitrates, and calcium channel blockers.  Instructor discusses reasons, side effects, and lifestyle considerations for each drug class.   Anatomy and Physiology of the Circulatory  System:  Group verbal and written instruction and models provide basic cardiac anatomy and physiology, with the coronary electrical and arterial systems. Review of: AMI, Angina, Valve disease, Heart Failure, Peripheral Artery Disease, Cardiac Arrhythmia, Pacemakers, and the ICD.   Other Education:  -Group or individual verbal, written, or video instructions that support the educational goals of the cardiac rehab program.   Knowledge Questionnaire Score:     Knowledge Questionnaire Score - 07/09/17 1406      Knowledge Questionnaire Score   Pre Score 24/24      Core Components/Risk Factors/Patient Goals at Admission:     Personal Goals and Risk Factors at Admission - 07/09/17 1407      Core Components/Risk Factors/Patient Goals on Admission    Weight Management Yes;Obesity   Intervention Weight Management: Develop a combined nutrition and exercise program designed to reach desired caloric intake, while maintaining appropriate intake of nutrient and fiber, sodium and fats, and appropriate energy expenditure required for the weight goal.   Expected Outcomes Short Term: Continue to assess and modify interventions until short term weight is achieved   Hypertension Yes   Intervention Provide education on lifestyle modifcations including regular physical activity/exercise, weight management, moderate sodium restriction and increased consumption of fresh fruit, vegetables, and low fat dairy, alcohol moderation, and smoking cessation.;Monitor prescription use compliance.   Expected Outcomes Long Term: Maintenance of blood pressure at  goal levels.;Short Term: Continued assessment and intervention until BP is < 140/52mm HG in hypertensive participants. < 130/51mm HG in hypertensive participants with diabetes, heart failure or chronic kidney disease.   Lipids Yes   Intervention Provide education and support for participant on nutrition & aerobic/resistive exercise along with prescribed medications  to achieve LDL 70mg , HDL >40mg .   Expected Outcomes Short Term: Participant states understanding of desired cholesterol values and is compliant with medications prescribed. Participant is following exercise prescription and nutrition guidelines.;Long Term: Cholesterol controlled with medications as prescribed, with individualized exercise RX and with personalized nutrition plan. Value goals: LDL < 70mg , HDL > 40 mg.      Core Components/Risk Factors/Patient Goals Review:      Goals and Risk Factor Review    Row [name] 08/12/17 1753             Core Components/Risk Factors/Patient Goals Review   Personal Goals Review Weight Management/Obesity;Lipids;Hypertension       Review Joshua [name] is doing well with exercise. Joshua [name]'s vital signs have been stable       Expected Outcomes Joshua [name] will continue to exercise, monitor blood pressure and take medications as presribed including cholesterol medications          Core Components/Risk Factors/Patient Goals at Discharge (Final Review):      Goals and Risk Factor Review - 08/12/17 1753      Core Components/Risk Factors/Patient Goals Review   Personal Goals Review Weight Management/Obesity;Lipids;Hypertension   Review Joshua [name] is doing well with exercise. Joshua [name]'s vital signs have been stable   Expected Outcomes Joshua [name] will continue to exercise, monitor blood pressure and take medications as presribed including cholesterol medications      ITP Comments:     ITP Comments    Row [name] 07/09/17 0947           ITP Comments Dr. Fransico Him, Medical Director          Comments:.Joshua [name] is making expected progress toward personal goals after completing 13 sessions. Recommend continued exercise and life style modification education including  stress management and relaxation techniques to decrease cardiac risk profile. Joshua [name] is doing well with exercise.Barnet Pall, RN,BSN 08/12/2017 6:01 PM

## 2017-08-14 ENCOUNTER — Encounter (HOSPITAL_COMMUNITY)
Admission: RE | Admit: 2017-08-14 | Discharge: 2017-08-14 | Disposition: A | Discharge status: Home / Self Care | Payer: PPO | Source: Ambulatory Visit | Attending: Interventional Cardiology | Admitting: Interventional Cardiology

## 2017-08-14 ENCOUNTER — Encounter (HOSPITAL_COMMUNITY): Payer: PPO

## 2017-08-14 DIAGNOSIS — Z955 Presence of coronary angioplasty implant and graft: Secondary | ICD-10-CM

## 2017-08-14 DIAGNOSIS — I214 Non-ST elevation (NSTEMI) myocardial infarction: Secondary | ICD-10-CM

## 2017-08-17 ENCOUNTER — Encounter (HOSPITAL_COMMUNITY): Payer: PPO

## 2017-08-17 ENCOUNTER — Encounter (HOSPITAL_COMMUNITY)
Admission: RE | Admit: 2017-08-17 | Discharge: 2017-08-17 | Disposition: A | Discharge status: Home / Self Care | Payer: PPO | Source: Ambulatory Visit | Attending: Interventional Cardiology | Admitting: Interventional Cardiology

## 2017-08-17 DIAGNOSIS — Z955 Presence of coronary angioplasty implant and graft: Secondary | ICD-10-CM

## 2017-08-17 DIAGNOSIS — I214 Non-ST elevation (NSTEMI) myocardial infarction: Secondary | ICD-10-CM | POA: Diagnosis not present

## 2017-08-19 ENCOUNTER — Encounter (HOSPITAL_COMMUNITY)
Admission: RE | Admit: 2017-08-19 | Discharge: 2017-08-19 | Disposition: A | Discharge status: Home / Self Care | Payer: PPO | Source: Ambulatory Visit | Attending: Interventional Cardiology | Admitting: Interventional Cardiology

## 2017-08-19 ENCOUNTER — Encounter (HOSPITAL_COMMUNITY): Payer: PPO

## 2017-08-19 DIAGNOSIS — I214 Non-ST elevation (NSTEMI) myocardial infarction: Secondary | ICD-10-CM | POA: Diagnosis not present

## 2017-08-19 DIAGNOSIS — Z955 Presence of coronary angioplasty implant and graft: Secondary | ICD-10-CM

## 2017-08-21 ENCOUNTER — Encounter (HOSPITAL_COMMUNITY)
Admission: RE | Admit: 2017-08-21 | Discharge: 2017-08-21 | Disposition: A | Discharge status: Home / Self Care | Payer: PPO | Source: Ambulatory Visit | Attending: Interventional Cardiology | Admitting: Interventional Cardiology

## 2017-08-21 ENCOUNTER — Encounter (HOSPITAL_COMMUNITY): Payer: PPO

## 2017-08-21 DIAGNOSIS — I214 Non-ST elevation (NSTEMI) myocardial infarction: Secondary | ICD-10-CM | POA: Diagnosis not present

## 2017-08-21 DIAGNOSIS — Z955 Presence of coronary angioplasty implant and graft: Secondary | ICD-10-CM

## 2017-08-24 ENCOUNTER — Encounter (HOSPITAL_COMMUNITY)
Admission: RE | Admit: 2017-08-24 | Discharge: 2017-08-24 | Disposition: A | Discharge status: Home / Self Care | Payer: PPO | Source: Ambulatory Visit | Attending: Interventional Cardiology | Admitting: Interventional Cardiology

## 2017-08-24 ENCOUNTER — Encounter (HOSPITAL_COMMUNITY): Payer: PPO

## 2017-08-24 DIAGNOSIS — I214 Non-ST elevation (NSTEMI) myocardial infarction: Secondary | ICD-10-CM | POA: Diagnosis not present

## 2017-08-24 DIAGNOSIS — Z955 Presence of coronary angioplasty implant and graft: Secondary | ICD-10-CM

## 2017-08-26 ENCOUNTER — Encounter (HOSPITAL_COMMUNITY)
Admission: RE | Admit: 2017-08-26 | Discharge: 2017-08-26 | Disposition: A | Discharge status: Home / Self Care | Payer: PPO | Source: Ambulatory Visit | Attending: Interventional Cardiology | Admitting: Interventional Cardiology

## 2017-08-26 ENCOUNTER — Encounter (HOSPITAL_COMMUNITY): Payer: PPO

## 2017-08-26 DIAGNOSIS — I214 Non-ST elevation (NSTEMI) myocardial infarction: Secondary | ICD-10-CM

## 2017-08-26 DIAGNOSIS — Z955 Presence of coronary angioplasty implant and graft: Secondary | ICD-10-CM

## 2017-08-28 ENCOUNTER — Encounter (HOSPITAL_COMMUNITY)
Admission: RE | Admit: 2017-08-28 | Discharge: 2017-08-28 | Disposition: A | Discharge status: Home / Self Care | Payer: PPO | Source: Ambulatory Visit | Attending: Interventional Cardiology | Admitting: Interventional Cardiology

## 2017-08-28 ENCOUNTER — Encounter (HOSPITAL_COMMUNITY): Payer: PPO

## 2017-08-28 DIAGNOSIS — Z955 Presence of coronary angioplasty implant and graft: Secondary | ICD-10-CM

## 2017-08-28 DIAGNOSIS — I214 Non-ST elevation (NSTEMI) myocardial infarction: Secondary | ICD-10-CM

## 2017-08-31 ENCOUNTER — Encounter (HOSPITAL_COMMUNITY): Payer: PPO

## 2017-08-31 ENCOUNTER — Encounter (HOSPITAL_COMMUNITY)
Admission: RE | Admit: 2017-08-31 | Discharge: 2017-08-31 | Disposition: A | Discharge status: Home / Self Care | Payer: PPO | Source: Ambulatory Visit | Attending: Interventional Cardiology | Admitting: Interventional Cardiology

## 2017-08-31 DIAGNOSIS — Z7982 Long term (current) use of aspirin: Secondary | ICD-10-CM | POA: Insufficient documentation

## 2017-08-31 DIAGNOSIS — E785 Hyperlipidemia, unspecified: Secondary | ICD-10-CM | POA: Diagnosis not present

## 2017-08-31 DIAGNOSIS — Z79899 Other long term (current) drug therapy: Secondary | ICD-10-CM | POA: Insufficient documentation

## 2017-08-31 DIAGNOSIS — I214 Non-ST elevation (NSTEMI) myocardial infarction: Secondary | ICD-10-CM | POA: Insufficient documentation

## 2017-08-31 DIAGNOSIS — E669 Obesity, unspecified: Secondary | ICD-10-CM | POA: Insufficient documentation

## 2017-08-31 DIAGNOSIS — Z951 Presence of aortocoronary bypass graft: Secondary | ICD-10-CM | POA: Diagnosis not present

## 2017-08-31 DIAGNOSIS — K219 Gastro-esophageal reflux disease without esophagitis: Secondary | ICD-10-CM | POA: Insufficient documentation

## 2017-08-31 DIAGNOSIS — Z87891 Personal history of nicotine dependence: Secondary | ICD-10-CM | POA: Diagnosis not present

## 2017-08-31 DIAGNOSIS — I2510 Atherosclerotic heart disease of native coronary artery without angina pectoris: Secondary | ICD-10-CM | POA: Diagnosis not present

## 2017-08-31 DIAGNOSIS — Z955 Presence of coronary angioplasty implant and graft: Secondary | ICD-10-CM | POA: Insufficient documentation

## 2017-08-31 DIAGNOSIS — Z6834 Body mass index (BMI) 34.0-34.9, adult: Secondary | ICD-10-CM | POA: Diagnosis not present

## 2017-08-31 DIAGNOSIS — I10 Essential (primary) hypertension: Secondary | ICD-10-CM | POA: Diagnosis not present

## 2017-09-02 ENCOUNTER — Encounter (HOSPITAL_COMMUNITY): Payer: PPO

## 2017-09-02 ENCOUNTER — Encounter (HOSPITAL_COMMUNITY)
Admission: RE | Admit: 2017-09-02 | Discharge: 2017-09-02 | Disposition: A | Discharge status: Home / Self Care | Payer: PPO | Source: Ambulatory Visit | Attending: Interventional Cardiology | Admitting: Interventional Cardiology

## 2017-09-02 DIAGNOSIS — I214 Non-ST elevation (NSTEMI) myocardial infarction: Secondary | ICD-10-CM | POA: Diagnosis not present

## 2017-09-02 DIAGNOSIS — Z955 Presence of coronary angioplasty implant and graft: Secondary | ICD-10-CM

## 2017-09-04 ENCOUNTER — Encounter (HOSPITAL_COMMUNITY): Payer: PPO

## 2017-09-04 ENCOUNTER — Encounter (HOSPITAL_COMMUNITY)
Admission: RE | Admit: 2017-09-04 | Discharge: 2017-09-04 | Disposition: A | Discharge status: Home / Self Care | Payer: PPO | Source: Ambulatory Visit | Attending: Interventional Cardiology | Admitting: Interventional Cardiology

## 2017-09-04 DIAGNOSIS — I214 Non-ST elevation (NSTEMI) myocardial infarction: Secondary | ICD-10-CM | POA: Diagnosis not present

## 2017-09-04 DIAGNOSIS — Z955 Presence of coronary angioplasty implant and graft: Secondary | ICD-10-CM

## 2017-09-07 ENCOUNTER — Encounter (HOSPITAL_COMMUNITY): Payer: PPO

## 2017-09-07 ENCOUNTER — Encounter (HOSPITAL_COMMUNITY)
Admission: RE | Admit: 2017-09-07 | Discharge: 2017-09-07 | Disposition: A | Discharge status: Home / Self Care | Payer: PPO | Source: Ambulatory Visit | Attending: Interventional Cardiology | Admitting: Interventional Cardiology

## 2017-09-07 DIAGNOSIS — Z955 Presence of coronary angioplasty implant and graft: Secondary | ICD-10-CM

## 2017-09-07 DIAGNOSIS — I214 Non-ST elevation (NSTEMI) myocardial infarction: Secondary | ICD-10-CM | POA: Diagnosis not present

## 2017-09-09 ENCOUNTER — Encounter (HOSPITAL_COMMUNITY): Payer: PPO

## 2017-09-09 ENCOUNTER — Encounter (HOSPITAL_COMMUNITY)
Admission: RE | Admit: 2017-09-09 | Discharge: 2017-09-09 | Disposition: A | Discharge status: Home / Self Care | Payer: PPO | Source: Ambulatory Visit | Attending: Interventional Cardiology | Admitting: Interventional Cardiology

## 2017-09-09 DIAGNOSIS — Z955 Presence of coronary angioplasty implant and graft: Secondary | ICD-10-CM

## 2017-09-09 DIAGNOSIS — I214 Non-ST elevation (NSTEMI) myocardial infarction: Secondary | ICD-10-CM | POA: Diagnosis not present

## 2017-09-09 NOTE — Progress Notes (Signed)
Cardiac Individual Treatment Plan  Patient Details  Name: Joshua [name]. MRN: [id number] Date of Birth: 1941-04-30 Referring Provider:     CARDIAC REHAB PHASE II ORIENTATION from 07/09/2017 in [place]  Referring Provider  Daneen Schick MD      Initial Encounter Date:    CARDIAC REHAB PHASE II ORIENTATION from 07/09/2017 in [place]  Date  07/09/17  Referring Provider  Daneen Schick MD      Visit Diagnosis: NSTEMI (non-ST elevated myocardial infarction) ([place]) 06/23/2017  Status post coronary artery stent placement 06/24/2017  Patient's Home Medications on Admission:  Current Outpatient Prescriptions:  .  amitriptyline (ELAVIL) 25 MG tablet, Take 25 mg by mouth at bedtime., Disp: , Rfl:  .  aspirin EC 81 MG EC tablet, Take 1 tablet (81 mg total) by mouth daily., Disp: 30 tablet, Rfl: 0 .  atenolol-chlorthalidone (TENORETIC) 100-25 MG tablet, Take 1 tablet by mouth daily., Disp: 90 tablet, Rfl: 3 .  cetirizine (ZYRTEC) 10 MG tablet, Take 10 mg by mouth daily., Disp: , Rfl:  .  citalopram (CELEXA) 20 MG tablet, Take 1 tablet (20 mg total) by mouth daily., Disp: 90 tablet, Rfl: 3 .  esomeprazole (NEXIUM) 20 MG capsule, Take 20 mg by mouth daily., Disp: , Rfl:  .  ezetimibe (ZETIA) 10 MG tablet, Take 10 mg by mouth daily., Disp: , Rfl:  .  gabapentin (NEURONTIN) 100 MG capsule, Take 200 mg by mouth 2 (two) times daily. IN CONJUNCTION WITH ONE 600 MG TABLET TO EQUAL A TOTAL DOSE OF 800 MILLIGRAMS, Disp: , Rfl: 3 .  gabapentin (NEURONTIN) 600 MG tablet, Take 600 mg by mouth 3 (three) times daily., Disp: , Rfl:  .  Multiple Vitamins-Minerals (CENTRUM SILVER 50+MEN) TABS, Take 1 tablet by mouth daily., Disp: , Rfl:  .  nitroGLYCERIN (NITROSTAT) 0.4 MG SL tablet, Place 1 tablet (0.4 mg total) under the tongue every 5 (five) minutes x 3 doses as needed for chest pain., Disp: 25 tablet, Rfl: 2 .  simvastatin (ZOCOR) 40 MG  tablet, Take 40 mg by mouth daily., Disp: , Rfl:  .  ticagrelor (BRILINTA) 90 MG TABS tablet, Take 1 tablet (90 mg total) by mouth 2 (two) times daily., Disp: 60 tablet, Rfl: 10 .  vitamin B-12 (CYANOCOBALAMIN) 1000 MCG tablet, Take 1,000 mcg by mouth daily., Disp: , Rfl:   Past Medical History: Past Medical History:  Diagnosis Date  . Anginal pain ([place])   . Coronary artery disease involving native coronary artery of native heart with angina pectoris ([place])    a. 1990 s/p CABG x 4 (RIMA->LAD, LIMA->LCX, VG->Diag, VG->RCA); b. 2011 s/p BMS to VG->RCA.; c. Unstable Angina 05/2017: Patent RIMA-LAD & LIMA-LCx, ostSVG-RCA 65% ISR & mSVG-RCA  99% - DES PCI to both  . Dyspnea on exertion    a. 01/2017 Echo: EF 55-60%, no rwma, mild MR, midlly dil LA, PASP 68mmHg.  [place] Kitchen GERD (gastroesophageal reflux disease)   . History of hiatal hernia   . Hyperlipidemia   . Hypertension   . Obesity     Tobacco Use: History  Smoking Status  . Never Smoker  Smokeless Tobacco  . Former Systems developer  . Types: Chew  . Quit date: 2000    Labs: Recent Review Flowsheet Data    Labs for ITP Cardiac and Pulmonary Rehab Latest Ref Rng & Units 02/15/2010 09/17/2011 06/02/2012 12/14/2014 05/14/2016   Cholestrol 0 - 200 mg/dL - 147 141 139 -  LDLCALC 0 - 99 mg/dL - 90 93 68 -   LDLDIRECT <130 mg/dL - - - - 84   HDL >39 mg/dL - 27(L) 29(L) 25(L) -   Trlycerides <150 mg/dL - 151(H) 96 231(H) -   TCO2 0 - 100 mmol/L 26 - - - -      Capillary Blood Glucose: No results found for: GLUCAP   Exercise Target Goals:    Exercise Program Goal: Individual exercise prescription set with THRR, safety & activity barriers. Participant demonstrates ability to understand and report RPE using BORG scale, to self-measure pulse accurately, and to acknowledge the importance of the exercise prescription.  Exercise Prescription Goal: Starting with aerobic activity 30 plus minutes a day, 3 days per week for initial exercise prescription.  Provide home exercise prescription and guidelines that participant acknowledges understanding prior to discharge.  Activity Barriers & Risk Stratification:     Activity Barriers & Cardiac Risk Stratification - 07/09/17 1608      Activity Barriers & Cardiac Risk Stratification   Activity Barriers Back Problems;Balance Concerns   Cardiac Risk Stratification High  history of CABG 1990      6 Minute Walk:     6 Minute Walk    Row Name 07/09/17 1348         6 Minute Walk   Phase Initial     Distance 1311 feet     Walk Time 6 minutes     # of Rest Breaks 0     MPH 2.5     METS 2.3     RPE 9     VO2 Peak 8.1     Symptoms No     Resting HR 62 bpm     Resting BP 128/80     Max Ex. HR 84 bpm     Max Ex. BP 154/80     2 Minute Post BP 130/77        Oxygen Initial Assessment:   Oxygen Re-Evaluation:   Oxygen Discharge (Final Oxygen Re-Evaluation):   Initial Exercise Prescription:     Initial Exercise Prescription - 07/09/17 1300      Date of Initial Exercise RX and Referring Provider   Date 07/09/17   Referring Provider Daneen Schick MD     Bike   Level 0.7   Minutes 10   METs 2.5     NuStep   Level 2   SPM 85   Minutes 10   METs 2     Track   Laps 8   Minutes 10   METs 2.4     Prescription Details   Frequency (times per week) 3   Duration Progress to 45 minutes of aerobic exercise without signs/symptoms of physical distress     Intensity   THRR 40-80% of Max Heartrate 59-118   Ratings of Perceived Exertion 11-13   Perceived Dyspnea 0-4     Progression   Progression Continue to progress workloads to maintain intensity without signs/symptoms of physical distress.     Resistance Training   Training Prescription Yes   Weight 2   Reps 10-15      Perform Capillary Blood Glucose checks as needed.  Exercise Prescription Changes:     Exercise Prescription Changes    Row Name 07/27/17 1700 08/11/17 0900 08/24/17 1700 09/07/17 1500        Response to Exercise   Blood Pressure (Admit) 122/72 124/64 122/66 118/60    Blood Pressure (Exercise) 140/68 126/64 120/70 128/60  Blood Pressure (Exit) 120/70 118/72 122/72 112/70    Heart Rate (Admit) 65 bpm 66 bpm 61 bpm 66 bpm    Heart Rate (Exercise) 90 bpm 106 bpm 92 bpm 89 bpm    Heart Rate (Exit) 65 bpm 66 bpm 60 bpm 70 bpm    Rating of Perceived Exertion (Exercise) 12 11 12 12     Symptoms  - none none none    Duration Progress to 30 minutes of  aerobic without signs/symptoms of physical distress Progress to 30 minutes of  aerobic without signs/symptoms of physical distress Progress to 30 minutes of  aerobic without signs/symptoms of physical distress Progress to 30 minutes of  aerobic without signs/symptoms of physical distress    Intensity THRR unchanged THRR unchanged THRR unchanged THRR unchanged      Progression   Progression Continue to progress workloads to maintain intensity without signs/symptoms of physical distress. Continue to progress workloads to maintain intensity without signs/symptoms of physical distress. Continue to progress workloads to maintain intensity without signs/symptoms of physical distress. Continue to progress workloads to maintain intensity without signs/symptoms of physical distress.    Average METs 3.2 3.2 3.2 3.7      Resistance Training   Training Prescription Yes Yes Yes Yes    Weight 5 5 5 5     Reps 10-15 10-15 10-15 10-15    Time  -  - 10 Minutes 10 Minutes      Interval Training   Interval Training No No No No      Bike   Level 1.4 1.4 1.4 1.6    Minutes 10 10 10 10     METs 3.68 3.69 3.7 4.13      NuStep   Level 2 2 4 4     SPM 85 85 85 85    Minutes 10 10 10 10     METs 2.4 2.5 2.9 3.5      Track   Laps 15 14 14 15     Minutes 10 10 10 10     METs 3.43 3.43 3.43 3.6      Home Exercise Plan   Plans to continue exercise at  - Home (comment)  Pt has stationary bike at home. Home (comment)  Pt has stationary bike at home. Home  (comment)  Pt has stationary bike at home.    Frequency  - Add 3 additional days to program exercise sessions. Add 3 additional days to program exercise sessions. Add 3 additional days to program exercise sessions.    Initial Home Exercises Provided  - 05/05/17 05/05/17 05/05/17       Exercise Comments:     Exercise Comments    Row Name 08/05/17 1817 08/12/17 1440 08/26/17 1109 09/09/17 1108     Exercise Comments Reviewed home exercise prescription and METs. Reviewed METs and goals. Doing well with exercise. Reviewed METs with patient. Reviewed METs and goals with patient.       Exercise Goals and Review:     Exercise Goals    Row Name 07/09/17 0948             Exercise Goals   Increase Physical Activity Yes       Intervention Provide advice, education, support and counseling about physical activity/exercise needs.;Develop an individualized exercise prescription for aerobic and resistive training based on initial evaluation findings, risk stratification, comorbidities and participant's personal goals.       Expected Outcomes Achievement of increased cardiorespiratory fitness and enhanced flexibility, muscular endurance and strength shown through  measurements of functional capacity and personal statement of participant.       Increase Strength and Stamina Yes       Intervention Provide advice, education, support and counseling about physical activity/exercise needs.;Develop an individualized exercise prescription for aerobic and resistive training based on initial evaluation findings, risk stratification, comorbidities and participant's personal goals.       Expected Outcomes Achievement of increased cardiorespiratory fitness and enhanced flexibility, muscular endurance and strength shown through measurements of functional capacity and personal statement of participant.          Exercise Goals Re-Evaluation :     Exercise Goals Re-Evaluation    Row Name 08/12/17 1441 08/26/17  1109 09/09/17 1108         Exercise Goal Re-Evaluation   Exercise Goals Review Increase Physical Activity;Increase Strength and Stamina Knowledge and understanding of Target Heart Rate Range (THRR) Increase Physical Activity     Comments Pt states he's not having SOB with activity. Pt states strength and stamina is a little better. Pt has lost 5 lbs. Pt is exercising 2-3 days at home in addition to CR. Reviewed THRR with patient. Increasing workloads as tolerated. Patient is doing well with exercise, achieving 3.7 METs.     Expected Outcomes Pt will continue exercise 5-6 days per week to achieve fitness and weight loss goals. Pt will continue exercise 5-6 days per week to achieve fitness and weight loss goals. Continue riding staionary bike 30 minutes, 3 days/week at home in addition to exercise at [facility].         Discharge Exercise Prescription (Final Exercise Prescription Changes):     Exercise Prescription Changes - 09/07/17 1500      Response to Exercise   Blood Pressure (Admit) 118/60   Blood Pressure (Exercise) 128/60   Blood Pressure (Exit) 112/70   Heart Rate (Admit) 66 bpm   Heart Rate (Exercise) 89 bpm   Heart Rate (Exit) 70 bpm   Rating of Perceived Exertion (Exercise) 12   Symptoms none   Duration Progress to 30 minutes of  aerobic without signs/symptoms of physical distress   Intensity THRR unchanged     Progression   Progression Continue to progress workloads to maintain intensity without signs/symptoms of physical distress.   Average METs 3.7     Resistance Training   Training Prescription Yes   Weight 5   Reps 10-15   Time 10 Minutes     Interval Training   Interval Training No     Bike   Level 1.6   Minutes 10   METs 4.13     NuStep   Level 4   SPM 85   Minutes 10   METs 3.5     Track   Laps 15   Minutes 10   METs 3.6     Home Exercise Plan   Plans to continue exercise at Home (comment)  Pt has stationary bike at home.   Frequency Add 3  additional days to program exercise sessions.   Initial Home Exercises Provided 05/05/17      Nutrition:  Target Goals: Understanding of nutrition guidelines, daily intake of sodium 1500mg , cholesterol 200mg , calories 30% from fat and 7% or less from saturated fats, daily to have 5 or more servings of fruits and vegetables.  Biometrics:     Pre Biometrics - 07/09/17 1610      Pre Biometrics   Height 5' 6.75" (1.695 m)   Weight 221 lb 1.9 oz (100.3 kg)  Waist Circumference 46.25 inches   Hip Circumference 45.5 inches   Waist to Hip Ratio 1.02 %   BMI (Calculated) 35   Triceps Skinfold 10 mm   % Body Fat 31.9 %   Grip Strength 34 kg   Flexibility 9 in   Single Leg Stand 13.2 seconds       Nutrition Therapy Plan and Nutrition Goals:     Nutrition Therapy & Goals - 07/15/17 1022      Nutrition Therapy   Diet Therapeutic Lifestyle Changes     Personal Nutrition Goals   Nutrition Goal Pt to identify and limit food sources of saturated fat, trans fat, and sodium   Personal Goal #2 Pt to identify food quantities necessary to achieve weight loss of 6-24 lb (2.7-10.9 kg) at graduation from cardiac rehab.      Intervention Plan   Intervention Prescribe, educate and counsel regarding individualized specific dietary modifications aiming towards targeted core components such as weight, hypertension, lipid management, diabetes, heart failure and other comorbidities.   Expected Outcomes Short Term Goal: Understand basic principles of dietary content, such as calories, fat, sodium, cholesterol and nutrients.;Long Term Goal: Adherence to prescribed nutrition plan.      Nutrition Discharge: Nutrition Scores:     Nutrition Assessments - 07/15/17 1022      MEDFICTS Scores   Pre Score 67      Nutrition Goals Re-Evaluation:   Nutrition Goals Re-Evaluation:   Nutrition Goals Discharge (Final Nutrition Goals Re-Evaluation):   Psychosocial: Target Goals: Acknowledge  presence or absence of significant depression and/or stress, maximize coping skills, provide positive support system. Participant is able to verbalize types and ability to use techniques and skills needed for reducing stress and depression.  Initial Review & Psychosocial Screening:     Initial Psych Review & Screening - 07/09/17 1606      Initial Review   Current issues with None Identified     Family Dynamics   Good Support System? Yes     Barriers   Psychosocial barriers to participate in program There are no identifiable barriers or psychosocial needs.     Screening Interventions   Interventions Encouraged to exercise      Quality of Life Scores:     Quality of Life - 07/09/17 1407      Quality of Life Scores   Health/Function Pre 24.23 %   Socioeconomic Pre 25.36 %   Psych/Spiritual Pre 28.07 %   Family Pre 28.8 %   GLOBAL Pre 25.93 %      PHQ-9: Recent Review Flowsheet Data    Depression screen [facility] 2/9 07/13/2017 07/08/2017 02/18/2017 01/28/2017 05/14/2016   Decreased Interest 0 0 0 0 0   Down, Depressed, Hopeless 0 0 0 0 0   PHQ - 2 Score 0 0 0 0 0     Interpretation of Total Score  Total Score Depression Severity:  1-4 = Minimal depression, 5-9 = Mild depression, 10-14 = Moderate depression, 15-19 = Moderately severe depression, 20-27 = Severe depression   Psychosocial Evaluation and Intervention:   Psychosocial Re-Evaluation:     Psychosocial Re-Evaluation    [place] Name 08/12/17 1757 09/09/17 1224           Psychosocial Re-Evaluation   Current issues with None Identified None Identified      Interventions Encouraged to attend Cardiac Rehabilitation for the exercise Encouraged to attend Cardiac Rehabilitation for the exercise      Continue Psychosocial Services  No Follow up required  No Follow up required         Psychosocial Discharge (Final Psychosocial Re-Evaluation):     Psychosocial Re-Evaluation - 09/09/17 1224      Psychosocial  Re-Evaluation   Current issues with None Identified   Interventions Encouraged to attend Cardiac Rehabilitation for the exercise   Continue Psychosocial Services  No Follow up required      Vocational Rehabilitation: Provide vocational rehab assistance to qualifying candidates.   Vocational Rehab Evaluation & Intervention:     Vocational Rehab - 07/09/17 1604      Initial Vocational Rehab Evaluation & Intervention   Assessment shows need for Vocational Rehabilitation No  Mr Antenucci does not need vocational rehab at this time      Education: Education Goals: Education classes will be provided on a weekly basis, covering required topics. Participant will state understanding/return demonstration of topics presented.  Learning Barriers/Preferences:     Learning Barriers/Preferences - 07/09/17 0946      Learning Barriers/Preferences   Learning Barriers None   Learning Preferences Written Material;Skilled Demonstration      Education Topics: Count Your Pulse:  -Group instruction provided by verbal instruction, demonstration, patient participation and written materials to support subject.  Instructors address importance of being able to find your pulse and how to count your pulse when at home without a heart monitor.  Patients get hands on experience counting their pulse with staff help and individually.   CARDIAC REHAB PHASE II EXERCISE from 08/10/2017 in [place]  Date  08/07/17  Instruction Review Code  2- meets goals/outcomes      Heart Attack, Angina, and Risk Factor Modification:  -Group instruction provided by verbal instruction, video, and written materials to support subject.  Instructors address signs and symptoms of angina and heart attacks.    Also discuss risk factors for heart disease and how to make changes to improve heart health risk factors.   Functional Fitness:  -Group instruction provided by verbal instruction,  demonstration, patient participation, and written materials to support subject.  Instructors address safety measures for doing things around the house.  Discuss how to get up and down off the floor, how to pick things up properly, how to safely get out of a chair without assistance, and balance training.   Meditation and Mindfulness:  -Group instruction provided by verbal instruction, patient participation, and written materials to support subject.  Instructor addresses importance of mindfulness and meditation practice to help reduce stress and improve awareness.  Instructor also leads participants through a meditation exercise.    Stretching for Flexibility and Mobility:  -Group instruction provided by verbal instruction, patient participation, and written materials to support subject.  Instructors lead participants through series of stretches that are designed to increase flexibility thus improving mobility.  These stretches are additional exercise for major muscle groups that are typically performed during regular warm up and cool down.   Hands Only CPR:  -Group verbal, video, and participation provides a basic overview of AHA guidelines for community CPR. Role-play of emergencies allow participants the opportunity to practice calling for help and chest compression technique with discussion of AED use.   Hypertension: -Group verbal and written instruction that provides a basic overview of hypertension including the most recent diagnostic guidelines, risk factor reduction with self-care instructions and medication management.    Nutrition I class: Heart Healthy Eating:  -Group instruction provided by PowerPoint slides, verbal discussion, and written materials to support subject matter. The instructor gives  an explanation and review of the Therapeutic Lifestyle Changes diet recommendations, which includes a discussion on lipid goals, dietary fat, sodium, fiber, plant stanol/sterol esters,  sugar, and the components of a well-balanced, healthy diet.   CARDIAC REHAB PHASE II EXERCISE from 08/10/2017 in [place]  Date  08/04/17  Educator  RD  Instruction Review Code  2- meets goals/outcomes      Nutrition II class: Lifestyle Skills:  -Group instruction provided by PowerPoint slides, verbal discussion, and written materials to support subject matter. The instructor gives an explanation and review of label reading, grocery shopping for heart health, heart healthy recipe modifications, and ways to make healthier choices when eating out.   CARDIAC REHAB PHASE II EXERCISE from 08/10/2017 in [place]  Date  08/11/17  Educator  RD  Instruction Review Code  2- meets goals/outcomes      Diabetes Question & Answer:  -Group instruction provided by PowerPoint slides, verbal discussion, and written materials to support subject matter. The instructor gives an explanation and review of diabetes co-morbidities, pre- and post-prandial blood glucose goals, pre-exercise blood glucose goals, signs, symptoms, and treatment of hypoglycemia and hyperglycemia, and foot care basics.   Diabetes Blitz:  -Group instruction provided by PowerPoint slides, verbal discussion, and written materials to support subject matter. The instructor gives an explanation and review of the physiology behind type 1 and type 2 diabetes, diabetes medications and rational behind using different medications, pre- and post-prandial blood glucose recommendations and Hemoglobin A1c goals, diabetes diet, and exercise including blood glucose guidelines for exercising safely.    Portion Distortion:  -Group instruction provided by PowerPoint slides, verbal discussion, written materials, and food models to support subject matter. The instructor gives an explanation of serving size versus portion size, changes in portions sizes over the last 20 years, and what consists of  a serving from each food group.   Stress Management:  -Group instruction provided by verbal instruction, video, and written materials to support subject matter.  Instructors review role of stress in heart disease and how to cope with stress positively.     Exercising on Your Own:  -Group instruction provided by verbal instruction, power point, and written materials to support subject.  Instructors discuss benefits of exercise, components of exercise, frequency and intensity of exercise, and end points for exercise.  Also discuss use of nitroglycerin and activating EMS.  Review options of places to exercise outside of rehab.  Review guidelines for sex with heart disease.   Cardiac Drugs I:  -Group instruction provided by verbal instruction and written materials to support subject.  Instructor reviews cardiac drug classes: antiplatelets, anticoagulants, beta blockers, and statins.  Instructor discusses reasons, side effects, and lifestyle considerations for each drug class.   Cardiac Drugs II:  -Group instruction provided by verbal instruction and written materials to support subject.  Instructor reviews cardiac drug classes: angiotensin converting enzyme inhibitors (ACE-I), angiotensin II receptor blockers (ARBs), nitrates, and calcium channel blockers.  Instructor discusses reasons, side effects, and lifestyle considerations for each drug class.   Anatomy and Physiology of the Circulatory System:  Group verbal and written instruction and models provide basic cardiac anatomy and physiology, with the coronary electrical and arterial systems. Review of: AMI, Angina, Valve disease, Heart Failure, Peripheral Artery Disease, Cardiac Arrhythmia, Pacemakers, and the ICD.   Other Education:  -Group or individual verbal, written, or video instructions that support the educational goals of the cardiac rehab program.  Knowledge Questionnaire Score:     Knowledge Questionnaire Score - 07/09/17 1406       Knowledge Questionnaire Score   Pre Score 24/24      Core Components/Risk Factors/Patient Goals at Admission:     Personal Goals and Risk Factors at Admission - 07/09/17 1407      Core Components/Risk Factors/Patient Goals on Admission    Weight Management Yes;Obesity   Intervention Weight Management: Develop a combined nutrition and exercise program designed to reach desired caloric intake, while maintaining appropriate intake of nutrient and fiber, sodium and fats, and appropriate energy expenditure required for the weight goal.   Expected Outcomes Short Term: Continue to assess and modify interventions until short term weight is achieved   Hypertension Yes   Intervention Provide education on lifestyle modifcations including regular physical activity/exercise, weight management, moderate sodium restriction and increased consumption of fresh fruit, vegetables, and low fat dairy, alcohol moderation, and smoking cessation.;Monitor prescription use compliance.   Expected Outcomes Long Term: Maintenance of blood pressure at goal levels.;Short Term: Continued assessment and intervention until BP is < 140/71mm HG in hypertensive participants. < 130/56mm HG in hypertensive participants with diabetes, heart failure or chronic kidney disease.   Lipids Yes   Intervention Provide education and support for participant on nutrition & aerobic/resistive exercise along with prescribed medications to achieve LDL 70mg , HDL >40mg .   Expected Outcomes Short Term: Participant states understanding of desired cholesterol values and is compliant with medications prescribed. Participant is following exercise prescription and nutrition guidelines.;Long Term: Cholesterol controlled with medications as prescribed, with individualized exercise RX and with personalized nutrition plan. Value goals: LDL < 70mg , HDL > 40 mg.      Core Components/Risk Factors/Patient Goals Review:      Goals and Risk Factor Review     Row Name 08/12/17 1753 09/09/17 1224           Core Components/Risk Factors/Patient Goals Review   Personal Goals Review Weight Management/Obesity;Lipids;Hypertension Weight Management/Obesity;Lipids;Hypertension      Review Joshua [name] is doing well with exercise. Joshua [name]'s vital signs have been stable Joshua [name] is doing well with exercise. Joshua [name]'s vital signs have been stable      Expected Outcomes Joshua [name] will continue to exercise, monitor blood pressure and take medications as presribed including cholesterol medications Joshua [name] will continue to exercise, monitor blood pressure and take medications as presribed including cholesterol medications         Core Components/Risk Factors/Patient Goals at Discharge (Final Review):      Goals and Risk Factor Review - 09/09/17 1224      Core Components/Risk Factors/Patient Goals Review   Personal Goals Review Weight Management/Obesity;Lipids;Hypertension   Review Joshua [name] is doing well with exercise. Joshua [name]'s vital signs have been stable   Expected Outcomes Joshua [name] will continue to exercise, monitor blood pressure and take medications as presribed including cholesterol medications      ITP Comments:     ITP Comments    Row Name 07/09/17 0947           ITP Comments Dr. Fransico Him, Medical Director          Comments: Joshua [name] is making expected progress toward personal goals after completing 26 sessions. Recommend continued exercise and life style modification education including  stress management and relaxation techniques to decrease cardiac risk profile. Joshua [name] continues to do well with exercise.Barnet Pall, RN,BSN 09/09/2017 12:28 PM

## 2017-09-11 ENCOUNTER — Encounter (HOSPITAL_COMMUNITY)
Admission: RE | Admit: 2017-09-11 | Discharge: 2017-09-11 | Disposition: A | Discharge status: Home / Self Care | Payer: PPO | Source: Ambulatory Visit | Attending: Interventional Cardiology | Admitting: Interventional Cardiology

## 2017-09-11 ENCOUNTER — Encounter (HOSPITAL_COMMUNITY): Payer: PPO

## 2017-09-11 DIAGNOSIS — Z955 Presence of coronary angioplasty implant and graft: Secondary | ICD-10-CM

## 2017-09-11 DIAGNOSIS — I214 Non-ST elevation (NSTEMI) myocardial infarction: Secondary | ICD-10-CM | POA: Diagnosis not present

## 2017-09-14 ENCOUNTER — Encounter (HOSPITAL_COMMUNITY): Payer: PPO

## 2017-09-14 ENCOUNTER — Encounter (HOSPITAL_COMMUNITY)
Admission: RE | Admit: 2017-09-14 | Discharge: 2017-09-14 | Disposition: A | Discharge status: Home / Self Care | Payer: PPO | Source: Ambulatory Visit | Attending: Interventional Cardiology | Admitting: Interventional Cardiology

## 2017-09-14 DIAGNOSIS — Z955 Presence of coronary angioplasty implant and graft: Secondary | ICD-10-CM

## 2017-09-14 DIAGNOSIS — I214 Non-ST elevation (NSTEMI) myocardial infarction: Secondary | ICD-10-CM | POA: Diagnosis not present

## 2017-09-16 ENCOUNTER — Encounter (HOSPITAL_COMMUNITY)
Admission: RE | Admit: 2017-09-16 | Discharge: 2017-09-16 | Disposition: A | Discharge status: Home / Self Care | Payer: PPO | Source: Ambulatory Visit | Attending: Interventional Cardiology | Admitting: Interventional Cardiology

## 2017-09-16 ENCOUNTER — Encounter (HOSPITAL_COMMUNITY): Payer: PPO

## 2017-09-16 ENCOUNTER — Encounter: Payer: Self-pay | Admitting: Nurse Practitioner

## 2017-09-16 DIAGNOSIS — Z955 Presence of coronary angioplasty implant and graft: Secondary | ICD-10-CM

## 2017-09-16 DIAGNOSIS — I214 Non-ST elevation (NSTEMI) myocardial infarction: Secondary | ICD-10-CM | POA: Diagnosis not present

## 2017-09-18 ENCOUNTER — Encounter (HOSPITAL_COMMUNITY): Payer: PPO

## 2017-09-18 ENCOUNTER — Encounter (HOSPITAL_COMMUNITY)
Admission: RE | Admit: 2017-09-18 | Discharge: 2017-09-18 | Disposition: A | Discharge status: Home / Self Care | Payer: PPO | Source: Ambulatory Visit | Attending: Interventional Cardiology | Admitting: Interventional Cardiology

## 2017-09-18 DIAGNOSIS — Z955 Presence of coronary angioplasty implant and graft: Secondary | ICD-10-CM

## 2017-09-18 DIAGNOSIS — I214 Non-ST elevation (NSTEMI) myocardial infarction: Secondary | ICD-10-CM | POA: Diagnosis not present

## 2017-09-21 ENCOUNTER — Encounter (HOSPITAL_COMMUNITY): Payer: PPO

## 2017-09-21 ENCOUNTER — Encounter (HOSPITAL_COMMUNITY)
Admission: RE | Admit: 2017-09-21 | Discharge: 2017-09-21 | Disposition: A | Discharge status: Home / Self Care | Payer: PPO | Source: Ambulatory Visit | Attending: Interventional Cardiology | Admitting: Interventional Cardiology

## 2017-09-21 DIAGNOSIS — I214 Non-ST elevation (NSTEMI) myocardial infarction: Secondary | ICD-10-CM | POA: Diagnosis not present

## 2017-09-21 DIAGNOSIS — Z955 Presence of coronary angioplasty implant and graft: Secondary | ICD-10-CM

## 2017-09-22 ENCOUNTER — Encounter: Payer: Self-pay | Admitting: Family Medicine

## 2017-09-23 ENCOUNTER — Encounter (HOSPITAL_COMMUNITY)
Admission: RE | Admit: 2017-09-23 | Discharge: 2017-09-23 | Disposition: A | Discharge status: Home / Self Care | Payer: PPO | Source: Ambulatory Visit | Attending: Interventional Cardiology | Admitting: Interventional Cardiology

## 2017-09-23 ENCOUNTER — Encounter (HOSPITAL_COMMUNITY): Payer: PPO

## 2017-09-23 ENCOUNTER — Other Ambulatory Visit: Payer: Self-pay | Admitting: Family Medicine

## 2017-09-23 DIAGNOSIS — Z955 Presence of coronary angioplasty implant and graft: Secondary | ICD-10-CM

## 2017-09-23 DIAGNOSIS — I214 Non-ST elevation (NSTEMI) myocardial infarction: Secondary | ICD-10-CM

## 2017-09-23 MED ORDER — GABAPENTIN 100 MG PO CAPS: ORAL_CAPSULE | ORAL | 3 refills | Status: DC

## 2017-09-25 ENCOUNTER — Encounter (HOSPITAL_COMMUNITY): Payer: PPO

## 2017-09-25 ENCOUNTER — Encounter (HOSPITAL_COMMUNITY)
Admission: RE | Admit: 2017-09-25 | Discharge: 2017-09-25 | Disposition: A | Discharge status: Home / Self Care | Payer: PPO | Source: Ambulatory Visit | Attending: Interventional Cardiology | Admitting: Interventional Cardiology

## 2017-09-25 DIAGNOSIS — Z955 Presence of coronary angioplasty implant and graft: Secondary | ICD-10-CM

## 2017-09-25 DIAGNOSIS — I214 Non-ST elevation (NSTEMI) myocardial infarction: Secondary | ICD-10-CM

## 2017-09-28 ENCOUNTER — Encounter (HOSPITAL_COMMUNITY): Payer: PPO

## 2017-09-28 ENCOUNTER — Encounter (HOSPITAL_COMMUNITY)
Admission: RE | Admit: 2017-09-28 | Discharge: 2017-09-28 | Disposition: A | Discharge status: Home / Self Care | Payer: PPO | Source: Ambulatory Visit | Attending: Interventional Cardiology | Admitting: Interventional Cardiology

## 2017-09-28 DIAGNOSIS — I214 Non-ST elevation (NSTEMI) myocardial infarction: Secondary | ICD-10-CM | POA: Diagnosis not present

## 2017-09-28 DIAGNOSIS — Z955 Presence of coronary angioplasty implant and graft: Secondary | ICD-10-CM

## 2017-09-29 ENCOUNTER — Encounter: Payer: Self-pay | Admitting: Nurse Practitioner

## 2017-09-29 ENCOUNTER — Ambulatory Visit (INDEPENDENT_AMBULATORY_CARE_PROVIDER_SITE_OTHER): Payer: PPO | Admitting: Nurse Practitioner

## 2017-09-29 VITALS — BP 108/70 | HR 58 | Ht 66.75 in | Wt 215.8 lb

## 2017-09-29 DIAGNOSIS — I10 Essential (primary) hypertension: Secondary | ICD-10-CM

## 2017-09-29 DIAGNOSIS — Z955 Presence of coronary angioplasty implant and graft: Secondary | ICD-10-CM | POA: Diagnosis not present

## 2017-09-29 DIAGNOSIS — E7849 Other hyperlipidemia: Secondary | ICD-10-CM

## 2017-09-29 DIAGNOSIS — I259 Chronic ischemic heart disease, unspecified: Secondary | ICD-10-CM

## 2017-09-29 DIAGNOSIS — I214 Non-ST elevation (NSTEMI) myocardial infarction: Secondary | ICD-10-CM

## 2017-09-29 NOTE — Patient Instructions (Addendum)
We will be checking the following labs today - NONE  Fasting labs next week - BMET, CBC, HPF and Lipids   Medication Instructions:    Continue with your current medicines.     Testing/Procedures To Be Arranged:  N/A  Follow-Up:   See Dr. Tamala Julian in about 4 months    Other Special Instructions:   Keep up the good work!!    If you need a refill on your cardiac medications before your next appointment, please call your pharmacy.   Call the [place] office at [phone] if you have any questions, problems or concerns.

## 2017-09-29 NOTE — Progress Notes (Signed)
CARDIOLOGY OFFICE NOTE  Date:  09/29/2017    Joshua [name]. Date of Birth: 1941/03/21 Medical Record #[id number]  PCP:  Dickie La, MD  Cardiologist:  Jennings Books  Chief Complaint  Patient presents with  . Coronary Artery Disease    3 month check - seen for Dr. Tamala Julian    History of Present Illness: Joshua T Manuel Dall. is a 76 y.o. male who presents today for a 3 month check. Seen for Dr. Tamala Julian.   He has a long-standing history of essential hypertension, coronary artery disease with remote bypass grafting in 1990 with right internal mammary to LAD, left internal mammary to circumflex, SVG to diagonal, and SVG to RCA; bare-metal stent SVG to RCA 2011, hyperlipidemia, diabetes mellitus, and peripheral neuropathy.  Last seen in March by Dr. Tamala Julian - he was felt to be doing well. Some DOE and had an echo showing normal LV function at that time.   Called here back in July with chest pain - referred on to the hospital and was admitted - troponins peaked at 0.19 - got cathed - had ISR of the proximal/osital RCA graft with successful PCI/DES to both areas and distal protection using 4.0 spider by Dr. Tamala Julian. Normal LV function noted. Plan for DAPT with ASA/Brilinta for at least one year. I then saw him for his post hospital visit - he was doing ok.  Apparently allergic to Plavix (? If this was on generic ?filler). But doing ok on Brilinta. He seemed motivated to work on CV risk factor modification.   Comes in today. Here with his wife - Joshua [name] (she used to work at [employer]). He is doing well. No chest pain. Breathing is good. Has enjoyed cardiac rehab and will be graduating this week. Not fasting today. No recent lipids. Feels ok on his medicines. They are both happy with how he is doing and have no real concerns today.   Past Medical History:  Diagnosis Date  . Anginal pain ([place])   . Coronary artery disease involving native coronary artery of native heart with angina pectoris  ([place])    a. 1990 s/p CABG x 4 (RIMA->LAD, LIMA->LCX, VG->Diag, VG->RCA); b. 2011 s/p BMS to VG->RCA.; c. Unstable Angina 05/2017: Patent RIMA-LAD & LIMA-LCx, ostSVG-RCA 65% ISR & mSVG-RCA  99% - DES PCI to both  . Dyspnea on exertion    a. 01/2017 Echo: EF 55-60%, no rwma, mild MR, midlly dil LA, PASP 13mmHg.  Marland Kitchen GERD (gastroesophageal reflux disease)   . History of hiatal hernia   . Hyperlipidemia   . Hypertension   . Obesity     Past Surgical History:  Procedure Laterality Date  . CATARACT EXTRACTION W/ INTRAOCULAR LENS  IMPLANT, BILATERAL Bilateral   . CORONARY ANGIOPLASTY WITH STENT PLACEMENT  2011   "RCA"  . CORONARY ARTERY BYPASS GRAFT  1990   CABG X4  . CORONARY STENT INTERVENTION N/A 06/24/2017   Procedure: Coronary Stent Intervention;  Surgeon: Belva Crome, MD;  Location: [facility];  Service: Cardiovascular: Ostial 4.0 x 12 mm Onyx DES reducing 70% stenosis to less than 40%. Distal body 3.5 x 15 Onyx DES reducing 99% stenosis to less than 30%.  Marland Kitchen LEFT HEART CATH AND CORS/GRAFTS ANGIOGRAPHY N/A 06/24/2017   Procedure: Left Heart Cath and Cors/Grafts Angiography;  Surgeon: Belva Crome, MD;  Location: [facility];  Service: Cardiovascular:  CTO native RCA, LAD & LCx (Graft Dependent). Patent LIMA-LCx &  RIMA-LAD.  SVG-RCA: ost 65% ISR & mid 99% --> DES PCI to both     Medications: Current Meds  Medication Sig  . amitriptyline (ELAVIL) 25 MG tablet Take 25 mg by mouth at bedtime.  Marland Kitchen aspirin EC 81 MG EC tablet Take 1 tablet (81 mg total) by mouth daily.  Marland Kitchen atenolol-chlorthalidone (TENORETIC) 100-25 MG tablet Take 1 tablet by mouth daily.  . cetirizine (ZYRTEC) 10 MG tablet Take 10 mg by mouth daily.  . citalopram (CELEXA) 20 MG tablet Take 1 tablet (20 mg total) by mouth daily.  Marland Kitchen esomeprazole (NEXIUM) 20 MG capsule Take 20 mg by mouth daily.  Marland Kitchen ezetimibe (ZETIA) 10 MG tablet Take 10 mg by mouth daily.  . Multiple Vitamins-Minerals (CENTRUM SILVER 50+MEN) TABS  Take 1 tablet by mouth daily.  . nitroGLYCERIN (NITROSTAT) 0.4 MG SL tablet Place 1 tablet (0.4 mg total) under the tongue every 5 (five) minutes x 3 doses as needed for chest pain.  . simvastatin (ZOCOR) 40 MG tablet Take 40 mg by mouth daily.  . ticagrelor (BRILINTA) 90 MG TABS tablet Take 1 tablet (90 mg total) by mouth 2 (two) times daily.  . vitamin B-12 (CYANOCOBALAMIN) 1000 MCG tablet Take 1,000 mcg by mouth daily.  . [DISCONTINUED] gabapentin (NEURONTIN) 100 MG capsule Take one by mouth 4 times a day     Allergies: Allergies  Allergen Reactions  . Ace Inhibitors Cough  . Atorvastatin Other (See Comments)    Muscle pain  . Rosuvastatin Other (See Comments)    Muscle pain  . Plavix [Clopidogrel Bisulfate] Itching and Rash  . Sulfa Antibiotics Itching and Rash  . Sulfonamide Derivatives Itching and Rash    Social History: The patient  reports that he has never smoked. He quit smokeless tobacco use about 18 years ago. His smokeless tobacco use included Chew. He reports that he drinks alcohol. He reports that he does not use drugs.   Family History: The patient's family history includes Cancer in his mother; Coronary artery disease in his father; Heart attack in his father; Hyperlipidemia in his son; Hypertension in his son.   Review of Systems: Please see the history of present illness.   Otherwise, the review of systems is positive for none.   All other systems are reviewed and negative.   Physical Exam: VS:  BP 108/70   Pulse (!) 58   Ht 5' 6.75" (1.695 m)   Wt 215 lb 12.8 oz (97.9 kg)   SpO2 98%   BMI 34.05 kg/m  .  BMI Body mass index is 34.05 kg/m.  Wt Readings from Last 3 Encounters:  09/29/17 215 lb 12.8 oz (97.9 kg)  07/09/17 221 lb 1.9 oz (100.3 kg)  07/08/17 221 lb (100.2 kg)    General: Pleasant. Obese. Alert and in no acute distress. He has lost 9 pounds since last visit here.   HEENT: Normal.  Neck: Supple, no JVD, carotid bruits, or masses noted.    Cardiac: Regular rate and rhythm. No murmurs, rubs, or gallops. No edema.  Respiratory:  Lungs are clear to auscultation bilaterally with normal work of breathing.  GI: Soft and nontender.  MS: No deformity or atrophy. Gait and ROM intact.  Skin: Warm and dry. Color is normal.  Neuro:  Strength and sensation are intact and no gross focal deficits noted.  Psych: Alert, appropriate and with normal affect.   LABORATORY DATA:  EKG:  EKG is not ordered today.  Lab Results  Component Value Date  WBC 7.6 06/29/2017   HGB 13.9 06/29/2017   HCT 41.2 06/29/2017   PLT 171 06/29/2017   GLUCOSE 144 (H) 06/29/2017   CHOL 139 12/14/2014   TRIG 231 (H) 12/14/2014   HDL 25 (L) 12/14/2014   LDLDIRECT 84 05/14/2016   LDLCALC 68 12/14/2014   ALT 42 07/16/2016   AST 54 (H) 07/16/2016   NA 135 06/29/2017   K 3.6 06/29/2017   CL 94 (L) 06/29/2017   CREATININE 1.05 06/29/2017   BUN 15 06/29/2017   CO2 22 06/29/2017   TSH 3.15 07/16/2016   PSA 1.20 05/14/2016   INR 1.03 06/23/2017     BNP (last 3 results) No results for input(s): BNP in the last 8760 hours.  ProBNP (last 3 results) No results for input(s): PROBNP in the last 8760 hours.   Other Studies Reviewed Today:   LHC: 06/24/17  Conclusion    Acute coronary syndrome/non-ST elevation myocardial infarction due to high-grade obstruction in the saphenous vein graft to the right coronary. In-stent restenosis in the proximal/ostial RCA graft with 60-70% narrowing. 99% distal graft obstruction. The graft is heavily calcified and is 74 years old.  Total occlusion of saphenous vein graft to the diagonal  Patent right internal mammary graft to the LAD  Patent left internal mammary graft to the obtuse marginal  Successful 2 site intervention on the SVG to the right coronary with distal protection using 4.0 spider.  Ostial 4.0 x 12 mm Onyx DES reducing 70% stenosis to less than 40%. Distal body 3.5 x 15 Onyx DES reducing 99%  stenosis to less than 30%.  Normal LV function with normal filling pressures.  RECOMMENDATIONS:  Brilinta and aspirin for at least a year  Risk factor modification  Potential discharge in a.m.  Angio-Seal access closure without complications.    Echo Study Conclusions from 01/2017  - Procedure narrative: Transthoracic echocardiography. Image quality was suboptimal. The study was technically difficult. Intravenous contrast (Definity) was administered. - Left ventricle: The cavity size was normal. Systolic function was normal. The estimated ejection fraction was in the range of 55% to 60%. Wall motion was normal; there were no regional wall motion abnormalities. Left ventricular diastolic function parameters were normal. - Mitral valve: There was mild regurgitation. - Left atrium: The atrium was mildly dilated. - Atrial septum: No defect or patent foramen ovale was identified. - Pulmonary arteries: PA peak pressure: 43 mm Hg (S).   Assessment/Plan:  1. Prior NSTEMI with PCI with successful 2 site intervention on the SVG to the right coronary with distal protection from July of 2018. He continues to do well. No symptoms. Continue with current regimen and CV risk factor modification.   2. Known CAD with remote CABG - see #1  3. HTN - BP looks good here today - no changes made today.   4. HLD - on statin - do not see recent lipids - he is not fasting today - will do next week.   5. Obesity - actively losing weight. Encouraged to continue. He has a plan to start using his stationary bike once he graduates from cardiac rehab.   Current medicines are reviewed with the patient today.  The patient does not have concerns regarding medicines other than what has been noted above.  The following changes have been made:  See above.  Labs/ tests ordered today include:    Orders Placed This Encounter  Procedures  . Basic metabolic panel  . CBC  . Hepatic  function  panel  . Lipid panel     Disposition:   FU with Dr. Tamala Julian in 4 months.   Patient is agreeable to this plan and will call if any problems develop in the interim.   SignedTruitt Merle, NP  09/29/2017 8:50 AM  [facility] [address] [place], [place]  [address] Phone: [phone] Fax: [phone]

## 2017-09-30 ENCOUNTER — Encounter (HOSPITAL_COMMUNITY): Payer: PPO

## 2017-09-30 ENCOUNTER — Encounter (HOSPITAL_COMMUNITY)
Admission: RE | Admit: 2017-09-30 | Discharge: 2017-09-30 | Disposition: A | Discharge status: Home / Self Care | Payer: PPO | Source: Ambulatory Visit | Attending: Interventional Cardiology | Admitting: Interventional Cardiology

## 2017-09-30 DIAGNOSIS — Z955 Presence of coronary angioplasty implant and graft: Secondary | ICD-10-CM

## 2017-09-30 DIAGNOSIS — I214 Non-ST elevation (NSTEMI) myocardial infarction: Secondary | ICD-10-CM

## 2017-10-02 ENCOUNTER — Encounter (HOSPITAL_COMMUNITY): Payer: PPO

## 2017-10-02 ENCOUNTER — Encounter (HOSPITAL_COMMUNITY)
Admission: RE | Admit: 2017-10-02 | Discharge: 2017-10-02 | Disposition: A | Discharge status: Home / Self Care | Payer: PPO | Source: Ambulatory Visit | Attending: Interventional Cardiology | Admitting: Interventional Cardiology

## 2017-10-02 VITALS — BP 110/70 | HR 60 | Ht 66.75 in | Wt 215.2 lb

## 2017-10-02 DIAGNOSIS — K219 Gastro-esophageal reflux disease without esophagitis: Secondary | ICD-10-CM | POA: Diagnosis not present

## 2017-10-02 DIAGNOSIS — I10 Essential (primary) hypertension: Secondary | ICD-10-CM | POA: Insufficient documentation

## 2017-10-02 DIAGNOSIS — Z87891 Personal history of nicotine dependence: Secondary | ICD-10-CM | POA: Insufficient documentation

## 2017-10-02 DIAGNOSIS — Z6834 Body mass index (BMI) 34.0-34.9, adult: Secondary | ICD-10-CM | POA: Diagnosis not present

## 2017-10-02 DIAGNOSIS — Z955 Presence of coronary angioplasty implant and graft: Secondary | ICD-10-CM | POA: Insufficient documentation

## 2017-10-02 DIAGNOSIS — I2510 Atherosclerotic heart disease of native coronary artery without angina pectoris: Secondary | ICD-10-CM | POA: Insufficient documentation

## 2017-10-02 DIAGNOSIS — Z79899 Other long term (current) drug therapy: Secondary | ICD-10-CM | POA: Diagnosis not present

## 2017-10-02 DIAGNOSIS — E669 Obesity, unspecified: Secondary | ICD-10-CM | POA: Diagnosis not present

## 2017-10-02 DIAGNOSIS — E785 Hyperlipidemia, unspecified: Secondary | ICD-10-CM | POA: Diagnosis not present

## 2017-10-02 DIAGNOSIS — Z951 Presence of aortocoronary bypass graft: Secondary | ICD-10-CM | POA: Insufficient documentation

## 2017-10-02 DIAGNOSIS — Z7982 Long term (current) use of aspirin: Secondary | ICD-10-CM | POA: Insufficient documentation

## 2017-10-02 DIAGNOSIS — I214 Non-ST elevation (NSTEMI) myocardial infarction: Secondary | ICD-10-CM

## 2017-10-02 NOTE — Progress Notes (Signed)
Discharge Progress Report  Patient Details  Name: Joshua [name]. MRN: [id number] Date of Birth: February 02, 1941 Referring Provider:     CARDIAC REHAB PHASE II ORIENTATION from 07/09/2017 in [facility]  Referring Provider  Daneen Schick MD       Number of Visits: 36  Reason for Discharge:  Patient reached a stable level of exercise.  Smoking History:  Social History   Tobacco Use  Smoking Status Never Smoker  Smokeless Tobacco Former Systems developer  . Types: Chew    Diagnosis:  NSTEMI (non-ST elevated myocardial infarction) ([place]) 06/23/2017  Status post coronary artery stent placement 06/24/2017  ADL UCSD:   Initial Exercise Prescription: Initial Exercise Prescription - 07/09/17 1300      Date of Initial Exercise RX and Referring Provider   Date  07/09/17    Referring Provider  Daneen Schick MD      Bike   Level  0.7    Minutes  10    METs  2.5      NuStep   Level  2    SPM  85    Minutes  10    METs  2      Track   Laps  8    Minutes  10    METs  2.4      Prescription Details   Frequency (times per week)  3    Duration  Progress to 45 minutes of aerobic exercise without signs/symptoms of physical distress      Intensity   THRR 40-80% of Max Heartrate  59-118    Ratings of Perceived Exertion  11-13    Perceived Dyspnea  0-4      Progression   Progression  Continue to progress workloads to maintain intensity without signs/symptoms of physical distress.      Resistance Training   Training Prescription  Yes    Weight  2    Reps  10-15       Discharge Exercise Prescription (Final Exercise Prescription Changes): Exercise Prescription Changes - 09/21/17 1009      Response to Exercise   Blood Pressure (Admit)  118/60    Blood Pressure (Exercise)  152/50    Blood Pressure (Exit)  114/66    Heart Rate (Admit)  58 bpm    Heart Rate (Exercise)  102 bpm    Heart Rate (Exit)  58 bpm    Rating of Perceived Exertion (Exercise)  12     Symptoms  none    Duration  Progress to 30 minutes of  aerobic without signs/symptoms of physical distress    Intensity  THRR unchanged      Progression   Progression  Continue to progress workloads to maintain intensity without signs/symptoms of physical distress.    Average METs  3.9      Resistance Training   Training Prescription  Yes    Weight  5    Reps  10-15    Time  10 Minutes      Interval Training   Interval Training  No      Bike   Level  1.6    Minutes  10    METs  4.12      NuStep   Level  5    SPM  85    Minutes  10    METs  4.2      Track   Laps  14    Minutes  10  METs  3.43      Home Exercise Plan   Plans to continue exercise at  Home (comment) Pt has stationary bike at home.    Frequency  Add 3 additional days to program exercise sessions.    Initial Home Exercises Provided  05/05/17       Functional Capacity: 6 Minute Walk    Row Name 07/09/17 1348 09/21/17 1009       6 Minute Walk   Phase  Initial  Discharge    Distance  1311 feet  1600 feet    Distance % Change  -  22.04 %    Walk Time  6 minutes  6 minutes    # of Rest Breaks  0  0    MPH  2.5  3.03    METS  2.3  3.07    RPE  9  11    VO2 Peak  8.1  10.73    Symptoms  No  No    Resting HR  62 bpm  58 bpm    Resting BP  128/80  118/60    Max Ex. HR  84 bpm  102 bpm    Max Ex. BP  154/80  152/82    2 Minute Post BP  130/77  -       Psychological, QOL, Others - Outcomes: PHQ 2/9: Depression screen [facility] 2/9 10/02/2017 10/02/2017 07/13/2017 07/08/2017 02/18/2017  Decreased Interest 0 0 0 0 0  Down, Depressed, Hopeless 0 0 0 0 0  PHQ - 2 Score 0 0 0 0 0    Quality of Life: Quality of Life - 09/25/17 1115      Quality of Life Scores   Health/Function Pre  24.23 %    Health/Function Post  24.57 %    Health/Function % Change  1.4 %    Socioeconomic Pre  25.36 %    Socioeconomic Post  21.1 %    Socioeconomic % Change   -16.8 %    Psych/Spiritual Pre  28.07 %     Psych/Spiritual Post  27.21 %    Psych/Spiritual % Change  -3.06 %    Family Pre  28.8 %    Family Post  26.4 %    Family % Change  -8.33 %    GLOBAL Pre  25.93 %    GLOBAL Post  24.89 %    GLOBAL % Change  -4.01 %       Personal Goals: Goals established at orientation with interventions provided to work toward goal. Personal Goals and Risk Factors at Admission - 07/09/17 1407      Core Components/Risk Factors/Patient Goals on Admission    Weight Management  Yes;Obesity    Intervention  Weight Management: Develop a combined nutrition and exercise program designed to reach desired caloric intake, while maintaining appropriate intake of nutrient and fiber, sodium and fats, and appropriate energy expenditure required for the weight goal.    Expected Outcomes  Short Term: Continue to assess and modify interventions until short term weight is achieved    Hypertension  Yes    Intervention  Provide education on lifestyle modifcations including regular physical activity/exercise, weight management, moderate sodium restriction and increased consumption of fresh fruit, vegetables, and low fat dairy, alcohol moderation, and smoking cessation.;Monitor prescription use compliance.    Expected Outcomes  Long Term: Maintenance of blood pressure at goal levels.;Short Term: Continued assessment and intervention until BP is < 140/49m HG in hypertensive participants. < 130/863m  HG in hypertensive participants with diabetes, heart failure or chronic kidney disease.    Lipids  Yes    Intervention  Provide education and support for participant on nutrition & aerobic/resistive exercise along with prescribed medications to achieve LDL <73m, HDL >425m    Expected Outcomes  Short Term: Participant states understanding of desired cholesterol values and is compliant with medications prescribed. Participant is following exercise prescription and nutrition guidelines.;Long Term: Cholesterol controlled with medications  as prescribed, with individualized exercise RX and with personalized nutrition plan. Value goals: LDL < 7012mHDL > 40 mg.        Personal Goals Discharge: Goals and Risk Factor Review    Row Name 08/12/17 1753 09/09/17 1224 10/14/17 1012         Core Components/Risk Factors/Patient Goals Review   Personal Goals Review  Weight Management/Obesity;Lipids;Hypertension  Weight Management/Obesity;Lipids;Hypertension  Weight Management/Obesity     Review  Skip is doing well with exercise. Skip's vital signs have been stable  Skip is doing well with exercise. Skip's vital signs have been stable  Pt has lost ~ 6 lb. Wt loss minimum goal met. See Nutrition section.      Expected Outcomes  Skip will continue to exercise, monitor blood pressure and take medications as presribed including cholesterol medications  Skip will continue to exercise, monitor blood pressure and take medications as presribed including cholesterol medications  -        Exercise Goals and Review: Exercise Goals    Row Name 07/09/17 0948             Exercise Goals   Increase Physical Activity  Yes       Intervention  Provide advice, education, support and counseling about physical activity/exercise needs.;Develop an individualized exercise prescription for aerobic and resistive training based on initial evaluation findings, risk stratification, comorbidities and participant's personal goals.       Expected Outcomes  Achievement of increased cardiorespiratory fitness and enhanced flexibility, muscular endurance and strength shown through measurements of functional capacity and personal statement of participant.       Increase Strength and Stamina  Yes       Intervention  Provide advice, education, support and counseling about physical activity/exercise needs.;Develop an individualized exercise prescription for aerobic and resistive training based on initial evaluation findings, risk stratification, comorbidities and  participant's personal goals.       Expected Outcomes  Achievement of increased cardiorespiratory fitness and enhanced flexibility, muscular endurance and strength shown through measurements of functional capacity and personal statement of participant.          Nutrition & Weight - Outcomes: Pre Biometrics - 07/09/17 1610      Pre Biometrics   Height  5' 6.75" (1.695 m)    Weight  221 lb 1.9 oz (100.3 kg)    Waist Circumference  46.25 inches    Hip Circumference  45.5 inches    Waist to Hip Ratio  1.02 %    BMI (Calculated)  35    Triceps Skinfold  10 mm    % Body Fat  31.9 %    Grip Strength  34 kg    Flexibility  9 in    Single Leg Stand  13.2 seconds      Post Biometrics - 10/02/17 0945       Post  Biometrics   Height  5' 6.75" (1.695 m)    Weight  215 lb 2.7 oz (97.6 kg)    Waist Circumference  43.5 inches    Hip Circumference  44.25 inches    Waist to Hip Ratio  0.98 %    BMI (Calculated)  33.97    Triceps Skinfold  9 mm    % Body Fat  29.8 %    Grip Strength  38.5 kg    Flexibility  14 in    Single Leg Stand  17.93 seconds       Nutrition: Nutrition Therapy & Goals - 07/15/17 1022      Nutrition Therapy   Diet  Therapeutic Lifestyle Changes      Personal Nutrition Goals   Nutrition Goal  Pt to identify and limit food sources of saturated fat, trans fat, and sodium    Personal Goal #2  Pt to identify food quantities necessary to achieve weight loss of 6-24 lb (2.7-10.9 kg) at graduation from cardiac rehab.       Intervention Plan   Intervention  Prescribe, educate and counsel regarding individualized specific dietary modifications aiming towards targeted core components such as weight, hypertension, lipid management, diabetes, heart failure and other comorbidities.    Expected Outcomes  Short Term Goal: Understand basic principles of dietary content, such as calories, fat, sodium, cholesterol and nutrients.;Long Term Goal: Adherence to prescribed nutrition  plan.       Nutrition Discharge: Nutrition Assessments - 09/23/17 1405      MEDFICTS Scores   Pre Score  67    Post Score  23    Score Difference  -44       Education Questionnaire Score: Knowledge Questionnaire Score - 09/23/17 1146      Knowledge Questionnaire Score   Pre Score  24/24    Post Score  24/24       Goals reviewed with patient; copy given to patient.Skip graduated from cardiac rehab program today with completion of 36 exercise sessions in Phase II. Pt maintained good attendance and progressed nicely during his participation in rehab as evidenced by increased MET level.   Medication list reconciled. Repeat  PHQ score-0  .  Pt has made significant lifestyle changes and should be commended for his success. Pt feels he has achieved his goals during cardiac rehab.   Pt plans to continue exercise.by using his exercise bike at home and walking. We are proud of Skip's progress and weight loss.Barnet Pall, RN,BSN 10/21/2017 4:22 PM

## 2017-10-05 ENCOUNTER — Encounter (HOSPITAL_COMMUNITY): Payer: PPO

## 2017-10-06 ENCOUNTER — Other Ambulatory Visit: Payer: PPO | Admitting: *Deleted

## 2017-10-06 DIAGNOSIS — I214 Non-ST elevation (NSTEMI) myocardial infarction: Secondary | ICD-10-CM

## 2017-10-06 DIAGNOSIS — I10 Essential (primary) hypertension: Secondary | ICD-10-CM

## 2017-10-06 DIAGNOSIS — I259 Chronic ischemic heart disease, unspecified: Secondary | ICD-10-CM

## 2017-10-06 DIAGNOSIS — E7849 Other hyperlipidemia: Secondary | ICD-10-CM

## 2017-10-06 LAB — LIPID PANEL
Chol/HDL Ratio: 5 ratio (ref 0.0–5.0)
Cholesterol, Total: 136 mg/dL (ref 100–199)
HDL: 27 mg/dL — ABNORMAL LOW (ref >39)
LDL Calculated: 79 mg/dL (ref 0–99)
Triglycerides: 151 mg/dL — ABNORMAL HIGH (ref 0–149)
VLDL Cholesterol Cal: 30 mg/dL (ref 5–40)

## 2017-10-06 LAB — BASIC METABOLIC PANEL
BUN/Creatinine Ratio: 18 (ref 10–24)
BUN: 17 mg/dL (ref 8–27)
CO2: 25 mmol/L (ref 20–29)
Calcium: 9.2 mg/dL (ref 8.6–10.2)
Chloride: 94 mmol/L — ABNORMAL LOW (ref 96–106)
Creatinine, Ser: 0.93 mg/dL (ref 0.76–1.27)
GFR calc Af Amer: 94 mL/min/{1.73_m2} (ref >59)
GFR calc non Af Amer: 81 mL/min/{1.73_m2} (ref >59)
Glucose: 110 mg/dL — ABNORMAL HIGH (ref 65–99)
Potassium: 3.8 mmol/L (ref 3.5–5.2)
Sodium: 136 mmol/L (ref 134–144)

## 2017-10-06 LAB — CBC
Hematocrit: 37.6 % (ref 37.5–51.0)
Hemoglobin: 12.1 g/dL — ABNORMAL LOW (ref 13.0–17.7)
MCH: 27.9 pg (ref 26.6–33.0)
MCHC: 32.2 g/dL (ref 31.5–35.7)
MCV: 87 fL (ref 79–97)
Platelets: 207 10*3/uL (ref 150–379)
RBC: 4.34 x10E6/uL (ref 4.14–5.80)
RDW: 13.7 % (ref 12.3–15.4)
WBC: 6.2 10*3/uL (ref 3.4–10.8)

## 2017-10-06 LAB — HEPATIC FUNCTION PANEL
ALT: 29 IU/L (ref 0–44)
AST: 38 IU/L (ref 0–40)
Albumin: 4 g/dL (ref 3.5–4.8)
Alkaline Phosphatase: 68 IU/L (ref 39–117)
Bilirubin Total: 0.4 mg/dL (ref 0.0–1.2)
Bilirubin, Direct: 0.16 mg/dL (ref 0.00–0.40)
Total Protein: 6.4 g/dL (ref 6.0–8.5)

## 2017-10-07 ENCOUNTER — Encounter (HOSPITAL_COMMUNITY): Payer: PPO

## 2017-10-08 ENCOUNTER — Telehealth: Payer: Self-pay | Admitting: *Deleted

## 2017-10-08 NOTE — Telephone Encounter (Signed)
Patient wife calling requesting refill on 100mg  gabapentin for patient.

## 2017-10-09 ENCOUNTER — Encounter (HOSPITAL_COMMUNITY): Payer: PPO

## 2017-10-12 ENCOUNTER — Encounter (HOSPITAL_COMMUNITY): Payer: PPO

## 2017-10-12 ENCOUNTER — Other Ambulatory Visit: Payer: Self-pay | Admitting: Family Medicine

## 2017-10-12 NOTE — Telephone Encounter (Signed)
Pts wife calling to check status.  100mg  is not on pts med list. [place], Joshua [name], [place]

## 2017-10-13 MED ORDER — GABAPENTIN 100 MG PO CAPS: ORAL_CAPSULE | ORAL | 3 refills | Status: DC

## 2017-10-14 ENCOUNTER — Encounter (HOSPITAL_COMMUNITY): Payer: PPO

## 2017-10-14 ENCOUNTER — Other Ambulatory Visit: Payer: Self-pay | Admitting: Family Medicine

## 2017-10-21 NOTE — Addendum Note (Signed)
Encounter addended by: Magda Kiel, RN on: 10/21/2017 4:29 PM  Actions taken: Pend clinical note, Sign clinical note

## 2017-10-26 ENCOUNTER — Other Ambulatory Visit: Payer: Self-pay | Admitting: Family Medicine

## 2017-10-26 MED ORDER — GABAPENTIN 100 MG PO CAPS: ORAL_CAPSULE | ORAL | 3 refills | Status: DC

## 2017-11-19 ENCOUNTER — Other Ambulatory Visit: Payer: Self-pay | Admitting: Family Medicine

## 2017-12-11 ENCOUNTER — Other Ambulatory Visit: Payer: Self-pay | Admitting: Family Medicine

## 2017-12-22 ENCOUNTER — Telehealth: Payer: Self-pay | Admitting: Interventional Cardiology

## 2017-12-22 NOTE — Telephone Encounter (Signed)
Joshua [name] is calling because when Joshua [name] exerts himself for something as simple as taking the trash out he is having shortness of breath , as well as the aching in his chest . He did have stents placed .  Wants to know what should they do . Please call   Thanks

## 2017-12-22 NOTE — Telephone Encounter (Signed)
Spoke with wife, DPR on file.  Wife states pt has been having issues with DOE for awhile now but seems to be worsening.  Pt gets winded just taking the trash out and it takes about 10 mins for SOB to resolve with rest.  More recently pt has been developing an aching in his chest when using an exercise bike so he stopped using.  Aching would resolve after a minute or two once he stopped.  Denies lightheadedness or dizziness.  Pt does have a BP cuff but they have not checked vitals recently.  Pt not having any issues currently.  Scheduled pt to come in on 1/23 and see Richardson Dopp, PA-C.  Wife verbalized understanding and was in agreement with this plan.

## 2017-12-23 ENCOUNTER — Encounter: Payer: Self-pay | Admitting: Physician Assistant

## 2017-12-23 ENCOUNTER — Ambulatory Visit: Payer: PPO | Admitting: Physician Assistant

## 2017-12-23 VITALS — BP 118/60 | HR 70 | Ht 66.75 in | Wt 223.0 lb

## 2017-12-23 DIAGNOSIS — I25709 Atherosclerosis of coronary artery bypass graft(s), unspecified, with unspecified angina pectoris: Secondary | ICD-10-CM | POA: Diagnosis not present

## 2017-12-23 DIAGNOSIS — I10 Essential (primary) hypertension: Secondary | ICD-10-CM

## 2017-12-23 DIAGNOSIS — E785 Hyperlipidemia, unspecified: Secondary | ICD-10-CM

## 2017-12-23 MED ORDER — ISOSORBIDE MONONITRATE ER 30 MG PO TB24: 15.0000 mg | ORAL_TABLET | Freq: Every day | ORAL | 3 refills | Status: DC

## 2017-12-23 NOTE — Patient Instructions (Addendum)
Medication Instructions:  1. START IMDUR 30 MG TABLET WITH THE DIRECTIONS ON BOTTLE TO READ; TAKE 1/2 TABLET DAILY = 15 MG DAILY  Labwork: TODAY BMET, CBC, PT/INR  Testing/Procedures: Your physician has requested that you have a cardiac catheterization. Cardiac catheterization is used to diagnose and/or treat various heart conditions. Doctors may recommend this procedure for a number of different reasons. The most common reason is to evaluate chest pain. Chest pain can be a symptom of coronary artery disease (CAD), and cardiac catheterization can show whether plaque is narrowing or blocking your heart's arteries. This procedure is also used to evaluate the valves, as well as measure the blood flow and oxygen levels in different parts of your heart. For further information please visit [email or web address]. Please follow instruction sheet, as given.    Follow-Up: Richardson Dopp, [facility] 01/12/18 @ 10:45   Any Other Special Instructions Will Be Listed Below (If Applicable). If you need a refill on your cardiac medications before your next appointment, please call your pharmacy.    [facility] [address], [place] [address] [place] [address] Dept: [phone] Loc: [place]  12/23/2017  You are scheduled for a Cardiac Catheterization on Wednesday, January 30 with Dr. Daneen Schick at 11:30 AM.   1. Please arrive at the [facility] (Main Entrance A) at [facility]: [place], [place] [address] at 9:00 AM (two hours before your procedure to ensure your preparation). Free valet parking service is available.   Special note: Every effort is made to have your procedure done on time. Please understand that emergencies sometimes delay scheduled procedures.  2. Diet: Do not eat or drink anything after midnight prior to your procedure except sips of water to take  medications.  3. Labs: You will need to have blood drawn on Wednesday, January 23 at [facility]. [address]  Open: 7:30am - 5pm    Phone: [phone]. You do not need to be fasting.  4. Medication instructions in preparation for your procedure: On the morning of your procedure, take your Aspirin 81 mg. You may use very small sips of water.  5. Plan for one night stay--bring personal belongings. 6. Bring a current list of your medications and current insurance cards. 7. You MUST have a responsible person to drive you home. 8. Someone MUST be with you the first 24 hours after you arrive home or your discharge will be delayed. 9. Please wear clothes that are easy to get on and off and wear slip-on shoes.  Thank you for allowing [place] to care for you!   -- [facility]

## 2017-12-23 NOTE — Progress Notes (Signed)
Cardiology Office Note:    Date:  12/23/2017   ID:  Joshua [name]., DOB March 13, 1941, MRN [id number]  PCP:  Dickie La, MD  Cardiologist:  Sinclair Grooms, MD   Referring MD: Dickie La, MD   Chief Complaint  Patient presents with  . Chest Pain    History of Present Illness:    Joshua [name]. is a 77 y.o. male with a hx of coronary artery disease status post bypass surgery in 1990 and subsequent PCI to the SVG-RCA with a bare-metal stent in 2011.  He was admitted in July 2018 with non-ST elevation myocardial infarction and underwent PCI with a DES x2 to the SVG-RCA.  He was last seen by Truitt Merle, NP in October 2018.  Joshua [name] returns for evaluation of chest pain and shortness of breath.  He is here with his wife Joshua [name].  He was doing well when he finished in cardiac rehabilitation in 10/2017.  However, over the past 2-3 weeks, he has started to have substernal chest ache with associated shortness of breath while riding his exercise bike.  He has had to stop his exercise early.  He also notes significant dyspnea with minimal exertion.  He denies syncope, PND or edema.  He has not taken nitroglycerin.  His symptoms are similar to what he has had in the past with angina.   Prior CV studies:   The following studies were reviewed today:  Cardiac catheterization 06/24/17 LAD ostial 100 RI 100 LCx ostial 100 RCA ostial 100 SVG-D1 occluded LIMA-OM2 patent RIMA-distal LAD patent SVG-distal RCA stent at origin 43 ISR, mid stent 99 ISR, distal 40 EF 55-65 PCI: 4 x 12 mm Resolute Onyx DES to the ostial SVG-RCA; 3.5 x 15 mm Resolute Onyx DES to the mid SVG-RCA  Echocardiogram 02/26/17 EF 55-60, normal wall motion, mild MR, mild LAE, PASP 43  Past Medical History:  Diagnosis Date  . Coronary artery disease involving native coronary artery of native heart with angina pectoris ([place])    a. 1990 s/p CABG x 4 (RIMA->LAD, LIMA->LCX, VG->Diag, VG->RCA); b. 2011 s/p BMS to  VG->RCA.; c. Unstable Angina 05/2017: Patent RIMA-LAD & LIMA-LCx, ostSVG-RCA 65% ISR & mSVG-RCA  99% - DES PCI to both  . GERD (gastroesophageal reflux disease)   . History of echocardiogram    a. 01/2017 Echo: EF 55-60%, no rwma, mild MR, midlly dil LA, PASP 51mmHg.  Marland Kitchen History of hiatal hernia   . Hyperlipidemia   . Hypertension   . Obesity     Past Surgical History:  Procedure Laterality Date  . CATARACT EXTRACTION W/ INTRAOCULAR LENS  IMPLANT, BILATERAL Bilateral   . CORONARY ANGIOPLASTY WITH STENT PLACEMENT  2011   "RCA"  . CORONARY ARTERY BYPASS GRAFT  1990   CABG X4  . CORONARY STENT INTERVENTION N/A 06/24/2017   Procedure: Coronary Stent Intervention;  Surgeon: Belva Crome, MD;  Location: [facility];  Service: Cardiovascular: Ostial 4.0 x 12 mm Onyx DES reducing 70% stenosis to less than 40%. Distal body 3.5 x 15 Onyx DES reducing 99% stenosis to less than 30%.  Marland Kitchen LEFT HEART CATH AND CORS/GRAFTS ANGIOGRAPHY N/A 06/24/2017   Procedure: Left Heart Cath and Cors/Grafts Angiography;  Surgeon: Belva Crome, MD;  Location: [facility];  Service: Cardiovascular:  CTO native RCA, LAD & LCx (Graft Dependent). Patent LIMA-LCx & RIMA-LAD.  SVG-RCA: ost 65% ISR & mid 99% --> DES PCI to both  Current Medications: Current Meds  Medication Sig  . amitriptyline (ELAVIL) 25 MG tablet Take 25 mg by mouth at bedtime.  Marland Kitchen aspirin EC 81 MG EC tablet Take 1 tablet (81 mg total) by mouth daily.  Marland Kitchen atenolol-chlorthalidone (TENORETIC) 100-25 MG tablet TAKE 1 TABLET BY MOUTH EVERY DAY  . cetirizine (ZYRTEC) 10 MG tablet Take 10 mg by mouth daily.  . citalopram (CELEXA) 20 MG tablet Take 1 tablet (20 mg total) by mouth daily.  Marland Kitchen esomeprazole (NEXIUM) 20 MG capsule Take 20 mg by mouth daily.  Marland Kitchen ezetimibe (ZETIA) 10 MG tablet Take 10 mg by mouth daily.  Marland Kitchen gabapentin (NEURONTIN) 100 MG capsule Take 1 to 3 tabs three times a day as needed to taper off medicine patient has written taper  (Patient taking differently: Take 400-500 mg by mouth See admin instructions. Take 500 mg by mouth in the morning, take 400 mg by mouth at lunch and take 500 mg by mouth at bedtime)  . Multiple Vitamins-Minerals (CENTRUM SILVER 50+MEN) TABS Take 1 tablet by mouth daily.  . nitroGLYCERIN (NITROSTAT) 0.4 MG SL tablet Place 1 tablet (0.4 mg total) under the tongue every 5 (five) minutes x 3 doses as needed for chest pain.  . simvastatin (ZOCOR) 40 MG tablet Take 40 mg by mouth daily.  . ticagrelor (BRILINTA) 90 MG TABS tablet Take 1 tablet (90 mg total) by mouth 2 (two) times daily.  . vitamin B-12 (CYANOCOBALAMIN) 1000 MCG tablet Take 1,000 mcg by mouth daily.  . [DISCONTINUED] gabapentin (NEURONTIN) 300 MG capsule Take 450 mg by mouth 3 (three) times daily.      Allergies:   Ace inhibitors; Atorvastatin; Rosuvastatin; Plavix [clopidogrel bisulfate]; Sulfa antibiotics; and Sulfonamide derivatives   Social History   Tobacco Use  . Smoking status: Never Smoker  . Smokeless tobacco: Former Systems developer    Types: Chew  Substance Use Topics  . Alcohol use: Yes    Alcohol/week: 0.0 oz    Comment: 06/23/2017 "a few drinks/ month  . Drug use: No     Family Hx: The patient's family history includes Cancer in his mother; Coronary artery disease in his father; Heart attack in his father; Hyperlipidemia in his son; Hypertension in his son.  ROS:   Please see the history of present illness.    Review of Systems  Cardiovascular: Positive for chest pain.   All other systems reviewed and are negative.   EKGs/Labs/Other Test Reviewed:    EKG:  EKG is  ordered today.  The ekg ordered today demonstrates normal sinus rhythm, HR 71, right bundle branch block, PACs, QTC 482  Recent Labs: 10/06/2017: ALT 29 12/23/2017: BUN 19; Creatinine, Ser 0.99; Hemoglobin WILL FOLLOW; Platelets WILL FOLLOW; Potassium 4.4; Sodium 136   Recent Lipid Panel Lab Results  Component Value Date/Time   CHOL 136 10/06/2017 07:40  AM   TRIG 151 (H) 10/06/2017 07:40 AM   HDL 27 (L) 10/06/2017 07:40 AM   CHOLHDL 5.0 10/06/2017 07:40 AM   CHOLHDL 5.6 12/14/2014 11:47 AM   LDLCALC 79 10/06/2017 07:40 AM   LDLDIRECT 84 05/14/2016 12:34 PM    Physical Exam:    VS:  BP 118/60   Pulse 70   Ht 5' 6.75" (1.695 m)   Wt 223 lb (101.2 kg)   SpO2 92%   BMI 35.19 kg/m     Wt Readings from Last 3 Encounters:  12/23/17 223 lb (101.2 kg)  10/02/17 215 lb 2.7 oz (97.6 kg)  09/29/17 215 lb  12.8 oz (97.9 kg)     Physical Exam  Constitutional: He is oriented to person, place, and time. He appears well-developed and well-nourished. No distress.  HENT:  Head: Normocephalic and atraumatic.  Eyes: No scleral icterus.  Neck: No JVD present.  Cardiovascular: Normal rate and regular rhythm.  No murmur heard. Pulmonary/Chest: Effort normal. He has no rales.  Abdominal: Soft.  Musculoskeletal: He exhibits no edema.  Neurological: He is alert and oriented to person, place, and time.  Skin: Skin is warm and dry.    ASSESSMENT:    1. Coronary artery disease involving coronary bypass graft of native heart with angina pectoris ([place])   2. Essential hypertension   3. Hyperlipidemia with target low density lipoprotein (LDL) cholesterol less than 70 mg/dL    PLAN:    In order of problems listed above:  1. Coronary artery disease involving coronary bypass graft of native heart with angina pectoris History of bypass surgery in 1990, PCI to the SVG-RCA in 2011 and most recently, DES x2 to the [place] in July 2018.  He presents today with complaints of chest discomfort and shortness of breath similar to his previous angina.  He describes CCS class III anginal symptoms.  I have recommended proceeding with catheterization to further evaluate.  I reviewed this with Dr. Curt Bears (attending physician).  He agrees.  Risks and benefits of cardiac catheterization have been discussed with the patient.  These include bleeding, infection, kidney  damage, stroke, heart attack, death.  The patient understands these risks and is willing to proceed.   -Add isosorbide 15 mg daily  -Proceed with left heart catheterization with Dr. Tamala Julian  -Continue aspirin, Brilinta, beta-blocker, statin  2. Essential hypertension The patient's blood pressure is controlled on his current regimen.  Continue current therapy.    3. Hyperlipidemia with target low density lipoprotein (LDL) cholesterol less than 70 mg/dL Recent LDL 79 in November 2018.  He remains on simvastatin and ezetimibe.  He has intolerances to rosuvastatin and atorvastatin.  If he requires recurrent PCI, consider referral to lipid clinic for consideration of PCSK-9 inhibitor therapy.   Dispo:  Return for Post Procedure Follow Up with Dr. Tamala Julian, or Richardson Dopp, PA-C.   Medication Adjustments/Labs and Tests Ordered: Current medicines are reviewed at length with the patient today.  Concerns regarding medicines are outlined above.  Tests Ordered: Orders Placed This Encounter  Procedures  . Basic Metabolic Panel (BMET)  . CBC  . INR/PT  . EKG 12-Lead   Medication Changes: Meds ordered this encounter  Medications  . isosorbide mononitrate (IMDUR) 30 MG 24 hr tablet    Sig: Take 0.5 tablets (15 mg total) by mouth daily.    Dispense:  90 tablet    Refill:  3    Signed, Richardson Dopp, PA-C  12/23/2017 5:25 PM    [facility] [place], [place], [place]  [address] Phone: [phone]; Fax: [phone]

## 2017-12-24 ENCOUNTER — Telehealth: Payer: Self-pay

## 2017-12-24 ENCOUNTER — Telehealth: Payer: Self-pay | Admitting: Family Medicine

## 2017-12-24 DIAGNOSIS — R079 Chest pain, unspecified: Secondary | ICD-10-CM

## 2017-12-24 LAB — BASIC METABOLIC PANEL
BUN/Creatinine Ratio: 19 (ref 10–24)
BUN: 19 mg/dL (ref 8–27)
CALCIUM: 9 mg/dL (ref 8.6–10.2)
CO2: 22 mmol/L (ref 20–29)
Chloride: 100 mmol/L (ref 96–106)
Creatinine, Ser: 0.99 mg/dL (ref 0.76–1.27)
GFR calc non Af Amer: 75 mL/min/{1.73_m2} (ref >59)
GFR, EST AFRICAN AMERICAN: 86 mL/min/{1.73_m2} (ref >59)
Glucose: 100 mg/dL — ABNORMAL HIGH (ref 65–99)
Potassium: 4.4 mmol/L (ref 3.5–5.2)
Sodium: 136 mmol/L (ref 134–144)

## 2017-12-24 LAB — PROTIME-INR
INR: 1.1 (ref 0.8–1.2)
PROTHROMBIN TIME: 11.2 s (ref 9.1–12.0)

## 2017-12-24 LAB — CBC
Hematocrit: 31.4 % — ABNORMAL LOW (ref 37.5–51.0)
Hemoglobin: 10 g/dL — ABNORMAL LOW (ref 13.0–17.7)
MCH: 24.6 pg — ABNORMAL LOW (ref 26.6–33.0)
MCHC: 31.8 g/dL (ref 31.5–35.7)
MCV: 77 fL — AB (ref 79–97)
Platelets: 227 10*3/uL (ref 150–379)
RBC: 4.07 x10E6/uL — AB (ref 4.14–5.80)
RDW: 15.4 % (ref 12.3–15.4)
WBC: 7.9 10*3/uL (ref 3.4–10.8)

## 2017-12-24 MED ORDER — ISOSORBIDE MONONITRATE ER 30 MG PO TB24: 30.0000 mg | ORAL_TABLET | Freq: Every day | ORAL | 3 refills | Status: DC

## 2017-12-24 NOTE — Telephone Encounter (Signed)
-----   Message from Liliane Shi, [place] sent at 12/24/2017 10:42 AM EST ----- Please call patient. Creatinine, potassium, INR normal. Hemoglobin is low.  This has dropped from 13.9 in July 2018 to 10.0 in January 2019.  MCV is low suggesting iron deficiency. GI blood loss needs to be ruled out.  Anemia may be the cause of his symptoms.   I reviewed his case with Dr. Tamala Julian.  We feel he should hold off on proceeding with cardiac catheterization at this time. PLAN: 1. Cancel Cardiac Catheterization  2. Increase Imdur to 30 mg Once daily  3. Arrange follow up with PCP in the next 1 week (needs ASAP appt) 4. Fax labs to PCP - Dickie La, MD  5. Repeat CBC this week.  Also check Iron studies (iron, TIBC, percent saturation), Ferritin and send result to PCP. 6. Keep follow up as planned with Cardiology. 7. He needs earlier follow up with Cardiology if he has worsening chest pain or has to take NTG for symptoms despite taking Imdur 8. He needs to go to the ED if he has chest pain at rest. Richardson Dopp, PA-C    12/24/2017 10:38 AM

## 2017-12-24 NOTE — Telephone Encounter (Signed)
Dr Pernell Dupre with cardiology has requested pt to see dr neal within 2 weeks. There are no appts available until 01-13-18. Please advise

## 2017-12-24 NOTE — Telephone Encounter (Signed)
lpmtcb 1/24 md

## 2017-12-24 NOTE — Telephone Encounter (Signed)
Spoke with patient and reviewed all of the instructions per Richardson Dopp PA;

## 2017-12-25 ENCOUNTER — Telehealth: Payer: Self-pay | Admitting: *Deleted

## 2017-12-25 ENCOUNTER — Other Ambulatory Visit: Payer: PPO | Admitting: *Deleted

## 2017-12-25 DIAGNOSIS — R079 Chest pain, unspecified: Secondary | ICD-10-CM

## 2017-12-25 MED ORDER — FERROUS SULFATE 325 (65 FE) MG PO TABS: 325.0000 mg | ORAL_TABLET | Freq: Three times a day (TID) | ORAL | 0 refills | Status: DC

## 2017-12-25 NOTE — Telephone Encounter (Signed)
See if you can put him in my colpo clinic on Feb 7th THANKS! Dorcas Mcmurray

## 2017-12-25 NOTE — Telephone Encounter (Signed)
DPR ok to s/w pt's wife. I went over lab results with pt's wife and recommendations. She wanted to know why was pt needing to see PCP so soon, she states the nurse yesterday did not explain to her why. I explained to her what the lab results values from yesterday and today as to what they mean. I did also tell her that she said she was told yesterday that pt needed the appt with PCP but nothing until 01/13/18. I stated to the pt that I called PCP today and got pt scheduled for 12/29/17 Tuesday at 2:30. I advised of starting the FE 325 mg TID. I will send in Rx. Pt's wife is aware PCP will then take over at to how to manage anemia. Pt wife thanked me for my call and my help.

## 2017-12-25 NOTE — Telephone Encounter (Signed)
-----   Message from Liliane Shi, [place] sent at 12/25/2017  5:08 PM EST ----- Please call the patient Labs indicate significant iron deficiency.  CBC still pending. Patient needs evaluation of anemia.  If unable to get primary care follow-up next week, refer to gastroenterology. Start ferrous sulfate 325 mg 3 times a day.  Duration of iron therapy per primary care. Richardson Dopp, PA-C 12/25/2017 5:06 PM

## 2017-12-26 ENCOUNTER — Other Ambulatory Visit: Payer: Self-pay

## 2017-12-26 ENCOUNTER — Encounter (HOSPITAL_COMMUNITY): Payer: Self-pay | Admitting: Emergency Medicine

## 2017-12-26 ENCOUNTER — Telehealth: Payer: Self-pay | Admitting: Physician Assistant

## 2017-12-26 ENCOUNTER — Emergency Department (HOSPITAL_COMMUNITY)
Admission: EM | Admit: 2017-12-26 | Discharge: 2017-12-26 | Disposition: A | Discharge status: Home / Self Care | Payer: PPO | Attending: Emergency Medicine | Admitting: Emergency Medicine

## 2017-12-26 DIAGNOSIS — Z7982 Long term (current) use of aspirin: Secondary | ICD-10-CM | POA: Insufficient documentation

## 2017-12-26 DIAGNOSIS — I10 Essential (primary) hypertension: Secondary | ICD-10-CM | POA: Diagnosis not present

## 2017-12-26 DIAGNOSIS — R710 Precipitous drop in hematocrit: Secondary | ICD-10-CM

## 2017-12-26 DIAGNOSIS — Z87891 Personal history of nicotine dependence: Secondary | ICD-10-CM | POA: Insufficient documentation

## 2017-12-26 DIAGNOSIS — Z79899 Other long term (current) drug therapy: Secondary | ICD-10-CM | POA: Insufficient documentation

## 2017-12-26 DIAGNOSIS — D649 Anemia, unspecified: Secondary | ICD-10-CM | POA: Insufficient documentation

## 2017-12-26 DIAGNOSIS — R0602 Shortness of breath: Secondary | ICD-10-CM | POA: Diagnosis not present

## 2017-12-26 DIAGNOSIS — I25119 Atherosclerotic heart disease of native coronary artery with unspecified angina pectoris: Secondary | ICD-10-CM | POA: Diagnosis not present

## 2017-12-26 DIAGNOSIS — R5383 Other fatigue: Secondary | ICD-10-CM | POA: Diagnosis not present

## 2017-12-26 LAB — TYPE AND SCREEN
ABO/RH(D): A POS
Antibody Screen: NEGATIVE

## 2017-12-26 LAB — COMPREHENSIVE METABOLIC PANEL
ALT: 24 U/L (ref 17–63)
ANION GAP: 10 (ref 5–15)
AST: 39 U/L (ref 15–41)
Albumin: 3.4 g/dL — ABNORMAL LOW (ref 3.5–5.0)
Alkaline Phosphatase: 54 U/L (ref 38–126)
BUN: 20 mg/dL (ref 6–20)
CHLORIDE: 103 mmol/L (ref 101–111)
CO2: 23 mmol/L (ref 22–32)
Calcium: 8.8 mg/dL — ABNORMAL LOW (ref 8.9–10.3)
Creatinine, Ser: 1.01 mg/dL (ref 0.61–1.24)
GFR calc non Af Amer: >60 mL/min (ref >60)
Glucose, Bld: 137 mg/dL — ABNORMAL HIGH (ref 65–99)
POTASSIUM: 4.1 mmol/L (ref 3.5–5.1)
SODIUM: 136 mmol/L (ref 135–145)
Total Bilirubin: 0.4 mg/dL (ref 0.3–1.2)
Total Protein: 6.2 g/dL — ABNORMAL LOW (ref 6.5–8.1)

## 2017-12-26 LAB — CBC
HEMATOCRIT: 30.5 % — AB (ref 39.0–52.0)
HEMOGLOBIN: 9.1 g/dL — AB (ref 13.0–17.0)
MCH: 23.7 pg — AB (ref 26.0–34.0)
MCHC: 29.8 g/dL — ABNORMAL LOW (ref 30.0–36.0)
MCV: 79.4 fL (ref 78.0–100.0)
Platelets: 215 10*3/uL (ref 150–400)
RBC: 3.84 MIL/uL — AB (ref 4.22–5.81)
RDW: 15 % (ref 11.5–15.5)
WBC: 7.3 10*3/uL (ref 4.0–10.5)

## 2017-12-26 LAB — ABO/RH: ABO/RH(D): A POS

## 2017-12-26 LAB — CBC WITH DIFFERENTIAL/PLATELET
BASOS: 0 %
Basophils Absolute: 0 10*3/uL (ref 0.0–0.2)
EOS (ABSOLUTE): 0.4 10*3/uL (ref 0.0–0.4)
EOS: 5 %
Hematocrit: 29 % — ABNORMAL LOW (ref 37.5–51.0)
Hemoglobin: 9 g/dL — ABNORMAL LOW (ref 13.0–17.7)
IMMATURE GRANS (ABS): 0 10*3/uL (ref 0.0–0.1)
IMMATURE GRANULOCYTES: 1 %
LYMPHS: 26 %
Lymphocytes Absolute: 1.8 10*3/uL (ref 0.7–3.1)
MCH: 24.1 pg — AB (ref 26.6–33.0)
MCHC: 31 g/dL — ABNORMAL LOW (ref 31.5–35.7)
MCV: 78 fL — AB (ref 79–97)
MONOS ABS: 0.7 10*3/uL (ref 0.1–0.9)
Monocytes: 10 %
NEUTROS PCT: 58 %
Neutrophils Absolute: 4.1 10*3/uL (ref 1.4–7.0)
PLATELETS: 186 10*3/uL (ref 150–379)
RBC: 3.74 x10E6/uL — AB (ref 4.14–5.80)
RDW: 15.4 % (ref 12.3–15.4)
WBC: 7.1 10*3/uL (ref 3.4–10.8)

## 2017-12-26 LAB — URINALYSIS, ROUTINE W REFLEX MICROSCOPIC
BILIRUBIN URINE: NEGATIVE
Glucose, UA: NEGATIVE mg/dL
Hgb urine dipstick: NEGATIVE
KETONES UR: NEGATIVE mg/dL
LEUKOCYTES UA: NEGATIVE
NITRITE: NEGATIVE
PROTEIN: NEGATIVE mg/dL
Specific Gravity, Urine: 1.019 (ref 1.005–1.030)
pH: 6 (ref 5.0–8.0)

## 2017-12-26 LAB — IRON,TIBC AND FERRITIN PANEL
Ferritin: 12 ng/mL — ABNORMAL LOW (ref 30–400)
IRON: 25 ug/dL — AB (ref 38–169)
Iron Saturation: 6 % — CL (ref 15–55)
Total Iron Binding Capacity: 430 ug/dL (ref 250–450)
UIBC: 405 ug/dL — ABNORMAL HIGH (ref 111–343)

## 2017-12-26 LAB — POC OCCULT BLOOD, ED: FECAL OCCULT BLD: NEGATIVE

## 2017-12-26 NOTE — ED Triage Notes (Signed)
Pt presents to ED for assessment after having pre-procedure blood work Wednesday with a Hgb of 10.  Two days later another draw with a Hgb of 9.  Concern for bleeding, cath cancelled, patient sent here.

## 2017-12-26 NOTE — Discharge Instructions (Signed)
As discussed, your urine and hemoccult were negative today and did not show blood in the urine or stool. Your hemaoglobin was stable at 9.1 from  Follow up with your Primary care provider for repeat testing and monitoring.  Return sooner if chest pain, sob, dizziness, bad headache, nausea/vomiting, abdominal pain, dark tarry stools, blood in the stool or other new concerning symptoms in the meantime.

## 2017-12-26 NOTE — Telephone Encounter (Signed)
Received labs from yesterday.  His hemoglobin is now 9.  It was 10 on 1/23.  His MCV is low and his Iron studies suggest significant iron deficiency.  I think he is actively bleeding.  As he is on ASA and Brilinta, he needs urgent evaluation. I attempted to call his house twice today.  I finally reached his wife by telephone.  PLAN: 1. Go to [facility] ED to evaluate anemia and r/o active GI bleed. Richardson Dopp, PA-C    12/26/2017 3:54 PM

## 2017-12-26 NOTE — ED Provider Notes (Signed)
[facility] EMERGENCY DEPARTMENT Provider Note   CSN: [id number] Arrival date & time: 12/26/17  1653     History   Chief Complaint Chief Complaint  Patient presents with  . Abnormal Lab    HPI Joshua [name]. is a 77 y.o. male with past medical history significant for CAD, hypertension, hyperlipidemia, presenting after receiving a call from his PCP stating that his hemoglobin had dropped from 10 to 9 over two days and told to be evaluated in the emergency department.  Patient was scheduled for a cardiac cath by his cardiologist on Wednesday and canceled due to trending down hemoglobin.  Patient denies any symptoms, no shortness of breath, chest pain, dizziness, lightheadedness, nausea, vomiting.   HPI  Past Medical History:  Diagnosis Date  . Coronary artery disease involving native coronary artery of native heart with angina pectoris ([place])    a. 1990 s/p CABG x 4 (RIMA->LAD, LIMA->LCX, VG->Diag, VG->RCA); b. 2011 s/p BMS to VG->RCA.; c. Unstable Angina 05/2017: Patent RIMA-LAD & LIMA-LCx, ostSVG-RCA 65% ISR & mSVG-RCA  99% - DES PCI to both  . GERD (gastroesophageal reflux disease)   . History of echocardiogram    a. 01/2017 Echo: EF 55-60%, no rwma, mild MR, midlly dil LA, PASP 89mmHg.  Marland Kitchen History of hiatal hernia   . Hyperlipidemia   . Hypertension   . Obesity     Patient Active Problem List   Diagnosis Date Noted  . History of non-ST elevation myocardial infarction (NSTEMI) 06/25/2017  . Presence of drug coated stent in SVG-RCA 06/25/2017  . Absent pedal pulses 02/09/2017  . Postherpetic neuralgia 01/02/2017  . Tremor 05/15/2016  . Irritability and anger 05/15/2016  . Seborrheic keratoses 06/16/2014  . Special screening for malignant neoplasm of prostate 06/14/2014  . WRIST PAIN, LEFT 11/04/2010  . Retinal hemorrhage 07/23/2010  . Impotence of organic origin 01/16/2010  . Essential hypertension 04/18/2009  . Hyperlipidemia with target low  density lipoprotein (LDL) cholesterol less than 70 mg/dL 01/28/2007  . CAD (coronary artery disease) 01/28/2007  . HERNIA, HIATAL, NONCONGENITAL 01/28/2007    Past Surgical History:  Procedure Laterality Date  . CATARACT EXTRACTION W/ INTRAOCULAR LENS  IMPLANT, BILATERAL Bilateral   . CORONARY ANGIOPLASTY WITH STENT PLACEMENT  2011   "RCA"  . CORONARY ARTERY BYPASS GRAFT  1990   CABG X4  . CORONARY STENT INTERVENTION N/A 06/24/2017   Procedure: Coronary Stent Intervention;  Surgeon: Belva Crome, MD;  Location: [facility];  Service: Cardiovascular: Ostial 4.0 x 12 mm Onyx DES reducing 70% stenosis to less than 40%. Distal body 3.5 x 15 Onyx DES reducing 99% stenosis to less than 30%.  Marland Kitchen LEFT HEART CATH AND CORS/GRAFTS ANGIOGRAPHY N/A 06/24/2017   Procedure: Left Heart Cath and Cors/Grafts Angiography;  Surgeon: Belva Crome, MD;  Location: [facility];  Service: Cardiovascular:  CTO native RCA, LAD & LCx (Graft Dependent). Patent LIMA-LCx & RIMA-LAD.  SVG-RCA: ost 65% ISR & mid 99% --> DES PCI to both       Home Medications    Prior to Admission medications   Medication Sig Start Date End Date Taking? Authorizing Provider  amitriptyline (ELAVIL) 25 MG tablet Take 25 mg by mouth at bedtime.    [provider]  aspirin EC 81 MG EC tablet Take 1 tablet (81 mg total) by mouth daily. 06/26/17   Cheryln Manly, NP  atenolol-chlorthalidone (TENORETIC) 100-25 MG tablet TAKE 1 TABLET BY MOUTH EVERY  DAY 11/19/17   Dickie La, MD  cetirizine (ZYRTEC) 10 MG tablet Take 10 mg by mouth daily.    [provider]  citalopram (CELEXA) 20 MG tablet Take 1 tablet (20 mg total) by mouth daily. 12/16/16   Dickie La, MD  esomeprazole (NEXIUM) 20 MG capsule Take 20 mg by mouth daily.    [provider]  ezetimibe (ZETIA) 10 MG tablet Take 10 mg by mouth daily.    [provider]  ferrous sulfate 325 (65 FE) MG tablet Take 1 tablet (325 mg total) by  mouth 3 (three) times daily with meals. 12/25/17   Richardson Dopp T, PA-C  gabapentin (NEURONTIN) 100 MG capsule Take 1 to 3 tabs three times a day as needed to taper off medicine patient has written taper Patient taking differently: Take 400-500 mg by mouth See admin instructions. Take 500 mg by mouth in the morning, take 400 mg by mouth at lunch and take 500 mg by mouth at bedtime 10/26/17   Dickie La, MD  isosorbide mononitrate (IMDUR) 30 MG 24 hr tablet Take 1 tablet (30 mg total) by mouth daily. 12/24/17 03/24/18  Richardson Dopp T, PA-C  Multiple Vitamins-Minerals (CENTRUM SILVER 50+MEN) TABS Take 1 tablet by mouth daily.    [provider]  nitroGLYCERIN (NITROSTAT) 0.4 MG SL tablet Place 1 tablet (0.4 mg total) under the tongue every 5 (five) minutes x 3 doses as needed for chest pain. 06/25/17   Cheryln Manly, NP  simvastatin (ZOCOR) 40 MG tablet Take 40 mg by mouth daily.    [provider]  ticagrelor (BRILINTA) 90 MG TABS tablet Take 1 tablet (90 mg total) by mouth 2 (two) times daily. 06/25/17   Cheryln Manly, NP  vitamin B-12 (CYANOCOBALAMIN) 1000 MCG tablet Take 1,000 mcg by mouth daily.    [provider]    Family History Family History  Problem Relation Age of Onset  . Cancer Mother        ovarian  . Hyperlipidemia Son   . Hypertension Son   . Heart attack Father   . Coronary artery disease Father     Social History Social History   Tobacco Use  . Smoking status: Never Smoker  . Smokeless tobacco: Former Systems developer    Types: Chew  Substance Use Topics  . Alcohol use: Yes    Alcohol/week: 0.0 oz    Comment: 06/23/2017 "a few drinks/ month  . Drug use: No     Allergies   Ace inhibitors; Atorvastatin; Rosuvastatin; Plavix [clopidogrel bisulfate]; Sulfa antibiotics; and Sulfonamide derivatives   Review of Systems Review of Systems  Constitutional: Positive for fatigue. Negative for chills, diaphoresis and fever.  Eyes: Negative for  pain and visual disturbance.  Respiratory: Positive for shortness of breath. Negative for cough, choking, chest tightness, wheezing and stridor.        Patient has been experiencing increased shortness of breath on exertion over the last few weeks.  Currently followed by cardiology who has scheduled an outpatient cath. No shortness of breath at this time.  Cardiovascular: Negative for chest pain, palpitations and leg swelling.  Gastrointestinal: Negative for abdominal distention, abdominal pain, anal bleeding, blood in stool, diarrhea, nausea and vomiting.  Genitourinary: Negative for difficulty urinating, dysuria, flank pain, frequency and hematuria.  Musculoskeletal: Negative for arthralgias, back pain, myalgias, neck pain and neck stiffness.  Skin: Negative for color change, pallor, rash and wound.  Neurological: Negative for dizziness, tremors, seizures, syncope,  facial asymmetry, speech difficulty, weakness, light-headedness, numbness and headaches.     Physical Exam Updated Vital Signs BP (!) 117/59   Pulse 75   Temp 98.4 F (36.9 C) (Oral)   Resp (!) 22   SpO2 97%   Physical Exam  Constitutional: He is oriented to person, place, and time. He appears well-developed and well-nourished. No distress.  Well-appearing, nontoxic afebrile sitting comfortably in bed no acute distress.  HENT:  Head: Normocephalic and atraumatic.  Eyes: Conjunctivae are normal. Right eye exhibits no discharge. Left eye exhibits no discharge.  Neck: Normal range of motion. Neck supple.  Cardiovascular: Normal rate, regular rhythm, normal heart sounds and intact distal pulses.  Pulmonary/Chest: Effort normal and breath sounds normal. No stridor. No respiratory distress. He has no wheezes. He has no rales.  Abdominal: Soft. He exhibits no distension and no mass. There is no tenderness. There is no rebound and no guarding.  Genitourinary: Rectum normal. Rectal exam shows guaiac negative stool.  Genitourinary  Comments: Stool on rectal exam without gross blood.  No hemorrhoids or fissure.  Negative Hemoccult  Musculoskeletal: Normal range of motion. He exhibits no edema.  Neurological: He is alert and oriented to person, place, and time.  Skin: Skin is warm and dry. No rash noted. He is not diaphoretic. No erythema. No pallor.  Psychiatric: He has a normal mood and affect.  Nursing note and vitals reviewed.    ED Treatments / Results  Labs (all labs ordered are listed, but only abnormal results are displayed) Labs Reviewed  COMPREHENSIVE METABOLIC PANEL - Abnormal; Notable for the following components:      Result Value   Glucose, Bld 137 (*)    Calcium 8.8 (*)    Total Protein 6.2 (*)    Albumin 3.4 (*)    All other components within normal limits  CBC - Abnormal; Notable for the following components:   RBC 3.84 (*)    Hemoglobin 9.1 (*)    HCT 30.5 (*)    MCH 23.7 (*)    MCHC 29.8 (*)    All other components within normal limits  URINALYSIS, ROUTINE W REFLEX MICROSCOPIC  POC OCCULT BLOOD, ED  TYPE AND SCREEN  ABO/RH    EKG  EKG Interpretation None       Radiology No results found.  Procedures Procedures (including critical care time)  Medications Ordered in ED Medications - No data to display   Initial Impression / Assessment and Plan / ED Course  I have reviewed the triage vital signs and the nursing notes.  Pertinent labs & imaging results that were available during my care of the patient were reviewed by me and considered in my medical decision making (see chart for details).    Presenting with abnormal hemoglobin at 9.1 here in the emergency department.  Stable from his last hemoglobin yesterday of 9. Patient is asymptomatic from this.  No need for blood transfusion.  Hemoccult negative Urine negative  Patient here to be evaluated for concern for bleed.  He denies any lightheadedness, dizziness, melena, hematochezia, dysuria, hematuria or abdominal  pain. He has been having increased shortness of breath on exertion and fatigue over the last few weeks and cardiology had scheduled him for a cath.  Wednesday pre-procedure labs were drawn which revealed a hemoglobin of 10 and 2 days later dropped to 9 and procedure was canceled until further evaluation.  Patient is hemodynamically stable here in the emergency department, well-appearing without complaints.   We  will discharge patient home with close follow-up with PCP for repeat hemoglobin and monitoring as well as rescheduling when appropriate  Discussed strict return precautions and advised to return to the emergency department if experiencing any new or worsening symptoms. Instructions were understood and patient agreed with discharge plan. Patient was discussed with Dr. Jeanell Sparrow who agrees with assessment and plan. Final Clinical Impressions(s) / ED Diagnoses   Final diagnoses:  Hemoglobin decreased    ED Discharge Orders    None       Dossie Der 12/26/17 2149    Pattricia Boss, MD 12/26/17 [phone]

## 2017-12-26 NOTE — ED Notes (Signed)
Pt discharged from ED; instructions provided; Pt encouraged to return to ED if symptoms worsen and to f/u with PCP; Pt verbalized understanding of all instructions 

## 2017-12-29 ENCOUNTER — Encounter: Payer: Self-pay | Admitting: Student

## 2017-12-29 ENCOUNTER — Other Ambulatory Visit: Payer: Self-pay

## 2017-12-29 ENCOUNTER — Ambulatory Visit (INDEPENDENT_AMBULATORY_CARE_PROVIDER_SITE_OTHER): Payer: PPO | Admitting: Student

## 2017-12-29 VITALS — BP 110/62 | HR 72 | Temp 97.9°F | Ht 67.0 in | Wt 223.2 lb

## 2017-12-29 DIAGNOSIS — D509 Iron deficiency anemia, unspecified: Secondary | ICD-10-CM | POA: Insufficient documentation

## 2017-12-29 LAB — POCT HEMOGLOBIN: HEMOGLOBIN: 9.1 g/dL — AB (ref 14.1–18.1)

## 2017-12-29 NOTE — Progress Notes (Signed)
Subjective:    Joshua [name] is a 77 y.o. old male here for anemia.  He is here with his wife.  HPI Anemia: he went to his cardiologist for pre-cath evaluation and had his CBC checked which was significant for Hgb of 10. His Hgb was 12.1 three months ago. He was a started on oral iron.  Repeat Hgb on 1/25 was down to 9.0. He was advised to go to ED by his cardiologist.  In ED, his hemoglobin was 9.1.  Patient has had fatigue and shortness of breath for two to three months.  His symptoms of gotten worse since the beginning of the year but denies acute change over the last 1 week.  He reports feeling short of breath with ambulation.  Denies chest pain.  He denies hematochezia or melena.  He had a colonoscopy 3 years ago which showed 2 diminutive polyps in ascending colon that was biopsied.  Pathology showed tubular adenoma and benign lymphoid polyp without high-grade dysplasia or malignancy.  He also had FOBT 3 days ago in ED which was negative for occult blood.  He is on Brilinta and aspirin.  He has history of CAD status post DES stent about 6 months ago.  Recommendation at that time was to stay on Brilinta and aspirin at least for 1 year.  PMH/Problem List: has Hyperlipidemia with target low density lipoprotein (LDL) cholesterol less than 70 mg/dL; Retinal hemorrhage; Essential hypertension; CAD (coronary artery disease); HERNIA, HIATAL, NONCONGENITAL; Impotence of organic origin; WRIST PAIN, LEFT; Special screening for malignant neoplasm of prostate; Seborrheic keratoses; Tremor; Irritability and anger; Postherpetic neuralgia; Absent pedal pulses; History of non-ST elevation myocardial infarction (NSTEMI); Presence of drug coated stent in SVG-RCA; and Iron deficiency anemia on their problem list.   has a past medical history of Coronary artery disease involving native coronary artery of native heart with angina pectoris ([place]), GERD (gastroesophageal reflux disease), History of echocardiogram, History of  hiatal hernia, Hyperlipidemia, Hypertension, and Obesity.  FH:  Family History  Problem Relation Age of Onset  . Cancer Mother        ovarian  . Hyperlipidemia Son   . Hypertension Son   . Heart attack Father   . Coronary artery disease Father     SH Social History   Tobacco Use  . Smoking status: Never Smoker  . Smokeless tobacco: Former Systems developer    Types: Chew  Substance Use Topics  . Alcohol use: Yes    Alcohol/week: 0.0 oz    Comment: 06/23/2017 "a few drinks/ month  . Drug use: No    Review of Systems Review of systems negative except for pertinent positives and negatives in history of present illness above.     Objective:     Vitals:   12/29/17 1440  BP: 110/62  Pulse: 72  Temp: 97.9 F (36.6 C)  TempSrc: Oral  SpO2: 97%  Weight: 223 lb 3.2 oz (101.2 kg)  Height: 5\' 7"  (1.702 m)   Body mass index is 34.96 kg/m.  Physical Exam  GEN: appears well, no apparent distress. Head: normocephalic and atraumatic  Eyes: conjunctiva with mild pallor, sclera anicteric HEM: negative for cervical or periauricular lymphadenopathies CVS: RRR, nl s1 & s2, no murmurs, no edema RESP: no IWOB, good air movement bilaterally, CTAB GI: BS present & normal, soft, NTND MSK: no focal tenderness or notable swelling SKIN: no apparent skin lesion NEURO: alert and oiented appropriately, no gross deficits  PSYCH: euthymic mood with congruent affect    Assessment  and Plan:  1. Iron deficiency anemia, unspecified iron deficiency anemia type: it is unclear to me about the cause of his anemia at this point although I suspect it to be anemia of chronic blood loss.  He is on Brilinta and aspirin for the last 7 months.  Surprisingly, he denies melena or hematochezia.  His FOBT was also negative three days ago.  He has an iron saturation of 6% on his recent iron panel which is very low.  His point of care hemoglobin stable at 9.1 mg/dL today.  He has been started on ferrous sulfate by his  cardiologist.  Recommended continuing his ferrous sulfate. Recommended taking with orange juice. Will check CBC and reticulocyte count today. If his hemoglobin drops, we will have him come back for blood transfusion.  - CBC with Differential/Platelet - Reticulocytes  Return if symptoms worsen or fail to improve.  Mercy Riding, MD 12/29/17 Pager: [phone]

## 2017-12-29 NOTE — Patient Instructions (Signed)
It was great seeing you today! We have addressed the following issues today  Anemia: Your hemoglobin is 9.1.  We have send another blood sample for confirmation.  Someone will get in touch with you over the next couple of days if hemoglobin drops.  Continue taking your iron.  Recommend taking your iron with a cup of orange juice.  I also suggest stopping your acid reflux medication which could reduce your iron absorption.  If we did any lab work today, and the results require attention, either me or my nurse will get in touch with you. If everything is normal, you will get a letter in mail and a message via . If you don't hear from [place] in two weeks, please give [place] a call. Otherwise, we look forward to seeing you again at your next visit. If you have any questions or concerns before then, please call the clinic at [phone].  Please bring all your medications to every doctors visit  Sign up for My Chart to have easy access to your labs results, and communication with your Primary care physician.    Please check-out at the front desk before leaving the clinic.    Take Care,   Dr. Cyndia Skeeters

## 2017-12-30 ENCOUNTER — Ambulatory Visit (HOSPITAL_COMMUNITY): Admit: 2017-12-30 | Payer: PPO | Admitting: Interventional Cardiology

## 2017-12-30 ENCOUNTER — Encounter (HOSPITAL_COMMUNITY): Payer: Self-pay

## 2017-12-30 LAB — CBC WITH DIFFERENTIAL/PLATELET
BASOS: 0 %
Basophils Absolute: 0 10*3/uL (ref 0.0–0.2)
EOS (ABSOLUTE): 0.4 10*3/uL (ref 0.0–0.4)
EOS: 4 %
HEMATOCRIT: 31.2 % — AB (ref 37.5–51.0)
Hemoglobin: 9.6 g/dL — ABNORMAL LOW (ref 13.0–17.7)
IMMATURE GRANS (ABS): 0.1 10*3/uL (ref 0.0–0.1)
IMMATURE GRANULOCYTES: 1 %
Lymphocytes Absolute: 2.1 10*3/uL (ref 0.7–3.1)
Lymphs: 21 %
MCH: 24 pg — ABNORMAL LOW (ref 26.6–33.0)
MCHC: 30.8 g/dL — ABNORMAL LOW (ref 31.5–35.7)
MCV: 78 fL — AB (ref 79–97)
MONOS ABS: 0.8 10*3/uL (ref 0.1–0.9)
Monocytes: 8 %
NEUTROS PCT: 66 %
Neutrophils Absolute: 6.5 10*3/uL (ref 1.4–7.0)
Platelets: 237 10*3/uL (ref 150–379)
RBC: 4 x10E6/uL — ABNORMAL LOW (ref 4.14–5.80)
RDW: 15.5 % — AB (ref 12.3–15.4)
WBC: 9.9 10*3/uL (ref 3.4–10.8)

## 2017-12-30 LAB — RETICULOCYTES: Retic Ct Pct: 3.9 % — ABNORMAL HIGH (ref 0.6–2.6)

## 2017-12-30 SURGERY — LEFT HEART CATH AND CORS/GRAFTS ANGIOGRAPHY
Anesthesia: LOCAL

## 2017-12-31 NOTE — Telephone Encounter (Signed)
Pt was seen by [facility] on 12/29/17. Levelle Edelen, Salome Spotted, CMA

## 2018-01-12 ENCOUNTER — Encounter: Payer: Self-pay | Admitting: Physician Assistant

## 2018-01-12 ENCOUNTER — Ambulatory Visit: Payer: PPO | Admitting: Physician Assistant

## 2018-01-12 VITALS — BP 100/58 | HR 60 | Ht 67.0 in | Wt 219.8 lb

## 2018-01-12 DIAGNOSIS — I25709 Atherosclerosis of coronary artery bypass graft(s), unspecified, with unspecified angina pectoris: Secondary | ICD-10-CM

## 2018-01-12 DIAGNOSIS — D509 Iron deficiency anemia, unspecified: Secondary | ICD-10-CM

## 2018-01-12 DIAGNOSIS — E785 Hyperlipidemia, unspecified: Secondary | ICD-10-CM

## 2018-01-12 DIAGNOSIS — I10 Essential (primary) hypertension: Secondary | ICD-10-CM

## 2018-01-12 NOTE — Patient Instructions (Signed)
Medication Instructions:  Your physician recommends that you continue on your current medications as directed. Please refer to the Current Medication list given to you today.   Labwork: TODAY:  CBC  Testing/Procedures: None ordeerd  Follow-Up: Your physician recommends that you schedule a follow-up appointment in: 2-3 [place]   Any Other Special Instructions Will Be Listed Below (If Applicable).     If you need a refill on your cardiac medications before your next appointment, please call your pharmacy.

## 2018-01-12 NOTE — Progress Notes (Signed)
Cardiology Office Note:    Date:  01/12/2018   ID:  Joshua [name]., DOB September 16, 1941, MRN [id number]  PCP:  Dickie La, MD  Cardiologist:  Sinclair Grooms, MD  GI:  Dr. Cristina Gong  Referring MD: Dickie La, MD   Chief Complaint  Patient presents with  . Follow-up    CAD    History of Present Illness:    Joshua [name]. is a 77 y.o. male with a hx of coronary artery disease status post bypass surgery in 1990 and subsequent PCI to the SVG-RCA with a bare-metal stent in 2011.  He was admitted in July 2018 with non-ST elevation myocardial infarction and underwent PCI with a DES x2 to the SVG-RCA.  I saw him December 23, 2017 with complaints of chest discomfort and shortness of breath with exertion.  I set him up for a cardiac catheterization.  However, labs returned demonstrating anemia with a hemoglobin of 10. We decided to cancel his heart catheterization due to his anemia.  Repeat hemoglobin dropped further to 9 and he was sent to the emergency room.  Fecal occult blood testing was negative.  He has seen primary care.  Hemoglobin has remained stable.  He is currently being treated with iron therapy.  Colonoscopy last done February 2015.    [name] returns for follow-up.  He is here today with his wife, Joshua [name].  Since last seen, he has been doing well.  He has not had any further chest discomfort.  His breathing is improved.  He has not seen any bleeding.  He has not had fatigue or syncope.  Prior CV studies:   The following studies were reviewed today:  Cardiac catheterization 06/24/17 LAD ostial 100 RI 100 LCx ostial 100 RCA ostial 100 SVG-D1 occluded LIMA-OM2 patent RIMA-distal LAD patent SVG-distal RCA stent at origin 52 ISR, mid stent 99 ISR, distal 40 EF 55-65 PCI: 4 x 12 mm Resolute Onyx DES to the ostial SVG-RCA; 3.5 x 15 mm Resolute Onyx DES to the mid SVG-RCA   Echocardiogram 02/26/17 EF 55-60, normal wall motion, mild MR, mild LAE, PASP 43  Past Medical  History:  Diagnosis Date  . Coronary artery disease involving native coronary artery of native heart with angina pectoris ([place])    a. 1990 s/p CABG x 4 (RIMA->LAD, LIMA->LCX, VG->Diag, VG->RCA); b. 2011 s/p BMS to VG->RCA.; c. Unstable Angina 05/2017: Patent RIMA-LAD & LIMA-LCx, ostSVG-RCA 65% ISR & mSVG-RCA  99% - DES PCI to both  . GERD (gastroesophageal reflux disease)   . History of echocardiogram    a. 01/2017 Echo: EF 55-60%, no rwma, mild MR, midlly dil LA, PASP 70mmHg.  Marland Kitchen History of hiatal hernia   . Hyperlipidemia   . Hypertension   . Obesity     Past Surgical History:  Procedure Laterality Date  . CATARACT EXTRACTION W/ INTRAOCULAR LENS  IMPLANT, BILATERAL Bilateral   . CORONARY ANGIOPLASTY WITH STENT PLACEMENT  2011   "RCA"  . CORONARY ARTERY BYPASS GRAFT  1990   CABG X4  . CORONARY STENT INTERVENTION N/A 06/24/2017   Procedure: Coronary Stent Intervention;  Surgeon: Belva Crome, MD;  Location: [facility];  Service: Cardiovascular: Ostial 4.0 x 12 mm Onyx DES reducing 70% stenosis to less than 40%. Distal body 3.5 x 15 Onyx DES reducing 99% stenosis to less than 30%.  Marland Kitchen LEFT HEART CATH AND CORS/GRAFTS ANGIOGRAPHY N/A 06/24/2017   Procedure: Left Heart Cath and Cors/Grafts Angiography;  Surgeon: Belva Crome, MD;  Location: [facility];  Service: Cardiovascular:  CTO native RCA, LAD & LCx (Graft Dependent). Patent LIMA-LCx & RIMA-LAD.  SVG-RCA: ost 65% ISR & mid 99% --> DES PCI to both    Current Medications: Current Meds  Medication Sig  . amitriptyline (ELAVIL) 25 MG tablet Take 25 mg by mouth at bedtime.  Marland Kitchen aspirin EC 81 MG EC tablet Take 1 tablet (81 mg total) by mouth daily.  Marland Kitchen atenolol-chlorthalidone (TENORETIC) 100-25 MG tablet TAKE 1 TABLET BY MOUTH EVERY DAY  . cetirizine (ZYRTEC) 10 MG tablet Take 10 mg by mouth daily.  . citalopram (CELEXA) 20 MG tablet Take 1 tablet (20 mg total) by mouth daily.  Marland Kitchen esomeprazole (NEXIUM) 20 MG capsule Take 20 mg  by mouth daily.  Marland Kitchen ezetimibe (ZETIA) 10 MG tablet Take 10 mg by mouth daily.  . ferrous sulfate 325 (65 FE) MG tablet Take 1 tablet (325 mg total) by mouth 3 (three) times daily with meals.  . gabapentin (NEURONTIN) 100 MG capsule Take 500 mg by mouth in the morning and 400 mg by mouth int the afternoon and 500 mg by mouth at bedtime  . isosorbide mononitrate (IMDUR) 30 MG 24 hr tablet Take 1 tablet (30 mg total) by mouth daily.  . Multiple Vitamins-Minerals (CENTRUM SILVER 50+MEN) TABS Take 1 tablet by mouth daily.  . nitroGLYCERIN (NITROSTAT) 0.4 MG SL tablet Place 1 tablet (0.4 mg total) under the tongue every 5 (five) minutes x 3 doses as needed for chest pain.  . simvastatin (ZOCOR) 40 MG tablet Take 40 mg by mouth daily.  . ticagrelor (BRILINTA) 90 MG TABS tablet Take 1 tablet (90 mg total) by mouth 2 (two) times daily.  . vitamin B-12 (CYANOCOBALAMIN) 1000 MCG tablet Take 1,000 mcg by mouth daily.     Allergies:   Ace inhibitors; Atorvastatin; Rosuvastatin; Plavix [clopidogrel bisulfate]; Sulfa antibiotics; and Sulfonamide derivatives   Social History   Tobacco Use  . Smoking status: Never Smoker  . Smokeless tobacco: Former Systems developer    Types: Chew  Substance Use Topics  . Alcohol use: Yes    Alcohol/week: 0.0 oz    Comment: 06/23/2017 "a few drinks/ month  . Drug use: No     Family Hx: The patient's family history includes Cancer in his mother; Coronary artery disease in his father; Heart attack in his father; Hyperlipidemia in his son; Hypertension in his son.  ROS:   Please see the history of present illness.    ROS All other systems reviewed and are negative.   EKGs/Labs/Other Test Reviewed:    EKG:  EKG is not ordered today.    Recent Labs: 12/26/2017: ALT 24; BUN 20; Creatinine, Ser 1.01; Potassium 4.1; Sodium 136 12/29/2017: Hemoglobin 9.6; Platelets 237   Recent Lipid Panel Lab Results  Component Value Date/Time   CHOL 136 10/06/2017 07:40 AM   TRIG 151 (H)  10/06/2017 07:40 AM   HDL 27 (L) 10/06/2017 07:40 AM   CHOLHDL 5.0 10/06/2017 07:40 AM   CHOLHDL 5.6 12/14/2014 11:47 AM   LDLCALC 79 10/06/2017 07:40 AM   LDLDIRECT 84 05/14/2016 12:34 PM    Physical Exam:    VS:  BP (!) 100/58   Pulse 60   Ht 5\' 7"  (1.702 m)   Wt 219 lb 12.8 oz (99.7 kg)   SpO2 98%   BMI 34.43 kg/m     Wt Readings from Last 3 Encounters:  01/12/18 219 lb 12.8 oz (  99.7 kg)  12/29/17 223 lb 3.2 oz (101.2 kg)  12/23/17 223 lb (101.2 kg)     Physical Exam  Constitutional: He is oriented to person, place, and time. He appears well-developed and well-nourished. No distress.  HENT:  Head: Normocephalic and atraumatic.  Neck: No JVD present.  Cardiovascular: Normal rate and regular rhythm.  No murmur heard. Pulmonary/Chest: Effort normal. He has no rales.  Abdominal: Soft.  Musculoskeletal: He exhibits no edema.  Neurological: He is alert and oriented to person, place, and time.  Skin: Skin is warm and dry.    ASSESSMENT & PLAN:    1.  Coronary artery disease History of bypass surgery 1990, PCI to the SVG-RCA in 2011 and drug-eluting stent x2 to the SVG-RCA in July 2018.  He was recently set up for cardiac catheterization due to development of angina.  However, this was canceled secondary to anemia.  He is now on long-acting nitrates.  His hemoglobin is stabilized.  He is feeling better with improved breathing and without further chest discomfort.  I reviewed his case today with Dr. Tamala Julian.  Given his graft disease and age of his grafts, we prefer to keep him on dual antiplatelet therapy for a minimum of 12 months post PCI.  -Continue aspirin, Brilinta, beta-blocker, nitrates, statin.  2.  Essential hypertension The patient's blood pressure is controlled on his current regimen.  Continue current therapy.   3.  Hyperlipidemia with target low density lipoprotein (LDL) cholesterol less than 70 mg/dL LDL optimal on most recent lab work.  Continue current Rx.     4.  Iron deficiency anemia, unspecified iron deficiency anemia type   - Plan: CBC    Dispo:  Return in about 3 months (around 04/11/2018) for Routine Follow Up w/ Dr. Tamala Julian.   Medication Adjustments/Labs and Tests Ordered: Current medicines are reviewed at length with the patient today.  Concerns regarding medicines are outlined above.  Tests Ordered: Orders Placed This Encounter  Procedures  . CBC   Medication Changes: No orders of the defined types were placed in this encounter.   Signed, Richardson Dopp, PA-C  01/12/2018 11:20 AM    [facility] [address], [place], [place]  [address] Phone: [phone]; Fax: [phone]

## 2018-01-13 LAB — CBC
HEMATOCRIT: 38.7 % (ref 37.5–51.0)
Hemoglobin: 12.3 g/dL — ABNORMAL LOW (ref 13.0–17.7)
MCH: 26.2 pg — AB (ref 26.6–33.0)
MCHC: 31.8 g/dL (ref 31.5–35.7)
MCV: 83 fL (ref 79–97)
Platelets: 242 10*3/uL (ref 150–379)
RBC: 4.69 x10E6/uL (ref 4.14–5.80)
RDW: 23.8 % — ABNORMAL HIGH (ref 12.3–15.4)
WBC: 8.1 10*3/uL (ref 3.4–10.8)

## 2018-01-18 ENCOUNTER — Other Ambulatory Visit: Payer: Self-pay | Admitting: Family Medicine

## 2018-01-28 ENCOUNTER — Ambulatory Visit: Payer: PPO | Admitting: Interventional Cardiology

## 2018-02-19 ENCOUNTER — Other Ambulatory Visit: Payer: Self-pay | Admitting: Family Medicine

## 2018-04-13 NOTE — Progress Notes (Signed)
Cardiology Office Note    Date:  04/14/2018   ID:  Joshua Kirk., DOB August 03, 1941, MRN [id number]  PCP:  Dickie La, MD  Cardiologist: Sinclair Grooms, MD   Chief Complaint  Patient presents with  . Coronary Artery Disease  . Anemia    History of Present Illness:  Joshua T Andrea Colglazier. is a 77 y.o. male with a hx of coronary artery disease status post bypass surgery in 1990 and subsequent PCI to the SVG-RCA with a bare-metal stent in 2011. He was admitted in July 2018 with non-ST elevation myocardial infarction and underwent PCI with a DES x2 to the SVG-RCA. Seen Feb. 2019 with angina and noted to be anemic due to  Minimal angina.  Mild dyspnea on exertion.  As hemoglobin has returned to near normal, anginal complaints with the addition of Imdur have essentially gone away.  He denies orthopnea, PND, and sublingual nitroglycerin use.   Past Medical History:  Diagnosis Date  . Coronary artery disease involving native coronary artery of native heart with angina pectoris ([place])    a. 1990 s/p CABG x 4 (RIMA->LAD, LIMA->LCX, VG->Diag, VG->RCA); b. 2011 s/p BMS to VG->RCA.; c. Unstable Angina 05/2017: Patent RIMA-LAD & LIMA-LCx, ostSVG-RCA 65% ISR & mSVG-RCA  99% - DES PCI to both  . GERD (gastroesophageal reflux disease)   . History of echocardiogram    a. 01/2017 Echo: EF 55-60%, no rwma, mild MR, midlly dil LA, PASP 19mmHg.  Marland Kitchen History of hiatal hernia   . Hyperlipidemia   . Hypertension   . Obesity     Past Surgical History:  Procedure Laterality Date  . CATARACT EXTRACTION W/ INTRAOCULAR LENS  IMPLANT, BILATERAL Bilateral   . CORONARY ANGIOPLASTY WITH STENT PLACEMENT  2011   "RCA"  . CORONARY ARTERY BYPASS GRAFT  1990   CABG X4  . CORONARY STENT INTERVENTION N/A 06/24/2017   Procedure: Coronary Stent Intervention;  Surgeon: Belva Crome, MD;  Location: [facility];  Service: Cardiovascular: Ostial 4.0 x 12 mm Onyx DES reducing 70% stenosis to less than 40%.  Distal body 3.5 x 15 Onyx DES reducing 99% stenosis to less than 30%.  Marland Kitchen LEFT HEART CATH AND CORS/GRAFTS ANGIOGRAPHY N/A 06/24/2017   Procedure: Left Heart Cath and Cors/Grafts Angiography;  Surgeon: Belva Crome, MD;  Location: [facility];  Service: Cardiovascular:  CTO native RCA, LAD & LCx (Graft Dependent). Patent LIMA-LCx & RIMA-LAD.  SVG-RCA: ost 65% ISR & mid 99% --> DES PCI to both    Current Medications: Outpatient Medications Prior to Visit  Medication Sig Dispense Refill  . aspirin EC 81 MG EC tablet Take 1 tablet (81 mg total) by mouth daily. 30 tablet 0  . atenolol-chlorthalidone (TENORETIC) 100-25 MG tablet TAKE 1 TABLET BY MOUTH EVERY DAY 90 tablet 3  . cetirizine (ZYRTEC) 10 MG tablet Take 10 mg by mouth daily.    . citalopram (CELEXA) 20 MG tablet Take 1 tablet (20 mg total) by mouth daily. 90 tablet 3  . esomeprazole (NEXIUM) 20 MG capsule Take 20 mg by mouth daily.    Marland Kitchen ezetimibe (ZETIA) 10 MG tablet Take 10 mg by mouth daily.    . ferrous sulfate 325 (65 FE) MG tablet Take 325 mg by mouth 2 (two) times daily with a meal.    . gabapentin (NEURONTIN) 100 MG capsule Take 100 mg by mouth 2 (two) times daily. Take with 300 mg to equal 400 mg  by mouth each morning and evening.    . gabapentin (NEURONTIN) 300 MG capsule Take with 100 mg to equal 400 mg by mouth each morning and evening. Take one (1) capsule (300 mg) by mouth midday.    . isosorbide mononitrate (IMDUR) 30 MG 24 hr tablet Take 30 mg by mouth daily.    . nitroGLYCERIN (NITROSTAT) 0.4 MG SL tablet Place 1 tablet (0.4 mg total) under the tongue every 5 (five) minutes x 3 doses as needed for chest pain. 25 tablet 2  . simvastatin (ZOCOR) 40 MG tablet Take 40 mg by mouth daily.    . ticagrelor (BRILINTA) 90 MG TABS tablet Take 1 tablet (90 mg total) by mouth 2 (two) times daily. 60 tablet 10  . amitriptyline (ELAVIL) 25 MG tablet Take 25 mg by mouth at bedtime.    . citalopram (CELEXA) 20 MG tablet TAKE 1  TABLET(20 MG) BY MOUTH DAILY (Patient not taking: Reported on 04/14/2018) 90 tablet 0  . ezetimibe (ZETIA) 10 MG tablet TAKE 1 TABLET(10 MG) BY MOUTH DAILY (Patient not taking: Reported on 04/14/2018) 90 tablet 0  . ferrous sulfate 325 (65 FE) MG tablet Take 1 tablet (325 mg total) by mouth 3 (three) times daily with meals. (Patient not taking: Reported on 04/14/2018) 90 tablet 0  . gabapentin (NEURONTIN) 100 MG capsule Take 500 mg by mouth in the morning and 400 mg by mouth int the afternoon and 500 mg by mouth at bedtime    . gabapentin (NEURONTIN) 300 MG capsule TAPER UP TO FOUR TIMES DAILY AS DIRECTED (Patient not taking: Reported on 04/14/2018) 120 capsule 0  . gabapentin (NEURONTIN) 300 MG capsule TAKE UP TO 3 CAPSULES THREE TIMES DAILY AS DIRECTED (Patient not taking: Reported on 04/14/2018) 270 capsule 0  . isosorbide mononitrate (IMDUR) 30 MG 24 hr tablet Take 1 tablet (30 mg total) by mouth daily. 90 tablet 3  . Multiple Vitamins-Minerals (CENTRUM SILVER 50+MEN) TABS Take 1 tablet by mouth daily.    . vitamin B-12 (CYANOCOBALAMIN) 1000 MCG tablet Take 1,000 mcg by mouth daily.     No facility-administered medications prior to visit.      Allergies:   Ace inhibitors; Atorvastatin; Rosuvastatin; Plavix [clopidogrel bisulfate]; Sulfa antibiotics; and Sulfonamide derivatives   Social History   Socioeconomic History  . Marital status: Married    Spouse name: Not on file  . Number of children: Not on file  . Years of education: Not on file  . Highest education level: Not on file  Occupational History  . Occupation: retired    Comment: Retail buyer (truck driver, heating and air)  Social Needs  . Financial resource strain: Not on file  . Food insecurity:    Worry: Not on file    Inability: Not on file  . Transportation needs:    Medical: Not on file    Non-medical: Not on file  Tobacco Use  . Smoking status: Never Smoker  . Smokeless tobacco: Former Systems developer    Types: Chew  Substance  and Sexual Activity  . Alcohol use: Yes    Alcohol/week: 0.0 oz    Comment: 06/23/2017 "a few drinks/ month  . Drug use: No  . Sexual activity: Not Currently  Lifestyle  . Physical activity:    Days per week: Not on file    Minutes per session: Not on file  . Stress: Not on file  Relationships  . Social connections:    Talks on phone: Not on file  Gets together: Not on file    Attends religious service: Not on file    Active member of club or organization: Not on file    Attends meetings of clubs or organizations: Not on file    Relationship status: Not on file  Other Topics Concern  . Not on file  Social History Narrative   Lives locally with his wife Joshua [name] - former [employer] ECG tech).  Does not routinely exercise.     Family History:  The patient's family history includes Cancer in his mother; Coronary artery disease in his father; Heart attack in his father; Hyperlipidemia in his son; Hypertension in his son.   ROS:   Please see the history of present illness.    Denies melena.  No hemoptysis or hematochezia.  No medication side effects.  Some dyspnea when he squats or bends over at the waist. All other systems reviewed and are negative.   PHYSICAL EXAM:   VS:  BP 126/64   Pulse (!) 53   Ht 5\' 8"  (1.727 m)   Wt 221 lb 3.2 oz (100.3 kg)   BMI 33.63 kg/m    GEN: Well nourished, well developed, in no acute distress  HEENT: normal  Neck: no JVD, carotid bruits, or masses Cardiac: RRR; no murmurs, rubs, or gallops,no edema  Respiratory:  clear to auscultation bilaterally, normal work of breathing GI: Distended abdomen related to obesity. MS: no deformity or atrophy  Skin: warm and dry, no rash Neuro:  Alert and Oriented x 3, Strength and sensation are intact Psych: euthymic mood, full affect  Wt Readings from Last 3 Encounters:  04/14/18 221 lb 3.2 oz (100.3 kg)  01/12/18 219 lb 12.8 oz (99.7 kg)  12/29/17 223 lb 3.2 oz (101.2 kg)      Studies/Labs Reviewed:    EKG:  EKG not repeated  Recent Labs: 12/26/2017: ALT 24; BUN 20; Creatinine, Ser 1.01; Potassium 4.1; Sodium 136 01/12/2018: Hemoglobin 12.3; Platelets 242   Lipid Panel    Component Value Date/Time   CHOL 136 10/06/2017 0740   TRIG 151 (H) 10/06/2017 0740   HDL 27 (L) 10/06/2017 0740   CHOLHDL 5.0 10/06/2017 0740   CHOLHDL 5.6 12/14/2014 1147   VLDL 46 (H) 12/14/2014 1147   LDLCALC 79 10/06/2017 0740   LDLDIRECT 84 05/14/2016 1234    Additional studies/ records that were reviewed today include:  No new data.  GI work-up was not performed.  Aspirin was started.    ASSESSMENT:    1. Coronary artery disease involving coronary bypass graft of native heart with angina pectoris ([place])   2. Hyperlipidemia with target low density lipoprotein (LDL) cholesterol less than 70 mg/dL   3. Essential hypertension   4. Iron deficiency anemia due to chronic blood loss      PLAN:  In order of problems listed above:  1. Stable with stable class II angina.  Continue close clinical observation. 2. LDL target less than 70 3. Blood pressure target 130/80 mmHg or less. 4. Continue iron supplementation.  Being followed by primary care.  Clinical follow-up with team member or myself in 6 to 9 months.  Earlier if recurrent angina in the absence of anemia.  Medication Adjustments/Labs and Tests Ordered: Current medicines are reviewed at length with the patient today.  Concerns regarding medicines are outlined above.  Medication changes, Labs and Tests ordered today are listed in the Patient Instructions below. Patient Instructions  Medication Instructions:  Your physician recommends that you continue on  your current medications as directed. Please refer to the Current Medication list given to you today.  Labwork: None  Testing/Procedures: None  Follow-Up: Your physician wants you to follow-up in: 6-9 months with Dr. Tamala Julian or an APP.  You will receive a reminder letter in the mail two  months in advance. If you don't receive a letter, please call our office to schedule the follow-up appointment.   Any Other Special Instructions Will Be Listed Below (If Applicable).     If you need a refill on your cardiac medications before your next appointment, please call your pharmacy.      Signed, Sinclair Grooms, MD  04/14/2018 11:07 AM    [facility] [address], [place], [place]  [address] Phone: [phone]; Fax: [phone]

## 2018-04-14 ENCOUNTER — Ambulatory Visit: Payer: PPO | Admitting: Interventional Cardiology

## 2018-04-14 ENCOUNTER — Encounter: Payer: Self-pay | Admitting: Interventional Cardiology

## 2018-04-14 VITALS — BP 126/64 | HR 53 | Ht 68.0 in | Wt 221.2 lb

## 2018-04-14 DIAGNOSIS — I25709 Atherosclerosis of coronary artery bypass graft(s), unspecified, with unspecified angina pectoris: Secondary | ICD-10-CM

## 2018-04-14 DIAGNOSIS — D500 Iron deficiency anemia secondary to blood loss (chronic): Secondary | ICD-10-CM | POA: Diagnosis not present

## 2018-04-14 DIAGNOSIS — I10 Essential (primary) hypertension: Secondary | ICD-10-CM | POA: Diagnosis not present

## 2018-04-14 DIAGNOSIS — E785 Hyperlipidemia, unspecified: Secondary | ICD-10-CM

## 2018-04-14 NOTE — Patient Instructions (Signed)
Medication Instructions:  Your physician recommends that you continue on your current medications as directed. Please refer to the Current Medication list given to you today.  Labwork: None  Testing/Procedures: None  Follow-Up: Your physician wants you to follow-up in: 6-9 months with Dr. Tamala Julian or an APP.  You will receive a reminder letter in the mail two months in advance. If you don't receive a letter, please call our office to schedule the follow-up appointment.   Any Other Special Instructions Will Be Listed Below (If Applicable).     If you need a refill on your cardiac medications before your next appointment, please call your pharmacy.

## 2018-06-05 ENCOUNTER — Other Ambulatory Visit: Payer: Self-pay | Admitting: Family Medicine

## 2018-06-15 ENCOUNTER — Encounter: Payer: Self-pay | Admitting: Interventional Cardiology

## 2018-06-17 ENCOUNTER — Other Ambulatory Visit: Payer: Self-pay | Admitting: *Deleted

## 2018-06-30 ENCOUNTER — Ambulatory Visit (INDEPENDENT_AMBULATORY_CARE_PROVIDER_SITE_OTHER): Payer: PPO | Admitting: Family Medicine

## 2018-06-30 ENCOUNTER — Encounter: Payer: Self-pay | Admitting: Family Medicine

## 2018-06-30 ENCOUNTER — Other Ambulatory Visit: Payer: Self-pay

## 2018-06-30 DIAGNOSIS — I25709 Atherosclerosis of coronary artery bypass graft(s), unspecified, with unspecified angina pectoris: Secondary | ICD-10-CM

## 2018-06-30 DIAGNOSIS — G5602 Carpal tunnel syndrome, left upper limb: Secondary | ICD-10-CM | POA: Diagnosis not present

## 2018-06-30 DIAGNOSIS — R454 Irritability and anger: Secondary | ICD-10-CM

## 2018-06-30 DIAGNOSIS — B0229 Other postherpetic nervous system involvement: Secondary | ICD-10-CM

## 2018-06-30 MED ORDER — GABAPENTIN 400 MG PO CAPS: 400.0000 mg | ORAL_CAPSULE | Freq: Four times a day (QID) | ORAL | 3 refills | Status: DC

## 2018-06-30 MED ORDER — EZETIMIBE 10 MG PO TABS: ORAL_TABLET | ORAL | 3 refills | Status: DC

## 2018-07-01 DIAGNOSIS — G5600 Carpal tunnel syndrome, unspecified upper limb: Secondary | ICD-10-CM | POA: Insufficient documentation

## 2018-07-01 NOTE — Assessment & Plan Note (Signed)
Much more stable on current citalopram and will continue.

## 2018-07-01 NOTE — Assessment & Plan Note (Signed)
Increased numbness and tingling in the last few weeks.  Mostly associated with feeding his grand child the bottle so I think this is positional demand.  His wife tells me he has had intermittent problems with this for years.  He has a cock-up splint at home but has not been wearing it.  Recommend nighttime wear for the next 4 to 6 weeks and if he is not improving or symptoms worsen, he is to let me know.  Otherwise I will have him follow-up in 3 months.

## 2018-07-01 NOTE — Progress Notes (Signed)
    CHIEF COMPLAINT / HPI: #1.  Hypertension: Coronary artery disease: Doing well on current medications.  Needs some refills.  No chest pain.  No shortness of breath.  The level stable. 2.  Continues to have problems with postherpetic neuralgia.  Had some questions about tapering off work tapering way down on the gabapentin as he is concerned about taking it long-term.  He has no side effects from it currently. 3.  Currently on citalopram for mood instability and he is been doing well with that.  No problems. 4.  Left hand numbness.  His wife tells me that he has previously been diagnosed with carpal tunnel but has only had problems intermittently.  In the last month he has been doing some work with his grandchild, feeding him bottle etc.  If these times he notices that he will have occasional numbness in his left hand.  Sounds like it is positional related to feeding the baby.  REVIEW OF SYSTEMS: See HPI.  PERTINENT  PMH / PSH: I have reviewed the patient's medications, allergies, past medical and surgical history, smoking status and updated in the EMR as appropriate.   OBJECTIVE:  Vital signs reviewed. GENERAL: Well-developed, well-nourished, no acute distress. CARDIOVASCULAR: Regular rate and rhythm no murmur gallop or rub LUNGS: Clear to auscultation bilaterally, no rales or wheeze. ABDOMEN: Soft positive bowel sounds NEURO: No gross focal neurological deficits except some hyperesthesia in the right facial area secondary to his previous known herpetic lesions. HEENT: Neck is without lymphadenopathy.  Conjunctive are nonicteric.  Eyes and eyelids are without lesion. MSK: Movement of extremity x 4.    ASSESSMENT / PLAN:  CAD (coronary artery disease) Blood pressure is well controlled.  He is currently asymptomatic.  Labs.  Continue current medications.  He has yearly follow-up with cardiology.  Irritability and anger Much more stable on current citalopram and will  continue.  Postherpetic neuralgia Discussed his gabapentin dosing at length.  He has tapered himself down to 400 mg 3 times daily but still has some symptoms.  Discussed long-term gabapentin I would like him to take her to dose for his ideally most comfortable prescription is refilled.  Can take as much as to what night 1 in the AM and 1 at lunch milligrams a day current prescription.  If there is not enough to moderate his symptoms, he will let me know  Carpal tunnel syndrome Increased numbness and tingling in the last few weeks.  Mostly associated with feeding his grand child the bottle so I think this is positional demand.  His wife tells me he has had intermittent problems with this for years.  He has a cock-up splint at home but has not been wearing it.  Recommend nighttime wear for the next 4 to 6 weeks and if he is not improving or symptoms worsen, he is to let me know.  Otherwise I will have him follow-up in 3 months.

## 2018-07-01 NOTE — Assessment & Plan Note (Signed)
Blood pressure is well controlled.  He is currently asymptomatic.  Labs.  Continue current medications.  He has yearly follow-up with cardiology.

## 2018-07-01 NOTE — Assessment & Plan Note (Signed)
Discussed his gabapentin dosing at length.  He has tapered himself down to 400 mg 3 times daily but still has some symptoms.  Discussed long-term gabapentin I would like him to take her to dose for his ideally most comfortable prescription is refilled.  Can take as much as to what night 1 in the AM and 1 at lunch milligrams a day current prescription.  If there is not enough to moderate his symptoms, he will let me know

## 2018-09-06 ENCOUNTER — Other Ambulatory Visit: Payer: Self-pay | Admitting: Family Medicine

## 2018-12-06 ENCOUNTER — Other Ambulatory Visit: Payer: Self-pay | Admitting: Interventional Cardiology

## 2018-12-06 MED ORDER — NITROGLYCERIN 0.4 MG SL SUBL: 0.4000 mg | SUBLINGUAL_TABLET | SUBLINGUAL | 1 refills | Status: DC | PRN

## 2018-12-06 MED ORDER — ISOSORBIDE MONONITRATE ER 30 MG PO TB24: 30.0000 mg | ORAL_TABLET | Freq: Every day | ORAL | 1 refills | Status: DC

## 2018-12-07 ENCOUNTER — Other Ambulatory Visit: Payer: Self-pay | Admitting: *Deleted

## 2018-12-07 MED ORDER — EZETIMIBE 10 MG PO TABS: 10.0000 mg | ORAL_TABLET | Freq: Every day | ORAL | 3 refills | Status: DC

## 2018-12-07 MED ORDER — ATENOLOL-CHLORTHALIDONE 100-25 MG PO TABS: 1.0000 | ORAL_TABLET | Freq: Every day | ORAL | 3 refills | Status: DC

## 2018-12-07 MED ORDER — CITALOPRAM HYDROBROMIDE 20 MG PO TABS: ORAL_TABLET | ORAL | 3 refills | Status: DC

## 2018-12-07 MED ORDER — ESOMEPRAZOLE MAGNESIUM 20 MG PO CPDR: 20.0000 mg | DELAYED_RELEASE_CAPSULE | Freq: Every day | ORAL | 3 refills | Status: AC

## 2018-12-07 MED ORDER — SIMVASTATIN 40 MG PO TABS: 40.0000 mg | ORAL_TABLET | Freq: Every day | ORAL | 3 refills | Status: DC

## 2018-12-07 MED ORDER — GABAPENTIN 400 MG PO CAPS: 400.0000 mg | ORAL_CAPSULE | Freq: Four times a day (QID) | ORAL | 3 refills | Status: DC

## 2019-02-22 ENCOUNTER — Other Ambulatory Visit: Payer: Self-pay | Admitting: Interventional Cardiology

## 2019-04-20 ENCOUNTER — Ambulatory Visit (INDEPENDENT_AMBULATORY_CARE_PROVIDER_SITE_OTHER): Payer: Medicare HMO | Admitting: Family Medicine

## 2019-04-20 ENCOUNTER — Encounter: Payer: Self-pay | Admitting: Family Medicine

## 2019-04-20 ENCOUNTER — Other Ambulatory Visit: Payer: Self-pay

## 2019-04-20 VITALS — BP 132/70 | HR 57

## 2019-04-20 DIAGNOSIS — I25709 Atherosclerosis of coronary artery bypass graft(s), unspecified, with unspecified angina pectoris: Secondary | ICD-10-CM

## 2019-04-20 DIAGNOSIS — R454 Irritability and anger: Secondary | ICD-10-CM

## 2019-04-20 DIAGNOSIS — E785 Hyperlipidemia, unspecified: Secondary | ICD-10-CM

## 2019-04-20 DIAGNOSIS — Z125 Encounter for screening for malignant neoplasm of prostate: Secondary | ICD-10-CM | POA: Diagnosis not present

## 2019-04-20 DIAGNOSIS — B0229 Other postherpetic nervous system involvement: Secondary | ICD-10-CM | POA: Diagnosis not present

## 2019-04-20 DIAGNOSIS — G2581 Restless legs syndrome: Secondary | ICD-10-CM

## 2019-04-20 DIAGNOSIS — I10 Essential (primary) hypertension: Secondary | ICD-10-CM

## 2019-04-20 MED ORDER — ALPRAZOLAM 0.5 MG PO TABS: 0.5000 mg | ORAL_TABLET | Freq: Every evening | ORAL | 3 refills | Status: DC | PRN

## 2019-04-20 NOTE — Patient Instructions (Signed)
I will send you a note about your labs See you in 6 m unless something comes up

## 2019-04-21 DIAGNOSIS — G2581 Restless legs syndrome: Secondary | ICD-10-CM | POA: Insufficient documentation

## 2019-04-21 LAB — COMPREHENSIVE METABOLIC PANEL
ALT: 29 IU/L (ref 0–44)
AST: 34 IU/L (ref 0–40)
Albumin/Globulin Ratio: 1.9 (ref 1.2–2.2)
Albumin: 4.2 g/dL (ref 3.7–4.7)
Alkaline Phosphatase: 58 IU/L (ref 39–117)
BUN/Creatinine Ratio: 17 (ref 10–24)
BUN: 17 mg/dL (ref 8–27)
Bilirubin Total: 0.5 mg/dL (ref 0.0–1.2)
CO2: 23 mmol/L (ref 20–29)
Calcium: 9.4 mg/dL (ref 8.6–10.2)
Chloride: 99 mmol/L (ref 96–106)
Creatinine, Ser: 0.99 mg/dL (ref 0.76–1.27)
GFR calc Af Amer: 86 mL/min/{1.73_m2} (ref >59)
GFR calc non Af Amer: 74 mL/min/{1.73_m2} (ref >59)
Globulin, Total: 2.2 g/dL (ref 1.5–4.5)
Glucose: 113 mg/dL — ABNORMAL HIGH (ref 65–99)
Potassium: 4.2 mmol/L (ref 3.5–5.2)
Sodium: 137 mmol/L (ref 134–144)
Total Protein: 6.4 g/dL (ref 6.0–8.5)

## 2019-04-21 LAB — PSA: Prostate Specific Ag, Serum: 0.8 ng/mL (ref 0.0–4.0)

## 2019-04-21 LAB — CBC
Hematocrit: 42.4 % (ref 37.5–51.0)
Hemoglobin: 14 g/dL (ref 13.0–17.7)
MCH: 28.3 pg (ref 26.6–33.0)
MCHC: 33 g/dL (ref 31.5–35.7)
MCV: 86 fL (ref 79–97)
Platelets: 172 10*3/uL (ref 150–450)
RBC: 4.95 x10E6/uL (ref 4.14–5.80)
RDW: 13.7 % (ref 11.6–15.4)
WBC: 5.8 10*3/uL (ref 3.4–10.8)

## 2019-04-21 LAB — LDL CHOLESTEROL, DIRECT: LDL Direct: 92 mg/dL (ref 0–99)

## 2019-04-21 NOTE — Assessment & Plan Note (Signed)
PSA today

## 2019-04-21 NOTE — Progress Notes (Addendum)
    CHIEF COMPLAINT / HPI: #1.  Follow-up post herpetic neuropathy: Still has some issues with numbness tingling and stabbing sensation in the scalp and right side of the face.  He is continuing to take the gabapentin and thinks that helps a lot.  Without it, he would not be able to sleep.  Does not think he wants to go up on the dose currently 2.  Cardiovascular disease: He has some shortness of breath when he leans over to tie his shoes but he can mow the lawn with a push mower.  The long front and back takes about 45 minutes to an hour.  He has no chest pain or shortness of breath with this.  He can also go up and down several flights of stairs without chest pain.  No calf pain. 3. You: His mood is been much more stable since he started the Celexa and he wants to continue that.  Says he is much easier to live with him and back says he likes himself better on it.  No side effects noted. 4.  Hypertension: Continues to take his blood pressure medicine without problem.  No headaches. 5.  Sometimes at night he is having a lot of restless leg syndrome.  He has used one half of his wife's alprazolam on couple of occasions and that really helped.  He says he has it about 2-3 times a week.  He would like to try some of that medication.  REVIEW OF SYSTEMS: See HPI  PERTINENT  PMH / PSH: I have reviewed the patient's medications, allergies, past medical and surgical history, smoking status and updated in the EMR as appropriate. CABG 2018 Postherpetic neuralgia  OBJECTIVE:  Vital signs reviewed. GENERAL: Well-developed, well-nourished, no acute distress. CARDIOVASCULAR: Regular rate and rhythm no murmur gallop or rub LUNGS: Clear to auscultation bilaterally, no rales or wheeze. ABDOMEN: Soft positive bowel sounds NEURO: No gross focal neurological deficits. MSK: Movement of extremity x 4.    ASSESSMENT / PLAN:  Hyperlipidemia with target low density lipoprotein (LDL) cholesterol less than 70  mg/dL He had eggs and sausage for breakfast so I will check LDL only today.  He continues on his statin medication without problem.  Essential hypertension Pressure is well controlled and he is having no problems with his medicine so we will continue current medication regimen  CAD (coronary artery disease) No symptoms of chest pain or exertional shortness of breath he agrees it would be best if he would lose a little weight but otherwise he seems to be doing pretty well.  Irritability and anger He is done much better on the Celexa so we will continue that indefinitely.  Special screening for malignant neoplasm of prostate PSA today  Postherpetic neuralgia Unfortunately he still has symptoms even on his current dose of gabapentin.  It sounds like it stable so he does not really want to increase dose.  I am not sure that we cannot decrease and have him continue to be comfortable.  We will continue to follow.  Fortunately right now is not having any side effects from gabapentin and he has normal kidney function although have not checked that in a while so we will check that today

## 2019-04-21 NOTE — Assessment & Plan Note (Signed)
No symptoms of chest pain or exertional shortness of breath he agrees it would be best if he would lose a little weight but otherwise he seems to be doing pretty well.

## 2019-04-21 NOTE — Assessment & Plan Note (Signed)
Unfortunately he still has symptoms even on his current dose of gabapentin.  It sounds like it stable so he does not really want to increase dose.  I am not sure that we cannot decrease and have him continue to be comfortable.  We will continue to follow.  Fortunately right now is not having any side effects from gabapentin and he has normal kidney function although have not checked that in a while so we will check that today

## 2019-04-21 NOTE — Assessment & Plan Note (Signed)
Pressure is well controlled and he is having no problems with his medicine so we will continue current medication regimen

## 2019-04-21 NOTE — Assessment & Plan Note (Signed)
He is done much better on the Celexa so we will continue that indefinitely.

## 2019-04-21 NOTE — Assessment & Plan Note (Signed)
He had eggs and sausage for breakfast so I will check LDL only today.  He continues on his statin medication without problem.

## 2019-04-21 NOTE — Assessment & Plan Note (Signed)
2-3 times a week.  He has been using some of his wife's alprazolam.  I will give him 0.5 mg nightly as needed to use 2-3 times a week to see if that improves things.

## 2019-05-05 ENCOUNTER — Encounter: Payer: Self-pay | Admitting: Family Medicine

## 2019-05-13 IMAGING — CR DG CHEST 2V
2 series · 2 of 2 positions shown · non-contrast
Comparison: [DATE], 09/03/2012.

CLINICAL DATA: [REDACTED] history of gradually increasing chest pain
and shortness of breath with exertion. Prior CABG.

EXAM:
CHEST  2 VIEW

[chest pa]
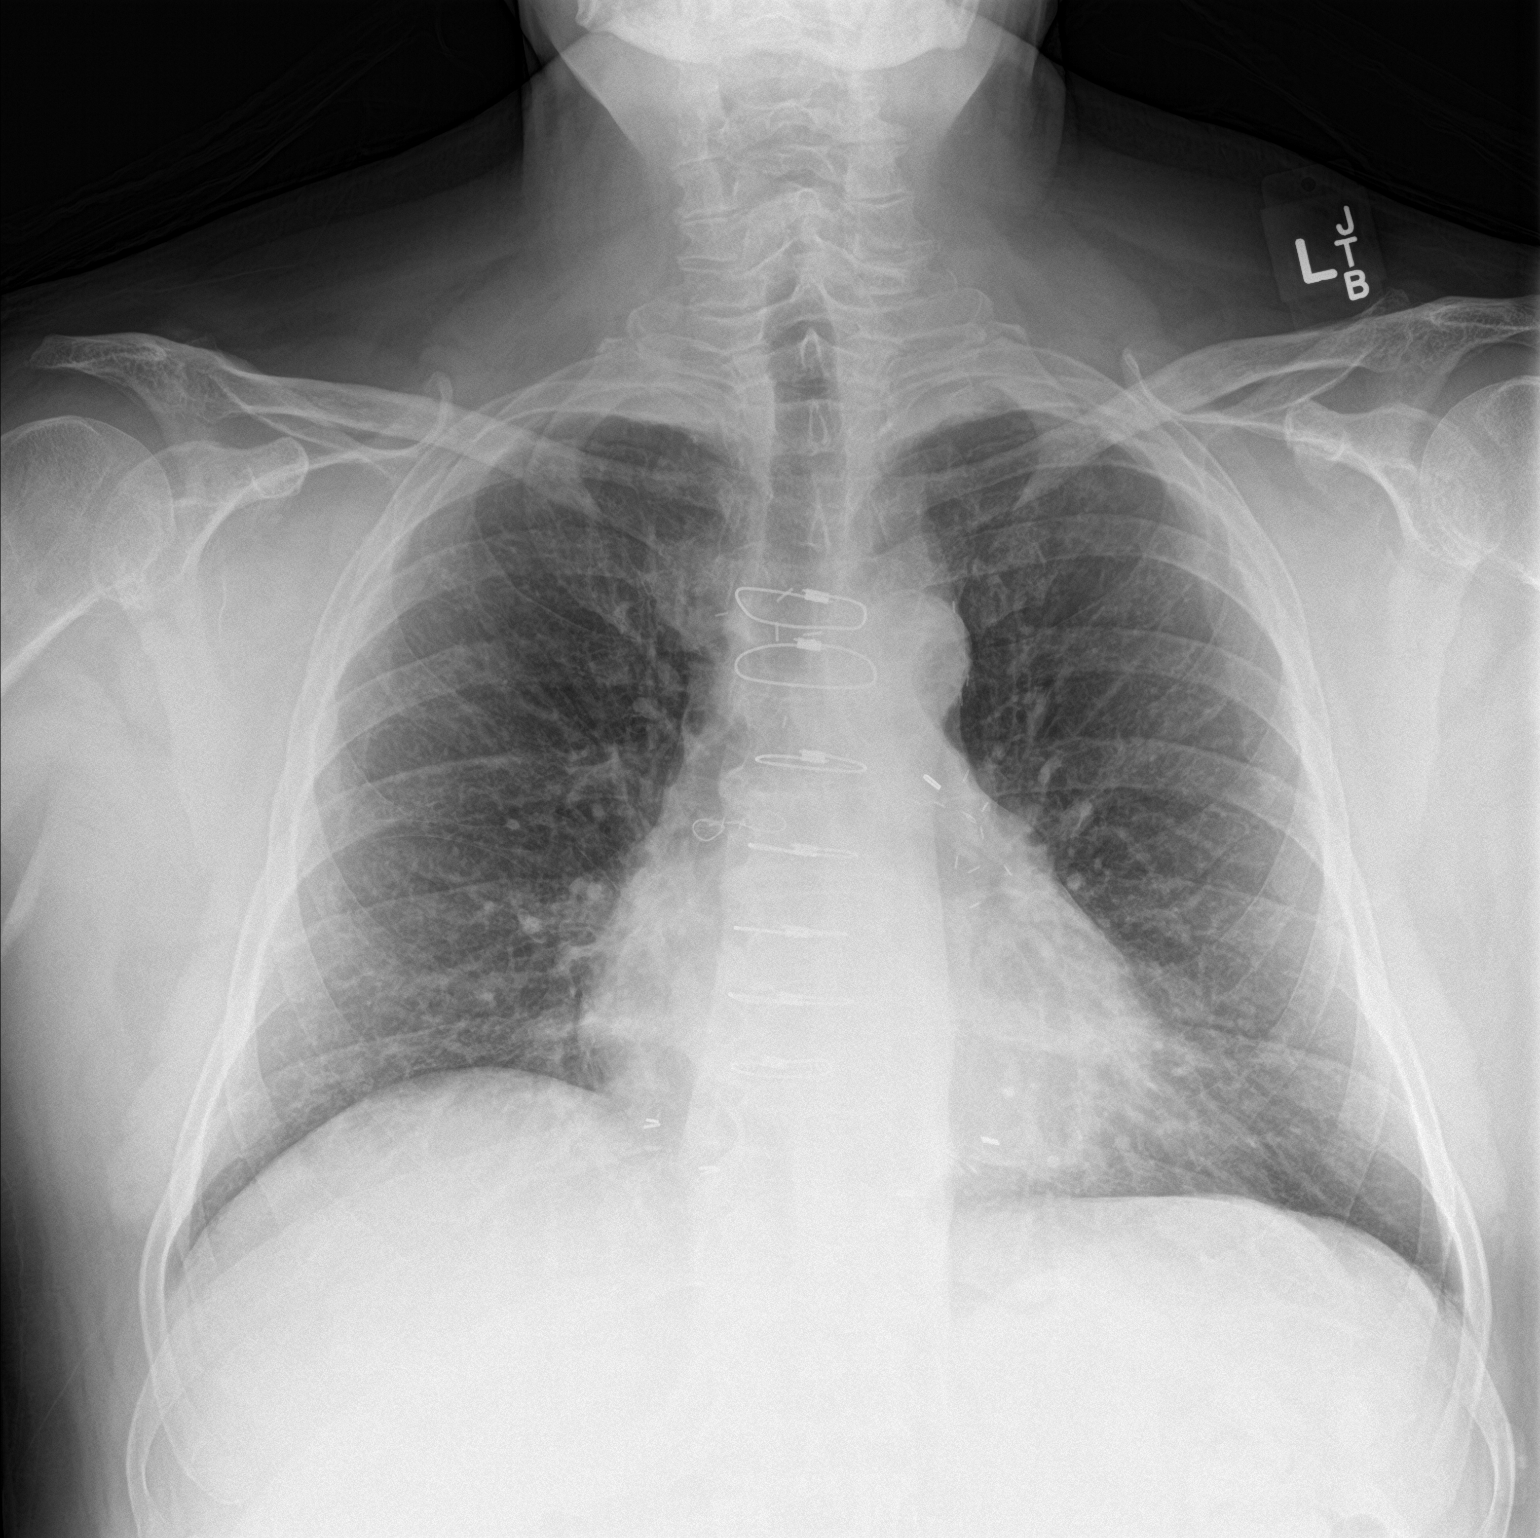

[chest lat]
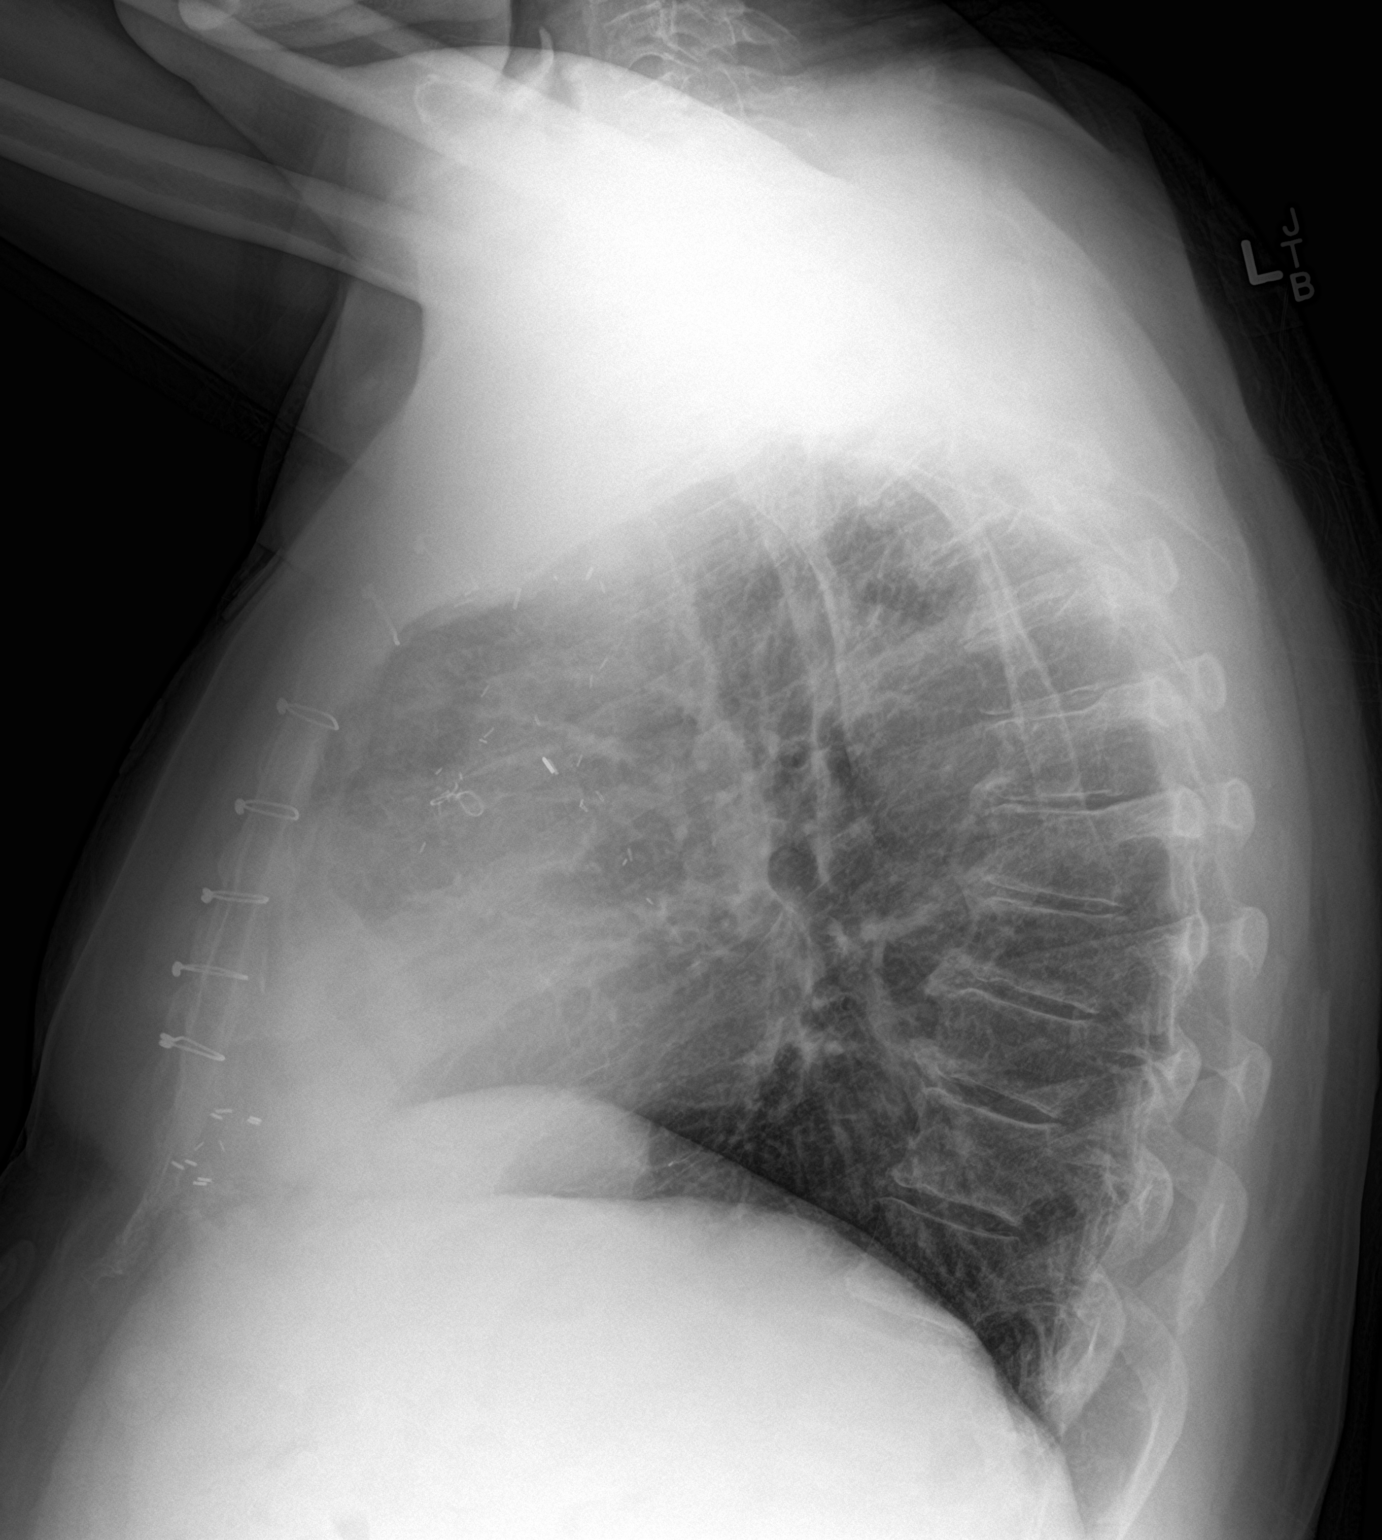

[2 of 2 positions shown; findings below may reference images not displayed]

FINDINGS: Sternotomy for CABG. Cardiac silhouette mildly enlarged, unchanged.
Thoracic aorta mildly tortuous and atherosclerotic, unchanged. Hilar
and mediastinal contours otherwise unremarkable.

Mildly prominent bronchovascular markings diffusely and mild central
peribronchial thickening, more so than on prior examinations. Lungs
otherwise clear. No localized airspace consolidation. No pleural
effusions. No pneumothorax. Normal pulmonary vascularity.
Degenerative changes involving the thoracic spine.
IMPRESSION: 1. Mild changes of acute bronchitis and/or asthma. No acute
cardiopulmonary disease otherwise.
2. Stable mild cardiomegaly without pulmonary edema.
3.  Aortic Atherosclerosis ([id number])

## 2019-05-17 ENCOUNTER — Encounter: Payer: Self-pay | Admitting: Physician Assistant

## 2019-05-17 ENCOUNTER — Telehealth (INDEPENDENT_AMBULATORY_CARE_PROVIDER_SITE_OTHER): Payer: Medicare HMO | Admitting: Physician Assistant

## 2019-05-17 VITALS — BP 134/65 | HR 57 | Ht 68.0 in | Wt 223.0 lb

## 2019-05-17 DIAGNOSIS — Z7189 Other specified counseling: Secondary | ICD-10-CM

## 2019-05-17 DIAGNOSIS — R05 Cough: Secondary | ICD-10-CM | POA: Diagnosis not present

## 2019-05-17 DIAGNOSIS — I10 Essential (primary) hypertension: Secondary | ICD-10-CM | POA: Diagnosis not present

## 2019-05-17 DIAGNOSIS — I25119 Atherosclerotic heart disease of native coronary artery with unspecified angina pectoris: Secondary | ICD-10-CM

## 2019-05-17 DIAGNOSIS — R0602 Shortness of breath: Secondary | ICD-10-CM | POA: Diagnosis not present

## 2019-05-17 DIAGNOSIS — E785 Hyperlipidemia, unspecified: Secondary | ICD-10-CM | POA: Diagnosis not present

## 2019-05-17 DIAGNOSIS — R059 Cough, unspecified: Secondary | ICD-10-CM

## 2019-05-17 MED ORDER — ISOSORBIDE MONONITRATE ER 30 MG PO TB24: 60.0000 mg | ORAL_TABLET | Freq: Every day | ORAL | 1 refills | Status: DC

## 2019-05-17 NOTE — Patient Instructions (Addendum)
Medication Instructions:  Start taking  isosorbide mononitrate to 60 mg once daily ( two 30 mg tablets)   If you need a refill on your cardiac medications before your next appointment, please call your pharmacy.   Lab work: labs next week - BMET, CBC, BNP  If you have labs (blood work) drawn today and your tests are completely normal, you will receive your results only by: Marland Kitchen MyChart Message (if you have MyChart) OR . A paper copy in the mail If you have any lab test that is abnormal or we need to change your treatment, we will call you to review the results.  Testing/Procedures:  1. Your physician has requested that you have a lexiscan myoview. For further information please visit [email or web address]. Please follow instruction sheet, as given. 2. Please go to get a chest X-ray   Follow-Up::Dr. Tamala Julian or Richardson Dopp in 4 weeks (in person)  Any Other Special Instructions Will Be Listed Below (If Applicable).

## 2019-05-17 NOTE — Progress Notes (Signed)
Virtual Visit via Video Note   This visit type was conducted due to national recommendations for restrictions regarding the COVID-19 Pandemic (e.g. social distancing) in an effort to limit this patient's exposure and mitigate transmission in our community.  Due to his co-morbid illnesses, this patient is at least at moderate risk for complications without adequate follow up.  This format is felt to be most appropriate for this patient at this time.  All issues noted in this document were discussed and addressed.  A limited physical exam was performed with this format.  Please refer to the patient's chart for his consent to telehealth for [facility].   Date:  05/17/2019   ID:  Joshua Pillow., DOB January 11, 1941, MRN [id number]  Patient Location: Home Provider Location: Home  PCP:  Dickie La, MD  Cardiologist:  Sinclair Grooms, MD   Electrophysiologist:  None   Evaluation Performed:  Follow-Up Visit  Chief Complaint:  Chest pain, shortness of breath   History of Present Illness:    Joshua T Love Milbourne. is a 78 y.o. male with coronary artery disease status post bypass surgery in 1990 and subsequent PCI to the SVG-RCA with a bare-metal stent in 2011. He was admitted in July 2018 with non-ST elevation myocardial infarction and underwent PCI with a DES x2 to the SVG-RCA.  I saw him December 23, 2017 with complaints of chest discomfort and shortness of breath with exertion.  He was set up for a Cardiac Catheterization but his labs returned with a hemoglobin of 10. We canceled his heart catheterization due to his anemia.   His hemoglobin returned to normal.  His Brilinta was DC'd in 05/2018.    Today, he is seen for further evaluation of chest pain or shortness of breath.  Over the past month, he has noticed that his breathing is more short.  He has also developed some chest tightness with exertion at times.  He has not had to take nitroglycerin.  He has had no associated radiation, nausea  or diaphoresis.  He is usually fairly sedentary.  He does get short of breath with bending over at times as well.  He has not had orthopnea, paroxysmal nocturnal dyspnea, lower extremity swelling, syncope.  He has had a recent nonproductive cough.  He denies any fevers.  He denies any flulike symptoms.  The patient does not have symptoms concerning for COVID-19 infection (fever, chills, cough, or new shortness of breath).    Past Medical History:  Diagnosis Date  . Coronary artery disease involving native coronary artery of native heart with angina pectoris ([place])    a. 1990 s/p CABG x 4 (RIMA->LAD, LIMA->LCX, VG->Diag, VG->RCA); b. 2011 s/p BMS to VG->RCA.; c. Unstable Angina 05/2017: Patent RIMA-LAD & LIMA-LCx, ostSVG-RCA 65% ISR & mSVG-RCA  99% - DES PCI to both  . GERD (gastroesophageal reflux disease)   . History of echocardiogram    a. 01/2017 Echo: EF 55-60%, no rwma, mild MR, midlly dil LA, PASP 30mmHg.  Marland Kitchen History of hiatal hernia   . Hyperlipidemia   . Hypertension   . Obesity    Past Surgical History:  Procedure Laterality Date  . CATARACT EXTRACTION W/ INTRAOCULAR LENS  IMPLANT, BILATERAL Bilateral   . CORONARY ANGIOPLASTY WITH STENT PLACEMENT  2011   "RCA"  . CORONARY ARTERY BYPASS GRAFT  1990   CABG X4  . CORONARY STENT INTERVENTION N/A 06/24/2017   Procedure: Coronary Stent Intervention;  Surgeon: Belva Crome, MD;  Location: [place] [facility];  Service: Cardiovascular: Ostial 4.0 x 12 mm Onyx DES reducing 70% stenosis to less than 40%. Distal body 3.5 x 15 Onyx DES reducing 99% stenosis to less than 30%.  Marland Kitchen LEFT HEART CATH AND CORS/GRAFTS ANGIOGRAPHY N/A 06/24/2017   Procedure: Left Heart Cath and Cors/Grafts Angiography;  Surgeon: Belva Crome, MD;  Location: [facility];  Service: Cardiovascular:  CTO native RCA, LAD & LCx (Graft Dependent). Patent LIMA-LCx & RIMA-LAD.  SVG-RCA: ost 65% ISR & mid 99% --> DES PCI to both     Current Meds  Medication Sig  .  ALPRAZolam (XANAX) 0.5 MG tablet Take 1 tablet (0.5 mg total) by mouth at bedtime as needed (restless legs).  Marland Kitchen aspirin EC 81 MG EC tablet Take 1 tablet (81 mg total) by mouth daily.  Marland Kitchen atenolol-chlorthalidone (TENORETIC) 100-25 MG tablet Take 1 tablet by mouth daily.  . cetirizine (ZYRTEC) 10 MG tablet Take 10 mg by mouth daily.  . citalopram (CELEXA) 20 MG tablet TAKE 1 TABLET(20 MG) BY MOUTH DAILY  . esomeprazole (NEXIUM) 20 MG capsule Take 1 capsule (20 mg total) by mouth daily.  Marland Kitchen ezetimibe (ZETIA) 10 MG tablet Take 1 tablet (10 mg total) by mouth daily.  . ferrous sulfate 325 (65 FE) MG tablet Take 325 mg by mouth 2 (two) times daily with a meal.  . gabapentin (NEURONTIN) 400 MG capsule Take 1 capsule (400 mg total) by mouth 4 (four) times daily.  . isosorbide mononitrate (IMDUR) 30 MG 24 hr tablet Take 2 tablets (60 mg total) by mouth daily.  . nitroGLYCERIN (NITROSTAT) 0.4 MG SL tablet Place 1 tablet (0.4 mg total) under the tongue every 5 (five) minutes x 3 doses as needed for chest pain.  . simvastatin (ZOCOR) 40 MG tablet Take 1 tablet (40 mg total) by mouth daily.  . [DISCONTINUED] isosorbide mononitrate (IMDUR) 30 MG 24 hr tablet Take 1 tablet (30 mg total) by mouth daily.     Allergies:   Ace inhibitors, Atorvastatin, Rosuvastatin, Plavix [clopidogrel bisulfate], Sulfa antibiotics, and Sulfonamide derivatives   Social History   Tobacco Use  . Smoking status: Never Smoker  . Smokeless tobacco: Former Systems developer    Types: Chew  Substance Use Topics  . Alcohol use: Yes    Alcohol/week: 0.0 standard drinks    Comment: 06/23/2017 "a few drinks/ month  . Drug use: No     Family Hx: The patient's family history includes Cancer in his mother; Coronary artery disease in his father; Heart attack in his father; Hyperlipidemia in his son; Hypertension in his son.  ROS:   Please see the history of present illness.     All other systems reviewed and are negative.   Prior CV studies:    The following studies were reviewed today:   Cardiac catheterization 06/24/17 LAD ostial 100 RI 100 LCx ostial 100 RCA ostial 100 SVG-D1 occluded LIMA-OM2 patent RIMA-distal LAD patent SVG-distal RCA stent at origin 47 ISR, mid stent 99 ISR, distal 40 EF 55-65 PCI: 4 x 12 mmResolute Onyx DES to the ostial SVG-RCA; 3.5 x 15 mm Resolute Onyx DES to the mid SVG-RCA  Echocardiogram 02/26/17 EF 55-60, normal wall motion, mild MR, mild LAE, PASP 43  Labs/Other Tests and Data Reviewed:    EKG:  No ECG reviewed.  Recent Labs: 04/20/2019: ALT 29; BUN 17; Creatinine, Ser 0.99; Hemoglobin 14.0; Platelets 172; Potassium 4.2; Sodium 137   Recent Lipid Panel Lab Results  Component  Value Date/Time   CHOL 136 10/06/2017 07:40 AM   TRIG 151 (H) 10/06/2017 07:40 AM   HDL 27 (L) 10/06/2017 07:40 AM   CHOLHDL 5.0 10/06/2017 07:40 AM   CHOLHDL 5.6 12/14/2014 11:47 AM   LDLCALC 79 10/06/2017 07:40 AM   LDLDIRECT 92 04/20/2019 09:45 AM   LDLDIRECT 84 05/14/2016 12:34 PM     Wt Readings from Last 3 Encounters:  05/17/19 223 lb (101.2 kg)  06/30/18 219 lb 6.4 oz (99.5 kg)  04/14/18 221 lb 3.2 oz (100.3 kg)     Objective:    Vital Signs:  BP 134/65   Pulse (!) 57   Ht 5\' 8"  (1.727 m)   Wt 223 lb (101.2 kg)   BMI 33.91 kg/m    VITAL SIGNS:  reviewed GEN:  no acute distress EYES:  sclerae anicteric, EOMI - Extraocular Movements Intact RESPIRATORY:  Normal respiratory effort NEURO:  alert and oriented x 3, no obvious focal deficit PSYCH:  normal affect  ASSESSMENT & PLAN:    Coronary artery disease involving native coronary artery of native heart with angina pectoris ([place]) -  Plan: History of bypass surgery 1990, PCI to the SVG-RCA in 2011 and drug-eluting stent x2 to the SVG-RCA in July 2018.    He was set up for heart catheterization last year due to symptoms of angina.  However, this was canceled secondary to anemia.  His hemoglobin has since returned to normal.  He does  complain of worsening shortness of breath with exertion as well as some chest tightness associated with it.  He has not had to take any nitroglycerin.  I think he would benefit from stress testing for risk stratification.  If he has high risk study, we will need to proceed with cardiac catheterization.  -Arrange Lexiscan Myoview  -Labs: BMET, CBC, BNP  -Increase isosorbide to 60 mg daily  -Follow-up in 4 weeks with Dr. Tamala Julian or me (in person)  Shortness of breath -  Plan: With shortness of breath with bending over, I wonder if he may have some volume excess leading to some of his symptoms.  Obtain BNP as outlined above.  If his BNP is elevated, I will stop his chlorthalidone and place him on Lasix and obtain an echocardiogram.  He does have a nonproductive cough.  However, his symptoms do not sound consistent with COVID-19.  In any event, I have recommended that his stress test and labs to be obtained next week.  If his cough or shortness of breath should worsen and become associated with fever, he will need to be evaluated by primary care.  Essential hypertension -  Plan: Controlled.  Adjust isosorbide as noted for antianginal control.  Hyperlipidemia, unspecified hyperlipidemia type -  Plan: Continue simvastatin.  Cough -  Plan: As noted, his symptoms sound as though they could be allergic or viral URI.  He does not have symptoms consistent with COVID-19.  I have asked him to contact his primary care physician if he starts to develop worsening cough or develops a fever.  I will go ahead and send him for chest x-ray.  COVID-19 Education: The signs and symptoms of COVID-19 were discussed with the patient and how to seek care for testing (follow up with PCP or arrange E-visit).  The importance of social distancing was discussed today.  Time:   Today, I have spent 16 minutes with the patient with telehealth technology discussing the above problems.     Medication Adjustments/Labs and Tests  Ordered:  Current medicines are reviewed at length with the patient today.  Concerns regarding medicines are outlined above.   Tests Ordered: Orders Placed This Encounter  Procedures  . DG Chest 2 View  . Basic metabolic panel  . CBC  . Pro b natriuretic peptide (BNP)  . MYOCARDIAL PERFUSION IMAGING    Medication Changes: Meds ordered this encounter  Medications  . isosorbide mononitrate (IMDUR) 30 MG 24 hr tablet    Sig: Take 2 tablets (60 mg total) by mouth daily.    Dispense:  180 tablet    Refill:  1    Follow Up:  In Person in 4 week(s) with Dr. Tamala Julian or Joshua Dopp, PA-C   Signed, Joshua Dopp, PA-C  05/17/2019 5:10 PM    [facility]

## 2019-05-18 ENCOUNTER — Ambulatory Visit
Admission: RE | Admit: 2019-05-18 | Discharge: 2019-05-18 | Disposition: A | Discharge status: Home / Self Care | Payer: Medicare HMO | Source: Ambulatory Visit | Attending: Physician Assistant | Admitting: Physician Assistant

## 2019-05-18 ENCOUNTER — Other Ambulatory Visit: Payer: Self-pay

## 2019-05-18 DIAGNOSIS — R0602 Shortness of breath: Secondary | ICD-10-CM

## 2019-05-18 DIAGNOSIS — R05 Cough: Secondary | ICD-10-CM | POA: Diagnosis not present

## 2019-05-18 DIAGNOSIS — R059 Cough, unspecified: Secondary | ICD-10-CM

## 2019-05-19 ENCOUNTER — Telehealth (HOSPITAL_COMMUNITY): Payer: Self-pay | Admitting: *Deleted

## 2019-05-19 NOTE — Telephone Encounter (Signed)
Patient given detailed instructions per Myocardial Perfusion Study Information Sheet for the test on 05/23/19 at 7:15. Patient notified to arrive 15 minutes early and that it is imperative to arrive on time for appointment to keep from having the test rescheduled.  If you need to cancel or reschedule your appointment, please call the office within 24 hours of your appointment. . Patient verbalized understanding.Joshua [name]

## 2019-05-20 ENCOUNTER — Telehealth: Payer: Self-pay | Admitting: *Deleted

## 2019-05-20 NOTE — Telephone Encounter (Signed)
    COVID-19 Pre-Screening Questions:  . In the past 7 to 10 days have you had a cough,  shortness of breath, headache, congestion, fever (100 or greater) body aches, chills, sore throat, or sudden loss of taste or sense of smell? . Have you been around anyone with known Covid 19. . Have you been around anyone who is awaiting Covid 19 test results in the past 7 to 10 days? . Have you been around anyone who has been exposed to Covid 19, or has mentioned symptoms of Covid 19 within the past 7 to 10 days?  If you have any concerns/questions about symptoms patients report during screening (either on the phone or at threshold). Contact the provider seeing the patient or DOD for further guidance.  If neither are available contact a member of the leadership team.           Contacted patient via telephone call. All no to Covid 19 questions . Has a mask.KB 

## 2019-05-23 ENCOUNTER — Encounter (HOSPITAL_COMMUNITY): Payer: Self-pay

## 2019-05-23 ENCOUNTER — Other Ambulatory Visit: Payer: Self-pay

## 2019-05-23 ENCOUNTER — Ambulatory Visit (HOSPITAL_COMMUNITY): Payer: Medicare HMO | Attending: Internal Medicine

## 2019-05-23 ENCOUNTER — Other Ambulatory Visit: Payer: Medicare HMO | Admitting: *Deleted

## 2019-05-23 DIAGNOSIS — R0602 Shortness of breath: Secondary | ICD-10-CM

## 2019-05-23 LAB — CBC
Hematocrit: 42.1 % (ref 37.5–51.0)
Hemoglobin: 14.1 g/dL (ref 13.0–17.7)
MCH: 29.1 pg (ref 26.6–33.0)
MCHC: 33.5 g/dL (ref 31.5–35.7)
MCV: 87 fL (ref 79–97)
Platelets: 181 10*3/uL (ref 150–450)
RBC: 4.85 x10E6/uL (ref 4.14–5.80)
RDW: 13.7 % (ref 11.6–15.4)
WBC: 6.9 10*3/uL (ref 3.4–10.8)

## 2019-05-23 LAB — BASIC METABOLIC PANEL
BUN/Creatinine Ratio: 17 (ref 10–24)
BUN: 19 mg/dL (ref 8–27)
CO2: 23 mmol/L (ref 20–29)
Calcium: 9 mg/dL (ref 8.6–10.2)
Chloride: 98 mmol/L (ref 96–106)
Creatinine, Ser: 1.1 mg/dL (ref 0.76–1.27)
GFR calc Af Amer: 76 mL/min/{1.73_m2} (ref >59)
GFR calc non Af Amer: 65 mL/min/{1.73_m2} (ref >59)
Glucose: 111 mg/dL — ABNORMAL HIGH (ref 65–99)
Potassium: 3.9 mmol/L (ref 3.5–5.2)
Sodium: 137 mmol/L (ref 134–144)

## 2019-05-23 LAB — MYOCARDIAL PERFUSION IMAGING
LV dias vol: 95 mL (ref 62–150)
LV sys vol: 41 mL
Peak HR: 67 {beats}/min
Rest HR: 54 {beats}/min
SDS: 1
SRS: 0
SSS: 1
TID: 1.01

## 2019-05-23 LAB — PRO B NATRIURETIC PEPTIDE: NT-Pro BNP: 154 pg/mL (ref 0–486)

## 2019-05-23 MED ORDER — TECHNETIUM TC 99M TETROFOSMIN IV KIT
11.0000 | PACK | Freq: Once | INTRAVENOUS | Status: AC | PRN
  Administered 2019-05-23: 11 via INTRAVENOUS
  Filled 2019-05-23: qty 11

## 2019-05-23 MED ORDER — TECHNETIUM TC 99M TETROFOSMIN IV KIT
30.9000 | PACK | Freq: Once | INTRAVENOUS | Status: AC | PRN
  Administered 2019-05-23: 30.9 via INTRAVENOUS
  Filled 2019-05-23: qty 31

## 2019-05-23 MED ORDER — REGADENOSON 0.4 MG/5ML IV SOLN
0.4000 mg | Freq: Once | INTRAVENOUS | Status: AC
  Administered 2019-05-23: 0.4 mg via INTRAVENOUS

## 2019-05-24 ENCOUNTER — Other Ambulatory Visit: Payer: Self-pay | Admitting: *Deleted

## 2019-05-24 MED ORDER — ISOSORBIDE MONONITRATE ER 30 MG PO TB24: 90.0000 mg | ORAL_TABLET | Freq: Every day | ORAL | 1 refills | Status: DC

## 2019-06-17 MED ORDER — ISOSORBIDE MONONITRATE ER 30 MG PO TB24: 90.0000 mg | ORAL_TABLET | Freq: Every day | ORAL | 1 refills | Status: DC

## 2019-06-21 ENCOUNTER — Ambulatory Visit: Payer: Medicare HMO | Admitting: Physician Assistant

## 2019-08-10 ENCOUNTER — Other Ambulatory Visit: Payer: Self-pay | Admitting: Family Medicine

## 2019-09-28 ENCOUNTER — Other Ambulatory Visit: Payer: Self-pay | Admitting: Family Medicine

## 2019-10-17 ENCOUNTER — Other Ambulatory Visit: Payer: Self-pay | Admitting: Physician Assistant

## 2019-10-17 ENCOUNTER — Other Ambulatory Visit: Payer: Self-pay | Admitting: Family Medicine

## 2019-10-20 ENCOUNTER — Other Ambulatory Visit: Payer: Self-pay | Admitting: *Deleted

## 2019-10-21 MED ORDER — ALPRAZOLAM 0.5 MG PO TABS: 0.5000 mg | ORAL_TABLET | Freq: Every evening | ORAL | 3 refills | Status: DC | PRN

## 2019-12-09 ENCOUNTER — Other Ambulatory Visit: Payer: Self-pay

## 2019-12-09 MED ORDER — SIMVASTATIN 40 MG PO TABS: 40.0000 mg | ORAL_TABLET | Freq: Every day | ORAL | 3 refills | Status: DC

## 2019-12-09 MED ORDER — ISOSORBIDE MONONITRATE ER 30 MG PO TB24: 90.0000 mg | ORAL_TABLET | Freq: Every day | ORAL | 1 refills | Status: DC

## 2019-12-09 NOTE — Telephone Encounter (Signed)
[facility] asking for clarification for Simvastatin 40 mg. Pt showing an allergy or sensitivity to statins. Do you wish to change therapy or continue current treatment? Ottis Stain, CMA

## 2019-12-16 ENCOUNTER — Other Ambulatory Visit: Payer: Self-pay | Admitting: *Deleted

## 2019-12-16 MED ORDER — ALPRAZOLAM 0.5 MG PO TABS: 0.5000 mg | ORAL_TABLET | Freq: Every evening | ORAL | 3 refills | Status: DC | PRN

## 2020-02-05 ENCOUNTER — Ambulatory Visit: Payer: Medicare Other

## 2020-02-11 ENCOUNTER — Ambulatory Visit: Payer: Medicare Other | Attending: Internal Medicine

## 2020-02-11 DIAGNOSIS — Z23 Encounter for immunization: Secondary | ICD-10-CM

## 2020-02-11 NOTE — Progress Notes (Signed)
   [id number]  Name:  Joshua [name].    MRN: [id number] DOB: 04-22-1941  02/11/2020  [name] was observed post Covid-19 immunization for 15 minutes without incident. He was provided with Vaccine Information Sheet and instruction to access the V-Safe system.   [name] was instructed to call 911 with any severe reactions post vaccine: Marland Kitchen Difficulty breathing  . Swelling of face and throat  . A fast heartbeat  . A bad rash all over body  . Dizziness and weakness   Immunizations Administered    Name Date Dose VIS Date Route   Pfizer COVID-19 Vaccine 02/11/2020  2:06 PM 0.3 mL 11/11/2019 Intramuscular   Manufacturer: [employer]   Lot: [id number]   [place]: [id number]

## 2020-02-18 ENCOUNTER — Telehealth: Payer: Medicare Other | Admitting: Nurse Practitioner

## 2020-02-18 DIAGNOSIS — N3000 Acute cystitis without hematuria: Secondary | ICD-10-CM | POA: Diagnosis not present

## 2020-02-18 MED ORDER — CEPHALEXIN 500 MG PO CAPS: 500.0000 mg | ORAL_CAPSULE | Freq: Two times a day (BID) | ORAL | 0 refills | Status: DC

## 2020-02-18 NOTE — Progress Notes (Signed)
We are sorry that you are not feeling well.  Here is how we plan to help!  Based on w.etime hat you shared with me it looks like you most likely have a simple urinary tract infection.  A UTI (Urinary Tract Infection) is a bacterial infection of the bladder.  Most cases of urinary tract infections are simple to treat but a key part of your care is to encourage you to drink plenty of fluids and watch your symptoms carefully.  I have prescribed Keflex 500 mg twice a day for 7 days.  Your symptoms should gradually improve. Call us if the burning in your urine worsens, you develop worsening fever, back pain or pelvic pain or if your symptoms do not resolve after completing the antibiotic.  Urinary tract infections can be prevented by drinking plenty of water to keep your body hydrated.  Also be sure when you wipe, wipe from front to back and don't hold it in!  If possible, empty your bladder every 4 hours.  Your e-visit answers were reviewed by a board certified advanced clinical practitioner to complete your personal care plan.  Depending on the condition, your plan could have included both over the counter or prescription medications.  If there is a problem please reply  once you have received a response from your provider.  Your safety is important to [place].  If you have drug allergies check your prescription carefully.    You can use MyChart to ask questions about today's visit, request a non-urgent call back, or ask for a work or school excuse for 24 hours related to this e-Visit. If it has been greater than 24 hours you will need to follow up with your provider, or enter a new e-Visit to address those concerns.   You will get an e-mail in the next two days asking about your experience.  I hope that your e-visit has been valuable and will speed your recovery. Thank you for using e-visits.   5-10 minutes spent reviewing and documenting in chart.

## 2020-03-06 ENCOUNTER — Ambulatory Visit: Payer: Medicare Other | Attending: Internal Medicine

## 2020-03-06 DIAGNOSIS — Z23 Encounter for immunization: Secondary | ICD-10-CM

## 2020-03-06 NOTE — Progress Notes (Signed)
   [id number]  Name:  Joshua [name].    MRN: [id number] DOB: 1941-10-26  03/06/2020  Joshua [name] was observed post Covid-19 immunization for 15 minutes without incident. He was provided with Vaccine Information Sheet and instruction to access the V-Safe system.   Joshua [name] was instructed to call 911 with any severe reactions post vaccine: Marland Kitchen Difficulty breathing  . Swelling of face and throat  . A fast heartbeat  . A bad rash all over body  . Dizziness and weakness   Immunizations Administered    Name Date Dose VIS Date Route   Pfizer COVID-19 Vaccine 03/06/2020  1:58 PM 0.3 mL 11/11/2019 Intramuscular   Manufacturer: [employer]   Lot: [id number]   [place]: [id number]

## 2020-04-03 ENCOUNTER — Other Ambulatory Visit: Payer: Self-pay | Admitting: Family Medicine

## 2020-05-17 DIAGNOSIS — L918 Other hypertrophic disorders of the skin: Secondary | ICD-10-CM | POA: Diagnosis not present

## 2020-05-17 DIAGNOSIS — L570 Actinic keratosis: Secondary | ICD-10-CM | POA: Diagnosis not present

## 2020-05-17 DIAGNOSIS — L438 Other lichen planus: Secondary | ICD-10-CM | POA: Diagnosis not present

## 2020-05-17 DIAGNOSIS — L820 Inflamed seborrheic keratosis: Secondary | ICD-10-CM | POA: Diagnosis not present

## 2020-05-17 DIAGNOSIS — L821 Other seborrheic keratosis: Secondary | ICD-10-CM | POA: Diagnosis not present

## 2020-05-30 ENCOUNTER — Other Ambulatory Visit: Payer: Self-pay

## 2020-05-30 ENCOUNTER — Ambulatory Visit (INDEPENDENT_AMBULATORY_CARE_PROVIDER_SITE_OTHER): Payer: Medicare Other | Admitting: Family Medicine

## 2020-05-30 ENCOUNTER — Encounter: Payer: Self-pay | Admitting: Family Medicine

## 2020-05-30 VITALS — BP 118/62 | HR 86 | Ht 68.0 in | Wt 234.0 lb

## 2020-05-30 DIAGNOSIS — I739 Peripheral vascular disease, unspecified: Secondary | ICD-10-CM | POA: Diagnosis not present

## 2020-05-30 DIAGNOSIS — B0229 Other postherpetic nervous system involvement: Secondary | ICD-10-CM | POA: Diagnosis not present

## 2020-05-30 DIAGNOSIS — I25119 Atherosclerotic heart disease of native coronary artery with unspecified angina pectoris: Secondary | ICD-10-CM

## 2020-05-30 DIAGNOSIS — I10 Essential (primary) hypertension: Secondary | ICD-10-CM

## 2020-05-30 DIAGNOSIS — E785 Hyperlipidemia, unspecified: Secondary | ICD-10-CM

## 2020-05-30 DIAGNOSIS — Z125 Encounter for screening for malignant neoplasm of prostate: Secondary | ICD-10-CM | POA: Diagnosis not present

## 2020-05-30 NOTE — Patient Instructions (Signed)
Please call and schedule an appointment with your cardiologist I have ordered lower extremity vascular ultrasound Great to see yoU!

## 2020-05-31 LAB — COMPREHENSIVE METABOLIC PANEL
ALT: 25 IU/L (ref 0–44)
AST: 37 IU/L (ref 0–40)
Albumin/Globulin Ratio: 2 (ref 1.2–2.2)
Albumin: 4.3 g/dL (ref 3.7–4.7)
Alkaline Phosphatase: 52 IU/L (ref 48–121)
BUN/Creatinine Ratio: 22 (ref 10–24)
BUN: 21 mg/dL (ref 8–27)
Bilirubin Total: 0.5 mg/dL (ref 0.0–1.2)
CO2: 22 mmol/L (ref 20–29)
Calcium: 9.3 mg/dL (ref 8.6–10.2)
Chloride: 99 mmol/L (ref 96–106)
Creatinine, Ser: 0.97 mg/dL (ref 0.76–1.27)
GFR calc Af Amer: 87 mL/min/{1.73_m2} (ref >59)
GFR calc non Af Amer: 76 mL/min/{1.73_m2} (ref >59)
Globulin, Total: 2.2 g/dL (ref 1.5–4.5)
Glucose: 100 mg/dL — ABNORMAL HIGH (ref 65–99)
Potassium: 4.4 mmol/L (ref 3.5–5.2)
Sodium: 135 mmol/L (ref 134–144)
Total Protein: 6.5 g/dL (ref 6.0–8.5)

## 2020-05-31 LAB — LDL CHOLESTEROL, DIRECT: LDL Direct: 87 mg/dL (ref 0–99)

## 2020-05-31 LAB — PSA: Prostate Specific Ag, Serum: 0.9 ng/mL (ref 0.0–4.0)

## 2020-06-02 ENCOUNTER — Encounter: Payer: Self-pay | Admitting: Family Medicine

## 2020-06-02 NOTE — Assessment & Plan Note (Signed)
Stable and improved

## 2020-06-02 NOTE — Assessment & Plan Note (Signed)
labs

## 2020-06-02 NOTE — Assessment & Plan Note (Signed)
Good control Labs Continue current meds

## 2020-06-02 NOTE — Assessment & Plan Note (Signed)
Hx CAD now with some increased DOE as well as now having some symptoms c/w claudication. He will make appt to see cardiologist (in next week or so) and I will put in for vascualr ABI study. Consider vascular surgery referral depending on results of ABI.

## 2020-06-02 NOTE — Progress Notes (Signed)
° ° °  CHIEF COMPLAINT / HPI:  CPE and check up 1. Mass on right elbow where he had a previous bursitis; now it feels more firm. No pain 2. Saw dermatologist recently and has\d several skin lesions frozen 3.hacving some episodes of "legs feeling weak". Notices it especially walking up hill, mowing grass. When walking on flat ground, no symptoms. 4. Also having a little ore SOb with exertion. No chest pain but gets out of breath easier than previously.He wonders if it is from his weight gain. No lower extremity edema. Saw his cardiologist last about a year ago   PERTINENT  PMH / [place]: I have reviewed the patients medications, allergies, past medical and surgical history, smoking status and updated in the EMR as appropriate.   OBJECTIVE:  BP 118/62    Pulse 86    Ht 5\' 8"  (1.727 m)    Wt 234 lb (106.1 kg)    SpO2 96%    BMI 35.58 kg/m   CV: RRR without murmur. Distal pulses 1-2+ bilaterally symmetrical at DP and Pt. Radial 2+ B=. NECK no bruits, no JVD, no TM LUNGS: CTA bilaterally with breath sounds heard in all lung fields. No rales or wheezes ABDOMEN: soft, positive bowel sounds. Obese. EXTREMITY: MOE ax 4, no edema . Normal muscle bulk and tone. Right elbow has firm mass over olecranon, nontender, no fluctuance. Mobile.c/w chronic olecranon bursa NEURO: no gross focal deficit is noted. Normal gait. Rises from a chair without assistance. PSYCH: AxOx$. Normally interactive.  ASSESSMENT / PLAN:   Essential hypertension Good control Labs Continue current meds  Postherpetic neuralgia Stable and improved  CAD (coronary artery disease) Hx CAD now with some increased DOE as well as now having some symptoms c/w claudication. He will make appt to see cardiologist (in next week or so) and I will put in for vascualr ABI study. Consider vascular surgery referral depending on results of ABI.  Hyperlipidemia with target low density lipoprotein (LDL) cholesterol less than 70 mg/dL labs     Dorcas Mcmurray MD

## 2020-06-05 ENCOUNTER — Ambulatory Visit (HOSPITAL_COMMUNITY): Payer: Medicare Other

## 2020-06-08 ENCOUNTER — Encounter: Payer: Self-pay | Admitting: Family Medicine

## 2020-06-08 NOTE — Progress Notes (Signed)
Normal. Letter sent.

## 2020-06-08 NOTE — Progress Notes (Signed)
Normal letter sent

## 2020-06-13 ENCOUNTER — Other Ambulatory Visit: Payer: Self-pay

## 2020-06-13 ENCOUNTER — Ambulatory Visit (HOSPITAL_COMMUNITY)
Admission: RE | Admit: 2020-06-13 | Discharge: 2020-06-13 | Disposition: A | Discharge status: Home / Self Care | Payer: Medicare Other | Source: Ambulatory Visit | Attending: Family Medicine | Admitting: Family Medicine

## 2020-06-13 DIAGNOSIS — I739 Peripheral vascular disease, unspecified: Secondary | ICD-10-CM

## 2020-06-18 NOTE — Progress Notes (Signed)
Slight decrease in right toe blood flow but otherwise within normal limits. Will discuss at our follow up. No sign large vessel (thigh) disease

## 2020-07-01 ENCOUNTER — Other Ambulatory Visit: Payer: Self-pay | Admitting: Family Medicine

## 2020-07-27 NOTE — Progress Notes (Signed)
Cardiology Office Note:    Date:  08/01/2020   ID:  Joshua [name]., DOB 07-18-1941, MRN [id number]  PCP:  Dickie La, MD  Cardiologist:  Sinclair Grooms, MD   Referring MD: Dickie La, MD   Chief Complaint  Patient presents with  . Coronary Artery Disease  . Congestive Heart Failure    History of Present Illness:    Joshua [name]. is a 79 y.o. male with a hx of coronary artery disease status post bypass surgery in 1990 and subsequent PCI to the SVG-RCA with a bare-metal stent in 2011. He was admitted in July 2018 with non-ST elevation myocardial infarction and underwent PCI with a DES x2 to the SVG-RCA. Seen Feb. 2019 with angina and noted to have anemia. 14 months ago c/o DOE.  He is not having angina.  He does have shortness of breath with bending and some physical activities.  He denies orthopnea and peripheral edema.  He has not had palpitations or syncope.  And denies claudication.  He can still mow his grass.  Past Medical History:  Diagnosis Date  . Coronary artery disease involving native coronary artery of native heart with angina pectoris ([place])    a. 1990 s/p CABG x 4 (RIMA->LAD, LIMA->LCX, VG->Diag, VG->RCA); b. 2011 s/p BMS to VG->RCA.; c. Unstable Angina 05/2017: Patent RIMA-LAD & LIMA-LCx, ostSVG-RCA 65% ISR & mSVG-RCA  99% - DES PCI to both // Myoview 05/2019:  EF 57, no ischemia or scar, Low Risk   . GERD (gastroesophageal reflux disease)   . History of echocardiogram    a. 01/2017 Echo: EF 55-60%, no rwma, mild MR, midlly dil LA, PASP 2mmHg.  Marland Kitchen History of hiatal hernia   . Hyperlipidemia   . Hypertension   . Obesity     Past Surgical History:  Procedure Laterality Date  . CATARACT EXTRACTION W/ INTRAOCULAR LENS  IMPLANT, BILATERAL Bilateral   . CORONARY ANGIOPLASTY WITH STENT PLACEMENT  2011   "RCA"  . CORONARY ARTERY BYPASS GRAFT  1990   CABG X4  . CORONARY STENT INTERVENTION N/A 06/24/2017   Procedure: Coronary Stent Intervention;   Surgeon: Belva Crome, MD;  Location: [facility];  Service: Cardiovascular: Ostial 4.0 x 12 mm Onyx DES reducing 70% stenosis to less than 40%. Distal body 3.5 x 15 Onyx DES reducing 99% stenosis to less than 30%.  Marland Kitchen LEFT HEART CATH AND CORS/GRAFTS ANGIOGRAPHY N/A 06/24/2017   Procedure: Left Heart Cath and Cors/Grafts Angiography;  Surgeon: Belva Crome, MD;  Location: [facility];  Service: Cardiovascular:  CTO native RCA, LAD & LCx (Graft Dependent). Patent LIMA-LCx & RIMA-LAD.  SVG-RCA: ost 65% ISR & mid 99% --> DES PCI to both    Current Medications: Current Meds  Medication Sig  . ALPRAZolam (XANAX) 0.5 MG tablet Take 1 tablet (0.5 mg total) by mouth at bedtime as needed (restless legs).  Marland Kitchen aspirin EC 81 MG EC tablet Take 1 tablet (81 mg total) by mouth daily.  Marland Kitchen atenolol-chlorthalidone (TENORETIC) 100-25 MG tablet TAKE 1 TABLET BY MOUTH  DAILY  . citalopram (CELEXA) 20 MG tablet TAKE 1 TABLET BY MOUTH  DAILY  . esomeprazole (NEXIUM) 20 MG capsule Take 1 capsule (20 mg total) by mouth daily.  Marland Kitchen ezetimibe (ZETIA) 10 MG tablet TAKE 1 TABLET BY MOUTH  DAILY  . gabapentin (NEURONTIN) 300 MG capsule Take 300 mg by mouth 3 (three) times daily. 2 tab in the morning,  1 at noon, and 2 at bedtime  . isosorbide mononitrate (IMDUR) 30 MG 24 hr tablet TAKE 3 TABLETS BY MOUTH  DAILY  . Loratadine (CLARITIN) 10 MG CAPS Take 1 capsule by mouth daily as needed.  . nitroGLYCERIN (NITROSTAT) 0.4 MG SL tablet Place 1 tablet (0.4 mg total) under the tongue every 5 (five) minutes x 3 doses as needed for chest pain.  . simvastatin (ZOCOR) 40 MG tablet Take 1 tablet (40 mg total) by mouth daily.     Allergies:   Ace inhibitors, Atorvastatin, Rosuvastatin, Plavix [clopidogrel bisulfate], Sulfa antibiotics, and Sulfonamide derivatives   Social History   Socioeconomic History  . Marital status: Married    Spouse name: Not on file  . Number of children: Not on file  . Years of education: Not  on file  . Highest education level: Not on file  Occupational History  . Occupation: retired    Comment: Retail buyer (truck driver, heating and air)  Tobacco Use  . Smoking status: Never Smoker  . Smokeless tobacco: Former Systems developer    Types: Secondary school teacher  . Vaping Use: Never used  Substance and Sexual Activity  . Alcohol use: Yes    Alcohol/week: 0.0 standard drinks    Comment: 06/23/2017 "a few drinks/ month  . Drug use: No  . Sexual activity: Not Currently  Other Topics Concern  . Not on file  Social History Narrative   Lives locally with his wife [name] - former [employer] ECG tech).  Does not routinely exercise.   Social Determinants of Health   Financial Resource Strain:   . Difficulty of Paying Living Expenses: Not on file  Food Insecurity:   . Worried About Charity fundraiser in the Last Year: Not on file  . Ran Out of Food in the Last Year: Not on file  Transportation Needs:   . Lack of Transportation (Medical): Not on file  . Lack of Transportation (Non-Medical): Not on file  Physical Activity:   . Days of Exercise per Week: Not on file  . Minutes of Exercise per Session: Not on file  Stress:   . Feeling of Stress : Not on file  Social Connections:   . Frequency of Communication with Friends and Family: Not on file  . Frequency of Social Gatherings with Friends and Family: Not on file  . Attends Religious Services: Not on file  . Active Member of Clubs or Organizations: Not on file  . Attends Archivist Meetings: Not on file  . Marital Status: Not on file     Family History: The patient's family history includes Cancer in his mother; Coronary artery disease in his father; Heart attack in his father; Hyperlipidemia in his son; Hypertension in his son.  ROS:   Please see the history of present illness.    Aches and pains all over.  Cramping in different places occurs.  All other systems reviewed and are negative.  EKGs/Labs/Other Studies Reviewed:     The following studies were reviewed today:  MYOCARDIAL PERFUSION STUDY 05/23/19 Study Highlights   Nuclear stress EF: 57%.  Normal perfusion No ischemia or scar  This is a low risk study.    EKG:  EKG August 01, 2020 demonstrates sinus rhythm, normal PR interval, when compared to prior tracings from 2019, PACs are no longer present.  Recent Labs: 05/30/2020: ALT 25; BUN 21; Creatinine, Ser 0.97; Potassium 4.4; Sodium 135  Recent Lipid Panel    Component Value  Date/Time   CHOL 136 10/06/2017 0740   TRIG 151 (H) 10/06/2017 0740   HDL 27 (L) 10/06/2017 0740   CHOLHDL 5.0 10/06/2017 0740   CHOLHDL 5.6 12/14/2014 1147   VLDL 46 (H) 12/14/2014 1147   LDLCALC 79 10/06/2017 0740   LDLDIRECT 87 05/30/2020 1231   LDLDIRECT 84 05/14/2016 1234    Physical Exam:    VS:  BP 134/68   Pulse 63   Ht 5\' 8"  (1.727 m)   Wt 232 lb (105.2 kg)   SpO2 97%   BMI 35.28 kg/m     Wt Readings from Last 3 Encounters:  08/01/20 232 lb (105.2 kg)  05/30/20 234 lb (106.1 kg)  05/23/19 223 lb (101.2 kg)     GEN: Obese, especially abdominal.. No acute distress HEENT: Normal NECK: No JVD. LYMPHATICS: No lymphadenopathy CARDIAC:  RRR without murmur, gallop, or edema. VASCULAR:  Normal Pulses. No bruits. RESPIRATORY:  Clear to auscultation without rales, wheezing or rhonchi  ABDOMEN: Soft, non-tender, non-distended, No pulsatile mass, MUSCULOSKELETAL: No deformity  SKIN: Warm and dry NEUROLOGIC:  Alert and oriented x 3 PSYCHIATRIC:  Normal affect   ASSESSMENT:    1. Coronary artery disease involving native coronary artery of native heart with angina pectoris ([place])   2. Essential hypertension   3. Hyperlipidemia, unspecified hyperlipidemia type   4. Shortness of breath   5. Educated about COVID-19 virus infection    PLAN:    In order of problems listed above:  1. Risk prevention discussed.  Please see below.  Exercise greater than 150 minutes/week is encouraged. 2. Blood  pressure control is adequate. 3. The most recent LDL was 87 in June.  Target is less than 70.  I encouraged significantly decreasing fat in his diet. 4. A BNP will be done to look for signal of diastolic heart failure. 5. COVID-19 vaccine, medication technique, and desire for booster all discussed.  Overall education and awareness concerning primary/secondary risk prevention was discussed in detail: LDL less than 70, hemoglobin A1c less than 7, blood pressure target less than 130/80 mmHg, >150 minutes of moderate aerobic activity per week, avoidance of smoking, weight control (via diet and exercise), and continued surveillance/management of/for obstructive sleep apnea.    Medication Adjustments/Labs and Tests Ordered: Current medicines are reviewed at length with the patient today.  Concerns regarding medicines are outlined above.  Orders Placed This Encounter  Procedures  . Pro b natriuretic peptide  . EKG 12-Lead   No orders of the defined types were placed in this encounter.   There are no Patient Instructions on file for this visit.   Signed, Sinclair Grooms, MD  08/01/2020 9:10 AM    [place]

## 2020-08-01 ENCOUNTER — Encounter: Payer: Self-pay | Admitting: Interventional Cardiology

## 2020-08-01 ENCOUNTER — Ambulatory Visit: Payer: Medicare Other | Admitting: Interventional Cardiology

## 2020-08-01 ENCOUNTER — Other Ambulatory Visit: Payer: Self-pay

## 2020-08-01 VITALS — BP 134/68 | HR 63 | Ht 68.0 in | Wt 232.0 lb

## 2020-08-01 DIAGNOSIS — Z7189 Other specified counseling: Secondary | ICD-10-CM | POA: Diagnosis not present

## 2020-08-01 DIAGNOSIS — I10 Essential (primary) hypertension: Secondary | ICD-10-CM | POA: Diagnosis not present

## 2020-08-01 DIAGNOSIS — R0602 Shortness of breath: Secondary | ICD-10-CM | POA: Diagnosis not present

## 2020-08-01 DIAGNOSIS — E785 Hyperlipidemia, unspecified: Secondary | ICD-10-CM

## 2020-08-01 DIAGNOSIS — I25119 Atherosclerotic heart disease of native coronary artery with unspecified angina pectoris: Secondary | ICD-10-CM | POA: Diagnosis not present

## 2020-08-01 NOTE — Patient Instructions (Signed)
Medication Instructions:  Your physician recommends that you continue on your current medications as directed. Please refer to the Current Medication list given to you today.  *If you need a refill on your cardiac medications before your next appointment, please call your pharmacy*   Lab Work: Pro BNP today  If you have labs (blood work) drawn today and your tests are completely normal, you will receive your results only by: Marland Kitchen MyChart Message (if you have MyChart) OR . A paper copy in the mail If you have any lab test that is abnormal or we need to change your treatment, we will call you to review the results.   Testing/Procedures: None   Follow-Up: At [facility], you and your health needs are our priority.  As part of our continuing mission to provide you with exceptional heart care, we have created designated Provider Care Teams.  These Care Teams include your primary Cardiologist (physician) and Advanced Practice Providers (APPs -  Physician Assistants and Nurse Practitioners) who all work together to provide you with the care you need, when you need it.  We recommend signing up for the patient portal called "MyChart".  Sign up information is provided on this After Visit Summary.  MyChart is used to connect with patients for Virtual Visits (Telemedicine).  Patients are able to view lab/test results, encounter notes, upcoming appointments, etc.  Non-urgent messages can be sent to your provider as well.   To learn more about what you can do with MyChart, go to [email or web address].    Your next appointment:   12 month(s)  The format for your next appointment:   In Person  Provider:   You may see Sinclair Grooms, MD or one of the following Advanced Practice Providers on your designated Care Team:    Truitt Merle, NP  Cecilie Kicks, NP  Kathyrn Drown, NP    Other Instructions

## 2020-08-02 LAB — PRO B NATRIURETIC PEPTIDE: NT-Pro BNP: 106 pg/mL (ref 0–486)

## 2020-10-03 ENCOUNTER — Other Ambulatory Visit: Payer: Self-pay | Admitting: Family Medicine

## 2020-10-19 ENCOUNTER — Other Ambulatory Visit: Payer: Self-pay

## 2020-10-19 ENCOUNTER — Ambulatory Visit (INDEPENDENT_AMBULATORY_CARE_PROVIDER_SITE_OTHER): Payer: Medicare Other

## 2020-10-19 DIAGNOSIS — Z23 Encounter for immunization: Secondary | ICD-10-CM | POA: Diagnosis not present

## 2020-10-19 NOTE — Progress Notes (Signed)
   [id number]  Name:  Joshua [name].    MRN: [id number] DOB: [birth year]  10/19/2020  Joshua [name] was observed post Covid-19 immunization for 15 minutes without incident. He was provided with Vaccine Information Sheet and instruction to access the V-Safe system.   Joshua [name] was instructed to call 911 with any severe reactions post vaccine: Marland Kitchen Difficulty breathing  . Swelling of face and throat  . A fast heartbeat  . A bad rash all over body  . Dizziness and weakness   Booster administered LD without complication.

## 2020-11-09 ENCOUNTER — Other Ambulatory Visit: Payer: Self-pay | Admitting: Family Medicine

## 2020-12-10 ENCOUNTER — Telehealth: Payer: Self-pay

## 2020-12-10 ENCOUNTER — Other Ambulatory Visit: Payer: Self-pay

## 2020-12-10 ENCOUNTER — Ambulatory Visit (INDEPENDENT_AMBULATORY_CARE_PROVIDER_SITE_OTHER): Payer: Medicare Other | Admitting: Family Medicine

## 2020-12-10 VITALS — BP 120/62 | HR 54 | Ht 68.0 in | Wt 231.0 lb

## 2020-12-10 DIAGNOSIS — R519 Headache, unspecified: Secondary | ICD-10-CM

## 2020-12-10 DIAGNOSIS — H532 Diplopia: Secondary | ICD-10-CM

## 2020-12-10 NOTE — Patient Instructions (Signed)
It was nice to meet today,  Because of your double vision and headaches, I would like to get an MRI of your brain to rule out serious condition such as stroke or a mass.  We will try to get that done today.  If there is a delay we may have you get a CT scan of your head prior to the MRI.  I have also ordered some blood test.  If you start to develop any weakness in any of your limbs or face or if the symptoms get worse please go to the emergency department  These schedule appointment for follow-up with your PCP or me in the next week.  Have a great day,  Clemetine Marker, MD

## 2020-12-10 NOTE — Progress Notes (Signed)
    SUBJECTIVE:   CHIEF COMPLAINT / HPI:   Headache/vision changes: since Saturday.   Headaches started sometime last week. They occur in the morning when he wakes up  They occur on his forehead above his eye brows.  He has been taking cloricidin hbp.  Takes it once a monring and the headaches go away. No history of migraines. No n/v, bright lights and sounds don't bother him. He takes baby aspirin, statin, and zetia.  Vision changes:  Started Friday after headaches had already started. . initially It would come and go.  Today it is all day.  It is double vision. He sees one object on top of the other. Vision is not doubled when he closes one eye. No eye pain with movement.  No spots or flashes in vision.  Had cataract surgery a few years back.  No recent head injuries.  No dizziness other than some unsteadiness due to diplopia.  No weakness in the extremities.     PERTINENT  PMH / PSH: CAD  OBJECTIVE:   BP 120/62   Pulse (!) 54   Ht $R'5\' 8"'ub$  (1.727 m)   Wt 231 lb (104.8 kg)   SpO2 95%   BMI 35.12 kg/m   Gen: alert.  Oriented.  No acute distress.  Accompanied by wife.  HEENT: no scalp tenderness.  No facial droop. PERRLA, EOMI. Peripheral vision intact.  Diplopia resolves with closing of one eye.  CV: RRR Pulm: LCTAB MSK: 5/5 strength in the upper and lower extremities bilaterally.  Neuro: CN 2-12 grossly intact.  Sensation intact.    ASSESSMENT/PLAN:   Diplopia 4 days of worsening binocular diplopia with consistent low grade frontal headache.  Concerning for intracranial pathology such as stroke, mass/tumor, intracranial bleed.  Giant cell arteritis also possible although headache is mild with no scalp tenderness.  Currently taking ASA, statin and zetia already. Will get stat MRI brain to look for intracranial pathology and check CRP and ESR to rule out GCA. If these are negative will refer to ophthalmology.        Benay Pike, MD [place]

## 2020-12-10 NOTE — Telephone Encounter (Signed)
Wife calls nurse line stating her husband shared with her he has been experiencing headaches and vision changes since Saturday. Wife reports his blood pressure has been within normal range, no red flags. Wife reports he does not want to go to UC or ED, she tried to take him over the weekend. Wife denies chest pain or SOB. They prefer to see PCP, however she has no clinic availability this week. Scheduled patient for this afternoon to be evaluated, however strict ED precautions given.

## 2020-12-11 ENCOUNTER — Other Ambulatory Visit: Payer: Self-pay | Admitting: Family Medicine

## 2020-12-11 ENCOUNTER — Ambulatory Visit (HOSPITAL_COMMUNITY)
Admission: RE | Admit: 2020-12-11 | Discharge: 2020-12-11 | Disposition: A | Discharge status: Home / Self Care | Payer: Medicare Other | Source: Ambulatory Visit | Attending: Family Medicine | Admitting: Family Medicine

## 2020-12-11 DIAGNOSIS — R519 Headache, unspecified: Secondary | ICD-10-CM | POA: Diagnosis not present

## 2020-12-11 DIAGNOSIS — H532 Diplopia: Secondary | ICD-10-CM | POA: Insufficient documentation

## 2020-12-11 LAB — SEDIMENTATION RATE: Sed Rate: 9 mm/hr (ref 0–30)

## 2020-12-11 LAB — C-REACTIVE PROTEIN: CRP: 2 mg/L (ref 0–10)

## 2020-12-11 MED ORDER — GADOBUTROL 1 MMOL/ML IV SOLN
10.0000 mL | Freq: Once | INTRAVENOUS | Status: AC | PRN
  Administered 2020-12-11: 10 mL via INTRAVENOUS

## 2020-12-11 NOTE — Assessment & Plan Note (Signed)
4 days of worsening binocular diplopia with consistent low grade frontal headache.  Concerning for intracranial pathology such as stroke, mass/tumor, intracranial bleed.  Giant cell arteritis also possible although headache is mild with no scalp tenderness.  Currently taking ASA, statin and zetia already. Will get stat MRI brain to look for intracranial pathology and check CRP and ESR to rule out GCA. If these are negative will refer to ophthalmology.

## 2020-12-12 DIAGNOSIS — H532 Diplopia: Secondary | ICD-10-CM | POA: Diagnosis not present

## 2020-12-12 DIAGNOSIS — Z961 Presence of intraocular lens: Secondary | ICD-10-CM | POA: Diagnosis not present

## 2020-12-17 ENCOUNTER — Ambulatory Visit: Payer: Medicare Other | Admitting: Family Medicine

## 2021-01-03 ENCOUNTER — Telehealth: Payer: Self-pay | Admitting: Interventional Cardiology

## 2021-01-03 NOTE — Telephone Encounter (Signed)
Spoke with wife, DPR on file.  Pt was in the background to answer questions.  Wife states this week pt has been having issues with chest tightness, especially when he bends over trying to put his shoes on.  Pt states it feels like someone is squeezing his chest.  Episodes last different durations.  Worst episode was Wednesday and lasted about 40 mins.  Has SOB with exertion but wife states they are pretty sedative and aren't active much.  Pt able to go outside and play with grandson with no problems.  Denies arm/jaw/neck pain, dizziness, lightheadedness, sweating, or nausea.  Pt states the tightness is similar to what was happening prior to previous heart cath.  BPs have been good at 130/76, 143/78.  Denies discomfort at this time.  Really only occurs in the morning.  Has not tried Nitro during these episodes.  Currently on Imdur 90mg  QD. Scheduled pt to come in to see Truitt Merle, NP on 2/8.  Reviewed ER precautions.  Advised wife to have pt try a nitro the next time this discomfort occurs to see if it helps.  Advised I will send to Dr. Tamala Julian to see if he has further recommendations and would call if he did, otherwise we will plan to see pt on Tuesday next week.

## 2021-01-03 NOTE — Telephone Encounter (Signed)
    Pt c/o of Chest Pain: STAT if CP now or developed within 24 hours  1. Are you having CP right now? No  2. Are you experiencing any other symptoms (ex. SOB, nausea, vomiting, sweating)? SOB  3. How long have you been experiencing CP? This week, mostly in the mornings  4. Is your CP continuous or coming and going? Coming and going  5. Have you taken Nitroglycerin? Not yet  Pt's wife said, pt been feeling chest tightness mostly in the morning when he gets up and bend to put on his shoes. The chest tightness will last for 40 mins. Pt's wife added, when their grandson is there, pt plays with him and picks him up and doesn't have issue.

## 2021-01-04 ENCOUNTER — Emergency Department (HOSPITAL_COMMUNITY): Payer: Medicare Other

## 2021-01-04 ENCOUNTER — Encounter (HOSPITAL_COMMUNITY)
Admission: EM | Disposition: A | Discharge status: Home / Self Care | Payer: Self-pay | Source: Home / Self Care | Attending: Cardiovascular Disease

## 2021-01-04 ENCOUNTER — Inpatient Hospital Stay (HOSPITAL_COMMUNITY)
Admission: EM | Admit: 2021-01-04 | Discharge: 2021-01-06 | DRG: 287 | Disposition: A | Discharge status: Home / Self Care | Payer: Medicare Other | Attending: Cardiology | Admitting: Cardiology

## 2021-01-04 ENCOUNTER — Other Ambulatory Visit (HOSPITAL_COMMUNITY): Payer: Medicare Other

## 2021-01-04 ENCOUNTER — Other Ambulatory Visit: Payer: Self-pay

## 2021-01-04 ENCOUNTER — Encounter (HOSPITAL_COMMUNITY): Payer: Self-pay

## 2021-01-04 DIAGNOSIS — Z955 Presence of coronary angioplasty implant and graft: Secondary | ICD-10-CM | POA: Diagnosis not present

## 2021-01-04 DIAGNOSIS — E669 Obesity, unspecified: Secondary | ICD-10-CM | POA: Diagnosis present

## 2021-01-04 DIAGNOSIS — Z951 Presence of aortocoronary bypass graft: Secondary | ICD-10-CM

## 2021-01-04 DIAGNOSIS — I4510 Unspecified right bundle-branch block: Secondary | ICD-10-CM | POA: Diagnosis not present

## 2021-01-04 DIAGNOSIS — Z882 Allergy status to sulfonamides status: Secondary | ICD-10-CM

## 2021-01-04 DIAGNOSIS — Z79899 Other long term (current) drug therapy: Secondary | ICD-10-CM | POA: Diagnosis not present

## 2021-01-04 DIAGNOSIS — K219 Gastro-esophageal reflux disease without esophagitis: Secondary | ICD-10-CM | POA: Diagnosis present

## 2021-01-04 DIAGNOSIS — I252 Old myocardial infarction: Secondary | ICD-10-CM

## 2021-01-04 DIAGNOSIS — Z8249 Family history of ischemic heart disease and other diseases of the circulatory system: Secondary | ICD-10-CM

## 2021-01-04 DIAGNOSIS — Z9841 Cataract extraction status, right eye: Secondary | ICD-10-CM | POA: Diagnosis not present

## 2021-01-04 DIAGNOSIS — Z6833 Body mass index (BMI) 33.0-33.9, adult: Secondary | ICD-10-CM

## 2021-01-04 DIAGNOSIS — R0602 Shortness of breath: Secondary | ICD-10-CM | POA: Diagnosis not present

## 2021-01-04 DIAGNOSIS — R0789 Other chest pain: Secondary | ICD-10-CM | POA: Diagnosis not present

## 2021-01-04 DIAGNOSIS — E7800 Pure hypercholesterolemia, unspecified: Secondary | ICD-10-CM | POA: Diagnosis not present

## 2021-01-04 DIAGNOSIS — I25110 Atherosclerotic heart disease of native coronary artery with unstable angina pectoris: Secondary | ICD-10-CM | POA: Diagnosis present

## 2021-01-04 DIAGNOSIS — Z7982 Long term (current) use of aspirin: Secondary | ICD-10-CM | POA: Diagnosis not present

## 2021-01-04 DIAGNOSIS — I25709 Atherosclerosis of coronary artery bypass graft(s), unspecified, with unspecified angina pectoris: Secondary | ICD-10-CM

## 2021-01-04 DIAGNOSIS — Z961 Presence of intraocular lens: Secondary | ICD-10-CM | POA: Diagnosis not present

## 2021-01-04 DIAGNOSIS — I249 Acute ischemic heart disease, unspecified: Secondary | ICD-10-CM | POA: Diagnosis not present

## 2021-01-04 DIAGNOSIS — Z888 Allergy status to other drugs, medicaments and biological substances status: Secondary | ICD-10-CM

## 2021-01-04 DIAGNOSIS — I25119 Atherosclerotic heart disease of native coronary artery with unspecified angina pectoris: Secondary | ICD-10-CM | POA: Diagnosis not present

## 2021-01-04 DIAGNOSIS — I200 Unstable angina: Secondary | ICD-10-CM | POA: Diagnosis present

## 2021-01-04 DIAGNOSIS — I10 Essential (primary) hypertension: Secondary | ICD-10-CM | POA: Diagnosis present

## 2021-01-04 DIAGNOSIS — I25710 Atherosclerosis of autologous vein coronary artery bypass graft(s) with unstable angina pectoris: Principal | ICD-10-CM | POA: Diagnosis present

## 2021-01-04 DIAGNOSIS — Z20822 Contact with and (suspected) exposure to covid-19: Secondary | ICD-10-CM | POA: Diagnosis not present

## 2021-01-04 DIAGNOSIS — E785 Hyperlipidemia, unspecified: Secondary | ICD-10-CM | POA: Diagnosis not present

## 2021-01-04 DIAGNOSIS — R079 Chest pain, unspecified: Secondary | ICD-10-CM | POA: Diagnosis not present

## 2021-01-04 DIAGNOSIS — I2510 Atherosclerotic heart disease of native coronary artery without angina pectoris: Secondary | ICD-10-CM | POA: Diagnosis present

## 2021-01-04 DIAGNOSIS — Z9842 Cataract extraction status, left eye: Secondary | ICD-10-CM | POA: Diagnosis not present

## 2021-01-04 HISTORY — PX: LEFT HEART CATH AND CORS/GRAFTS ANGIOGRAPHY: CATH118250

## 2021-01-04 LAB — CBC
HCT: 43.1 % (ref 39.0–52.0)
HCT: 41.3 % (ref 39.0–52.0)
HCT: 40.9 % (ref 39.0–52.0)
Hemoglobin: 14.1 g/dL (ref 13.0–17.0)
Hemoglobin: 14.3 g/dL (ref 13.0–17.0)
Hemoglobin: 13.5 g/dL (ref 13.0–17.0)
MCH: 29 pg (ref 26.0–34.0)
MCH: 30.2 pg (ref 26.0–34.0)
MCH: 28.6 pg (ref 26.0–34.0)
MCHC: 32.7 g/dL (ref 30.0–36.0)
MCHC: 34.6 g/dL (ref 30.0–36.0)
MCHC: 33 g/dL (ref 30.0–36.0)
MCV: 88.7 fL (ref 80.0–100.0)
MCV: 87.3 fL (ref 80.0–100.0)
MCV: 86.7 fL (ref 80.0–100.0)
Platelets: 145 10*3/uL — ABNORMAL LOW (ref 150–400)
Platelets: 165 10*3/uL (ref 150–400)
Platelets: 154 10*3/uL (ref 150–400)
RBC: 4.86 MIL/uL (ref 4.22–5.81)
RBC: 4.73 MIL/uL (ref 4.22–5.81)
RBC: 4.72 MIL/uL (ref 4.22–5.81)
RDW: 13 % (ref 11.5–15.5)
RDW: 13.2 % (ref 11.5–15.5)
RDW: 13 % (ref 11.5–15.5)
WBC: 6.5 10*3/uL (ref 4.0–10.5)
WBC: 6.1 10*3/uL (ref 4.0–10.5)
WBC: 6.5 10*3/uL (ref 4.0–10.5)
nRBC: 0 % (ref 0.0–0.2)
nRBC: 0 % (ref 0.0–0.2)
nRBC: 0 % (ref 0.0–0.2)

## 2021-01-04 LAB — SARS CORONAVIRUS 2 BY RT PCR (HOSPITAL ORDER, PERFORMED IN ~~LOC~~ HOSPITAL LAB): SARS Coronavirus 2: NEGATIVE

## 2021-01-04 LAB — BASIC METABOLIC PANEL
Anion gap: 11 (ref 5–15)
BUN: 16 mg/dL (ref 8–23)
CO2: 24 mmol/L (ref 22–32)
Calcium: 9.3 mg/dL (ref 8.9–10.3)
Chloride: 102 mmol/L (ref 98–111)
Creatinine, Ser: 0.99 mg/dL (ref 0.61–1.24)
GFR, Estimated: >60 mL/min (ref >60)
Glucose, Bld: 123 mg/dL — ABNORMAL HIGH (ref 70–99)
Potassium: 4 mmol/L (ref 3.5–5.1)
Sodium: 137 mmol/L (ref 135–145)

## 2021-01-04 LAB — CREATININE, SERUM
Creatinine, Ser: 0.93 mg/dL (ref 0.61–1.24)
GFR, Estimated: >60 mL/min (ref >60)

## 2021-01-04 LAB — TROPONIN I (HIGH SENSITIVITY)
Troponin I (High Sensitivity): 12 ng/L (ref <18)
Troponin I (High Sensitivity): 11 ng/L (ref <18)

## 2021-01-04 LAB — HEMOGLOBIN A1C
Hgb A1c MFr Bld: 6.5 % — ABNORMAL HIGH (ref 4.8–5.6)
Mean Plasma Glucose: 139.85 mg/dL

## 2021-01-04 LAB — MAGNESIUM: Magnesium: 1.8 mg/dL (ref 1.7–2.4)

## 2021-01-04 LAB — TSH: TSH: 3.057 u[IU]/mL (ref 0.350–4.500)

## 2021-01-04 LAB — T4, FREE: Free T4: 0.68 ng/dL (ref 0.61–1.12)

## 2021-01-04 SURGERY — LEFT HEART CATH AND CORS/GRAFTS ANGIOGRAPHY
Anesthesia: LOCAL

## 2021-01-04 MED ORDER — ASPIRIN EC 81 MG PO TBEC
81.0000 mg | DELAYED_RELEASE_TABLET | Freq: Every day | ORAL | Status: DC
  Administered 2021-01-05 – 2021-01-06 (×2): 81 mg via ORAL
  Filled 2021-01-04 (×2): qty 1

## 2021-01-04 MED ORDER — HEPARIN SODIUM (PORCINE) 5000 UNIT/ML IJ SOLN
5000.0000 [IU] | Freq: Three times a day (TID) | INTRAMUSCULAR | Status: DC
  Administered 2021-01-04 – 2021-01-06 (×5): 5000 [IU] via SUBCUTANEOUS
  Filled 2021-01-04 (×5): qty 1

## 2021-01-04 MED ORDER — ATENOLOL-CHLORTHALIDONE 100-25 MG PO TABS: 1.0000 | ORAL_TABLET | Freq: Every day | ORAL | Status: DC

## 2021-01-04 MED ORDER — ASPIRIN 81 MG PO CHEW
324.0000 mg | CHEWABLE_TABLET | Freq: Once | ORAL | Status: AC
  Administered 2021-01-04: 324 mg via ORAL
  Filled 2021-01-04: qty 4

## 2021-01-04 MED ORDER — HEPARIN SODIUM (PORCINE) 5000 UNIT/ML IJ SOLN
5000.0000 [IU] | Freq: Three times a day (TID) | INTRAMUSCULAR | Status: DC
  Administered 2021-01-04: 5000 [IU] via SUBCUTANEOUS
  Filled 2021-01-04: qty 1

## 2021-01-04 MED ORDER — ASPIRIN 81 MG PO CHEW: 81.0000 mg | CHEWABLE_TABLET | Freq: Every day | ORAL | Status: DC

## 2021-01-04 MED ORDER — SODIUM CHLORIDE 0.9% FLUSH
3.0000 mL | Freq: Two times a day (BID) | INTRAVENOUS | Status: DC
  Administered 2021-01-04: 3 mL via INTRAVENOUS

## 2021-01-04 MED ORDER — SODIUM CHLORIDE 0.9 % IV SOLN
INTRAVENOUS | Status: DC
Start: 2021-01-04 — End: 2021-01-04

## 2021-01-04 MED ORDER — SODIUM CHLORIDE 0.9% FLUSH
3.0000 mL | Freq: Two times a day (BID) | INTRAVENOUS | Status: DC
  Administered 2021-01-05: 3 mL via INTRAVENOUS

## 2021-01-04 MED ORDER — MIDAZOLAM HCL 2 MG/2ML IJ SOLN
INTRAMUSCULAR | Status: DC | PRN
  Administered 2021-01-04: 1 mg via INTRAVENOUS

## 2021-01-04 MED ORDER — CHLORTHALIDONE 25 MG PO TABS
25.0000 mg | ORAL_TABLET | Freq: Every day | ORAL | Status: DC
  Administered 2021-01-05 – 2021-01-06 (×2): 25 mg via ORAL
  Filled 2021-01-04 (×2): qty 1

## 2021-01-04 MED ORDER — HEPARIN (PORCINE) IN NACL 1000-0.9 UT/500ML-% IV SOLN
INTRAVENOUS | Status: DC | PRN
  Administered 2021-01-04: 500 mL

## 2021-01-04 MED ORDER — GABAPENTIN 300 MG PO CAPS
300.0000 mg | ORAL_CAPSULE | Freq: Three times a day (TID) | ORAL | Status: DC
  Administered 2021-01-04 – 2021-01-06 (×6): 300 mg via ORAL
  Filled 2021-01-04 (×6): qty 1

## 2021-01-04 MED ORDER — TICAGRELOR 90 MG PO TABS
180.0000 mg | ORAL_TABLET | Freq: Once | ORAL | Status: AC
  Administered 2021-01-04: 180 mg via ORAL
  Filled 2021-01-04: qty 2

## 2021-01-04 MED ORDER — SODIUM CHLORIDE 0.9 % IV SOLN: INTRAVENOUS | Status: DC

## 2021-01-04 MED ORDER — ONDANSETRON HCL 4 MG/2ML IJ SOLN: 4.0000 mg | Freq: Four times a day (QID) | INTRAMUSCULAR | Status: DC | PRN

## 2021-01-04 MED ORDER — HEPARIN (PORCINE) IN NACL 1000-0.9 UT/500ML-% IV SOLN
INTRAVENOUS | Status: AC
  Filled 2021-01-04: qty 1000

## 2021-01-04 MED ORDER — SODIUM CHLORIDE 0.9 % IV SOLN: INTRAVENOUS | Status: AC

## 2021-01-04 MED ORDER — FENTANYL CITRATE (PF) 100 MCG/2ML IJ SOLN
INTRAMUSCULAR | Status: DC | PRN
  Administered 2021-01-04: 50 ug via INTRAVENOUS

## 2021-01-04 MED ORDER — SODIUM CHLORIDE 0.9 % IV SOLN: 250.0000 mL | INTRAVENOUS | Status: DC | PRN

## 2021-01-04 MED ORDER — HYDRALAZINE HCL 20 MG/ML IJ SOLN: 10.0000 mg | INTRAMUSCULAR | Status: AC | PRN

## 2021-01-04 MED ORDER — MIDAZOLAM HCL 2 MG/2ML IJ SOLN
INTRAMUSCULAR | Status: AC
  Filled 2021-01-04: qty 2

## 2021-01-04 MED ORDER — SODIUM CHLORIDE 0.9% FLUSH
3.0000 mL | INTRAVENOUS | Status: DC | PRN
Start: 2021-01-04 — End: 2021-01-06

## 2021-01-04 MED ORDER — NITROGLYCERIN 0.4 MG SL SUBL: 0.4000 mg | SUBLINGUAL_TABLET | SUBLINGUAL | Status: DC | PRN

## 2021-01-04 MED ORDER — ZOLPIDEM TARTRATE 5 MG PO TABS
5.0000 mg | ORAL_TABLET | Freq: Every evening | ORAL | Status: DC | PRN
  Filled 2021-01-04: qty 1

## 2021-01-04 MED ORDER — ATENOLOL 25 MG PO TABS
100.0000 mg | ORAL_TABLET | Freq: Every day | ORAL | Status: DC
  Administered 2021-01-05 – 2021-01-06 (×2): 100 mg via ORAL
  Filled 2021-01-04 (×2): qty 4

## 2021-01-04 MED ORDER — SODIUM CHLORIDE 0.9% FLUSH: 3.0000 mL | INTRAVENOUS | Status: DC | PRN

## 2021-01-04 MED ORDER — ACETAMINOPHEN 325 MG PO TABS: 650.0000 mg | ORAL_TABLET | ORAL | Status: DC | PRN

## 2021-01-04 MED ORDER — ALPRAZOLAM 0.25 MG PO TABS: 0.2500 mg | ORAL_TABLET | Freq: Every evening | ORAL | Status: DC | PRN

## 2021-01-04 MED ORDER — FENTANYL CITRATE (PF) 100 MCG/2ML IJ SOLN
INTRAMUSCULAR | Status: AC
  Filled 2021-01-04: qty 2

## 2021-01-04 MED ORDER — LABETALOL HCL 5 MG/ML IV SOLN: 10.0000 mg | INTRAVENOUS | Status: AC | PRN

## 2021-01-04 MED ORDER — LIDOCAINE HCL (PF) 1 % IJ SOLN
INTRAMUSCULAR | Status: AC
  Filled 2021-01-04: qty 30

## 2021-01-04 MED ORDER — CITALOPRAM HYDROBROMIDE 20 MG PO TABS
20.0000 mg | ORAL_TABLET | Freq: Every day | ORAL | Status: DC
  Administered 2021-01-04 – 2021-01-06 (×3): 20 mg via ORAL
  Filled 2021-01-04 (×2): qty 1
  Filled 2021-01-04: qty 2

## 2021-01-04 MED ORDER — LIDOCAINE HCL (PF) 1 % IJ SOLN
INTRAMUSCULAR | Status: DC | PRN
  Administered 2021-01-04: 26 mL

## 2021-01-04 MED ORDER — ASPIRIN 81 MG PO CHEW: 81.0000 mg | CHEWABLE_TABLET | ORAL | Status: DC

## 2021-01-04 MED ORDER — IOHEXOL 350 MG/ML SOLN
INTRAVENOUS | Status: DC | PRN
  Administered 2021-01-04: 130 mL

## 2021-01-04 MED ORDER — EZETIMIBE 10 MG PO TABS
10.0000 mg | ORAL_TABLET | Freq: Every day | ORAL | Status: DC
  Administered 2021-01-04 – 2021-01-06 (×3): 10 mg via ORAL
  Filled 2021-01-04 (×3): qty 1

## 2021-01-04 MED ORDER — PANTOPRAZOLE SODIUM 40 MG PO TBEC
40.0000 mg | DELAYED_RELEASE_TABLET | Freq: Every day | ORAL | Status: DC
  Administered 2021-01-05 – 2021-01-06 (×2): 40 mg via ORAL
  Filled 2021-01-04 (×2): qty 1

## 2021-01-04 MED ORDER — SIMVASTATIN 20 MG PO TABS
40.0000 mg | ORAL_TABLET | Freq: Every day | ORAL | Status: DC
  Administered 2021-01-04 – 2021-01-06 (×3): 40 mg via ORAL
  Filled 2021-01-04 (×3): qty 2

## 2021-01-04 MED ORDER — ISOSORBIDE MONONITRATE ER 60 MG PO TB24
90.0000 mg | ORAL_TABLET | Freq: Every day | ORAL | Status: DC
  Administered 2021-01-04 – 2021-01-05 (×2): 90 mg via ORAL
  Filled 2021-01-04: qty 3
  Filled 2021-01-04: qty 1

## 2021-01-04 MED ORDER — IOHEXOL 350 MG/ML SOLN
INTRAVENOUS | Status: AC
  Filled 2021-01-04: qty 1

## 2021-01-04 SURGICAL SUPPLY — 15 items
BAG SNAP BAND KOVER 36X36 (MISCELLANEOUS) ×1 IMPLANT
CATH EXPO 5F MPA-1 (CATHETERS) ×1 IMPLANT
CATH INFINITI 5 FR IM (CATHETERS) ×1 IMPLANT
CATH INFINITI 5FR AL1 (CATHETERS) ×1 IMPLANT
CATH INFINITI 5FR MPB2 (CATHETERS) ×1 IMPLANT
CLOSURE MYNX CONTROL 5F (Vascular Products) ×1 IMPLANT
COVER DOME SNAP 22 D (MISCELLANEOUS) ×1 IMPLANT
GUIDEWIRE ANGLED .035X150CM (WIRE) ×1 IMPLANT
KIT HEART LEFT (KITS) ×2 IMPLANT
PACK CARDIAC CATHETERIZATION (CUSTOM PROCEDURE TRAY) ×2 IMPLANT
SHEATH PINNACLE 5F 10CM (SHEATH) ×1 IMPLANT
SHEATH PROBE COVER 6X72 (BAG) ×1 IMPLANT
TRANSDUCER W/STOPCOCK (MISCELLANEOUS) ×2 IMPLANT
TUBING CIL FLEX 10 FLL-RA (TUBING) ×2 IMPLANT
WIRE EMERALD 3MM-J .035X150CM (WIRE) ×1 IMPLANT

## 2021-01-04 NOTE — ED Notes (Signed)
Paged cardiology to clarify NS orders

## 2021-01-04 NOTE — Telephone Encounter (Signed)
Pt c/o of Chest Pain: STAT if CP now or developed within 24 hours  1. Are you having CP right now? No   2. Are you experiencing any other symptoms (ex. SOB, nausea, vomiting, sweating)? No   3. How long have you been experiencing CP? Patient's wife states the patient has been experiencing tightness and aches for several days this week.   4. Is your CP continuous or coming and going? Coming and going  5. Have you taken Nitroglycerin? Yes, patient's wife states this morning the patient took Nitroglycerin and he felt better within 5 minutes. ?

## 2021-01-04 NOTE — Interval H&P Note (Signed)
Cath Lab Visit (complete for each Cath Lab visit)  Clinical Evaluation Leading to the Procedure:   ACS: Yes.    Non-ACS:    Anginal Classification: CCS [name]  Anti-ischemic medical therapy: Maximal Therapy (2 or more classes of medications)  Non-Invasive Test Results: No non-invasive testing performed  Prior CABG: Previous CABG      History and Physical Interval Note:  01/04/2021 5:03 PM  Joshua [name].  has presented today for surgery, with the diagnosis of unstable angina.  The various methods of treatment have been discussed with the patient and family. After consideration of risks, benefits and other options for treatment, the patient has consented to  Procedure(s): LEFT HEART CATH AND CORS/GRAFTS ANGIOGRAPHY (N/A) as a surgical intervention.  The patient's history has been reviewed, patient examined, no change in status, stable for surgery.  I have reviewed the patient's chart and labs.  Questions were answered to the patient's satisfaction.     Joshua [name]

## 2021-01-04 NOTE — CV Procedure (Signed)
   Patent RIMA to the LAD.  Patent LIMA to the obtuse marginal.  Unable to selectively engage the SVG to the distal right coronary due to overhanging ostial stents.  The vessel is patent without evidence of significant ostial or mid to distal vessel narrowing.  The insertion site into the PDA/distal RCA was not well visualized.  SVG to diagonal is occluded  Native right coronary is totally occluded  Left main is totally occluded  LVEF is approximately 50 to 55% with normal LVEDP  Assumption is that angina is likely related to the right coronary territory although he does have some diffuse disease in the native LAD beyond the graft insertion site.  Medical therapy is the treatment of choice.  Uptitrate nitrate therapy if possible.

## 2021-01-04 NOTE — ED Notes (Signed)
Pt ambulated to RR with wife. Pt walked good

## 2021-01-04 NOTE — H&P (Addendum)
Cardiology Admission History and Physical:   Patient ID: Joshua [name]. MRN: [id number]; DOB: [birth year]   Admission date: 01/04/2021  Primary Care Provider: Dickie La, MD [address] [facility] Cardiologist: Sinclair Grooms, MD  [place] Electrophysiologist:  None   Chief Complaint:  Chest pain   Patient Profile:   Joshua [name]. is a 80 y.o. male with hx CAD with CABG 1990 and then in 2011 PCI to VG-RCA with bare-metal stent.  2018 with NSTEMI and DES X2 to VG to RCA, other hx of HLD, HTN and obesity. .   History of Present Illness:   Joshua [name] with above hx of CAD and CABG X 4 22 years ago with RIMA-LAD, LIMA to LCX, VG to diag and VG to RCA. Stents to VG to RCA bare metal and last cath 06/24/17 with NSTEMI due to obstruction of VG to RCA with in stent restenosis,  Totally occluded VG tp diag. Patent RIMA to LAD and patent LIMA to OM, PCI to in stent restenosis of VG to RCA.  Normal LV function.  He had DOE and nuc study 05/23/19 with normal nuc study.  His imdur was increased.   Echo 2018 with EF 55-60%, mild MR, LA mildly dilated and PA pk pressure was 43 mmHg   Pt had called office with chest tightness especillay when bending to put on shoes.  Felt like someone squeezing chest.   Episode WED lasted 40 min.  He has chronic DOE.  The tightness reminds him of prior need for cath.  No associated symptoms.  Plan to try NTG if reoccurred and was to see Dr. Tamala Julian next week.    Today pain occurred and would come and go.  He took sl NTG and he was better in 5 min.  Out office asked him to go to ER.   Na 137, K+ 4.0 BUN 16, Cr 0.99  Hs troponin 12 hgb 14, plts 145 WBC 6.5  2V CXR NAD  EKG:  The ECG that was done today 01/04/21 was personally reviewed and demonstrates SR to SB with PACs and PVCs and old RBBB no acute ST changes.    BP 152/70 P 50s R 22 sp02 on RA 96%  Afebrile.  No pain since the NTG.    Past Medical History:  Diagnosis Date  . Coronary artery disease  involving native coronary artery of native heart with angina pectoris ([place])    a. 1990 s/p CABG x 4 (RIMA->LAD, LIMA->LCX, VG->Diag, VG->RCA); b. 2011 s/p BMS to VG->RCA.; c. Unstable Angina 05/2017: Patent RIMA-LAD & LIMA-LCx, ostSVG-RCA 65% ISR & mSVG-RCA  99% - DES PCI to both // Myoview 05/2019:  EF 57, no ischemia or scar, Low Risk   . GERD (gastroesophageal reflux disease)   . History of echocardiogram    a. 01/2017 Echo: EF 55-60%, no rwma, mild MR, midlly dil LA, PASP 38mmHg.  Marland Kitchen History of hiatal hernia   . Hyperlipidemia   . Hypertension   . Obesity     Past Surgical History:  Procedure Laterality Date  . CATARACT EXTRACTION W/ INTRAOCULAR LENS  IMPLANT, BILATERAL Bilateral   . CORONARY ANGIOPLASTY WITH STENT PLACEMENT  2011   "RCA"  . CORONARY ARTERY BYPASS GRAFT  1990   CABG X4  . CORONARY STENT INTERVENTION N/A 06/24/2017   Procedure: Coronary Stent Intervention;  Surgeon: Belva Crome, MD;  Location: [place] [facility];  Service: Cardiovascular: Ostial 4.0 x 12 mm Onyx DES reducing  70% stenosis to less than 40%. Distal body 3.5 x 15 Onyx DES reducing 99% stenosis to less than 30%.  Marland Kitchen LEFT HEART CATH AND CORS/GRAFTS ANGIOGRAPHY N/A 06/24/2017   Procedure: Left Heart Cath and Cors/Grafts Angiography;  Surgeon: Belva Crome, MD;  Location: [place] [facility];  Service: Cardiovascular:  CTO native RCA, LAD & LCx (Graft Dependent). Patent LIMA-LCx & RIMA-LAD.  SVG-RCA: ost 65% ISR & mid 99% --> DES PCI to both     Medications Prior to Admission: Prior to Admission medications   Medication Sig Start Date End Date Taking? Authorizing Provider  ALPRAZolam Duanne Moron) 0.5 MG tablet TAKE 1 TABLET BY MOUTH AT  BEDTIME AS NEEDED FOR  RESTLESS LEGS 10/03/20   Dickie La, MD  aspirin EC 81 MG EC tablet Take 1 tablet (81 mg total) by mouth daily. 06/26/17   Cheryln Manly, NP  atenolol-chlorthalidone (TENORETIC) 100-25 MG tablet TAKE 1 TABLET BY MOUTH  DAILY 07/03/20   Dickie La, MD   citalopram (CELEXA) 20 MG tablet TAKE 1 TABLET BY MOUTH  DAILY 07/03/20   Dickie La, MD  esomeprazole (NEXIUM) 20 MG capsule Take 1 capsule (20 mg total) by mouth daily. 12/07/18   Dickie La, MD  ezetimibe (ZETIA) 10 MG tablet TAKE 1 TABLET BY MOUTH  DAILY 07/03/20   Dickie La, MD  gabapentin (NEURONTIN) 300 MG capsule Take 300 mg by mouth 3 (three) times daily. 2 tab in the morning, 1 at noon, and 2 at bedtime    [provider]  isosorbide mononitrate (IMDUR) 30 MG 24 hr tablet TAKE 3 TABLETS BY MOUTH  DAILY 04/04/20   Dickie La, MD  Loratadine (CLARITIN) 10 MG CAPS Take 1 capsule by mouth daily as needed. 03/02/20   [provider]  nitroGLYCERIN (NITROSTAT) 0.4 MG SL tablet Place 1 tablet (0.4 mg total) under the tongue every 5 (five) minutes x 3 doses as needed for chest pain. 12/06/18   Belva Crome, MD  simvastatin (ZOCOR) 40 MG tablet TAKE 1 TABLET BY MOUTH  DAILY 11/12/20   Dickie La, MD     Allergies:    Allergies  Allergen Reactions  . Ace Inhibitors Cough  . Atorvastatin Other (See Comments)    Muscle pain  . Rosuvastatin Other (See Comments)    Muscle pain  . Plavix [Clopidogrel Bisulfate] Itching and Rash  . Sulfa Antibiotics Itching and Rash  . Sulfonamide Derivatives Itching and Rash    Social History:   Social History   Socioeconomic History  . Marital status: Married    Spouse name: Not on file  . Number of children: Not on file  . Years of education: Not on file  . Highest education level: Not on file  Occupational History  . Occupation: retired    Comment: Retail buyer (truck driver, heating and air)  Tobacco Use  . Smoking status: Never Smoker  . Smokeless tobacco: Former Systems developer    Types: Secondary school teacher  . Vaping Use: Never used  Substance and Sexual Activity  . Alcohol use: Yes    Alcohol/week: 0.0 standard drinks    Comment: 06/23/2017 "a few drinks/ month  . Drug use: No  . Sexual activity: Not Currently  Other Topics  Concern  . Not on file  Social History Narrative   Lives locally with his wife [name] - former [employer] ECG tech).  Does not routinely exercise.   Social Determinants of Health  Financial Resource Strain: Not on file  Food Insecurity: Not on file  Transportation Needs: Not on file  Physical Activity: Not on file  Stress: Not on file  Social Connections: Not on file  Intimate Partner Violence: Not on file    Family History:   The patient's family history includes Cancer in his mother; Coronary artery disease in his father; Heart attack in his father; Hyperlipidemia in his son; Hypertension in his son.    ROS:  Please see the history of present illness.  General:no colds or fevers, no weight changes Skin:no rashes or ulcers HEENT:no blurred vision, no congestion CV:see HPI PUL:see HPI GI:no diarrhea constipation or melena, no indigestion GU:no hematuria, no dysuria MS:no joint pain, no claudication Neuro:no syncope, no lightheadedness Endo:no diabetes, no thyroid disease  All other ROS reviewed and negative.     Physical Exam/Data:   Vitals:   01/04/21 1200 01/04/21 1215 01/04/21 1230 01/04/21 1245  BP: (!) 142/71 (!) 144/78 (!) 158/58 (!) 152/65  Pulse:  (!) 54 (!) 51 (!) 52  Resp:  (!) 23 (!) 24 (!) 23  Temp:      TempSrc:      SpO2:  96% 96% 97%  Weight:      Height:       No intake or output data in the 24 hours ending 01/04/21 1333 Last 3 Weights 01/04/2021 12/10/2020 08/01/2020  Weight (lbs) 223 lb 231 lb 232 lb  Weight (kg) 101.152 kg 104.781 kg 105.235 kg     Body mass index is 33.91 kg/m.  General:  Well nourished, well developed, in no acute distress overall feels better HEENT: normal Lymph: no adenopathy Neck: no JVD Endocrine:  No thryomegaly Vascular: No carotid bruits; pedal pulses 1+ bilaterally   Cardiac:  normal S1, S2; RRR; no murmur gallup rub or click Lungs:  clear to auscultation bilaterally, no wheezing, rhonchi or rales  Abd: soft, nontender,  no hepatomegaly  Ext: no lower ext edema to a trace Musculoskeletal:  No deformities, BUE and BLE strength normal and equal Skin: warm and dry  Neuro:  Alert and oriented X 3 MAE follows commands, no focal abnormalities noted Psych:  Normal affect    Relevant CV Studies: 06/24/17 cardiac cath  Acute coronary syndrome/non-ST elevation myocardial infarction due to high-grade obstruction in the saphenous vein graft to the right coronary. In-stent restenosis in the proximal/ostial RCA graft with 60-70% narrowing. 99% distal graft obstruction. The graft is heavily calcified and is 78 years old.  Total occlusion of saphenous vein graft to the diagonal  Patent right internal mammary graft to the LAD  Patent left internal mammary graft to the obtuse marginal  Successful 2 site intervention on the SVG to the right coronary with distal protection using 4.0 spider.  Ostial 4.0 x 12 mm Onyx DES reducing 70% stenosis to less than 40%. Distal body 3.5 x 15 Onyx DES reducing 99% stenosis to less than 30%.  Normal LV function with normal filling pressures.  RECOMMENDATIONS:  Brilinta and aspirin for at least a year  Risk factor modification  Potential discharge in a.m.  Angio-Seal access closure without complications.   Echo 02/26/17   Study Conclusions   - Procedure narrative: Transthoracic echocardiography. Image  quality was suboptimal. The study was technically difficult.  Intravenous contrast (Definity) was administered.  - Left ventricle: The cavity size was normal. Systolic function was  normal. The estimated ejection fraction was in the range of 55%  to 60%. Wall motion was  normal; there were no regional wall  motion abnormalities. Left ventricular diastolic function  parameters were normal.  - Mitral valve: There was mild regurgitation.  - Left atrium: The atrium was mildly dilated.  - Atrial septum: No defect or patent foramen ovale was identified.  - Pulmonary  arteries: PA peak pressure: 43 mm Hg (S).    nuc study 05/23/19 Study Highlights   Nuclear stress EF: 57%.  Normal perfusion No ischemia or scar  This is a low risk study   Laboratory Data:  High Sensitivity Troponin:   Recent Labs  Lab 01/04/21 0953 01/04/21 1222  TROPONINIHS 12 11      Chemistry Recent Labs  Lab 01/04/21 0953  NA 137  K 4.0  CL 102  CO2 24  GLUCOSE 123*  BUN 16  CREATININE 0.99  CALCIUM 9.3  GFRNONAA >60  ANIONGAP 11    No results for input(s): PROT, ALBUMIN, AST, ALT, ALKPHOS, BILITOT in the last 168 hours. Hematology Recent Labs  Lab 01/04/21 0953  WBC 6.5  RBC 4.86  HGB 14.1  HCT 43.1  MCV 88.7  MCH 29.0  MCHC 32.7  RDW 13.0  PLT 145*   BNPNo results for input(s): BNP, PROBNP in the last 168 hours.  DDimer No results for input(s): DDIMER in the last 168 hours.   Radiology/Studies:  DG Chest 2 View  Result Date: 01/04/2021 CLINICAL DATA:  Chest pain, shortness of breath. EXAM: CHEST - 2 VIEW COMPARISON:  May 18, 2019. FINDINGS: The heart size and mediastinal contours are within normal limits. Both lungs are clear. No pneumothorax or pleural effusion is noted. Sternotomy wires are noted. The visualized skeletal structures are unremarkable. IMPRESSION: No active cardiopulmonary disease. Electronically Signed   By: Marijo Conception M.D.   On: 01/04/2021 10:02     Assessment and Plan:   1. Unstable angina with known CAD -CABG 22 years ago and stent to VG to RCA two occasions.  Occluded VG to diag on cath 2018. Patent RIMA to LAD and LIMA to OM -  Chronic RBBB no acute EKG changes though freq ectopy.  May need cardiac cath to eval.  Will check covid test now. Dr. Burt Knack to see. currently pain free after NTG.  Continue ASA and tenormin, would hold HCTZ for now.  Will hold iv Heparin for now since pain free. 2. CAD with hx CABG 2000, and graft dysfunction since with stents as above .   3. HLD on statin on zocor and zetia. On tenoritc.   4. HTN up on arrival.      Risk Assessment/Risk Scores:     HEAR Score (for undifferentiated chest pain):  HEAR Score: 6       Severity of Illness: The appropriate patient status for this patient is INPATIENT. Inpatient status is judged to be reasonable and necessary in order to provide the required intensity of service to ensure the patient's safety. The patient's presenting symptoms, physical exam findings, and initial radiographic and laboratory data in the context of their chronic comorbidities is felt to place them at high risk for further clinical deterioration. Furthermore, it is not anticipated that the patient will be medically stable for discharge from the hospital within 2 midnights of admission. The following factors support the patient status of inpatient.   " The patient's presenting symptoms include chest pain at rest. " The worrisome physical exam findings include freq PVCs on EKG . " The initial radiographic and laboratory data are worrisome because of  they are stable.. " The chronic co-morbidities include 80 year old CABG, HTN HLD and stents to VG to RCA.   * I certify that at the point of admission it is my clinical judgment that the patient will require inpatient hospital care spanning beyond 2 midnights from the point of admission due to high intensity of service, high risk for further deterioration and high frequency of surveillance required.*    For questions or updates, please contact [place] Please consult [email or web address] for contact info under    Signed, Cecilie Kicks, NP  01/04/2021 1:33 PM   Patient seen, examined. Available data reviewed. Agree with findings, assessment, and plan as outlined by Cecilie Kicks, NP.  The patient is independently interviewed and examined.  He is alert, oriented, in no distress.  JVP is normal, carotid upstrokes are normal without bruits, lungs are clear to auscultation bilaterally, heart is regular rate and rhythm with no  murmur or gallop, abdomen is soft and tender with no masses, skin is warm and dry with no rash, extremities have no edema. High-sensitivity troponin values are negative at 12 and 11.  COVID-19 testing is negative.  Chest x-ray shows no active cardiopulmonary disease.  EKG shows normal sinus rhythm at a rate of 72 bpm, right bundle branch block, and frequent PVCs.  The patient has very typical symptoms of unstable angina as he has now had 3 episodes this week of significant substernal chest pressure.  Today's episode responded to one sublingual nitroglycerin.  This was his most severe episode of the 3.  He has extensive coronary artery disease, prior CABG which was done remotely, and has undergone multiple vein graft PCI procedures last in 2018.  I have discussed his case with his primary cardiologist, Dr. Tamala Julian.  We agree that cardiac catheterization and possible PCI are indicated.  The patient is Plavix allergic.  He has tolerated ticagrelor in the past without problems.  We will start him on ticagrelor 180 mg x 1 now. I have reviewed the risks, indications, and alternatives to cardiac catheterization, possible angioplasty, and stenting with the patient. Risks include but are not limited to bleeding, infection, vascular injury, stroke, myocardial infection, arrhythmia, kidney injury, radiation-related injury in the case of prolonged fluoroscopy use, emergency cardiac surgery, and death. The patient understands the risks of serious complication is 1-2 in 7829 with diagnostic cardiac cath and 1-2% or less with angioplasty/stenting.  Further plans/disposition pending his cardiac catheterization results.  Sherren Mocha, M.D. 01/04/2021 2:43 PM

## 2021-01-04 NOTE — ED Triage Notes (Signed)
Pt c.o chest pain and sob on exertion since Wednesday. Resp e.u at this time.

## 2021-01-04 NOTE — ED Provider Notes (Signed)
Joshua [name]   CSN: [id number] Arrival date & time: 01/04/21  0927     History Chief Complaint  Patient presents with  . Chest Pain  . Shortness of Breath    Joshua T Kealan Dintino. is a 80 y.o. male.  Patient with history of coronary artery disease, CABG, stent placement 2018, follows with Dr. Tamala Julian cardiology presents with more frequent chest discomfort since Wednesday.  Patient's had episodes lasting 30 minutes of tightness and exertional shortness of breath.  No fevers chills or significant cough.  Patient denies blood clot history, recent surgery.     HPI: A 80 year old patient with a history of hypertension, hypercholesterolemia and obesity presents for evaluation of chest pain. Initial onset of pain was approximately 1-3 hours ago. The patient's chest pain is described as heaviness/pressure/tightness, is not worse with exertion and is relieved by nitroglycerin. The patient's chest pain is middle- or left-sided, is not well-localized, is not sharp and does not radiate to the arms/jaw/neck. The patient does not complain of nausea and denies diaphoresis. The patient has no history of stroke, has no history of peripheral artery disease, has not smoked in the past 90 days, denies any history of treated diabetes and has no relevant family history of coronary artery disease (first degree relative at less than age 8).   Past Medical History:  Diagnosis Date  . Coronary artery disease involving native coronary artery of native heart with angina pectoris ([place])    a. 1990 s/p CABG x 4 (RIMA->LAD, LIMA->LCX, VG->Diag, VG->RCA); b. 2011 s/p BMS to VG->RCA.; c. Unstable Angina 05/2017: Patent RIMA-LAD & LIMA-LCx, ostSVG-RCA 65% ISR & mSVG-RCA  99% - DES PCI to both // Myoview 05/2019:  EF 57, no ischemia or scar, Low Risk   . GERD (gastroesophageal reflux disease)   . History of echocardiogram    a. 01/2017 Echo: EF 55-60%, no rwma, mild MR, midlly  dil LA, PASP 90mmHg.  Marland Kitchen History of hiatal hernia   . Hyperlipidemia   . Hypertension   . Obesity     Patient Active Problem List   Diagnosis Date Noted  . Unstable angina ([place]) 01/04/2021  . Diplopia 12/11/2020  . History of non-ST elevation myocardial infarction (NSTEMI) 06/25/2017  . Presence of drug coated stent in SVG-RCA 06/25/2017  . Postherpetic neuralgia 01/02/2017  . Tremor 05/15/2016  . Irritability and anger 05/15/2016  . Seborrheic keratoses 06/16/2014  . Special screening for malignant neoplasm of prostate 06/14/2014  . Retinal hemorrhage 07/23/2010  . Impotence of organic origin 01/16/2010  . Essential hypertension 04/18/2009  . Hyperlipidemia with target low density lipoprotein (LDL) cholesterol less than 70 mg/dL 01/28/2007  . CAD (coronary artery disease) 01/28/2007  . HERNIA, HIATAL, NONCONGENITAL 01/28/2007    Past Surgical History:  Procedure Laterality Date  . CATARACT EXTRACTION W/ INTRAOCULAR LENS  IMPLANT, BILATERAL Bilateral   . CORONARY ANGIOPLASTY WITH STENT PLACEMENT  2011   "RCA"  . CORONARY ARTERY BYPASS GRAFT  1990   CABG X4  . CORONARY STENT INTERVENTION N/A 06/24/2017   Procedure: Coronary Stent Intervention;  Surgeon: Belva Crome, MD;  Location: [facility];  Service: Cardiovascular: Ostial 4.0 x 12 mm Onyx DES reducing 70% stenosis to less than 40%. Distal body 3.5 x 15 Onyx DES reducing 99% stenosis to less than 30%.  Marland Kitchen LEFT HEART CATH AND CORS/GRAFTS ANGIOGRAPHY N/A 06/24/2017   Procedure: Left Heart Cath and Cors/Grafts Angiography;  Surgeon: Belva Crome, MD;  Location: [facility];  Service: Cardiovascular:  CTO native RCA, LAD & LCx (Graft Dependent). Patent LIMA-LCx & RIMA-LAD.  SVG-RCA: ost 65% ISR & mid 99% --> DES PCI to both       Family History  Problem Relation Age of Onset  . Cancer Mother        ovarian  . Hyperlipidemia Son   . Hypertension Son   . Heart attack Father   . Coronary artery disease Father      Social History   Tobacco Use  . Smoking status: Never Smoker  . Smokeless tobacco: Former Neurosurgeon    Types: Engineer, drilling  . Vaping Use: Never used  Substance Use Topics  . Alcohol use: Yes    Alcohol/week: 0.0 standard drinks    Comment: 06/23/2017 "a few drinks/ month  . Drug use: No    Home Medications Prior to Admission medications   Medication Sig Start Date End Date Taking? Authorizing Provider  ALPRAZolam (XANAX) 0.5 MG tablet TAKE 1 TABLET BY MOUTH AT  BEDTIME AS NEEDED FOR  RESTLESS LEGS Patient taking differently: Take 0.5 mg by mouth daily as needed (Restless legs). 10/03/20  Yes Nestor Ramp, MD  aspirin EC 81 MG EC tablet Take 1 tablet (81 mg total) by mouth daily. 06/26/17  Yes Arty Baumgartner, NP  atenolol-chlorthalidone (TENORETIC) 100-25 MG tablet TAKE 1 TABLET BY MOUTH  DAILY Patient taking differently: Take 1 tablet by mouth daily. 07/03/20  Yes Nestor Ramp, MD  citalopram (CELEXA) 20 MG tablet TAKE 1 TABLET BY MOUTH  DAILY Patient taking differently: Take 20 mg by mouth daily. 07/03/20  Yes Nestor Ramp, MD  esomeprazole (NEXIUM) 20 MG capsule Take 1 capsule (20 mg total) by mouth daily. 12/07/18  Yes Nestor Ramp, MD  ezetimibe (ZETIA) 10 MG tablet TAKE 1 TABLET BY MOUTH  DAILY Patient taking differently: Take 10 mg by mouth daily. 07/03/20  Yes Nestor Ramp, MD  gabapentin (NEURONTIN) 300 MG capsule Take 300 mg by mouth 3 (three) times daily. 2 tab in the morning, 1 at noon, and 2 at bedtime   Yes [provider]  isosorbide mononitrate (IMDUR) 30 MG 24 hr tablet TAKE 3 TABLETS BY MOUTH  DAILY Patient taking differently: Take 30 mg by mouth daily. 04/04/20  Yes Nestor Ramp, MD  nitroGLYCERIN (NITROSTAT) 0.4 MG SL tablet Place 1 tablet (0.4 mg total) under the tongue every 5 (five) minutes x 3 doses as needed for chest pain. 12/06/18  Yes Lyn Records, MD  simvastatin (ZOCOR) 40 MG tablet TAKE 1 TABLET BY MOUTH  DAILY Patient taking differently: Take 40  mg by mouth every evening. 11/12/20  Yes Nestor Ramp, MD    Allergies    Ace inhibitors, Atorvastatin, Rosuvastatin, Plavix [clopidogrel bisulfate], Sulfa antibiotics, and Sulfonamide derivatives  Review of Systems   Review of Systems  Constitutional: Negative for chills and fever.  HENT: Negative for congestion.   Eyes: Negative for visual disturbance.  Respiratory: Positive for shortness of breath.   Cardiovascular: Positive for chest pain. Negative for leg swelling.  Gastrointestinal: Negative for abdominal pain and vomiting.  Genitourinary: Negative for dysuria and flank pain.  Musculoskeletal: Negative for back pain, neck pain and neck stiffness.  Skin: Negative for rash.  Neurological: Negative for light-headedness and headaches.    Physical Exam Updated Vital Signs BP (!) 169/74   Pulse (!) 53   Temp 98.9 F (37.2 C) (Oral)  Resp (!) 24   Ht 5\' 8"  (1.727 m)   Wt 101.2 kg   SpO2 96%   BMI 33.91 kg/m   Physical Exam Vitals and nursing [name] reviewed.  Constitutional:      Appearance: He is well-developed and well-nourished.  HENT:     Head: Normocephalic and atraumatic.  Eyes:     General:        Right eye: No discharge.        Left eye: No discharge.     Conjunctiva/sclera: Conjunctivae normal.  Neck:     Trachea: No tracheal deviation.  Cardiovascular:     Rate and Rhythm: Regular rhythm. Bradycardia present.     Heart sounds: Normal heart sounds.  Pulmonary:     Effort: Pulmonary effort is normal.     Breath sounds: Normal breath sounds.  Abdominal:     General: There is no distension.     Palpations: Abdomen is soft.     Tenderness: There is no abdominal tenderness. There is no guarding.  Musculoskeletal:        General: No edema.     Cervical back: Normal range of motion and neck supple.     Right lower leg: No edema.     Left lower leg: No edema.  Skin:    General: Skin is warm.     Findings: No rash.  Neurological:     Mental Status: He is  alert and oriented to person, place, and time.  Psychiatric:        Mood and Affect: Mood and affect normal.     ED Results / Procedures / Treatments   Labs (all labs ordered are listed, but only abnormal results are displayed) Labs Reviewed  BASIC METABOLIC PANEL - Abnormal; Notable for the following components:      Result Value   Glucose, Bld 123 (*)    All other components within normal limits  CBC - Abnormal; Notable for the following components:   Platelets 145 (*)    All other components within normal limits  HEMOGLOBIN A1C - Abnormal; Notable for the following components:   Hgb A1c MFr Bld 6.5 (*)    All other components within normal limits  SARS CORONAVIRUS 2 BY RT PCR (HOSPITAL ORDER, [facility])  CBC  CREATININE, SERUM  MAGNESIUM  TSH  T4, FREE  TROPONIN I (HIGH SENSITIVITY)  TROPONIN I (HIGH SENSITIVITY)    EKG EKG Interpretation  Date/Time:  Friday January 04 2021 09:36:57 EST Ventricular Rate:  72 PR Interval:  194 QRS Duration: 148 QT Interval:  428 QTC Calculation: 468 R Axis:   -42 Text Interpretation: Sinus bradycardia with frequent Premature ventricular complexes Left axis deviation Right bundle branch block Abnormal ECG Confirmed by Joshua [name] [phone]) on 01/04/2021 11:08:43 AM   Radiology DG Chest 2 View  Result Date: 01/04/2021 CLINICAL DATA:  Chest pain, shortness of breath. EXAM: CHEST - 2 VIEW COMPARISON:  May 18, 2019. FINDINGS: The heart size and mediastinal contours are within normal limits. Both lungs are clear. No pneumothorax or pleural effusion is noted. Sternotomy wires are noted. The visualized skeletal structures are unremarkable. IMPRESSION: No active cardiopulmonary disease. Electronically Signed   By: Marijo Conception M.D.   On: 01/04/2021 10:02    Procedures Procedures   Medications Ordered in ED Medications  sodium chloride flush (NS) 0.9 % injection 3 mL (3 mLs Intravenous Given 01/04/21 1457)   sodium chloride flush (NS) 0.9 % injection  3 mL (has no administration in time range)  0.9 %  sodium chloride infusion (has no administration in time range)  aspirin chewable tablet 81 mg (has no administration in time range)  0.9 %  sodium chloride infusion ( Intravenous New Bag/Given 01/04/21 1457)  ezetimibe (ZETIA) tablet 10 mg (has no administration in time range)  isosorbide mononitrate (IMDUR) 24 hr tablet 90 mg (has no administration in time range)  simvastatin (ZOCOR) tablet 40 mg (has no administration in time range)  ALPRAZolam (XANAX) tablet 0.25 mg (has no administration in time range)  citalopram (CELEXA) tablet 20 mg (has no administration in time range)  pantoprazole (PROTONIX) EC tablet 40 mg (has no administration in time range)  gabapentin (NEURONTIN) capsule 300 mg (has no administration in time range)  aspirin EC tablet 81 mg (has no administration in time range)  nitroGLYCERIN (NITROSTAT) SL tablet 0.4 mg (has no administration in time range)  acetaminophen (TYLENOL) tablet 650 mg (has no administration in time range)  ondansetron (ZOFRAN) injection 4 mg (has no administration in time range)  heparin injection 5,000 Units (has no administration in time range)  0.9 %  sodium chloride infusion ( Intravenous Not Given 01/04/21 1534)  zolpidem (AMBIEN) tablet 5 mg (has no administration in time range)  atenolol (TENORMIN) tablet 100 mg (has no administration in time range)    And  chlorthalidone (HYGROTON) tablet 25 mg (has no administration in time range)  aspirin chewable tablet 324 mg (324 mg Oral Given 01/04/21 1314)  ticagrelor (BRILINTA) tablet 180 mg (180 mg Oral Given 01/04/21 1435)    ED Course  I have reviewed the triage vital signs and the nursing notes.  Pertinent labs & imaging results that were available during my care of the patient were reviewed by me and considered in my medical decision making (see chart for details).    MDM Rules/Calculators/A&P HEAR  Score: 6                        Patient with significant cardiac history presents with exertional shortness of breath and chest pain.  Reviewed medical records showing patient's CABG and stent placement.  Initial work-up showed negative troponin, patient is chest pain-free at this time, white blood cell count hemoglobin normal.  Kidney function unremarkable reviewed.  Discussed with cardiology for consult likely observation/admission.  Aspirin ordered.  Cardiology evaluated the patient recommends admission and plan for Cath Lab nonemergent.  Patient stable while in the emergency room with mild elevated blood pressure.  Deklyn T Sterling Mondo. was evaluated in Emergency Department on 01/04/2021 for the symptoms described in the history of present illness. He was evaluated in the context of the global COVID-19 pandemic, which necessitated consideration that the patient might be at risk for infection with the SARS-CoV-2 virus that causes COVID-19. Institutional protocols and algorithms that pertain to the evaluation of patients at risk for COVID-19 are in a state of rapid change based on information released by regulatory bodies including the CDC and federal and state organizations. These policies and algorithms were followed during the patient's care in the ED.   Final Clinical Impression(s) / ED Diagnoses Final diagnoses:  Acute chest pain  Coronary artery disease, unspecified vessel or lesion type, unspecified whether angina present, unspecified whether native or transplanted heart    Rx / DC Orders ED Discharge Orders    None       Joshua Morrison, MD 01/04/21 1546

## 2021-01-04 NOTE — Telephone Encounter (Signed)
Spoke with the patients wife, [name] (DPR on file). She was following up on her call from 2/3 with Anderson Malta, RN (see phone note). Patient has been experiencing chest tightness this morning, similar to how he felt pre-heart cath. Patient has been having chest discomfort on and off this week, worsening with exertion. Patient did take a Nitroglycerin this morning and felt relief within five minutes. Advised patient to go to the ED due to previous cardiac history, continued chest discomfort. Patient's wife agreeable. Will send to Dr. Tamala Julian for further advisement if necessary.

## 2021-01-04 NOTE — Progress Notes (Signed)
CARDIOLOGY OFFICE NOTE  Date:  01/08/2021    Joshua [name]. Date of Birth: Dec 07, 1940 Medical Record #[id number]  PCP:  Dickie La, MD  Cardiologist:  Tamala Julian   Chief Complaint  Patient presents with  . Follow-up  . Hospitalization Follow-up    Seen for Dr. Tamala Julian    History of Present Illness: Joshua [name]. is a 80 y.o. male who presents today for a work in visit. Seen for Dr. Tamala Julian.   He has a history of known CAD with prior remote CABG in 1990 and subsequent PCI to the SVG-RCA with a bare-metal stent in 2011.  He was admitted in July 2018 with non-ST elevation myocardial infarction and underwent PCI with a DES x2 to the SVG-RCA. When seen Feb. 2019 with angina, was noted to have anemia. Other issues with HTN and HLD and obesity.   Last seen in September by Dr. Tamala Julian - no chest pain - mowing his grass.   Phone call last week - having chest pain - appointment made for today - ended up getting admitted and having cath with PCI. Pt had called office with chest tightness especially  when bending to put on shoes. Felt like someone squeezing his chest. Episode just prior to admission lasted 40 min. He has chronic DOE. The tightness reminded him of prior need for cath. No associated symptoms. Presented to the ED on 2/4 with recurrent chest pain. He took sl NTG and he was better in 5 min. Called the office and was sent to the ED.   Underwent cardiac cath as noted below with no disease amenable to PCI per Dr. Tamala Julian. Plan is to continue with medical therapy. His Imdur was increased from 90mg  daily to 120mg  daily. Continue on home medications of ASA, statin, BB without further adjustment. Follow up echo showed normal EF with no rWMA noted.   Comes in today. Here with his wife - Bailey Mech. Says he has not had any more chest pain. Procedure was just a few days ago however.  Not very active as a general rule. Walking some. No NTG use. Now on Imdur 120 mg a day. Not dizzy or  lightheaded. They plan to start a walking program that can lead them to going to [employer]. No problems with his groin.   Past Medical History:  Diagnosis Date  . Coronary artery disease involving native coronary artery of native heart with angina pectoris ([place])    a. 1990 s/p CABG x 4 (RIMA->LAD, LIMA->LCX, VG->Diag, VG->RCA); b. 2011 s/p BMS to VG->RCA.; c. Unstable Angina 05/2017: Patent RIMA-LAD & LIMA-LCx, ostSVG-RCA 65% ISR & mSVG-RCA  99% - DES PCI to both // Myoview 05/2019:  EF 57, no ischemia or scar, Low Risk   . GERD (gastroesophageal reflux disease)   . History of echocardiogram    a. 01/2017 Echo: EF 55-60%, no rwma, mild MR, midlly dil LA, PASP 10mmHg.  Marland Kitchen History of hiatal hernia   . Hyperlipidemia   . Hypertension   . Obesity     Past Surgical History:  Procedure Laterality Date  . CATARACT EXTRACTION W/ INTRAOCULAR LENS  IMPLANT, BILATERAL Bilateral   . CORONARY ANGIOPLASTY WITH STENT PLACEMENT  2011   "RCA"  . CORONARY ARTERY BYPASS GRAFT  1990   CABG X4  . CORONARY STENT INTERVENTION N/A 06/24/2017   Procedure: Coronary Stent Intervention;  Surgeon: Belva Crome, MD;  Location: [facility];  Service: Cardiovascular: Ostial 4.0 x  12 mm Onyx DES reducing 70% stenosis to less than 40%. Distal body 3.5 x 15 Onyx DES reducing 99% stenosis to less than 30%.  Marland Kitchen LEFT HEART CATH AND CORS/GRAFTS ANGIOGRAPHY N/A 06/24/2017   Procedure: Left Heart Cath and Cors/Grafts Angiography;  Surgeon: Belva Crome, MD;  Location: [facility];  Service: Cardiovascular:  CTO native RCA, LAD & LCx (Graft Dependent). Patent LIMA-LCx & RIMA-LAD.  SVG-RCA: ost 65% ISR & mid 99% --> DES PCI to both  . LEFT HEART CATH AND CORS/GRAFTS ANGIOGRAPHY N/A 01/04/2021   Procedure: LEFT HEART CATH AND CORS/GRAFTS ANGIOGRAPHY;  Surgeon: Belva Crome, MD;  Location: [facility];  Service: Cardiovascular;  Laterality: N/A;     Medications: Current Meds  Medication Sig  .  ALPRAZolam (XANAX) 0.5 MG tablet TAKE 1 TABLET BY MOUTH AT  BEDTIME AS NEEDED FOR  RESTLESS LEGS  . aspirin EC 81 MG EC tablet Take 1 tablet (81 mg total) by mouth daily.  Marland Kitchen atenolol-chlorthalidone (TENORETIC) 100-25 MG tablet TAKE 1 TABLET BY MOUTH  DAILY  . citalopram (CELEXA) 20 MG tablet TAKE 1 TABLET BY MOUTH  DAILY (Patient taking differently: TAKE 1 TABLET BY MOUTH  DAILY)  . esomeprazole (NEXIUM) 20 MG capsule Take 1 capsule (20 mg total) by mouth daily.  Marland Kitchen ezetimibe (ZETIA) 10 MG tablet TAKE 1 TABLET BY MOUTH  DAILY  . gabapentin (NEURONTIN) 300 MG capsule Take 300 mg by mouth 3 (three) times daily. 2 tab in the morning, 1 at noon, and 2 at bedtime  . isosorbide mononitrate (IMDUR) 120 MG 24 hr tablet Take 1 tablet (120 mg total) by mouth daily.  . simvastatin (ZOCOR) 40 MG tablet TAKE 1 TABLET BY MOUTH  DAILY  . [DISCONTINUED] nitroGLYCERIN (NITROSTAT) 0.4 MG SL tablet Place 1 tablet (0.4 mg total) under the tongue every 5 (five) minutes x 3 doses as needed for chest pain.     Allergies: Allergies  Allergen Reactions  . Ace Inhibitors Cough  . Atorvastatin Other (See Comments)    Muscle pain  . Rosuvastatin Other (See Comments)    Muscle pain  . Plavix [Clopidogrel Bisulfate] Itching and Rash  . Sulfa Antibiotics Itching and Rash  . Sulfonamide Derivatives Itching and Rash    Social History: The patient  reports that he has never smoked. He quit smokeless tobacco use about 22 years ago.  His smokeless tobacco use included chew. He reports current alcohol use. He reports that he does not use drugs.   Family History: The patient's family history includes Cancer in his mother; Coronary artery disease in his father; Heart attack in his father; Hyperlipidemia in his son; Hypertension in his son.   Review of Systems: Please see the history of present illness.   All other systems are reviewed and negative.   Physical Exam: VS:  BP 140/60   Pulse (!) 53   Ht 5\' 8"  (1.727 m)    Wt 224 lb (101.6 kg)   SpO2 97%   BMI 34.06 kg/m  .  BMI Body mass index is 34.06 kg/m.  Wt Readings from Last 3 Encounters:  01/08/21 224 lb (101.6 kg)  01/05/21 222 lb 6.4 oz (100.9 kg)  12/10/20 231 lb (104.8 kg)    General: Pleasant. Alert and in no acute distress. He is obese.  Cardiac: Regular rate and rhythm. Soft outflow murmur. No edema.  Respiratory:  Lungs are clear to auscultation bilaterally with normal work of breathing.  GI: Soft  and nontender.  MS: No deformity or atrophy. Gait and ROM intact.  Skin: Warm and dry. Color is normal.  Neuro:  Strength and sensation are intact and no gross focal deficits noted.  Psych: Alert, appropriate and with normal affect.   LABORATORY DATA:  EKG:  EKG is ordered today.  Personally reviewed by me. This demonstrates sinus brady - 1st degree AV block - inferior Q's and RSR'.   Lab Results  Component Value Date   WBC 6.4 01/05/2021   HGB 13.6 01/05/2021   HCT 41.1 01/05/2021   PLT 141 (L) 01/05/2021   GLUCOSE 103 (H) 01/05/2021   CHOL 151 01/05/2021   TRIG 294 (H) 01/05/2021   HDL 25 (L) 01/05/2021   LDLDIRECT 87 05/30/2020   LDLCALC 67 01/05/2021   ALT 25 05/30/2020   AST 37 05/30/2020   NA 134 (L) 01/05/2021   K 3.8 01/05/2021   CL 98 01/05/2021   CREATININE 0.96 01/05/2021   BUN 14 01/05/2021   CO2 26 01/05/2021   TSH 3.057 01/04/2021   PSA 1.20 05/14/2016   INR 1.1 12/23/2017   HGBA1C 6.5 (H) 01/04/2021     BNP (last 3 results) No results for input(s): BNP in the last 8760 hours.  ProBNP (last 3 results) Recent Labs    08/01/20 0916  PROBNP 106     Other Studies Reviewed Today:  LEFT HEART CATH AND CORS/GRAFTS ANGIOGRAPHY 01/2021   Conclusion   Ostial occlusion of the left main and native right coronary.  Occlusion of saphenous vein graft to the diagonal.  Patent right internal mammary artery graft to the mid LAD.  Patent left internal mammary artery graft to the circumflex  marginal.  Patent saphenous vein graft to the distal RCA which could not be selectively engaged due to overhanging ostial stents.  The distal two thirds of the vessel appeared to be widely patent.  There could be high-grade obstruction at the distal anastomosis or in the native RCA.  Overall normal LV function with normal LVEDP.  EF greater than 50%.  RECOMMENDATIONS:   Patient with advanced atherosclerosis, failed saphenous vein grafts including inability to selectively engaged the saphenous vein graft to the right coronary due to overhanging ostial stent placed in 2018.  Given the current findings, he is really not a candidate for repeated angiography as intervention is not possible unless there is clear evidence of anterior MI in which case perhaps right radial approach could be used to attempt selective engagement of the [place] and perform salvage PCI on the LAD.  Same is true if there is concern for circumflex territory MI, perhaps the LIMA could be used as a conduit to treat native circumflex disease.   ECHO IMPRESSIONS 01/2021  1. Left ventricular ejection fraction, by estimation, is 55 to 60%. The  left ventricle has normal function. The left ventricle has no regional  wall motion abnormalities. There is moderate left ventricular hypertrophy.  Left ventricular diastolic  parameters were normal.  2. Right ventricular systolic function is mildly reduced. The right  ventricular size is normal. There is mildly elevated pulmonary artery  systolic pressure. The estimated right ventricular systolic pressure is  16.1 mmHg.  3. Right atrial size was mildly dilated.  4. The mitral valve is normal in structure. Trivial mitral valve  regurgitation. No evidence of mitral stenosis.  5. The aortic valve is abnormal. There is moderate calcification of the  aortic valve. Aortic valve regurgitation is not visualized. Mild aortic  valve sclerosis  is present, with no evidence of aortic valve  stenosis.  6. The inferior vena cava is dilated in size with >50% respiratory  variability, suggesting right atrial pressure of 8 mmHg.    Cath: 01/04/21   Ostial occlusion of the left main and native right coronary.  Occlusion of saphenous vein graft to the diagonal.  Patent right internal mammary artery graft to the mid LAD.  Patent left internal mammary artery graft to the circumflex marginal.  Patent saphenous vein graft to the distal RCA which could not be selectively engaged due to overhanging ostial stents. The distal two thirds of the vessel appeared to be widely patent. There could be high-grade obstruction at the distal anastomosis or in the native RCA.  Overall normal LV function with normal LVEDP. EF greater than 50%.  RECOMMENDATIONS:   Patient with advanced atherosclerosis, failed saphenous vein grafts including inability to selectively engaged the saphenous vein graft to the right coronary due to overhanging ostial stent placed in 2018. Given the current findings, he is really not a candidate for repeated angiography as intervention is not possible unless there is clear evidence of anterior MI in which case perhaps right radial approach could be used to attempt selective engagement of the [place] and perform salvage PCI on the LAD. Same is true if there is concern for circumflex territory MI, perhaps the LIMA could be used as a conduit to treat native circumflex disease.    ASSESSMENT & PLAN:     1. CAD - remote CABG and prior PCI - now s/p recent admission with cath - failed SVG's - inability to engage the SVG to the RCA due to overhanging ostial stent from 2018 - not a candidate for further interventions - medically managed - his Imdur has been increased - so far, he is doing ok. I have refilled the NTG. They plan to start walking daily.   2. HTN - BP fair - would follow.   3. HLD - on statin and Zetia  4. [place] DM - needs weight loss.   5. Obesity -  they plan to start walking and hopefully progress enough to go to [employer]. Encouragement given.   Current medicines are reviewed with the patient today.  The patient does not have concerns regarding medicines other than what has been noted above.  The following changes have been made:  See above.  Labs/ tests ordered today include:    Orders Placed This Encounter  Procedures  . EKG 12-Lead     Disposition:   FU with Dr. Tamala Julian in about 3 months. Sl NTG refilled today. Aggressive CV risk factor modification needed.    Patient is agreeable to this plan and will call if any problems develop in the interim.   SignedTruitt Merle, NP  01/08/2021 9:58 AM  [facility] [facility] [address] [place], [place]  [address] Phone: [phone] Fax: [phone]

## 2021-01-05 ENCOUNTER — Inpatient Hospital Stay (HOSPITAL_COMMUNITY): Payer: Medicare Other

## 2021-01-05 ENCOUNTER — Other Ambulatory Visit (HOSPITAL_COMMUNITY): Payer: Medicare Other

## 2021-01-05 DIAGNOSIS — I25119 Atherosclerotic heart disease of native coronary artery with unspecified angina pectoris: Secondary | ICD-10-CM

## 2021-01-05 DIAGNOSIS — R079 Chest pain, unspecified: Secondary | ICD-10-CM

## 2021-01-05 DIAGNOSIS — I10 Essential (primary) hypertension: Secondary | ICD-10-CM

## 2021-01-05 LAB — LIPID PANEL
Cholesterol: 151 mg/dL (ref 0–200)
HDL: 25 mg/dL — ABNORMAL LOW (ref >40)
LDL Cholesterol: 67 mg/dL (ref 0–99)
Total CHOL/HDL Ratio: 6 RATIO
Triglycerides: 294 mg/dL — ABNORMAL HIGH (ref <150)
VLDL: 59 mg/dL — ABNORMAL HIGH (ref 0–40)

## 2021-01-05 LAB — ECHOCARDIOGRAM COMPLETE
AR max vel: 2.04 cm2
AV Area VTI: 1.94 cm2
AV Area mean vel: 1.74 cm2
AV Mean grad: 6 mmHg
AV Peak grad: 10.5 mmHg
Ao pk vel: 1.62 m/s
Area-P 1/2: 3.06 cm2
Height: 68 in
MV VTI: 1.5 cm2
S' Lateral: 3 cm
Weight: 3558.4 oz

## 2021-01-05 LAB — CBC
HCT: 41.1 % (ref 39.0–52.0)
Hemoglobin: 13.6 g/dL (ref 13.0–17.0)
MCH: 28.8 pg (ref 26.0–34.0)
MCHC: 33.1 g/dL (ref 30.0–36.0)
MCV: 87.1 fL (ref 80.0–100.0)
Platelets: 141 10*3/uL — ABNORMAL LOW (ref 150–400)
RBC: 4.72 MIL/uL (ref 4.22–5.81)
RDW: 13.1 % (ref 11.5–15.5)
WBC: 6.4 10*3/uL (ref 4.0–10.5)
nRBC: 0 % (ref 0.0–0.2)

## 2021-01-05 LAB — BASIC METABOLIC PANEL
Anion gap: 10 (ref 5–15)
BUN: 14 mg/dL (ref 8–23)
CO2: 26 mmol/L (ref 22–32)
Calcium: 9.3 mg/dL (ref 8.9–10.3)
Chloride: 98 mmol/L (ref 98–111)
Creatinine, Ser: 0.96 mg/dL (ref 0.61–1.24)
GFR, Estimated: >60 mL/min (ref >60)
Glucose, Bld: 103 mg/dL — ABNORMAL HIGH (ref 70–99)
Potassium: 3.8 mmol/L (ref 3.5–5.1)
Sodium: 134 mmol/L — ABNORMAL LOW (ref 135–145)

## 2021-01-05 MED ORDER — PERFLUTREN LIPID MICROSPHERE
1.0000 mL | INTRAVENOUS | Status: AC | PRN
  Administered 2021-01-05: 4 mL via INTRAVENOUS
  Filled 2021-01-05: qty 10

## 2021-01-05 MED ORDER — ISOSORBIDE MONONITRATE ER 60 MG PO TB24
120.0000 mg | ORAL_TABLET | Freq: Every day | ORAL | Status: DC
  Administered 2021-01-06: 120 mg via ORAL
  Filled 2021-01-05: qty 2

## 2021-01-05 NOTE — Progress Notes (Signed)
Progress Note  Patient Name: Joshua [name]. Date of Encounter: 01/05/2021  Primary Cardiologist: Sinclair Grooms, MD   Subjective   No acute events overnight.  Heart catheterization yesterday shows significant coronary artery disease but none amenable to new stenting.  Plan to optimize antianginal therapies.  After the catheterization he has not been up and moving yet.  He has had his echo but it has not been read yet.  Inpatient Medications    Scheduled Meds: . aspirin EC  81 mg Oral Daily  . atenolol  100 mg Oral Daily   And  . chlorthalidone  25 mg Oral Daily  . citalopram  20 mg Oral Daily  . ezetimibe  10 mg Oral Daily  . gabapentin  300 mg Oral TID  . heparin  5,000 Units Subcutaneous Q8H  . isosorbide mononitrate  90 mg Oral Daily  . pantoprazole  40 mg Oral Daily  . simvastatin  40 mg Oral Daily  . sodium chloride flush  3 mL Intravenous Q12H   Continuous Infusions: . sodium chloride    . sodium chloride     PRN Meds: sodium chloride, acetaminophen, ALPRAZolam, nitroGLYCERIN, ondansetron (ZOFRAN) IV, perflutren lipid microspheres (DEFINITY) IV suspension, sodium chloride flush, zolpidem   Vital Signs    Vitals:   01/04/21 1823 01/04/21 2031 01/05/21 0032 01/05/21 0432  BP: (!) 161/70 128/61 137/66 (!) 151/84  Pulse: (!) 56 60 (!) 58 61  Resp: 19 18 17 16   Temp: 97.6 F (36.4 C) 97.7 F (36.5 C)  97.9 F (36.6 C)  TempSrc: Oral Oral  Oral  SpO2: 96% 96% 96% 98%  Weight:    100.9 kg  Height:        Intake/Output Summary (Last 24 hours) at 01/05/2021 1027 Last data filed at 01/05/2021 0500 Gross per 24 hour  Intake 1185.14 ml  Output 1950 ml  Net -764.86 ml   Filed Weights   01/04/21 0938 01/05/21 0432  Weight: 101.2 kg 100.9 kg    Telemetry    Sinus rhythm with rare PVCs.- Personally Reviewed  ECG    No new- Personally Reviewed  Physical Exam   GEN: No acute distress.   Neck: No JVD Cardiac: RRR, no murmurs, rubs, or gallops.   Respiratory: Clear to auscultation bilaterally. GI: Soft, nontender, non-distended  MS: No edema; No deformity. Neuro:  Nonfocal  Psych: Normal affect   Labs    Chemistry Recent Labs  Lab 01/04/21 0953 01/04/21 1448 01/05/21 0214  NA 137  --  134*  K 4.0  --  3.8  CL 102  --  98  CO2 24  --  26  GLUCOSE 123*  --  103*  BUN 16  --  14  CREATININE 0.99 0.93 0.96  CALCIUM 9.3  --  9.3  GFRNONAA >60 >60 >60  ANIONGAP 11  --  10     Hematology Recent Labs  Lab 01/04/21 1448 01/04/21 2036 01/05/21 0214  WBC 6.1 6.5 6.4  RBC 4.73 4.72 4.72  HGB 14.3 13.5 13.6  HCT 41.3 40.9 41.1  MCV 87.3 86.7 87.1  MCH 30.2 28.6 28.8  MCHC 34.6 33.0 33.1  RDW 13.2 13.0 13.1  PLT 165 154 141*    Cardiac EnzymesNo results for input(s): TROPONINI in the last 168 hours. No results for input(s): TROPIPOC in the last 168 hours.   BNPNo results for input(s): BNP, PROBNP in the last 168 hours.   DDimer No results for input(s):  DDIMER in the last 168 hours.   Radiology    DG Chest 2 View  Result Date: 01/04/2021 CLINICAL DATA:  Chest pain, shortness of breath. EXAM: CHEST - 2 VIEW COMPARISON:  May 18, 2019. FINDINGS: The heart size and mediastinal contours are within normal limits. Both lungs are clear. No pneumothorax or pleural effusion is noted. Sternotomy wires are noted. The visualized skeletal structures are unremarkable. IMPRESSION: No active cardiopulmonary disease. Electronically Signed   By: Marijo Conception M.D.   On: 01/04/2021 10:02   CARDIAC CATHETERIZATION  Result Date: 01/04/2021  Ostial occlusion of the left main and native right coronary.  Occlusion of saphenous vein graft to the diagonal.  Patent right internal mammary artery graft to the mid LAD.  Patent left internal mammary artery graft to the circumflex marginal.  Patent saphenous vein graft to the distal RCA which could not be selectively engaged due to overhanging ostial stents.  The distal two thirds of the  vessel appeared to be widely patent.  There could be high-grade obstruction at the distal anastomosis or in the native RCA.  Overall normal LV function with normal LVEDP.  EF greater than 50%. RECOMMENDATIONS:  Patient with advanced atherosclerosis, failed saphenous vein grafts including inability to selectively engaged the saphenous vein graft to the right coronary due to overhanging ostial stent placed in 2018.  Given the current findings, he is really not a candidate for repeated angiography as intervention is not possible unless there is clear evidence of anterior MI in which case perhaps right radial approach could be used to attempt selective engagement of the [place] and perform salvage PCI on the LAD.  Same is true if there is concern for circumflex territory MI, perhaps the LIMA could be used as a conduit to treat native circumflex disease.   Cardiac Studies   January 04, 2021 left heart catheterization personally reviewed  Ostial occlusion of the left main and native right coronary.  Occlusion of saphenous vein graft to the diagonal.  Patent right internal mammary artery graft to the mid LAD.  Patent left internal mammary artery graft to the circumflex marginal.  Patent saphenous vein graft to the distal RCA which could not be selectively engaged due to overhanging ostial stents.  The distal two thirds of the vessel appeared to be widely patent.  There could be high-grade obstruction at the distal anastomosis or in the native RCA.  Overall normal LV function with normal LVEDP.  EF greater than 50%.  RECOMMENDATIONS:   Patient with advanced atherosclerosis, failed saphenous vein grafts including inability to selectively engaged the saphenous vein graft to the right coronary due to overhanging ostial stent placed in 2018.  Given the current findings, he is really not a candidate for repeated angiography as intervention is not possible unless there is clear evidence of anterior MI in  which case perhaps right radial approach could be used to attempt selective engagement of the [place] and perform salvage PCI on the LAD.  Same is true if there is concern for circumflex territory MI, perhaps the LIMA could be used as a conduit to treat native circumflex disease.     Assessment & Plan    1.  Severe coronary artery disease Post CABG 22 years ago with subsequent stents No disease amenable to new PCI on cath on January 04, 2021 Continue antianginals.  Uptitrate Imdur to 120 mg daily. Continue aspirin 81 mg daily and simvastatin 40 mg daily Continue atenolol 100 mg daily I have  asked him to be up and moving during the day today to evaluate his chest pain.  If improved, plan to discharge tomorrow.   2.  Hyperlipidemia Continue statin  3.Hypertension Well controlled.  Monitor with uptitrated dose of Imdur.  Tentatively planning for discharge on Sunday.     For questions or updates, please contact [facility] Please consult [email or web address] for contact info under Cardiology/STEMI.      Signed, Lars Mage, MD  01/05/2021, 10:27 AM

## 2021-01-05 NOTE — Progress Notes (Signed)
CARDIAC REHAB PHASE I   PRE:  Rate/Rhythm:   BP:  Supine:   Sitting:   Standing:    SaO2:   MODE:  Ambulation: 400 ft   POST:  Rate/Rhythm:   BP:  Supine:   Sitting:   Standing:    SaO2:  Patient and wife just returned from walking approximately 400 ft in the hallway without difficulty or chest pain.  Patient has a long history of CAD.  Reviewed importance of exercise, a heart healthy diet, and attending phase II cardiac rehab.  Patient would like to attend outpatient cardiac rehab, referral made to [facility].  Given exercise guidelines for home. [phone] Liliane Channel RN, BSN 01/05/2021 2:02 PM

## 2021-01-05 NOTE — Progress Notes (Signed)
  Echocardiogram 2D Echocardiogram has been performed with [facility].  Joshua [name] 01/05/2021, 9:17 AM

## 2021-01-05 NOTE — Plan of Care (Signed)
  Problem: Education: Goal: Understanding of CV disease, CV risk reduction, and recovery process will improve Outcome: Progressing   Problem: Cardiovascular: Goal: Vascular access site(s) Level 0-1 will be maintained Outcome: Progressing   Problem: Health Behavior/Discharge Planning: Goal: Ability to safely manage health-related needs after discharge will improve Outcome: Progressing

## 2021-01-06 ENCOUNTER — Encounter (HOSPITAL_COMMUNITY): Payer: Self-pay | Admitting: Cardiovascular Disease

## 2021-01-06 MED ORDER — ISOSORBIDE MONONITRATE ER 120 MG PO TB24: 120.0000 mg | ORAL_TABLET | Freq: Every day | ORAL | 1 refills | Status: DC

## 2021-01-06 NOTE — Progress Notes (Signed)
Progress Note  Patient Name: Joshua [name]. Date of Encounter: 01/06/2021  Primary Cardiologist: Sinclair Grooms, MD   Subjective   No acute events overnight.  Echo yesterday shows normal LV EF.  Inpatient Medications    Scheduled Meds: . aspirin EC  81 mg Oral Daily  . atenolol  100 mg Oral Daily   And  . chlorthalidone  25 mg Oral Daily  . citalopram  20 mg Oral Daily  . ezetimibe  10 mg Oral Daily  . gabapentin  300 mg Oral TID  . heparin  5,000 Units Subcutaneous Q8H  . isosorbide mononitrate  120 mg Oral Daily  . pantoprazole  40 mg Oral Daily  . simvastatin  40 mg Oral Daily  . sodium chloride flush  3 mL Intravenous Q12H   Continuous Infusions: . sodium chloride    . sodium chloride     PRN Meds: sodium chloride, acetaminophen, ALPRAZolam, nitroGLYCERIN, ondansetron (ZOFRAN) IV, sodium chloride flush, zolpidem   Vital Signs    Vitals:   01/05/21 0432 01/05/21 2028 01/06/21 0103 01/06/21 0830  BP: (!) 151/84 (!) 127/57 133/78   Pulse: 61 64 66 (!) 57  Resp: 16 17 16    Temp: 97.9 F (36.6 C) 98.5 F (36.9 C) 98.3 F (36.8 C)   TempSrc: Oral Oral Oral   SpO2: 98% 95% 98% 92%  Weight: 100.9 kg     Height:        Intake/Output Summary (Last 24 hours) at 01/06/2021 0956 Last data filed at 01/06/2021 [id number] Gross per 24 hour  Intake 723 ml  Output 850 ml  Net -127 ml   Filed Weights   01/04/21 0938 01/05/21 0432  Weight: 101.2 kg 100.9 kg    Telemetry    Sinus rhythm.- Personally Reviewed  ECG    No new- Personally Reviewed  Physical Exam   GEN: No acute distress.   Neck: No JVD Cardiac: RRR, no murmurs, rubs, or gallops.  Respiratory: Clear to auscultation bilaterally. GI: Soft, nontender, non-distended  MS: No edema; No deformity. Neuro:  Nonfocal  Psych: Normal affect   Labs    Chemistry Recent Labs  Lab 01/04/21 0953 01/04/21 1448 01/05/21 0214  NA 137  --  134*  K 4.0  --  3.8  CL 102  --  98  CO2 24  --  26   GLUCOSE 123*  --  103*  BUN 16  --  14  CREATININE 0.99 0.93 0.96  CALCIUM 9.3  --  9.3  GFRNONAA >60 >60 >60  ANIONGAP 11  --  10     Hematology Recent Labs  Lab 01/04/21 1448 01/04/21 2036 01/05/21 0214  WBC 6.1 6.5 6.4  RBC 4.73 4.72 4.72  HGB 14.3 13.5 13.6  HCT 41.3 40.9 41.1  MCV 87.3 86.7 87.1  MCH 30.2 28.6 28.8  MCHC 34.6 33.0 33.1  RDW 13.2 13.0 13.1  PLT 165 154 141*    Cardiac EnzymesNo results for input(s): TROPONINI in the last 168 hours. No results for input(s): TROPIPOC in the last 168 hours.   BNPNo results for input(s): BNP, PROBNP in the last 168 hours.   DDimer No results for input(s): DDIMER in the last 168 hours.   Radiology    CARDIAC CATHETERIZATION  Result Date: 01/04/2021  Ostial occlusion of the left main and native right coronary.  Occlusion of saphenous vein graft to the diagonal.  Patent right internal mammary artery graft to the mid LAD.  Patent left internal mammary artery graft to the circumflex marginal.  Patent saphenous vein graft to the distal RCA which could not be selectively engaged due to overhanging ostial stents.  The distal two thirds of the vessel appeared to be widely patent.  There could be high-grade obstruction at the distal anastomosis or in the native RCA.  Overall normal LV function with normal LVEDP.  EF greater than 50%. RECOMMENDATIONS:  Patient with advanced atherosclerosis, failed saphenous vein grafts including inability to selectively engaged the saphenous vein graft to the right coronary due to overhanging ostial stent placed in 2018.  Given the current findings, he is really not a candidate for repeated angiography as intervention is not possible unless there is clear evidence of anterior MI in which case perhaps right radial approach could be used to attempt selective engagement of the [place] and perform salvage PCI on the LAD.  Same is true if there is concern for circumflex territory MI, perhaps the LIMA could be  used as a conduit to treat native circumflex disease.  ECHOCARDIOGRAM COMPLETE  Result Date: 01/05/2021    ECHOCARDIOGRAM REPORT   Patient Name:   Joshua [name]. Date of Exam: 01/05/2021 Medical Rec #:  [id number]            Height:       68.0 in Accession #:    [id number]           Weight:       222.4 lb Date of Birth:  [birth year]            BSA:          2.138 m Patient Age:    80 years             BP:           151/84 mmHg Patient Gender: M                    HR:           61 bpm. Exam Location:  Inpatient Procedure: 2D Echo, Cardiac Doppler, Color Doppler and Intracardiac            Opacification Agent Indications:    Chest pain  History:        Patient has prior history of Echocardiogram examinations, most                 recent 02/26/2017. CAD, Angina and Previous Myocardial                 Infarction, Signs/Symptoms:Chest Pain; Risk Factors:Dyslipidemia                 and Hypertension.  Sonographer:    Prange Lefort RDCS (AE) Referring Phys: [place]  Sonographer Comments: Technically difficult study due to poor echo windows. IMPRESSIONS  1. Left ventricular ejection fraction, by estimation, is 55 to 60%. The left ventricle has normal function. The left ventricle has no regional wall motion abnormalities. There is moderate left ventricular hypertrophy. Left ventricular diastolic parameters were normal.  2. Right ventricular systolic function is mildly reduced. The right ventricular size is normal. There is mildly elevated pulmonary artery systolic pressure. The estimated right ventricular systolic pressure is 78.2 mmHg.  3. Right atrial size was mildly dilated.  4. The mitral valve is normal in structure. Trivial mitral valve regurgitation. No evidence of mitral stenosis.  5. The aortic valve is abnormal. There is moderate calcification of the aortic  valve. Aortic valve regurgitation is not visualized. Mild aortic valve sclerosis is present, with no evidence of aortic valve stenosis.  6. The  inferior vena cava is dilated in size with >50% respiratory variability, suggesting right atrial pressure of 8 mmHg. FINDINGS  Left Ventricle: Left ventricular ejection fraction, by estimation, is 55 to 60%. The left ventricle has normal function. The left ventricle has no regional wall motion abnormalities. Definity contrast agent was given IV to delineate the left ventricular  endocardial borders. The left ventricular internal cavity size was normal in size. There is moderate left ventricular hypertrophy. Left ventricular diastolic parameters were normal. Right Ventricle: The right ventricular size is normal. No increase in right ventricular wall thickness. Right ventricular systolic function is mildly reduced. There is mildly elevated pulmonary artery systolic pressure. The tricuspid regurgitant velocity  is 2.85 m/s, and with an assumed right atrial pressure of 8 mmHg, the estimated right ventricular systolic pressure is 40.5 mmHg. Left Atrium: Left atrial size was normal in size. Right Atrium: Right atrial size was mildly dilated. Pericardium: There is no evidence of pericardial effusion. Mitral Valve: The mitral valve is normal in structure. Trivial mitral valve regurgitation. No evidence of mitral valve stenosis. MV peak gradient, 4.2 mmHg. The mean mitral valve gradient is 2.0 mmHg. Tricuspid Valve: The tricuspid valve is normal in structure. Tricuspid valve regurgitation is mild . No evidence of tricuspid stenosis. Aortic Valve: The aortic valve is abnormal. There is moderate calcification of the aortic valve. Aortic valve regurgitation is not visualized. Mild aortic valve sclerosis is present, with no evidence of aortic valve stenosis. Aortic valve mean gradient measures 6.0 mmHg. Aortic valve peak gradient measures 10.5 mmHg. Aortic valve area, by VTI measures 1.94 cm. Pulmonic Valve: The pulmonic valve was not well visualized. Pulmonic valve regurgitation is trivial. No evidence of pulmonic stenosis.  Aorta: The aortic root is normal in size and structure. Venous: The inferior vena cava is dilated in size with greater than 50% respiratory variability, suggesting right atrial pressure of 8 mmHg. IAS/Shunts: No atrial level shunt detected by color flow Doppler.  LEFT VENTRICLE PLAX 2D LVIDd:         4.30 cm  Diastology LVIDs:         3.00 cm  LV e' medial:    7.71 cm/s LV PW:         1.50 cm  LV E/e' medial:  10.3 LV IVS:        1.50 cm  LV e' lateral:   8.06 cm/s LVOT diam:     2.00 cm  LV E/e' lateral: 9.9 LV SV:         71 LV SV Index:   33 LVOT Area:     3.14 cm  RIGHT VENTRICLE            IVC RV Basal diam:  4.50 cm    IVC diam: 2.10 cm RV Mid diam:    2.90 cm RV S prime:     9.37 cm/s TAPSE (M-mode): 1.3 cm LEFT ATRIUM             Index       RIGHT ATRIUM           Index LA diam:        4.60 cm 2.15 cm/m  RA Area:     27.10 cm LA Vol (A2C):   58.9 ml 27.55 ml/m RA Volume:   91.20 ml  42.66 ml/m LA Vol (A4C):  44.3 ml 20.72 ml/m LA Biplane Vol: 51.2 ml 23.95 ml/m  AORTIC VALVE AV Area (Vmax):    2.04 cm AV Area (Vmean):   1.74 cm AV Area (VTI):     1.94 cm AV Vmax:           162.00 cm/s AV Vmean:          118.000 cm/s AV VTI:            0.365 m AV Peak Grad:      10.5 mmHg AV Mean Grad:      6.0 mmHg LVOT Vmax:         105.00 cm/s LVOT Vmean:        65.400 cm/s LVOT VTI:          0.225 m LVOT/AV VTI ratio: 0.62  AORTA Ao Root diam: 3.40 cm Ao Asc diam:  3.20 cm MITRAL VALVE               TRICUSPID VALVE MV Area (PHT): 3.06 cm    TR Peak grad:   32.5 mmHg MV Area VTI:   1.50 cm    TR Vmax:        285.00 cm/s MV Peak grad:  4.2 mmHg MV Mean grad:  2.0 mmHg    SHUNTS MV Vmax:       1.02 m/s    Systemic VTI:  0.22 m MV Vmean:      56.0 cm/s   Systemic Diam: 2.00 cm MV Decel Time: 248 msec MV E velocity: 79.70 cm/s MV A velocity: 84.80 cm/s MV E/A ratio:  0.94 Cherlynn Kaiser MD Electronically signed by Cherlynn Kaiser MD Signature Date/Time: 01/05/2021/1:18:27 PM    Final     Cardiac Studies    January 04, 2021 left heart catheterization personally reviewed  Ostial occlusion of the left main and native right coronary.  Occlusion of saphenous vein graft to the diagonal.  Patent right internal mammary artery graft to the mid LAD.  Patent left internal mammary artery graft to the circumflex marginal.  Patent saphenous vein graft to the distal RCA which could not be selectively engaged due to overhanging ostial stents.  The distal two thirds of the vessel appeared to be widely patent.  There could be high-grade obstruction at the distal anastomosis or in the native RCA.  Overall normal LV function with normal LVEDP.  EF greater than 50%.  RECOMMENDATIONS:   Patient with advanced atherosclerosis, failed saphenous vein grafts including inability to selectively engaged the saphenous vein graft to the right coronary due to overhanging ostial stent placed in 2018.  Given the current findings, he is really not a candidate for repeated angiography as intervention is not possible unless there is clear evidence of anterior MI in which case perhaps right radial approach could be used to attempt selective engagement of the [place] and perform salvage PCI on the LAD.  Same is true if there is concern for circumflex territory MI, perhaps the LIMA could be used as a conduit to treat native circumflex disease.  01/05/2021 Echo 1. Left ventricular ejection fraction, by estimation, is 55 to 60%. The  left ventricle has normal function. The left ventricle has no regional  wall motion abnormalities. There is moderate left ventricular hypertrophy.  Left ventricular diastolic  parameters were normal.  2. Right ventricular systolic function is mildly reduced. The right  ventricular size is normal. There is mildly elevated pulmonary artery  systolic pressure. The estimated right ventricular systolic pressure is  40.5 mmHg.  3. Right atrial size was mildly dilated.  4. The mitral valve is normal in  structure. Trivial mitral valve  regurgitation. No evidence of mitral stenosis.  5. The aortic valve is abnormal. There is moderate calcification of the  aortic valve. Aortic valve regurgitation is not visualized. Mild aortic  valve sclerosis is present, with no evidence of aortic valve stenosis.  6. The inferior vena cava is dilated in size with >50% respiratory  variability, suggesting right atrial pressure of 8 mmHg.    Assessment & Plan    1.  Severe coronary artery disease Post CABG 22 years ago with subsequent stents No disease amenable to new PCI on cath on January 04, 2021 Continue antianginals.  Imdur to 120 mg daily. Continue aspirin 81 mg daily and simvastatin 40 mg daily Continue atenolol 100 mg daily   2.  Hyperlipidemia Continue statin  3.Hypertension Well controlled.  Monitor with uptitrated dose of Imdur.  Discharge today.     For questions or updates, please contact [facility] Please consult [email or web address] for contact info under Cardiology/STEMI.      Signed, Lars Mage, MD  01/06/2021, 9:56 AM

## 2021-01-06 NOTE — Plan of Care (Signed)

## 2021-01-06 NOTE — Discharge Summary (Signed)
Discharge Summary    Patient ID: Joshua [name]. MRN: [id number]; DOB: 05-29-1941  Admit date: 01/04/2021 Discharge date: 01/06/2021  Primary Care Provider: Dickie La, MD  Primary Cardiologist: Sinclair Grooms, MD  Primary Electrophysiologist:  None   Discharge Diagnoses    Principal Problem:   Unstable angina [facility]) Active Problems:   Hyperlipidemia with target low density lipoprotein (LDL) cholesterol less than 70 mg/dL   Essential hypertension   CAD (coronary artery disease)   Presence of drug coated stent in SVG-RCA   Acute chest pain    Diagnostic Studies/Procedures    Cath: 01/04/21   Ostial occlusion of the left main and native right coronary.  Occlusion of saphenous vein graft to the diagonal.  Patent right internal mammary artery graft to the mid LAD.  Patent left internal mammary artery graft to the circumflex marginal.  Patent saphenous vein graft to the distal RCA which could not be selectively engaged due to overhanging ostial stents.  The distal two thirds of the vessel appeared to be widely patent.  There could be high-grade obstruction at the distal anastomosis or in the native RCA.  Overall normal LV function with normal LVEDP.  EF greater than 50%.  RECOMMENDATIONS:   Patient with advanced atherosclerosis, failed saphenous vein grafts including inability to selectively engaged the saphenous vein graft to the right coronary due to overhanging ostial stent placed in 2018.  Given the current findings, he is really not a candidate for repeated angiography as intervention is not possible unless there is clear evidence of anterior MI in which case perhaps right radial approach could be used to attempt selective engagement of the [place] and perform salvage PCI on the LAD.  Same is true if there is concern for circumflex territory MI, perhaps the LIMA could be used as a conduit to treat native circumflex disease.  Diagnostic Dominance:  Co-dominant   Echo: 01/05/21  IMPRESSIONS    1. Left ventricular ejection fraction, by estimation, is 55 to 60%. The  left ventricle has normal function. The left ventricle has no regional  wall motion abnormalities. There is moderate left ventricular hypertrophy.  Left ventricular diastolic  parameters were normal.  2. Right ventricular systolic function is mildly reduced. The right  ventricular size is normal. There is mildly elevated pulmonary artery  systolic pressure. The estimated right ventricular systolic pressure is  Q000111Q mmHg.  3. Right atrial size was mildly dilated.  4. The mitral valve is normal in structure. Trivial mitral valve  regurgitation. No evidence of mitral stenosis.  5. The aortic valve is abnormal. There is moderate calcification of the  aortic valve. Aortic valve regurgitation is not visualized. Mild aortic  valve sclerosis is present, with no evidence of aortic valve stenosis.  6. The inferior vena cava is dilated in size with >50% respiratory  variability, suggesting right atrial pressure of 8 mmHg.  _____________   History of Present Illness     Joshua [name]. is a 80 y.o. male with above hx of CAD and CABG X 4 22 years ago with RIMA-LAD, LIMA to LCX, VG to diag and VG to RCA. Stents to VG to RCA bare metal and last cath 06/24/17 with NSTEMI due to obstruction of VG to RCA with in stent restenosis, totally occluded VG to diag. Patent RIMA to LAD and patent LIMA to OM, PCI to in stent restenosis of VG to RCA.  Normal LV function.  He had DOE and nuc study  05/23/19 with normal nuc study.  His imdur was increased. Echo 2018 with EF 55-60%, mild MR, LA mildly dilated and PA pk pressure was 43 mmHg   Pt had called office with chest tightness especillay when bending to put on shoes.  Felt like someone squeezing chest.   Episode just prior to admission lasted 40 min.  He has chronic DOE.  The tightness reminded him of prior need for cath.  No associated  symptoms.  Planned to try NTG if reoccurred and was to see Dr. Tamala Julian in follow up. Presented to the ED on 2/4 with recurrent chest pain.  He took sl NTG and he was better in 5 min. Called the office and was sent to the ED.  Labs in the ED showed: Na 137, K+ 4.0 BUN 16, Cr 0.99  Hs troponin 12 hgb 14, plts 145 WBC 6.5  2V CXR NAD  EKG:  The ECG that was done today 01/04/21 showed SR to SB with PACs and PVCs and old RBBB no acute ST changes.    BP 152/70 P 50s R 22 sp02 on RA 96%  Afebrile.  No pain since the NTG.    He was admitted with plans to undergo cardiac cath.   Hospital Course     Underwent cardiac cath noted above with no disease amenable to PCI per Dr. Tamala Julian. Plan is to continue with medical therapy. His Imdur was increased from 90mg  daily to 120mg  daily. Continue on home medications of ASA, statin, BB without further adjustment. Follow up echo showed normal EF with no rWMA noted. He was able to ambulate in the hallway without recurrent chest pain.   Seen by Dr. Quentin Ore and determined stable for discharge home. Follow up in the office has been arranged.   Did the patient have an acute coronary syndrome (MI, NSTEMI, STEMI, etc) this admission?:  No                               Did the patient have a percutaneous coronary intervention (stent / angioplasty)?:  No.       _____________  Discharge Vitals Blood pressure 133/78, pulse (!) 57, temperature 98.3 F (36.8 C), temperature source Oral, resp. rate 16, height 5\' 8"  (1.727 m), weight 100.9 kg, SpO2 92 %.  Filed Weights   01/04/21 0938 01/05/21 0432  Weight: 101.2 kg 100.9 kg    Labs & Radiologic Studies    CBC Recent Labs    01/04/21 2036 01/05/21 0214  WBC 6.5 6.4  HGB 13.5 13.6  HCT 40.9 41.1  MCV 86.7 87.1  PLT 154 Q000111Q*   Basic Metabolic Panel Recent Labs    01/04/21 0953 01/04/21 1448 01/05/21 0214  NA 137  --  134*  K 4.0  --  3.8  CL 102  --  98  CO2 24  --  26  GLUCOSE 123*  --  103*  BUN 16   --  14  CREATININE 0.99 0.93 0.96  CALCIUM 9.3  --  9.3  MG  --  1.8  --    Liver Function Tests No results for input(s): AST, ALT, ALKPHOS, BILITOT, PROT, ALBUMIN in the last 72 hours. No results for input(s): LIPASE, AMYLASE in the last 72 hours. High Sensitivity Troponin:   Recent Labs  Lab 01/04/21 0953 01/04/21 1222  TROPONINIHS 12 11    BNP Invalid input(s): POCBNP D-Dimer No results for input(s): DDIMER in  the last 72 hours. Hemoglobin A1C Recent Labs    01/04/21 1448  HGBA1C 6.5*   Fasting Lipid Panel Recent Labs    01/05/21 0214  CHOL 151  HDL 25*  LDLCALC 67  TRIG 294*  CHOLHDL 6.0   Thyroid Function Tests Recent Labs    01/04/21 1448  TSH 3.057   _____________  DG Chest 2 View  Result Date: 01/04/2021 CLINICAL DATA:  Chest pain, shortness of breath. EXAM: CHEST - 2 VIEW COMPARISON:  May 18, 2019. FINDINGS: The heart size and mediastinal contours are within normal limits. Both lungs are clear. No pneumothorax or pleural effusion is noted. Sternotomy wires are noted. The visualized skeletal structures are unremarkable. IMPRESSION: No active cardiopulmonary disease. Electronically Signed   By: Marijo Conception M.D.   On: 01/04/2021 10:02   MR Brain W Wo Contrast  Result Date: 12/11/2020 CLINICAL DATA:  Headache, new or worsening.  Vision changes. EXAM: MRI HEAD WITHOUT AND WITH CONTRAST TECHNIQUE: Multiplanar, multiecho pulse sequences of the brain and surrounding structures were obtained without and with intravenous contrast. CONTRAST:  4mL GADAVIST GADOBUTROL 1 MMOL/ML IV SOLN COMPARISON:  MRI June 28, 2016. FINDINGS: Brain: No acute infarction, hemorrhage, hydrocephalus, extra-axial collection or mass lesion. Similar appearance of a dilated perivascular space versus remote lacunar infarct in the left basal ganglia. Scattered T2/FLAIR hyperintensities within the white matter and pons, most likely related to chronic microvascular ischemic disease. No abnormal  enhancement. Vascular: Major arterial flow voids are maintained at the skull base. Skull and upper cervical spine: Normal marrow signal. Sinuses/Orbits: Mild scattered paranasal sinus mucosal thickening without air-fluid levels. Unremarkable orbits. Other: No sizable mastoid effusion. IMPRESSION: 1. No evidence of acute intracranial abnormality. 2. Similar chronic microvascular ischemic disease and remote lacunar infarct versus dilated perivascular space in the left basal ganglia. Electronically Signed   By: Margaretha Sheffield MD   On: 12/11/2020 07:39   CARDIAC CATHETERIZATION  Result Date: 01/04/2021  Ostial occlusion of the left main and native right coronary.  Occlusion of saphenous vein graft to the diagonal.  Patent right internal mammary artery graft to the mid LAD.  Patent left internal mammary artery graft to the circumflex marginal.  Patent saphenous vein graft to the distal RCA which could not be selectively engaged due to overhanging ostial stents.  The distal two thirds of the vessel appeared to be widely patent.  There could be high-grade obstruction at the distal anastomosis or in the native RCA.  Overall normal LV function with normal LVEDP.  EF greater than 50%. RECOMMENDATIONS:  Patient with advanced atherosclerosis, failed saphenous vein grafts including inability to selectively engaged the saphenous vein graft to the right coronary due to overhanging ostial stent placed in 2018.  Given the current findings, he is really not a candidate for repeated angiography as intervention is not possible unless there is clear evidence of anterior MI in which case perhaps right radial approach could be used to attempt selective engagement of the [place] and perform salvage PCI on the LAD.  Same is true if there is concern for circumflex territory MI, perhaps the LIMA could be used as a conduit to treat native circumflex disease.  ECHOCARDIOGRAM COMPLETE  Result Date: 01/05/2021    ECHOCARDIOGRAM  REPORT   Patient Name:   Colman Whale. Date of Exam: 01/05/2021 Medical Rec #:  [id number]            Height:       68.0 in Accession #:  [id number]           Weight:       222.4 lb Date of Birth:  1941/06/25            BSA:          2.138 m Patient Age:    54 years             BP:           151/84 mmHg Patient Gender: M                    HR:           61 bpm. Exam Location:  Inpatient Procedure: 2D Echo, Cardiac Doppler, Color Doppler and Intracardiac            Opacification Agent Indications:    Chest pain  History:        Patient has prior history of Echocardiogram examinations, most                 recent 02/26/2017. CAD, Angina and Previous Myocardial                 Infarction, Signs/Symptoms:Chest Pain; Risk Factors:Dyslipidemia                 and Hypertension.  Sonographer:    Gambrel Lefort RDCS (AE) Referring Phys: [place]  Sonographer Comments: Technically difficult study due to poor echo windows. IMPRESSIONS  1. Left ventricular ejection fraction, by estimation, is 55 to 60%. The left ventricle has normal function. The left ventricle has no regional wall motion abnormalities. There is moderate left ventricular hypertrophy. Left ventricular diastolic parameters were normal.  2. Right ventricular systolic function is mildly reduced. The right ventricular size is normal. There is mildly elevated pulmonary artery systolic pressure. The estimated right ventricular systolic pressure is Q000111Q mmHg.  3. Right atrial size was mildly dilated.  4. The mitral valve is normal in structure. Trivial mitral valve regurgitation. No evidence of mitral stenosis.  5. The aortic valve is abnormal. There is moderate calcification of the aortic valve. Aortic valve regurgitation is not visualized. Mild aortic valve sclerosis is present, with no evidence of aortic valve stenosis.  6. The inferior vena cava is dilated in size with >50% respiratory variability, suggesting right atrial pressure of 8 mmHg. FINDINGS   Left Ventricle: Left ventricular ejection fraction, by estimation, is 55 to 60%. The left ventricle has normal function. The left ventricle has no regional wall motion abnormalities. Definity contrast agent was given IV to delineate the left ventricular  endocardial borders. The left ventricular internal cavity size was normal in size. There is moderate left ventricular hypertrophy. Left ventricular diastolic parameters were normal. Right Ventricle: The right ventricular size is normal. No increase in right ventricular wall thickness. Right ventricular systolic function is mildly reduced. There is mildly elevated pulmonary artery systolic pressure. The tricuspid regurgitant velocity  is 2.85 m/s, and with an assumed right atrial pressure of 8 mmHg, the estimated right ventricular systolic pressure is Q000111Q mmHg. Left Atrium: Left atrial size was normal in size. Right Atrium: Right atrial size was mildly dilated. Pericardium: There is no evidence of pericardial effusion. Mitral Valve: The mitral valve is normal in structure. Trivial mitral valve regurgitation. No evidence of mitral valve stenosis. MV peak gradient, 4.2 mmHg. The mean mitral valve gradient is 2.0 mmHg. Tricuspid Valve: The tricuspid valve is normal in structure. Tricuspid valve regurgitation is mild .  No evidence of tricuspid stenosis. Aortic Valve: The aortic valve is abnormal. There is moderate calcification of the aortic valve. Aortic valve regurgitation is not visualized. Mild aortic valve sclerosis is present, with no evidence of aortic valve stenosis. Aortic valve mean gradient measures 6.0 mmHg. Aortic valve peak gradient measures 10.5 mmHg. Aortic valve area, by VTI measures 1.94 cm. Pulmonic Valve: The pulmonic valve was not well visualized. Pulmonic valve regurgitation is trivial. No evidence of pulmonic stenosis. Aorta: The aortic root is normal in size and structure. Venous: The inferior vena cava is dilated in size with greater than 50%  respiratory variability, suggesting right atrial pressure of 8 mmHg. IAS/Shunts: No atrial level shunt detected by color flow Doppler.  LEFT VENTRICLE PLAX 2D LVIDd:         4.30 cm  Diastology LVIDs:         3.00 cm  LV e' medial:    7.71 cm/s LV PW:         1.50 cm  LV E/e' medial:  10.3 LV IVS:        1.50 cm  LV e' lateral:   8.06 cm/s LVOT diam:     2.00 cm  LV E/e' lateral: 9.9 LV SV:         71 LV SV Index:   33 LVOT Area:     3.14 cm  RIGHT VENTRICLE            IVC RV Basal diam:  4.50 cm    IVC diam: 2.10 cm RV Mid diam:    2.90 cm RV S prime:     9.37 cm/s TAPSE (M-mode): 1.3 cm LEFT ATRIUM             Index       RIGHT ATRIUM           Index LA diam:        4.60 cm 2.15 cm/m  RA Area:     27.10 cm LA Vol (A2C):   58.9 ml 27.55 ml/m RA Volume:   91.20 ml  42.66 ml/m LA Vol (A4C):   44.3 ml 20.72 ml/m LA Biplane Vol: 51.2 ml 23.95 ml/m  AORTIC VALVE AV Area (Vmax):    2.04 cm AV Area (Vmean):   1.74 cm AV Area (VTI):     1.94 cm AV Vmax:           162.00 cm/s AV Vmean:          118.000 cm/s AV VTI:            0.365 m AV Peak Grad:      10.5 mmHg AV Mean Grad:      6.0 mmHg LVOT Vmax:         105.00 cm/s LVOT Vmean:        65.400 cm/s LVOT VTI:          0.225 m LVOT/AV VTI ratio: 0.62  AORTA Ao Root diam: 3.40 cm Ao Asc diam:  3.20 cm MITRAL VALVE               TRICUSPID VALVE MV Area (PHT): 3.06 cm    TR Peak grad:   32.5 mmHg MV Area VTI:   1.50 cm    TR Vmax:        285.00 cm/s MV Peak grad:  4.2 mmHg MV Mean grad:  2.0 mmHg    SHUNTS MV Vmax:       1.02 m/s  Systemic VTI:  0.22 m MV Vmean:      56.0 cm/s   Systemic Diam: 2.00 cm MV Decel Time: 248 msec MV E velocity: 79.70 cm/s MV A velocity: 84.80 cm/s MV E/A ratio:  0.94 Cherlynn Kaiser MD Electronically signed by Cherlynn Kaiser MD Signature Date/Time: 01/05/2021/1:18:27 PM    Final    Disposition   Pt is being discharged home today in good condition.  Follow-up Plans & Appointments     Follow-up Information    Burtis Junes,  NP Follow up on 01/08/2021.   Specialties: Nurse Practitioner, Interventional Cardiology, Cardiology, Radiology Why: at 9:45am for your follow up appt Contact information: [place]. [address] [place] [address] [phone]              Discharge Instructions    Amb Referral to Cardiac Rehabilitation   Complete by: As directed    Diagnosis: Stable Angina   After initial evaluation and assessments completed: Virtual Based Care may be provided alone or in conjunction with Phase 2 Cardiac Rehab based on patient barriers.: Yes   Call MD for:  difficulty breathing, headache or visual disturbances   Complete by: As directed    Call MD for:  persistant dizziness or light-headedness   Complete by: As directed    Call MD for:  redness, tenderness, or signs of infection (pain, swelling, redness, odor or green/yellow discharge around incision site)   Complete by: As directed    Diet - low sodium heart healthy   Complete by: As directed    Discharge instructions   Complete by: As directed    Groin Site Care Refer to this sheet in the next few weeks. These instructions provide you with information on caring for yourself after your procedure. Your caregiver may also give you more specific instructions. Your treatment has been planned according to current medical practices, but problems sometimes occur. Call your caregiver if you have any problems or questions after your procedure. HOME CARE INSTRUCTIONS You may shower 24 hours after the procedure. Remove the bandage (dressing) and gently wash the site with plain soap and water. Gently pat the site dry.  Do not apply powder or lotion to the site.  Do not sit in a bathtub, swimming pool, or whirlpool for 5 to 7 days.  No bending, squatting, or lifting anything over 10 pounds (4.5 kg) as directed by your caregiver.  Inspect the site at least twice daily.  Do not drive home if you are discharged the same day of the procedure. Have  someone else drive you.  You may drive 24 hours after the procedure unless otherwise instructed by your caregiver.  What to expect: Any bruising will usually fade within 1 to 2 weeks.  Blood that collects in the tissue (hematoma) may be painful to the touch. It should usually decrease in size and tenderness within 1 to 2 weeks.  SEEK IMMEDIATE MEDICAL CARE IF: You have unusual pain at the groin site or down the affected leg.  You have redness, warmth, swelling, or pain at the groin site.  You have drainage (other than a small amount of blood on the dressing).  You have chills.  You have a fever or persistent symptoms for more than 72 hours.  You have a fever and your symptoms suddenly get worse.  Your leg becomes pale, cool, tingly, or numb.  You have heavy bleeding from the site. Hold pressure on the site. .   Increase activity slowly  Complete by: As directed       Discharge Medications   Allergies as of 01/06/2021      Reactions   Ace Inhibitors Cough   Atorvastatin Other (See Comments)   Muscle pain   Rosuvastatin Other (See Comments)   Muscle pain   Plavix [clopidogrel Bisulfate] Itching, Rash   Sulfa Antibiotics Itching, Rash   Sulfonamide Derivatives Itching, Rash      Medication List    TAKE these medications   ALPRAZolam 0.5 MG tablet Commonly known as: XANAX TAKE 1 TABLET BY MOUTH AT  BEDTIME AS NEEDED FOR  RESTLESS LEGS What changed: See the new instructions.   aspirin 81 MG EC tablet Take 1 tablet (81 mg total) by mouth daily.   atenolol-chlorthalidone 100-25 MG tablet Commonly known as: TENORETIC TAKE 1 TABLET BY MOUTH  DAILY   citalopram 20 MG tablet Commonly known as: CELEXA TAKE 1 TABLET BY MOUTH  DAILY What changed:   how much to take  how to take this  when to take this  additional instructions   esomeprazole 20 MG capsule Commonly known as: NEXIUM Take 1 capsule (20 mg total) by mouth daily.   ezetimibe 10 MG tablet Commonly known  as: ZETIA TAKE 1 TABLET BY MOUTH  DAILY   gabapentin 300 MG capsule Commonly known as: NEURONTIN Take 300 mg by mouth 3 (three) times daily. 2 tab in the morning, 1 at noon, and 2 at bedtime   isosorbide mononitrate 120 MG 24 hr tablet Commonly known as: IMDUR Take 1 tablet (120 mg total) by mouth daily. Start taking on: January 07, 2021 What changed:   medication strength  how much to take   nitroGLYCERIN 0.4 MG SL tablet Commonly known as: NITROSTAT Place 1 tablet (0.4 mg total) under the tongue every 5 (five) minutes x 3 doses as needed for chest pain.   simvastatin 40 MG tablet Commonly known as: ZOCOR TAKE 1 TABLET BY MOUTH  DAILY What changed: when to take this         Outstanding Labs/Studies   N/a   Duration of Discharge Encounter   Greater than 30 minutes including physician time.  Signed, Reino Bellis, NP 01/06/2021, 10:46 AM

## 2021-01-07 ENCOUNTER — Encounter (HOSPITAL_COMMUNITY): Payer: Self-pay | Admitting: Interventional Cardiology

## 2021-01-08 ENCOUNTER — Other Ambulatory Visit: Payer: Self-pay

## 2021-01-08 ENCOUNTER — Ambulatory Visit: Payer: Medicare Other | Admitting: Nurse Practitioner

## 2021-01-08 ENCOUNTER — Encounter: Payer: Self-pay | Admitting: Nurse Practitioner

## 2021-01-08 VITALS — BP 140/60 | HR 53 | Ht 68.0 in | Wt 224.0 lb

## 2021-01-08 DIAGNOSIS — I10 Essential (primary) hypertension: Secondary | ICD-10-CM | POA: Diagnosis not present

## 2021-01-08 DIAGNOSIS — E785 Hyperlipidemia, unspecified: Secondary | ICD-10-CM

## 2021-01-08 DIAGNOSIS — I25119 Atherosclerotic heart disease of native coronary artery with unspecified angina pectoris: Secondary | ICD-10-CM | POA: Diagnosis not present

## 2021-01-08 MED ORDER — NITROGLYCERIN 0.4 MG SL SUBL
0.4000 mg | SUBLINGUAL_TABLET | SUBLINGUAL | 1 refills | Status: DC | PRN
Start: 2021-01-08 — End: 2022-03-04

## 2021-01-08 NOTE — Patient Instructions (Signed)
After Visit Summary:  We will be checking the following labs today - NONE   Medication Instructions:    Continue with your current medicines.  I have refilled the NTG today.     If you need a refill on your cardiac medications before your next appointment, please call your pharmacy.     Testing/Procedures To Be Arranged:  N/A  Follow-Up:   See Dr. Tamala Julian in about 3 months.     At [facility], you and your health needs are our priority.  As part of our continuing mission to provide you with exceptional heart care, we have created designated Provider Care Teams.  These Care Teams include your primary Cardiologist (physician) and Advanced Practice Providers (APPs -  Physician Assistants and Nurse Practitioners) who all work together to provide you with the care you need, when you need it.  Special Instructions:  . Stay safe, wash your hands for at least 20 seconds and wear a mask when needed.  . It was good to talk with you today.    Call the [place] office at [phone] if you have any questions, problems or concerns.

## 2021-01-09 ENCOUNTER — Telehealth (HOSPITAL_COMMUNITY): Payer: Self-pay

## 2021-01-09 NOTE — Telephone Encounter (Signed)
Attempted to call patient in regards to Cardiac Rehab - LM on VM 

## 2021-01-09 NOTE — Telephone Encounter (Signed)
Pt insurance is active and benefits verified through [facility]. Co-pay $0.00, DED $0.00/$0.00 met, out of pocket $4,500.00/$115.00 met, co-insurance 0%. No pre-authorization required. Passport, 01/09/21 @ 11:32AM, WGY#[id number]  Will contact patient to see if he is interested in the Cardiac Rehab Program. If interested, patient will need to complete follow up appt. Once completed, patient will be contacted for scheduling upon review by the RN Navigator.

## 2021-01-23 ENCOUNTER — Telehealth (HOSPITAL_COMMUNITY): Payer: Self-pay

## 2021-01-23 NOTE — Telephone Encounter (Signed)
Called and spoke with pt wife [name] who stated pt is not interested at this time.  Closed referral

## 2021-01-27 ENCOUNTER — Other Ambulatory Visit: Payer: Self-pay | Admitting: Family Medicine

## 2021-01-30 DIAGNOSIS — H532 Diplopia: Secondary | ICD-10-CM | POA: Diagnosis not present

## 2021-02-04 DIAGNOSIS — H532 Diplopia: Secondary | ICD-10-CM | POA: Diagnosis not present

## 2021-02-06 ENCOUNTER — Ambulatory Visit: Payer: Medicare Other | Admitting: Cardiology

## 2021-03-08 ENCOUNTER — Other Ambulatory Visit: Payer: Self-pay | Admitting: Family Medicine

## 2021-04-07 NOTE — Progress Notes (Signed)
Cardiology Office Note:    Date:  04/09/2021   ID:  Doc Mandala., DOB 10/16/1941, MRN [id number]  PCP:  Dickie La, MD  Cardiologist:  Sinclair Grooms, MD   Referring MD: Dickie La, MD   Chief Complaint  Patient presents with  . Coronary Artery Disease    History of Present Illness:    Joshua T Theodore Virgin. is a 80 y.o. male with a hx of coronary artery disease status post bypass surgery in 1990 and subsequent PCI to the SVG-RCA with a bare-metal stent in 2011. He was admitted in July 2018 with non-ST elevation myocardial infarction and underwent PCI with a DES x2 to the SVG-RCA. Seen Feb. 2019 with angina and noted to have anemia. Cath February 2022 for chest pain unchanged.  Had coronary angio performed in February because of chest pain.  Anatomy was unchanged.  He has chronic stable angina.  Does not use nitroglycerin to any significant degree.  Gaining weight.  Decreasing physical activity.  Denied syncope and palpitations.  No claudication.  Past Medical History:  Diagnosis Date  . Coronary artery disease involving native coronary artery of native heart with angina pectoris ([place])    a. 1990 s/p CABG x 4 (RIMA->LAD, LIMA->LCX, VG->Diag, VG->RCA); b. 2011 s/p BMS to VG->RCA.; c. Unstable Angina 05/2017: Patent RIMA-LAD & LIMA-LCx, ostSVG-RCA 65% ISR & mSVG-RCA  99% - DES PCI to both // Myoview 05/2019:  EF 57, no ischemia or scar, Low Risk   . GERD (gastroesophageal reflux disease)   . History of echocardiogram    a. 01/2017 Echo: EF 55-60%, no rwma, mild MR, midlly dil LA, PASP 23mmHg.  Marland Kitchen History of hiatal hernia   . Hyperlipidemia   . Hypertension   . Obesity     Past Surgical History:  Procedure Laterality Date  . CATARACT EXTRACTION W/ INTRAOCULAR LENS  IMPLANT, BILATERAL Bilateral   . CORONARY ANGIOPLASTY WITH STENT PLACEMENT  2011   "RCA"  . CORONARY ARTERY BYPASS GRAFT  1990   CABG X4  . CORONARY STENT INTERVENTION N/A 06/24/2017   Procedure: Coronary  Stent Intervention;  Surgeon: Belva Crome, MD;  Location: [facility];  Service: Cardiovascular: Ostial 4.0 x 12 mm Onyx DES reducing 70% stenosis to less than 40%. Distal body 3.5 x 15 Onyx DES reducing 99% stenosis to less than 30%.  Marland Kitchen LEFT HEART CATH AND CORS/GRAFTS ANGIOGRAPHY N/A 06/24/2017   Procedure: Left Heart Cath and Cors/Grafts Angiography;  Surgeon: Belva Crome, MD;  Location: [facility];  Service: Cardiovascular:  CTO native RCA, LAD & LCx (Graft Dependent). Patent LIMA-LCx & RIMA-LAD.  SVG-RCA: ost 65% ISR & mid 99% --> DES PCI to both  . LEFT HEART CATH AND CORS/GRAFTS ANGIOGRAPHY N/A 01/04/2021   Procedure: LEFT HEART CATH AND CORS/GRAFTS ANGIOGRAPHY;  Surgeon: Belva Crome, MD;  Location: [facility];  Service: Cardiovascular;  Laterality: N/A;    Current Medications: Current Meds  Medication Sig  . ALPRAZolam (XANAX) 0.5 MG tablet TAKE 1 TABLET BY MOUTH AT  BEDTIME AS NEEDED FOR  RESTLESS LEGS  . aspirin EC 81 MG EC tablet Take 1 tablet (81 mg total) by mouth daily.  Marland Kitchen atenolol-chlorthalidone (TENORETIC) 100-25 MG tablet TAKE 1 TABLET BY MOUTH  DAILY  . citalopram (CELEXA) 20 MG tablet TAKE 1 TABLET BY MOUTH  DAILY (Patient taking differently: TAKE 1 TABLET BY MOUTH  DAILY)  . esomeprazole (NEXIUM) 20 MG capsule  Take 1 capsule (20 mg total) by mouth daily.  Marland Kitchen ezetimibe (ZETIA) 10 MG tablet TAKE 1 TABLET BY MOUTH  DAILY  . gabapentin (NEURONTIN) 300 MG capsule Take 300 mg by mouth 3 (three) times daily. 2 tab in the morning, 1 at noon, and 2 at bedtime  . isosorbide mononitrate (IMDUR) 120 MG 24 hr tablet Take 1 tablet (120 mg total) by mouth daily.  . nitroGLYCERIN (NITROSTAT) 0.4 MG SL tablet Place 1 tablet (0.4 mg total) under the tongue every 5 (five) minutes x 3 doses as needed for chest pain. 4 bottles #25 please.  . simvastatin (ZOCOR) 40 MG tablet TAKE 1 TABLET BY MOUTH  DAILY     Allergies:   Ace inhibitors, Atorvastatin, Rosuvastatin,  Plavix [clopidogrel bisulfate], Sulfa antibiotics, and Sulfonamide derivatives   Social History   Socioeconomic History  . Marital status: Married    Spouse name: Not on file  . Number of children: Not on file  . Years of education: Not on file  . Highest education level: Not on file  Occupational History  . Occupation: retired    Comment: Retail buyer (truck driver, heating and air)  Tobacco Use  . Smoking status: Never Smoker  . Smokeless tobacco: Former Systems developer    Types: Secondary school teacher  . Vaping Use: Never used  Substance and Sexual Activity  . Alcohol use: Yes    Alcohol/week: 0.0 standard drinks    Comment: 06/23/2017 "a few drinks/ month  . Drug use: No  . Sexual activity: Not Currently  Other Topics Concern  . Not on file  Social History Narrative   Lives locally with his wife [name] - former [employer] ECG tech).  Does not routinely exercise.   Social Determinants of Health   Financial Resource Strain: Not on file  Food Insecurity: Not on file  Transportation Needs: Not on file  Physical Activity: Not on file  Stress: Not on file  Social Connections: Not on file     Family History: The patient's family history includes Cancer in his mother; Coronary artery disease in his father; Heart attack in his father; Hyperlipidemia in his son; Hypertension in his son.  ROS:   Please see the history of present illness.    Has hip and knee discomfort with walking.  No neurological concerns.  All other systems reviewed and are negative.  EKGs/Labs/Other Studies Reviewed:    The following studies were reviewed today:  CARDIAC CATH 01/2021: Diagnostic Dominance: Co-dominant    Ostial occlusion of the left main and native right coronary.  Occlusion of saphenous vein graft to the diagonal.  Patent right internal mammary artery graft to the mid LAD.  Patent left internal mammary artery graft to the circumflex marginal.  Patent saphenous vein graft to the distal RCA which  could not be selectively engaged due to overhanging ostial stents.  The distal two thirds of the vessel appeared to be widely patent.  There could be high-grade obstruction at the distal anastomosis or in the native RCA.  Overall normal LV function with normal LVEDP.  EF greater than 50%.    EKG:  EKG not repeated  Recent Labs: 05/30/2020: ALT 25 08/01/2020: NT-Pro BNP 106 01/04/2021: Magnesium 1.8; TSH 3.057 01/05/2021: BUN 14; Creatinine, Ser 0.96; Hemoglobin 13.6; Platelets 141; Potassium 3.8; Sodium 134  Recent Lipid Panel    Component Value Date/Time   CHOL 151 01/05/2021 0214   CHOL 136 10/06/2017 0740   TRIG 294 (H) 01/05/2021 0214  HDL 25 (L) 01/05/2021 0214   HDL 27 (L) 10/06/2017 0740   CHOLHDL 6.0 01/05/2021 0214   VLDL 59 (H) 01/05/2021 0214   LDLCALC 67 01/05/2021 0214   LDLCALC 79 10/06/2017 0740   LDLDIRECT 87 05/30/2020 1231   LDLDIRECT 84 05/14/2016 1234    Physical Exam:    VS:  BP 102/60   Pulse (!) 52   Ht 5\' 8"  (1.727 m)   Wt 222 lb (100.7 kg)   SpO2 98%   BMI 33.75 kg/m     Wt Readings from Last 3 Encounters:  04/09/21 222 lb (100.7 kg)  01/08/21 224 lb (101.6 kg)  01/05/21 222 lb 6.4 oz (100.9 kg)     GEN: Obese. No acute distress HEENT: Normal NECK: No JVD. LYMPHATICS: No lymphadenopathy CARDIAC: No murmur. RRR S4 gallop, but no edema. VASCULAR:  Normal Pulses. No bruits. RESPIRATORY:  Clear to auscultation without rales, wheezing or rhonchi  ABDOMEN: Soft, non-tender, non-distended, No pulsatile mass, MUSCULOSKELETAL: No deformity  SKIN: Warm and dry NEUROLOGIC:  Alert and oriented x 3 PSYCHIATRIC:  Normal affect   ASSESSMENT:    1. Coronary artery disease involving native coronary artery of native heart with angina pectoris ([place])   2. Essential hypertension   3. Hyperlipidemia, unspecified hyperlipidemia type   4. Shortness of breath   5. Type 2 diabetes mellitus with complication, without long-term current use of insulin (H[facility])     PLAN:    In order of problems listed above:  1. Prevention discussed.  Added to the concern is glucose intolerance.  Hemoglobin A1c was recently documented to be 6.5.  Discussion concerning carbohydrate control and increased physical activity. 2. Excellent blood pressure control 3. Continue high intensity statin therapy.  Triglycerides elevated but we cannot afford therapy.  We discussed Vascepa. 4. Stable 5. Target A1c less than 7.  Again discussed carbohydrate control and diet and an increase physical activity.  Overall education and awareness concerning secondary risk prevention was discussed in detail: LDL less than 70, hemoglobin A1c less than 7, blood pressure target less than 130/80 mmHg, >150 minutes of moderate aerobic activity per week, avoidance of smoking, weight control (via diet and exercise), and continued surveillance/management of/for obstructive sleep apnea.    Medication Adjustments/Labs and Tests Ordered: Current medicines are reviewed at length with the patient today.  Concerns regarding medicines are outlined above.  No orders of the defined types were placed in this encounter.  No orders of the defined types were placed in this encounter.   There are no Patient Instructions on file for this visit.   Signed, Sinclair Grooms, MD  04/09/2021 10:13 AM    [place]

## 2021-04-09 ENCOUNTER — Ambulatory Visit: Payer: Medicare Other | Admitting: Interventional Cardiology

## 2021-04-09 ENCOUNTER — Other Ambulatory Visit: Payer: Self-pay

## 2021-04-09 ENCOUNTER — Encounter: Payer: Self-pay | Admitting: Interventional Cardiology

## 2021-04-09 VITALS — BP 102/60 | HR 52 | Ht 68.0 in | Wt 222.0 lb

## 2021-04-09 DIAGNOSIS — I10 Essential (primary) hypertension: Secondary | ICD-10-CM | POA: Diagnosis not present

## 2021-04-09 DIAGNOSIS — E785 Hyperlipidemia, unspecified: Secondary | ICD-10-CM

## 2021-04-09 DIAGNOSIS — E118 Type 2 diabetes mellitus with unspecified complications: Secondary | ICD-10-CM | POA: Diagnosis not present

## 2021-04-09 DIAGNOSIS — I25119 Atherosclerotic heart disease of native coronary artery with unspecified angina pectoris: Secondary | ICD-10-CM | POA: Diagnosis not present

## 2021-04-09 DIAGNOSIS — R0602 Shortness of breath: Secondary | ICD-10-CM

## 2021-04-09 NOTE — Patient Instructions (Addendum)
Medication Instructions:  Your physician recommends that you continue on your current medications as directed. Please refer to the Current Medication list given to you today.  *If you need a refill on your cardiac medications before your next appointment, please call your pharmacy*   Lab Work: None If you have labs (blood work) drawn today and your tests are completely normal, you will receive your results only by: Marland Kitchen MyChart Message (if you have MyChart) OR . A paper copy in the mail If you have any lab test that is abnormal or we need to change your treatment, we will call you to review the results.   Testing/Procedures: None   Follow-Up: At [facility], you and your health needs are our priority.  As part of our continuing mission to provide you with exceptional heart care, we have created designated Provider Care Teams.  These Care Teams include your primary Cardiologist (physician) and Advanced Practice Providers (APPs -  Physician Assistants and Nurse Practitioners) who all work together to provide you with the care you need, when you need it.  We recommend signing up for the patient portal called "MyChart".  Sign up information is provided on this After Visit Summary.  MyChart is used to connect with patients for Virtual Visits (Telemedicine).  Patients are able to view lab/test results, encounter notes, upcoming appointments, etc.  Non-urgent messages can be sent to your provider as well.   To learn more about what you can do with MyChart, go to [email or web address].    Your next appointment:   9-12 month(s)  The format for your next appointment:   In Person  Provider:   You may see Sinclair Grooms, MD or one of the following Advanced Practice Providers on your designated Care Team:    Kathyrn Drown, NP    Other Instructions Watch carbohydrate intake. Your physician recommends that you get a 150 minutes of moderate aerobic activities a week.

## 2021-05-07 ENCOUNTER — Other Ambulatory Visit: Payer: Self-pay | Admitting: Family Medicine

## 2021-05-22 ENCOUNTER — Other Ambulatory Visit: Payer: Self-pay | Admitting: Cardiology

## 2021-05-23 DIAGNOSIS — L918 Other hypertrophic disorders of the skin: Secondary | ICD-10-CM | POA: Diagnosis not present

## 2021-05-23 DIAGNOSIS — L570 Actinic keratosis: Secondary | ICD-10-CM | POA: Diagnosis not present

## 2021-05-23 DIAGNOSIS — L821 Other seborrheic keratosis: Secondary | ICD-10-CM | POA: Diagnosis not present

## 2021-05-23 DIAGNOSIS — L814 Other melanin hyperpigmentation: Secondary | ICD-10-CM | POA: Diagnosis not present

## 2021-05-23 DIAGNOSIS — D225 Melanocytic nevi of trunk: Secondary | ICD-10-CM | POA: Diagnosis not present

## 2021-07-12 ENCOUNTER — Telehealth: Payer: Self-pay

## 2021-07-12 NOTE — Telephone Encounter (Signed)
LVM to have pt call back to schedule AWV.   RE: confirm insurance and schedule AWV on my schedule if times are convenient for patient or other AWV schedule as template permits.   

## 2021-08-21 ENCOUNTER — Ambulatory Visit (INDEPENDENT_AMBULATORY_CARE_PROVIDER_SITE_OTHER): Payer: Medicare Other | Admitting: Family Medicine

## 2021-08-21 ENCOUNTER — Other Ambulatory Visit: Payer: Self-pay

## 2021-08-21 ENCOUNTER — Encounter: Payer: Self-pay | Admitting: Family Medicine

## 2021-08-21 VITALS — BP 142/60 | HR 58 | Wt 228.2 lb

## 2021-08-21 DIAGNOSIS — Z0000 Encounter for general adult medical examination without abnormal findings: Secondary | ICD-10-CM | POA: Diagnosis not present

## 2021-08-21 DIAGNOSIS — I10 Essential (primary) hypertension: Secondary | ICD-10-CM | POA: Diagnosis not present

## 2021-08-21 DIAGNOSIS — I200 Unstable angina: Secondary | ICD-10-CM | POA: Diagnosis not present

## 2021-08-21 DIAGNOSIS — B0229 Other postherpetic nervous system involvement: Secondary | ICD-10-CM | POA: Diagnosis not present

## 2021-08-21 DIAGNOSIS — I25119 Atherosclerotic heart disease of native coronary artery with unspecified angina pectoris: Secondary | ICD-10-CM | POA: Diagnosis not present

## 2021-08-21 DIAGNOSIS — R454 Irritability and anger: Secondary | ICD-10-CM | POA: Diagnosis not present

## 2021-08-21 DIAGNOSIS — Z23 Encounter for immunization: Secondary | ICD-10-CM | POA: Diagnosis not present

## 2021-08-21 LAB — POCT GLYCOSYLATED HEMOGLOBIN (HGB A1C): Hemoglobin A1C: 6.2 % — AB (ref 4.0–5.6)

## 2021-08-21 NOTE — Patient Instructions (Signed)
Continue to do the same with your medicines. Make sure you keep up with the walking. SSee me in 6 months or so. Great to see you!

## 2021-08-22 DIAGNOSIS — Z0000 Encounter for general adult medical examination without abnormal findings: Secondary | ICD-10-CM | POA: Insufficient documentation

## 2021-08-22 LAB — COMPREHENSIVE METABOLIC PANEL
ALT: 19 IU/L (ref 0–44)
AST: 28 IU/L (ref 0–40)
Albumin/Globulin Ratio: 2.1 (ref 1.2–2.2)
Albumin: 4.4 g/dL (ref 3.7–4.7)
Alkaline Phosphatase: 59 IU/L (ref 44–121)
BUN/Creatinine Ratio: 13 (ref 10–24)
BUN: 13 mg/dL (ref 8–27)
Bilirubin Total: 0.4 mg/dL (ref 0.0–1.2)
CO2: 23 mmol/L (ref 20–29)
Calcium: 9.4 mg/dL (ref 8.6–10.2)
Chloride: 99 mmol/L (ref 96–106)
Creatinine, Ser: 0.98 mg/dL (ref 0.76–1.27)
Globulin, Total: 2.1 g/dL (ref 1.5–4.5)
Glucose: 109 mg/dL — ABNORMAL HIGH (ref 65–99)
Potassium: 4.3 mmol/L (ref 3.5–5.2)
Sodium: 137 mmol/L (ref 134–144)
Total Protein: 6.5 g/dL (ref 6.0–8.5)
eGFR: 79 mL/min/{1.73_m2} (ref >59)

## 2021-08-22 NOTE — Assessment & Plan Note (Signed)
Stable with only occasional paresthesias. Continue gabapentin at current dose.

## 2021-08-22 NOTE — Assessment & Plan Note (Signed)
Recently saw cardiology and is doing well.labs reviewed.

## 2021-08-22 NOTE — Assessment & Plan Note (Signed)
Good control.  Continue current meds 

## 2021-08-22 NOTE — Assessment & Plan Note (Signed)
Discussed shingles shot and he will think about it

## 2021-08-22 NOTE — Assessment & Plan Note (Signed)
Stable on citalopram and will continue meds

## 2021-08-22 NOTE — Progress Notes (Signed)
    CHIEF COMPLAINT / HPI:  1. Known CAD: recently saw cardiology and is doing OK. No new symptoms. Compliant with meds 2.has not been as active and intends to get back to that 3. Occasionally has paresthesias around hi right eye still from the zoster but it is minimal. Not sure he would pursue shingles vaccine 4. Mood has been stable on citalopram and no problems with it.  5. Restless legs:Still occasionally uses xanax at night for restless legs sx.   PERTINENT  PMH / PSH: I have reviewed the patient's medications, allergies, past medical and surgical history, smoking status and updated in the EMR as appropriate.   OBJECTIVE:  BP (!) 142/60   Pulse (!) 58   Wt 228 lb 3.2 oz (103.5 kg)   SpO2 96%   BMI 34.70 kg/m  Vital signs reviewed. GENERAL: Well-developed, well-nourished, no acute distress. CARDIOVASCULAR: Regular rate and rhythm no murmur gallop or rub LUNGS: Clear to auscultation bilaterally, no rales or wheeze. ABDOMEN: Soft positive bowel sounds NEURO: No gross focal neurological deficits. MSK: Movement of extremity x 4. PSYCH: AxOx4. Good eye contact.. No psychomotor retardation or agitation. Appropriate speech fluency and content. Asks and answers questions appropriately. Mood is congruent.   ASSESSMENT / PLAN:   No problem-specific Assessment & Plan notes found for this encounter.   Dorcas Mcmurray MD

## 2021-08-23 ENCOUNTER — Encounter: Payer: Self-pay | Admitting: Family Medicine

## 2021-09-24 ENCOUNTER — Ambulatory Visit: Payer: Medicare Other

## 2021-10-01 ENCOUNTER — Other Ambulatory Visit: Payer: Self-pay

## 2021-10-01 ENCOUNTER — Ambulatory Visit (INDEPENDENT_AMBULATORY_CARE_PROVIDER_SITE_OTHER): Payer: Medicare Other

## 2021-10-01 DIAGNOSIS — Z23 Encounter for immunization: Secondary | ICD-10-CM

## 2021-10-11 ENCOUNTER — Other Ambulatory Visit: Payer: Self-pay | Admitting: Family Medicine

## 2021-10-15 ENCOUNTER — Other Ambulatory Visit: Payer: Self-pay | Admitting: Family Medicine

## 2022-01-31 ENCOUNTER — Ambulatory Visit (INDEPENDENT_AMBULATORY_CARE_PROVIDER_SITE_OTHER): Payer: Medicare Other | Admitting: Family Medicine

## 2022-01-31 ENCOUNTER — Other Ambulatory Visit: Payer: Self-pay | Admitting: Family Medicine

## 2022-01-31 VITALS — BP 147/65 | Ht 68.0 in | Wt 220.0 lb

## 2022-01-31 DIAGNOSIS — R1310 Dysphagia, unspecified: Secondary | ICD-10-CM

## 2022-01-31 DIAGNOSIS — R2689 Other abnormalities of gait and mobility: Secondary | ICD-10-CM | POA: Diagnosis not present

## 2022-01-31 DIAGNOSIS — M25511 Pain in right shoulder: Secondary | ICD-10-CM | POA: Diagnosis not present

## 2022-01-31 MED ORDER — DICLOFENAC SODIUM 1 % EX GEL: CUTANEOUS | 2 refills | Status: DC

## 2022-01-31 NOTE — Assessment & Plan Note (Signed)
We will send to physical therapy for gait analysis, gait training, balance evaluation and training as needed.  Follow-up 4 weeks. ?

## 2022-01-31 NOTE — Assessment & Plan Note (Signed)
I think this is a mild rotator cuff strain it seems to be resolving.  We will place him on home exercise program with topical anti-inflammatories and see him back in 3 to 4 weeks. ?

## 2022-01-31 NOTE — Assessment & Plan Note (Signed)
We will start with GI referral.  Follow-up 4 weeks. ?

## 2022-01-31 NOTE — Progress Notes (Signed)
Gi referral 

## 2022-01-31 NOTE — Patient Instructions (Signed)
I have sent a referral in for physical therapy for gait and balance analysis treatment. ? ?Also sent in a prescription for topical diclofenac gel.  If it is really expensive, do not buy that and get over-the-counter Aspercreme instead.  Which ever 1 you used.  On the shoulder area to 3 times a day for the next 2 weeks.  They may be a handout on some shoulder exercises to do for the next couple weeks as well.  If this starts to get worse, come back sooner otherwise I like to see you back in 4 weeks or so. ? ?I will have her follow-up appointment sent for gastroenterology to evaluate your swallowing.  Great to see you! ?

## 2022-01-31 NOTE — Progress Notes (Signed)
Patient was instructed in 10 minutes of therapeutic exercises for right shoulder pain to improve strength, ROM and function according to my instructions and plan of care by a Certified Athletic Trainer during the office visit.  Proper technique shown and discussed, handout provided.  All questions discussed and answered.  ?

## 2022-01-31 NOTE — Progress Notes (Signed)
?  Joshua [name]. - 81 y.o. male MRN [id number]  Date of birth: 1941/09/25 ? ? ? ?SUBJECTIVE:    ?  ?Chief Complaint:/ HPI:  ?#1.  Right shoulder pain.  When he made the appointment was really bothering him.  Noticed it after he was putting his heavy toolbox on the shelf.  Is gotten better but is still somewhat there.  No weakness.  Right-hand-dominant. ?2.  Having balance issues.  If he leans forward, he tends to lose his balance and almost fall.  He is also had some instances of walking down the hallway and he finds himself veering a little bit off to 1 side.  Just generally does not feel like his balance is normal.  This has been going on for several weeks.  No nasal congestion, no head injury that is known. ?3.  He is with his wife who says he is having coughing at every meal.  Feels like he is choking on food or drink multiple times a day.  Does not have a sticking sensation in his throat but does feel like even thin liquids are causing some issues with him. ? ? ? ?OBJECTIVE: BP (!) 147/65   Ht 5\' 8"  (1.727 m)   Wt 220 lb (99.8 kg)   BMI 33.45 kg/m?   ?Physical Exam:  Vital signs are reviewed. ?GENERAL: Well-developed male no acute distress ?SHOULDERS: Symmetrical.  Right shoulder has full range of motion and 5 out of 5 strength in all planes the rotator cuff.  He does have significant pain with supraspinatus testing above 90 degrees and abduction above 90 degrees.  Distally he is neurovascularly intact.  The acromioclavicular joint is nontender.  No defect is noted in the deltoid muscle. ?GAIT: A tiny bit hesitant when he starts the gait but he seems to have normal stride length and balance.  Josefa Half is normal.  Cranial nerves II through XII are grossly intact.  Upper and lower extremity strength and reflexes seem normal and symmetrical. ? ?ASSESSMENT & PLAN: ? ?See problem based charting & AVS for pt instructions. ?No problem-specific Assessment & Plan notes found for this encounter. ? ? ?

## 2022-03-04 ENCOUNTER — Encounter: Payer: Self-pay | Admitting: Family Medicine

## 2022-03-04 ENCOUNTER — Other Ambulatory Visit: Payer: Self-pay | Admitting: Family Medicine

## 2022-03-04 ENCOUNTER — Other Ambulatory Visit: Payer: Self-pay | Admitting: Interventional Cardiology

## 2022-03-04 MED ORDER — NITROGLYCERIN 0.4 MG SL SUBL: 0.4000 mg | SUBLINGUAL_TABLET | SUBLINGUAL | 0 refills | Status: DC | PRN

## 2022-04-20 ENCOUNTER — Other Ambulatory Visit: Payer: Self-pay | Admitting: Interventional Cardiology

## 2022-04-21 ENCOUNTER — Other Ambulatory Visit: Payer: Self-pay | Admitting: Family Medicine

## 2022-05-06 ENCOUNTER — Encounter: Payer: Self-pay | Admitting: *Deleted

## 2022-05-22 ENCOUNTER — Encounter: Payer: Self-pay | Admitting: Physical Therapy

## 2022-05-22 ENCOUNTER — Ambulatory Visit: Payer: Medicare Other | Attending: Family Medicine | Admitting: Physical Therapy

## 2022-05-22 ENCOUNTER — Other Ambulatory Visit: Payer: Self-pay

## 2022-05-22 DIAGNOSIS — R2689 Other abnormalities of gait and mobility: Secondary | ICD-10-CM | POA: Diagnosis not present

## 2022-05-22 DIAGNOSIS — M6281 Muscle weakness (generalized): Secondary | ICD-10-CM | POA: Diagnosis not present

## 2022-05-22 DIAGNOSIS — R2681 Unsteadiness on feet: Secondary | ICD-10-CM | POA: Insufficient documentation

## 2022-05-22 NOTE — Therapy (Addendum)
OUTPATIENT PHYSICAL THERAPY LOWER EXTREMITY EVALUATION   Patient Name: Joshua [name]. MRN: [id number] DOB:[birth year], 81 y.o., male Today's Date: 05/22/2022   PT End of Session - 05/22/22 1111     Visit Number 1    Number of Visits --   1-2x/week   Date for PT Re-Evaluation 07/17/22    Authorization Type [employer] MCR    PT Start Time 2200    PT Stop Time 2240    PT Time Calculation (min) 40 min             Past Medical History:  Diagnosis Date   Coronary artery disease involving native coronary artery of native heart with angina pectoris ([place])    a. 1990 s/p CABG x 4 (RIMA->LAD, LIMA->LCX, VG->Diag, VG->RCA); b. 2011 s/p BMS to VG->RCA.; c. Unstable Angina 05/2017: Patent RIMA-LAD & LIMA-LCx, ostSVG-RCA 65% ISR & mSVG-RCA  99% - DES PCI to both // Myoview 05/2019:  EF 57, no ischemia or scar, Low Risk    GERD (gastroesophageal reflux disease)    History of echocardiogram    a. 01/2017 Echo: EF 55-60%, no rwma, mild MR, midlly dil LA, PASP 67mHg.   History of hiatal hernia    Hyperlipidemia    Hypertension    Obesity    Past Surgical History:  Procedure Laterality Date   CATARACT EXTRACTION W/ INTRAOCULAR LENS  IMPLANT, BILATERAL Bilateral    CORONARY ANGIOPLASTY WITH STENT PLACEMENT  2011   "RCA"   CORONARY ARTERY BYPASS GRAFT  1990   CABG X4   CORONARY STENT INTERVENTION N/A 06/24/2017   Procedure: Coronary Stent Intervention;  Surgeon: SBelva Crome MD;  [facility];  Service: Cardiovascular: Ostial 4.0 x 12 mm Onyx DES reducing 70% stenosis to less than 40%. Distal body 3.5 x 15 Onyx DES reducing 99% stenosis to less than 30%.   LEFT HEART CATH AND CORS/GRAFTS ANGIOGRAPHY N/A 06/24/2017   Procedure: Left Heart Cath and Cors/Grafts Angiography;  Surgeon: SBelva Crome MD;  [facility];  Service: Cardiovascular:  CTO native RCA, LAD & LCx (Graft Dependent). Patent LIMA-LCx & RIMA-LAD.  SVG-RCA: ost 65% ISR & mid 99% --> DES PCI to both    LEFT HEART CATH AND CORS/GRAFTS ANGIOGRAPHY N/A 01/04/2021   Procedure: LEFT HEART CATH AND CORS/GRAFTS ANGIOGRAPHY;  Surgeon: SBelva Crome MD;  [facility];  Service: Cardiovascular;  Laterality: N/A;   Patient Active Problem List   Diagnosis Date Noted   Balance problems 01/31/2022   Right shoulder pain 01/31/2022   Dysphagia 01/31/2022   Healthcare maintenance 08/22/2021   History of non-ST elevation myocardial infarction (NSTEMI) 06/25/2017   Presence of drug coated stent in SVG-RCA 06/25/2017   Postherpetic neuralgia 01/02/2017   Tremor 05/15/2016   Irritability and anger 05/15/2016   Seborrheic keratoses 06/16/2014   Special screening for malignant neoplasm of prostate 06/14/2014   Retinal hemorrhage 07/23/2010   Impotence of organic origin 01/16/2010   Essential hypertension 04/18/2009   Hyperlipidemia with target low density lipoprotein (LDL) cholesterol less than 70 mg/dL 01/28/2007   CAD (coronary artery disease) 01/28/2007   HERNIA, HIATAL, NONCONGENITAL 01/28/2007    PCP: NDickie La MD  REFERRING PROVIDER: NDickie La MD  THERAPY DIAG:  Balance problems  Muscle weakness  Unsteadiness on feet  Other abnormalities of gait and mobility  REFERRING DIAG: Balance problem  Rationale for Evaluation and Treatment Rehabilitation  SUBJECTIVE:  PERTINENT PAST HISTORY:  Heart  attack with 4 bypass grafts ~1990, 2018, 2022        PRECAUTIONS: Fall  WEIGHT BEARING RESTRICTIONS No  FALLS:  Has patient fallen in last 6 months? Yes, Number of falls: multiple falls over several years.  States that if he is bending forward he cannot return to neutral.  He has tripped over several objects.  MOI/History of condition:  Onset date: ~ year  Joshua [name]. is a 81 y.o. male who presents to clinic with chief complaint of progressively feeling off balance.  He is worried about falling.  He feels his legs are weak.  Denies dizziness.  Denies  injury from falls.   Red flags:  denies   Pain:  Are you having pain? No  Occupation: Retired  Administrator, sports: none used  Journalist, newspaper: NA  Patient Goals/Specific Activities: improve balance   OBJECTIVE:   DIAGNOSTIC FINDINGS:  NA   GENERAL OBSERVATION/GAIT:   Slow gait, wide base of support, small step length   Vitals:  127/60, 02 sat 97, HR 53 (pt states this is typical)  SENSATION:  Light touch: Appears intact  MUSCLE LENGTH: Hamstrings: Right moderate restriction; Left moderate restriction ASLR: Right ASLR = PSLR; Left ASLR = PSLR Thomas test: Right significant restriction; Left significant restriction Ely's test: Right significant restriction; Left significant restriction  LE MMT:  MMT Right 05/22/2022 Left 05/22/2022  Hip flexion (L2, L3) 4 4  Knee extension (L3) 4+ 4+  Knee flexion 4+ 4+  Hip abduction 4 4  Hip extension 3+ 3+  Hip external rotation    Hip internal rotation    Hip adduction    Ankle dorsiflexion (L4) 4 4  Ankle plantarflexion (S1)    Ankle inversion    Ankle eversion    Great Toe ext (L5)    Grossly     (Blank rows = not tested, score listed is out of 5 possible points.  N = WNL, D = diminished, C = clear for gross weakness with myotome testing, * = concordant pain with testing)  Functional Tests  Eval (05/22/2022)    BERG BALANCE TEST Sitting to Standing: 4.      Stands without using hands and stabilize independently Standing Unsupported: 4.      Stands safely for 2 minutes Sitting Unsupported: 4.     Sits for 2 minutes independently Standing to Sitting: 2.     Uses back of legs against chair to control descent  Transfers: 4.     Transfers safely with minor use of hands Standing with eyes closed: 3.     Stands 10 seconds with supervision Standing with feet together: 4.     Stands for 1 minute safely Reaching forward with outstretched arm: 3.     Reaches forward 5 inches Retrieving object from the floor: 3.     Able to  pick up with supervision Turning to look behind: 2.     Turns sideways only, maintains balance Turning 360 degrees: 2.     Able to turn slowly, but safely Place alternate foot on stool: 4.     Completes 8 steps in 20 seconds     Standing with one foot in front: 2.     Independent small step for 30 seconds Standing on one foot: 2.     Holds >/=3 seconds  Total Score: 43/56    Tug: 13''  PATIENT SURVEYS:  ABC scale 75.6%   TODAY'S TREATMENT:    PATIENT EDUCATION:  POC, diagnosis, prognosis, and outcome measures.  Pt educated via explanation, and demonstration.  Pt confirms understanding verbally.   HOME EXERCISE PROGRAM: Next visit  ASTERISK SIGNS   Asterisk Signs Eval (05/22/2022)       Hip flexor tightness Signficant bil       RF tightness Significant bil       Hip extensor strength L/R 3+/3+       Balance Tandem: unable       Tug 13''         ASSESSMENT:  CLINICAL IMPRESSION: Lyal is a 81 y.o. male who presents to clinic with signs and sxs consistent with balance impairments.  He has reasonable strength in his LE's with the exception of hip ext which is significantly limited.  He does have tight hip flexors and RF bil which are contributing to forward flexed truck posture.  This, in combination with weak hip ext, is consistent with his subjective report that he feels like he will fall forward when picking up objects.    OBJECTIVE IMPAIRMENTS: Pain, hip flexor tightness, HS tightness, hip ext strength, balance, gait  ACTIVITY LIMITATIONS: feeling confident navigating stairs or in community  PERSONAL FACTORS: See medical history and pertinent history   REHAB POTENTIAL: Good  CLINICAL DECISION MAKING: Stable/uncomplicated  EVALUATION COMPLEXITY: Low   GOALS:   SHORT TERM GOALS: Target date: 06/12/2022  Mort will be >75% HEP compliant to improve carryover between sessions and facilitate  independent management of condition  Evaluation (05/22/2022): ongoing Goal status: INITIAL   LONG TERM GOALS: Target date: 07/17/2022  Jonothan will improve the following MMTs to >/= 4/5 to show improvement in strength:  hip ext strength   Evaluation/Baseline (05/22/2022): see chart in note Goal status: INITIAL   2.  Doris will show a >/= 15% improvement in their ABC score as a proxy for functional improvement   Evaluation/Baseline (05/22/2022): 75% confident Goal status: INITIAL    3.  Pj will imporve BERG balance score to 50 pts, to show a significant improvement in balance in order to reduce fall risk  Evaluation/Baseline (05/22/2022): 43 pts Goal status: INITIAL  See interpretation below:     4.  Michaelanthony will be able to stand for >15'' in tandem stance, to show a significant improvement in balance in order to reduce fall risk   Evaluation/Baseline (05/22/2022): unable Goal status: INITIAL  5. Miranda will express confidence in navigating 10 steps using reciprocal pattern  Evaluation/Baseline (05/22/2022): 70% confident Goal status: INITIAL     PLAN: PT FREQUENCY: 1-2x/week  PT DURATION: 8 weeks (Ending 07/17/2022)  PLANNED INTERVENTIONS: Therapeutic exercises, Aquatic therapy, Therapeutic activity, Neuro Muscular re-education, Gait training, Patient/Family education, Joint mobilization, Dry Needling, Electrical stimulation, Spinal mobilization and/or manipulation, Moist heat, Taping, Vasopneumatic device, Ionotophoresis '4mg'$ /ml Dexamethasone, and Manual therapy  PLAN FOR NEXT SESSION: progressive balance, hip flexor/RF/HS stretching, hip ext strengthening   Shearon Balo PT, DPT 05/22/2022, 11:31 AM

## 2022-05-26 DIAGNOSIS — L821 Other seborrheic keratosis: Secondary | ICD-10-CM | POA: Diagnosis not present

## 2022-05-26 DIAGNOSIS — D225 Melanocytic nevi of trunk: Secondary | ICD-10-CM | POA: Diagnosis not present

## 2022-05-26 DIAGNOSIS — D1801 Hemangioma of skin and subcutaneous tissue: Secondary | ICD-10-CM | POA: Diagnosis not present

## 2022-05-26 DIAGNOSIS — L570 Actinic keratosis: Secondary | ICD-10-CM | POA: Diagnosis not present

## 2022-05-26 DIAGNOSIS — L814 Other melanin hyperpigmentation: Secondary | ICD-10-CM | POA: Diagnosis not present

## 2022-05-27 ENCOUNTER — Ambulatory Visit: Payer: Medicare Other | Admitting: Physical Therapy

## 2022-05-28 NOTE — Therapy (Signed)
OUTPATIENT PHYSICAL THERAPY TREATMENT NOTE   Patient Name: Joshua [name]. MRN: [id number] DOB:06/26/1941, 81 y.o., male Today's Date: 05/29/2022  PCP: Dickie La, MD REFERRING PROVIDER: Dickie La, MD  END OF SESSION:   PT End of Session - 05/29/22 0931     Visit Number 2    Number of Visits 17   1-2x/week   Date for PT Re-Evaluation 07/17/22    Authorization Type UHC MCR    PT Start Time 0931    PT Stop Time 1013    PT Time Calculation (min) 42 min    Equipment Utilized During Treatment Gait belt    Activity Tolerance Patient tolerated treatment well    Behavior During Therapy WFL for tasks assessed/performed             Past Medical History:  Diagnosis Date   Coronary artery disease involving native coronary artery of native heart with angina pectoris ([place])    a. 1990 s/p CABG x 4 (RIMA->LAD, LIMA->LCX, VG->Diag, VG->RCA); b. 2011 s/p BMS to VG->RCA.; c. Unstable Angina 05/2017: Patent RIMA-LAD & LIMA-LCx, ostSVG-RCA 65% ISR & mSVG-RCA  99% - DES PCI to both // Myoview 05/2019:  EF 57, no ischemia or scar, Low Risk    GERD (gastroesophageal reflux disease)    History of echocardiogram    a. 01/2017 Echo: EF 55-60%, no rwma, mild MR, midlly dil LA, PASP 62mHg.   History of hiatal hernia    Hyperlipidemia    Hypertension    Obesity    Past Surgical History:  Procedure Laterality Date   CATARACT EXTRACTION W/ INTRAOCULAR LENS  IMPLANT, BILATERAL Bilateral    CORONARY ANGIOPLASTY WITH STENT PLACEMENT  2011   "RCA"   CORONARY ARTERY BYPASS GRAFT  1990   CABG X4   CORONARY STENT INTERVENTION N/A 06/24/2017   Procedure: Coronary Stent Intervention;  Surgeon: SBelva Crome MD;  [facility];  Service: Cardiovascular: Ostial 4.0 x 12 mm Onyx DES reducing 70% stenosis to less than 40%. Distal body 3.5 x 15 Onyx DES reducing 99% stenosis to less than 30%.   LEFT HEART CATH AND CORS/GRAFTS ANGIOGRAPHY N/A 06/24/2017   Procedure: Left Heart Cath and  Cors/Grafts Angiography;  Surgeon: SBelva Crome MD;  [facility];  Service: Cardiovascular:  CTO native RCA, LAD & LCx (Graft Dependent). Patent LIMA-LCx & RIMA-LAD.  SVG-RCA: ost 65% ISR & mid 99% --> DES PCI to both   LEFT HEART CATH AND CORS/GRAFTS ANGIOGRAPHY N/A 01/04/2021   Procedure: LEFT HEART CATH AND CORS/GRAFTS ANGIOGRAPHY;  Surgeon: SBelva Crome MD;  [facility];  Service: Cardiovascular;  Laterality: N/A;   Patient Active Problem List   Diagnosis Date Noted   Balance problems 01/31/2022   Right shoulder pain 01/31/2022   Dysphagia 01/31/2022   Healthcare maintenance 08/22/2021   History of non-ST elevation myocardial infarction (NSTEMI) 06/25/2017   Presence of drug coated stent in SVG-RCA 06/25/2017   Postherpetic neuralgia 01/02/2017   Tremor 05/15/2016   Irritability and anger 05/15/2016   Seborrheic keratoses 06/16/2014   Special screening for malignant neoplasm of prostate 06/14/2014   Retinal hemorrhage 07/23/2010   Impotence of organic origin 01/16/2010   Essential hypertension 04/18/2009   Hyperlipidemia with target low density lipoprotein (LDL) cholesterol less than 70 mg/dL 01/28/2007   CAD (coronary artery disease) 01/28/2007   HERNIA, HIATAL, NONCONGENITAL 01/28/2007    REFERRING DIAG: Balance problem  THERAPY DIAG:  Balance  problems  Muscle weakness  Unsteadiness on feet  Other abnormalities of gait and mobility  Rationale for Evaluation and Treatment Rehabilitation  PERTINENT HISTORY: Heart attack with 4 bypass grafts ~1990, 2018, 2022    PRECAUTIONS: fall   SUBJECTIVE: Patient reports he is doing well without reports of pain. No falls since last session.   PAIN:  Are you having pain? No   OBJECTIVE: (objective measures completed at initial evaluation unless otherwise dated) OBJECTIVE:    DIAGNOSTIC FINDINGS:  NA             GENERAL OBSERVATION/GAIT:                     Slow gait, wide base of  support, small step length             Vitals:           127/60, 02 sat 97, HR 53 (pt states this is typical)   SENSATION:          Light touch: Appears intact   MUSCLE LENGTH: Hamstrings: Right moderate restriction; Left moderate restriction ASLR: Right ASLR = PSLR; Left ASLR = PSLR Thomas test: Right significant restriction; Left significant restriction Ely's test: Right significant restriction; Left significant restriction   LE MMT:   MMT Right 05/22/2022 Left 05/22/2022  Hip flexion (L2, L3) 4 4  Knee extension (L3) 4+ 4+  Knee flexion 4+ 4+  Hip abduction 4 4  Hip extension 3+ 3+  Hip external rotation      Hip internal rotation      Hip adduction      Ankle dorsiflexion (L4) 4 4  Ankle plantarflexion (S1)      Ankle inversion      Ankle eversion      Great Toe ext (L5)      Grossly        (Blank rows = not tested, score listed is out of 5 possible points.  N = WNL, D = diminished, C = clear for gross weakness with myotome testing, * = concordant pain with testing)   Functional Tests   Eval (05/22/2022)      BERG BALANCE TEST Sitting to Standing: 4.      Stands without using hands and stabilize independently Standing Unsupported: 4.      Stands safely for 2 minutes Sitting Unsupported: 4.     Sits for 2 minutes independently Standing to Sitting: 2.     Uses back of legs against chair to control descent  Transfers: 4.     Transfers safely with minor use of hands Standing with eyes closed: 3.     Stands 10 seconds with supervision Standing with feet together: 4.     Stands for 1 minute safely Reaching forward with outstretched arm: 3.     Reaches forward 5 inches Retrieving object from the floor: 3.     Able to pick up with supervision Turning to look behind: 2.     Turns sideways only, maintains balance Turning 360 degrees: 2.     Able to turn slowly, but safely Place alternate foot on stool: 4.     Completes 8 steps in 20 seconds     Standing with one foot in  front: 2.     Independent small step for 30 seconds Standing on one foot: 2.     Holds >/=3 seconds   Total Score: 43/56      Tug: 13''  PATIENT SURVEYS:  ABC scale 75.6%     TODAY'S TREATMENT:  [facility] Adult PT Treatment:                                                DATE: 05/29/22 Therapeutic Exercise: Supine hip flexor stretch x 60 seconds each Seated hamstring stretch x 60 seconds each  Hip bridge 2 x 10  Resisted abduction in hooklying 2 x 10; green band  Sit to stand 2 x 10  Standing hip extension 2 x 10 each  Standing march 2 x 10   Neuromuscular re-ed: Romberg EO  x 30 seconds; EC 2 x 30 seconds  Semi-tandem EO x 30 seconds; EC 2 x 30 seconds        PATIENT EDUCATION:  Education details: HEP Person educated: Patient Education method: Consulting civil engineer, Media planner, and Handouts Education comprehension: returned demo, verbalized understanding   HOME EXERCISE PROGRAM:   Access Code: [email or web address] URL: [email or web address] Date: 05/29/2022 Prepared by: Gwendolyn Grant  Exercises - Modified Thomas Stretch  - 1 x daily - 7 x weekly - 2 sets - 1 min hold - Seated Hamstring Stretch  - 1 x daily - 7 x weekly - 2 sets - 68mn hold - Supine Bridge  - 1 x daily - 7 x weekly - 2 sets - 10 reps - Hooklying Clamshell with Resistance  - 1 x daily - 7 x weekly - 2 sets - 10 reps - Sit to Stand  - 1 x daily - 7 x weekly - 2 sets - 10 reps - Standing Hip Extension with Counter Support  - 1 x daily - 7 x weekly - 2 sets - 10 reps - Semi-Tandem Balance at [employer] Open  - 1 x daily - 7 x weekly - 3 sets - 30 sec  hold ASTERISK SIGNS     Asterisk Signs Eval (05/22/2022)            Hip flexor tightness Signficant bil            RF tightness Significant bil            Hip extensor strength L/R 3+/3+            Balance Tandem: unable            Tug 13''                 ASSESSMENT:   CLINICAL IMPRESSION: Patient tolerated session well today focusing on hip strengthening and static balance activity. He has occasional LOB with romberg and semi-tandem with EC requiring assistance to regain balance on 2 occasions. Demonstrates good postural stability during romberg and semi-tandem with eyes open with minimal postural sway noted. He has difficulty maintaining balance during standing activity when LLE is stance leg.    OBJECTIVE IMPAIRMENTS: Pain, hip flexor tightness, HS tightness, hip ext strength, balance, gait   ACTIVITY LIMITATIONS: feeling confident navigating stairs or in community   PERSONAL FACTORS: See medical history and pertinent history     REHAB POTENTIAL: Good   CLINICAL DECISION MAKING: Stable/uncomplicated   EVALUATION COMPLEXITY: Low     GOALS:     SHORT TERM GOALS: Target date: 06/12/2022   Kuzey will be >75% HEP compliant to improve carryover between sessions and facilitate independent management of condition   Evaluation (05/22/2022): ongoing Goal status: INITIAL  LONG TERM GOALS: Target date: 07/17/2022   Tierre will improve the following MMTs to >/= 4/5 to show improvement in strength:  hip ext strength    Evaluation/Baseline (05/22/2022): see chart in note Goal status: INITIAL     2.  Lory will show a >/= 15% improvement in their ABC score as a proxy for functional improvement    Evaluation/Baseline (05/22/2022): 75% confident Goal status: INITIAL       3.  Aylen will imporve BERG balance score to 50 pts, to show a significant improvement in balance in order to reduce fall risk   Evaluation/Baseline (05/22/2022): 43 pts Goal status: INITIAL   See interpretation below:       4.  Jorell will be able to stand for >15'' in tandem stance, to show a significant improvement in balance in order to reduce fall risk    Evaluation/Baseline (05/22/2022): unable Goal status: INITIAL   5. Senai will express  confidence in navigating 10 steps using reciprocal pattern   Evaluation/Baseline (05/22/2022): 70% confident Goal status: INITIAL         PLAN: PT FREQUENCY: 1-2x/week   PT DURATION: 8 weeks (Ending 07/17/2022)   PLANNED INTERVENTIONS: Therapeutic exercises, Aquatic therapy, Therapeutic activity, Neuro Muscular re-education, Gait training, Patient/Family education, Joint mobilization, Dry Needling, Electrical stimulation, Spinal mobilization and/or manipulation, Moist heat, Taping, Vasopneumatic device, Ionotophoresis '4mg'$ /ml Dexamethasone, and Manual therapy   PLAN FOR NEXT SESSION: progressive balance, hip flexor/RF/HS stretching, hip ext strengthening Gwendolyn Grant, PT, DPT, ATC 05/29/22 10:14 AM

## 2022-05-29 ENCOUNTER — Ambulatory Visit: Payer: Medicare Other

## 2022-05-29 DIAGNOSIS — R2681 Unsteadiness on feet: Secondary | ICD-10-CM | POA: Diagnosis not present

## 2022-05-29 DIAGNOSIS — R2689 Other abnormalities of gait and mobility: Secondary | ICD-10-CM | POA: Diagnosis not present

## 2022-05-29 DIAGNOSIS — M6281 Muscle weakness (generalized): Secondary | ICD-10-CM | POA: Diagnosis not present

## 2022-06-01 ENCOUNTER — Other Ambulatory Visit: Payer: Self-pay | Admitting: Interventional Cardiology

## 2022-06-02 ENCOUNTER — Ambulatory Visit: Payer: Medicare Other | Attending: Family Medicine

## 2022-06-02 DIAGNOSIS — M6281 Muscle weakness (generalized): Secondary | ICD-10-CM | POA: Diagnosis not present

## 2022-06-02 DIAGNOSIS — R2681 Unsteadiness on feet: Secondary | ICD-10-CM | POA: Insufficient documentation

## 2022-06-02 DIAGNOSIS — R2689 Other abnormalities of gait and mobility: Secondary | ICD-10-CM | POA: Insufficient documentation

## 2022-06-02 NOTE — Therapy (Signed)
OUTPATIENT PHYSICAL THERAPY TREATMENT NOTE   Patient Name: Joshua [name]. MRN: [id number] DOB:10-May-1941, 81 y.o., male Today's Date: 06/02/2022  PCP: Dickie La, MD REFERRING PROVIDER: Dickie La, MD  END OF SESSION:   PT End of Session - 06/02/22 0925     Visit Number 3    Number of Visits 17    Date for PT Re-Evaluation 07/17/22    Authorization Type U[employer] MCR    PT Start Time 0930    PT Stop Time 1013    PT Time Calculation (min) 43 min    Equipment Utilized During Treatment --    Activity Tolerance Patient tolerated treatment well    Behavior During Therapy WFL for tasks assessed/performed              Past Medical History:  Diagnosis Date   Coronary artery disease involving native coronary artery of native heart with angina pectoris ([place])    a. 1990 s/p CABG x 4 (RIMA->LAD, LIMA->LCX, VG->Diag, VG->RCA); b. 2011 s/p BMS to VG->RCA.; c. Unstable Angina 05/2017: Patent RIMA-LAD & LIMA-LCx, ostSVG-RCA 65% ISR & mSVG-RCA  99% - DES PCI to both // Myoview 05/2019:  EF 57, no ischemia or scar, Low Risk    GERD (gastroesophageal reflux disease)    History of echocardiogram    a. 01/2017 Echo: EF 55-60%, no rwma, mild MR, midlly dil LA, PASP 55mHg.   History of hiatal hernia    Hyperlipidemia    Hypertension    Obesity    Past Surgical History:  Procedure Laterality Date   CATARACT EXTRACTION W/ INTRAOCULAR LENS  IMPLANT, BILATERAL Bilateral    CORONARY ANGIOPLASTY WITH STENT PLACEMENT  2011   "RCA"   CORONARY ARTERY BYPASS GRAFT  1990   CABG X4   CORONARY STENT INTERVENTION N/A 06/24/2017   Procedure: Coronary Stent Intervention;  Surgeon: SBelva Crome MD;  [facility];  Service: Cardiovascular: Ostial 4.0 x 12 mm Onyx DES reducing 70% stenosis to less than 40%. Distal body 3.5 x 15 Onyx DES reducing 99% stenosis to less than 30%.   LEFT HEART CATH AND CORS/GRAFTS ANGIOGRAPHY N/A 06/24/2017   Procedure: Left Heart Cath and Cors/Grafts  Angiography;  Surgeon: SBelva Crome MD;  Location: [facility];  Service: Cardiovascular:  CTO native RCA, LAD & LCx (Graft Dependent). Patent LIMA-LCx & RIMA-LAD.  SVG-RCA: ost 65% ISR & mid 99% --> DES PCI to both   LEFT HEART CATH AND CORS/GRAFTS ANGIOGRAPHY N/A 01/04/2021   Procedure: LEFT HEART CATH AND CORS/GRAFTS ANGIOGRAPHY;  Surgeon: SBelva Crome MD;  [facility];  Service: Cardiovascular;  Laterality: N/A;   Patient Active Problem List   Diagnosis Date Noted   Balance problems 01/31/2022   Right shoulder pain 01/31/2022   Dysphagia 01/31/2022   Healthcare maintenance 08/22/2021   History of non-ST elevation myocardial infarction (NSTEMI) 06/25/2017   Presence of drug coated stent in SVG-RCA 06/25/2017   Postherpetic neuralgia 01/02/2017   Tremor 05/15/2016   Irritability and anger 05/15/2016   Seborrheic keratoses 06/16/2014   Special screening for malignant neoplasm of prostate 06/14/2014   Retinal hemorrhage 07/23/2010   Impotence of organic origin 01/16/2010   Essential hypertension 04/18/2009   Hyperlipidemia with target low density lipoprotein (LDL) cholesterol less than 70 mg/dL 01/28/2007   CAD (coronary artery disease) 01/28/2007   HERNIA, HIATAL, NONCONGENITAL 01/28/2007    REFERRING DIAG: Balance problem  THERAPY DIAG:  Balance problems  Muscle weakness  Unsteadiness on feet  Other abnormalities of gait and mobility  Rationale for Evaluation and Treatment Rehabilitation  PERTINENT HISTORY: Heart attack with 4 bypass grafts ~1990, 2018, 2022    PRECAUTIONS: fall   SUBJECTIVE: Patient reports HEP compliance, no falls since last session.   PAIN:  Are you having pain? No   OBJECTIVE: (objective measures completed at initial evaluation unless otherwise dated) OBJECTIVE:    DIAGNOSTIC FINDINGS:  NA             GENERAL OBSERVATION/GAIT:                     Slow gait, wide base of support, small step length              Vitals:           127/60, 02 sat 97, HR 53 (pt states this is typical)   SENSATION:          Light touch: Appears intact   MUSCLE LENGTH: Hamstrings: Right moderate restriction; Left moderate restriction ASLR: Right ASLR = PSLR; Left ASLR = PSLR Thomas test: Right significant restriction; Left significant restriction Ely's test: Right significant restriction; Left significant restriction   LE MMT:   MMT Right 05/22/2022 Left 05/22/2022  Hip flexion (L2, L3) 4 4  Knee extension (L3) 4+ 4+  Knee flexion 4+ 4+  Hip abduction 4 4  Hip extension 3+ 3+  Hip external rotation      Hip internal rotation      Hip adduction      Ankle dorsiflexion (L4) 4 4  Ankle plantarflexion (S1)      Ankle inversion      Ankle eversion      Great Toe ext (L5)      Grossly        (Blank rows = not tested, score listed is out of 5 possible points.  N = WNL, D = diminished, C = clear for gross weakness with myotome testing, * = concordant pain with testing)   Functional Tests   Eval (05/22/2022)      BERG BALANCE TEST Sitting to Standing: 4.      Stands without using hands and stabilize independently Standing Unsupported: 4.      Stands safely for 2 minutes Sitting Unsupported: 4.     Sits for 2 minutes independently Standing to Sitting: 2.     Uses back of legs against chair to control descent  Transfers: 4.     Transfers safely with minor use of hands Standing with eyes closed: 3.     Stands 10 seconds with supervision Standing with feet together: 4.     Stands for 1 minute safely Reaching forward with outstretched arm: 3.     Reaches forward 5 inches Retrieving object from the floor: 3.     Able to pick up with supervision Turning to look behind: 2.     Turns sideways only, maintains balance Turning 360 degrees: 2.     Able to turn slowly, but safely Place alternate foot on stool: 4.     Completes 8 steps in 20 seconds     Standing with one foot in front: 2.     Independent small step for 30  seconds Standing on one foot: 2.     Holds >/=3 seconds   Total Score: 43/56      Tug: 13''  PATIENT SURVEYS:  ABC scale 75.6%     TODAY'S TREATMENT: [facility] Adult PT Treatment:                                                DATE: 06/02/2022 Therapeutic Exercise: Nustep level 5 x 5 mins Supine hip flexor stretch x 60 seconds each Seated hamstring stretch x 60 seconds each  Hip bridge 2 x 10  Resisted abduction in hooklying 3 x 10; green band  Sit to stand 2 x 10  Standing hip extension 2 x 10 each  Standing heel/toe raise 3x10 each Standing hip abduction 2x10 each  Standing march 2 x 10  Neuromuscular re-ed: Romberg EO with head turns x 30 seconds; EC  x 30 seconds  Semi-tandem EO with head turns x 30 seconds; EC 2 x 30 seconds  Romberg on airex EO 2x30"    [facility] Adult PT Treatment:                                                DATE: 05/29/22 Therapeutic Exercise: Supine hip flexor stretch x 60 seconds each Seated hamstring stretch x 60 seconds each  Hip bridge 2 x 10  Resisted abduction in hooklying 2 x 10; green band  Sit to stand 2 x 10  Standing hip extension 2 x 10 each  Standing march 2 x 10   Neuromuscular re-ed: Romberg EO  x 30 seconds; EC 2 x 30 seconds  Semi-tandem EO x 30 seconds; EC 2 x 30 seconds        PATIENT EDUCATION:  Education details: HEP Person educated: Patient Education method: Consulting civil engineer, Media planner, and Handouts Education comprehension: returned demo, verbalized understanding   HOME EXERCISE PROGRAM:   Access Code: [email or web address] URL: [email or web address] Date: 05/29/2022 Prepared by: Gwendolyn Grant  Exercises - Modified Thomas Stretch  - 1 x daily - 7 x weekly - 2 sets - 1 min hold - Seated Hamstring Stretch  - 1 x daily - 7 x weekly - 2 sets - 51mn hold - Supine Bridge  - 1 x daily - 7 x weekly - 2 sets - 10 reps -  Hooklying Clamshell with Resistance  - 1 x daily - 7 x weekly - 2 sets - 10 reps - Sit to Stand  - 1 x daily - 7 x weekly - 2 sets - 10 reps - Standing Hip Extension with Counter Support  - 1 x daily - 7 x weekly - 2 sets - 10 reps - Semi-Tandem Balance at [employer]yes Open  - 1 x daily - 7 x weekly - 3 sets - 30 sec  hold ASTERISK SIGNS     Asterisk Signs Eval (05/22/2022)            Hip flexor tightness Signficant bil            RF tightness Significant bil            Hip extensor strength L/R 3+/3+            Balance Tandem: unable            Tug 13''                ASSESSMENT:  CLINICAL IMPRESSION: Patient presents to PT reporting daily HEP compliance. Session today continued to focus on proximal hip strengthening and stretching as well as balance tasks. Increased repetitions of exercises today and added more exercises and balance tasks with good tolerance. Introduced compliant surface static balance training today with no LOB, he exhibits the most difficulty with semi-tandem eyes closed and had 1 instance of LOB requiring help to recover. Continue to advanced balance training and introduce dynamic balance tasks as able. Patient continues to benefit from skilled PT services and should be progressed as able to improve functional independence.  OBJECTIVE IMPAIRMENTS: Pain, hip flexor tightness, HS tightness, hip ext strength, balance, gait   ACTIVITY LIMITATIONS: feeling confident navigating stairs or in community   PERSONAL FACTORS: See medical history and pertinent history     REHAB POTENTIAL: Good   CLINICAL DECISION MAKING: Stable/uncomplicated   EVALUATION COMPLEXITY: Low     GOALS:     SHORT TERM GOALS: Target date: 06/12/2022   Demetres will be >75% HEP compliant to improve carryover between sessions and facilitate independent management of condition   Evaluation (05/22/2022): ongoing Goal status: INITIAL     LONG TERM GOALS: Target date: 07/17/2022   Taren  will improve the following MMTs to >/= 4/5 to show improvement in strength:  hip ext strength    Evaluation/Baseline (05/22/2022): see chart in note Goal status: INITIAL     2.  Reyden will show a >/= 15% improvement in their ABC score as a proxy for functional improvement    Evaluation/Baseline (05/22/2022): 75% confident Goal status: INITIAL       3.  Eldin will imporve BERG balance score to 50 pts, to show a significant improvement in balance in order to reduce fall risk   Evaluation/Baseline (05/22/2022): 43 pts Goal status: INITIAL   See interpretation below:       4.  Yosiah will be able to stand for >15'' in tandem stance, to show a significant improvement in balance in order to reduce fall risk    Evaluation/Baseline (05/22/2022): unable Goal status: INITIAL   5. Abdallah will express confidence in navigating 10 steps using reciprocal pattern   Evaluation/Baseline (05/22/2022): 70% confident Goal status: INITIAL         PLAN: PT FREQUENCY: 1-2x/week   PT DURATION: 8 weeks (Ending 07/17/2022)   PLANNED INTERVENTIONS: Therapeutic exercises, Aquatic therapy, Therapeutic activity, Neuro Muscular re-education, Gait training, Patient/Family education, Joint mobilization, Dry Needling, Electrical stimulation, Spinal mobilization and/or manipulation, Moist heat, Taping, Vasopneumatic device, Ionotophoresis '4mg'$ /ml Dexamethasone, and Manual therapy   PLAN FOR NEXT SESSION: progressive balance, hip flexor/RF/HS stretching, hip ext strengthening   Evelene Croon, PTA 06/02/22 10:16 AM

## 2022-06-03 NOTE — Progress Notes (Signed)
Cardiology Office Note:    Date:  06/04/2022   ID:  Joshua [name]., DOB 12/11/1940, MRN [id number]  PCP:  Joshua Ramp, MD  Cardiologist:  Joshua Noe, MD   Referring MD: Joshua Ramp, MD   Chief Complaint  Patient presents with   Shortness of Breath   Coronary Artery Disease   Hypertension    History of Present Illness:    Joshua [name]. is a 81 y.o. male with a hx of coronary artery disease status post bypass surgery in 1990 and subsequent PCI to the SVG-RCA with a bare-metal stent in 2011.  He was admitted in July 2018 with non-ST elevation myocardial infarction and underwent PCI with a DES x2 to the SVG-RCA. Seen Feb. 2019 with angina and noted to have anemia. Cath February 2022 for chest pain unchanged.   He seems to be slowing down.  He is having difficulty with balance.  He is now in rehab trying to improve his muscle and core strength.  Had 1 episode of sharp chest pain for which she use nitroglycerin but cannot remember exactly when this was.  His wife cannot remember either.  His major differentiating complaint today is significant dyspnea on exertion.  He does not have orthopnea.  No significant lower extremity swelling or chest pain associated with exertion.  An echo done 18 months ago demonstrated left ventricular hypertrophy, pulmonary hypertension, but "normal diastolic function".  Past Medical History:  Diagnosis Date   Coronary artery disease involving native coronary artery of native heart with angina pectoris (HCC)    a. 1990 s/p CABG x 4 (RIMA->LAD, LIMA->LCX, VG->Diag, VG->RCA); b. 2011 s/p BMS to VG->RCA.; c. Unstable Angina 05/2017: Patent RIMA-LAD & LIMA-LCx, ostSVG-RCA 65% ISR & mSVG-RCA  99% - DES PCI to both // Myoview 05/2019:  EF 57, no ischemia or scar, Low Risk    GERD (gastroesophageal reflux disease)    History of echocardiogram    a. 01/2017 Echo: EF 55-60%, no rwma, mild MR, midlly dil LA, PASP .   History of hiatal hernia     Hyperlipidemia    Hypertension    Obesity     Past Surgical History:  Procedure Laterality Date   CATARACT EXTRACTION W/ INTRAOCULAR LENS  IMPLANT, BILATERAL Bilateral    CORONARY ANGIOPLASTY WITH STENT PLACEMENT  2011   "RCA"   CORONARY ARTERY BYPASS GRAFT  1990   CABG X4   CORONARY STENT INTERVENTION N/A 06/24/2017   Procedure: Coronary Stent Intervention;  Surgeon: Joshua Records, MD;  Location: [facility];  Service: Cardiovascular: Ostial 4.0 x 12 mm Onyx DES reducing 70% stenosis to less than 40%. Distal body 3.5 x 15 Onyx DES reducing 99% stenosis to less than 30%.   LEFT HEART CATH AND CORS/GRAFTS ANGIOGRAPHY N/A 06/24/2017   Procedure: Left Heart Cath and Cors/Grafts Angiography;  Surgeon: Joshua Records, MD;  Location: [facility];  Service: Cardiovascular:  CTO native RCA, LAD & LCx (Graft Dependent). Patent LIMA-LCx & RIMA-LAD.  SVG-RCA: ost 65% ISR & mid 99% --> DES PCI to both   LEFT HEART CATH AND CORS/GRAFTS ANGIOGRAPHY N/A 01/04/2021   Procedure: LEFT HEART CATH AND CORS/GRAFTS ANGIOGRAPHY;  Surgeon: Joshua Records, MD;  Location: [facility];  Service: Cardiovascular;  Laterality: N/A;    Current Medications: Current Meds  Medication Sig   ALPRAZolam (XANAX) 0.5 MG tablet TAKE 1 TABLET BY MOUTH AT  BEDTIME AS NEEDED FOR  RESTLESS  LEGS   aspirin EC 81 MG EC tablet Take 1 tablet (81 mg total) by mouth daily.   atenolol-chlorthalidone (TENORETIC) 100-25 MG tablet TAKE 1 TABLET BY MOUTH  DAILY   citalopram (CELEXA) 20 MG tablet TAKE 1 TABLET BY MOUTH  DAILY   esomeprazole (NEXIUM) 20 MG capsule Take 1 capsule (20 mg total) by mouth daily.   ezetimibe (ZETIA) 10 MG tablet TAKE 1 TABLET BY MOUTH  DAILY   gabapentin (NEURONTIN) 300 MG capsule Take 300 mg by mouth 3 (three) times daily. 2 tab in the morning, 1 at noon, and 2 at bedtime   isosorbide mononitrate (IMDUR) 120 MG 24 hr tablet TAKE 1 TABLET BY MOUTH DAILY   simvastatin (ZOCOR) 40 MG tablet TAKE 1  TABLET BY MOUTH  DAILY   [DISCONTINUED] nitroGLYCERIN (NITROSTAT) 0.4 MG SL tablet Place 1 tablet (0.4 mg total) under the tongue every 5 (five) minutes as needed for chest pain. Please call [phone] to schedule an appointment for future refills. Thank you. 1st attempt.     Allergies:   Ace inhibitors, Atorvastatin, Rosuvastatin, Plavix [clopidogrel bisulfate], Sulfa antibiotics, and Sulfonamide derivatives   Social History   Socioeconomic History   Marital status: Married    Spouse name: Not on file   Number of children: Not on file   Years of education: Not on file   Highest education level: Not on file  Occupational History   Occupation: retired    Comment: manual labor (truck driver, heating and air)  Tobacco Use   Smoking status: Never   Smokeless tobacco: Former    Types: Chew    Quit date: 2000  Vaping Use   Vaping Use: Never used  Substance and Sexual Activity   Alcohol use: Yes    Alcohol/week: 0.0 standard drinks of alcohol    Comment: 06/23/2017 "a few drinks/ month   Drug use: No   Sexual activity: Not Currently  Other Topics Concern   Not on file  Social History Narrative   Lives locally with his wife Joshua [name] - former [employer] ECG tech).  Does not routinely exercise.   Social Determinants of Health   Financial Resource Strain: Not on file  Food Insecurity: Not on file  Transportation Needs: Not on file  Physical Activity: Not on file  Stress: Not on file  Social Connections: Not on file     Family History: The patient's family history includes Cancer in his mother; Coronary artery disease in his father; Heart attack in his father; Hyperlipidemia in his son; Hypertension in his son.  ROS:   Please see the history of present illness.    Decreased memory.  Difficulty ambulating.  1 recent fall.  If he walks too far too fast he gets aching in his chest.  Rest relieves it all other systems reviewed and are negative.  EKGs/Labs/Other Studies Reviewed:    The  following studies were reviewed today: 2 D doppler Echocardiogram 2022: IMPRESSIONS    1. Left ventricular ejection fraction, by estimation, is 55 to 60%. The  left ventricle has normal function. The left ventricle has no regional  wall motion abnormalities. There is moderate left ventricular hypertrophy.  Left ventricular diastolic  parameters were normal.   2. Right ventricular systolic function is mildly reduced. The right  ventricular size is normal. There is mildly elevated pulmonary artery  systolic pressure. The estimated right ventricular systolic pressure is  40.5 mmHg.   3. Right atrial size was mildly dilated.  4. The mitral valve is normal in structure. Trivial mitral valve  regurgitation. No evidence of mitral stenosis.   5. The aortic valve is abnormal. There is moderate calcification of the  aortic valve. Aortic valve regurgitation is not visualized. Mild aortic  valve sclerosis is present, with no evidence of aortic valve stenosis.   6. The inferior vena cava is dilated in size with >50% respiratory  variability, suggesting right atrial pressure of 8 mmHg.    CARDIAC CATH 2022: Diagnostic Dominance: Co-dominant  EKG:  EKG NSR/sinus bradycardia with RBBB.  No changes noted compared to 2022.  Recent Labs: 08/21/2021: ALT 19; BUN 13; Creatinine, Ser 0.98; Potassium 4.3; Sodium 137  Recent Lipid Panel    Component Value Date/Time   CHOL 151 01/05/2021 0214   CHOL 136 10/06/2017 0740   TRIG 294 (H) 01/05/2021 0214   HDL 25 (L) 01/05/2021 0214   HDL 27 (L) 10/06/2017 0740   CHOLHDL 6.0 01/05/2021 0214   VLDL 59 (H) 01/05/2021 0214   LDLCALC 67 01/05/2021 0214   LDLCALC 79 10/06/2017 0740   LDLDIRECT 87 05/30/2020 1231   LDLDIRECT 84 05/14/2016 1234    Physical Exam:    VS:  BP 126/62   Pulse (!) 55   Ht 5\' 8"  (1.727 m)   Wt 232 lb (105.2 kg)   SpO2 97%   BMI 35.28 kg/m     Wt Readings from Last 3 Encounters:  06/04/22 232 lb (105.2 kg)  01/31/22  220 lb (99.8 kg)  08/21/21 228 lb 3.2 oz (103.5 kg)     GEN: Obese with BMI 35. No acute distress HEENT: Normal NECK: No JVD. LYMPHATICS: No lymphadenopathy CARDIAC: No murmur. RRR S4 but no S3 gallop, or edema. VASCULAR:  Normal Pulses. No bruits. RESPIRATORY:  Clear to auscultation without rales, wheezing or rhonchi  ABDOMEN: Soft, non-tender, non-distended, No pulsatile mass, MUSCULOSKELETAL: No deformity  SKIN: Warm and dry NEUROLOGIC:  Alert and oriented x 3 PSYCHIATRIC:  Normal affect   ASSESSMENT:    1. Coronary artery disease involving native coronary artery of native heart with angina pectoris (HCC)   2. Essential hypertension   3. Hyperlipidemia, unspecified hyperlipidemia type   4. Type 2 diabetes mellitus with complication, without long-term current use of insulin (HCC)   5. Shortness of breath   6. Right bundle branch block    PLAN:    In order of problems listed above:  Secondary prevention reviewed.  Updated prescription for nitro given.  Encouraged aerobic activity.  Blood pressure is excellently controlled.  Continue Tenoretic and Imdur. Continue Zocor 40 mg/day. Consider SGLT2 therapy especially if echo and BNP are trending in a direction that suggest diastolic heart failure. Suspect dyspnea on exertion could be related to diastolic heart failure.  We will repeat echo, check BNP, and if appropriate start SGLT2 therapy. No changes noted compared to February 2022.   Follow-up will be dependent upon work-up.   Medication Adjustments/Labs and Tests Ordered: Current medicines are reviewed at length with the patient today.  Concerns regarding medicines are outlined above.  Orders Placed This Encounter  Procedures   Pro b natriuretic peptide (BNP)   EKG 12-Lead   ECHOCARDIOGRAM COMPLETE   Meds ordered this encounter  Medications   nitroGLYCERIN (NITROSTAT) 0.4 MG SL tablet    Sig: Place 1 tablet (0.4 mg total) under the tongue every 5 (five) minutes as  needed for chest pain.    Dispense:  25 tablet    Refill:  11    Requesting 1 year supply    Patient Instructions  Medication Instructions:  Your physician recommends that you continue on your current medications as directed. Please refer to the Current Medication list given to you today.  *If you need a refill on your cardiac medications before your next appointment, please call your pharmacy*  Lab Work: Same day as echocardiogram: BNP  Testing/Procedures: Your physician has requested that you have an echocardiogram. Echocardiography is a painless test that uses sound waves to create images of your heart. It provides your doctor with information about the size and shape of your heart and how well your heart's chambers and valves are working. This procedure takes approximately one hour. There are no restrictions for this procedure.  Follow-Up: At [facility], you and your health needs are our priority.  As part of our continuing mission to provide you with exceptional heart care, we have created designated Provider Care Teams.  These Care Teams include your primary Cardiologist (physician) and Advanced Practice Providers (APPs -  Physician Assistants and Nurse Practitioners) who all work together to provide you with the care you need, when you need it.  Your next appointment:   1 year(s)  The format for your next appointment:   In Person  Provider:   Lesleigh Noe, MD {   Important Information About Sugar         Signed, Joshua Noe, MD  06/04/2022 5:10 PM    [facility]

## 2022-06-04 ENCOUNTER — Encounter: Payer: Self-pay | Admitting: Interventional Cardiology

## 2022-06-04 ENCOUNTER — Ambulatory Visit: Payer: Medicare Other | Admitting: Interventional Cardiology

## 2022-06-04 VITALS — BP 126/62 | HR 55 | Ht 68.0 in | Wt 232.0 lb

## 2022-06-04 DIAGNOSIS — I25119 Atherosclerotic heart disease of native coronary artery with unspecified angina pectoris: Secondary | ICD-10-CM

## 2022-06-04 DIAGNOSIS — E118 Type 2 diabetes mellitus with unspecified complications: Secondary | ICD-10-CM | POA: Diagnosis not present

## 2022-06-04 DIAGNOSIS — I10 Essential (primary) hypertension: Secondary | ICD-10-CM | POA: Diagnosis not present

## 2022-06-04 DIAGNOSIS — R0602 Shortness of breath: Secondary | ICD-10-CM | POA: Diagnosis not present

## 2022-06-04 DIAGNOSIS — I4510 Unspecified right bundle-branch block: Secondary | ICD-10-CM | POA: Diagnosis not present

## 2022-06-04 DIAGNOSIS — E785 Hyperlipidemia, unspecified: Secondary | ICD-10-CM | POA: Diagnosis not present

## 2022-06-04 MED ORDER — NITROGLYCERIN 0.4 MG SL SUBL: 0.4000 mg | SUBLINGUAL_TABLET | SUBLINGUAL | 11 refills | Status: DC | PRN

## 2022-06-04 NOTE — Patient Instructions (Signed)
Medication Instructions:  Your physician recommends that you continue on your current medications as directed. Please refer to the Current Medication list given to you today.  *If you need a refill on your cardiac medications before your next appointment, please call your pharmacy*  Lab Work: Same day as echocardiogram: BNP  Testing/Procedures: Your physician has requested that you have an echocardiogram. Echocardiography is a painless test that uses sound waves to create images of your heart. It provides your doctor with information about the size and shape of your heart and how well your heart's chambers and valves are working. This procedure takes approximately one hour. There are no restrictions for this procedure.  Follow-Up: At [facility], you and your health needs are our priority.  As part of our continuing mission to provide you with exceptional heart care, we have created designated Provider Care Teams.  These Care Teams include your primary Cardiologist (physician) and Advanced Practice Providers (APPs -  Physician Assistants and Nurse Practitioners) who all work together to provide you with the care you need, when you need it.  Your next appointment:   1 year(s)  The format for your next appointment:   In Person  Provider:   Sinclair Grooms, MD {   Important Information About Sugar

## 2022-06-05 ENCOUNTER — Encounter: Payer: Self-pay | Admitting: Physical Therapy

## 2022-06-05 ENCOUNTER — Ambulatory Visit: Payer: Medicare Other | Admitting: Physical Therapy

## 2022-06-05 DIAGNOSIS — R2681 Unsteadiness on feet: Secondary | ICD-10-CM | POA: Diagnosis not present

## 2022-06-05 DIAGNOSIS — R2689 Other abnormalities of gait and mobility: Secondary | ICD-10-CM | POA: Diagnosis not present

## 2022-06-05 DIAGNOSIS — M6281 Muscle weakness (generalized): Secondary | ICD-10-CM | POA: Diagnosis not present

## 2022-06-05 NOTE — Therapy (Signed)
OUTPATIENT PHYSICAL THERAPY TREATMENT NOTE   Patient Name: Joshua [name]. MRN: [id number] DOB:Jan 24, 1941, 81 y.o., male Today's Date: 06/05/2022  PCP: Dickie La, MD REFERRING PROVIDER: Dickie La, MD  END OF SESSION:   PT End of Session - 06/05/22 0928     Visit Number 4    Number of Visits 17    Date for PT Re-Evaluation 07/17/22    Authorization Type UHC MCR    PT Start Time 0930    PT Stop Time 4098    PT Time Calculation (min) 45 min              Past Medical History:  Diagnosis Date   Coronary artery disease involving native coronary artery of native heart with angina pectoris ([place])    a. 1990 s/p CABG x 4 (RIMA->LAD, LIMA->LCX, VG->Diag, VG->RCA); b. 2011 s/p BMS to VG->RCA.; c. Unstable Angina 05/2017: Patent RIMA-LAD & LIMA-LCx, ostSVG-RCA 65% ISR & mSVG-RCA  99% - DES PCI to both // Myoview 05/2019:  EF 57, no ischemia or scar, Low Risk    GERD (gastroesophageal reflux disease)    History of echocardiogram    a. 01/2017 Echo: EF 55-60%, no rwma, mild MR, midlly dil LA, PASP 14mmHg.   History of hiatal hernia    Hyperlipidemia    Hypertension    Obesity    Past Surgical History:  Procedure Laterality Date   CATARACT EXTRACTION W/ INTRAOCULAR LENS  IMPLANT, BILATERAL Bilateral    CORONARY ANGIOPLASTY WITH STENT PLACEMENT  2011   "RCA"   CORONARY ARTERY BYPASS GRAFT  1990   CABG X4   CORONARY STENT INTERVENTION N/A 06/24/2017   Procedure: Coronary Stent Intervention;  Surgeon: Belva Crome, MD;  Location: [facility];  Service: Cardiovascular: Ostial 4.0 x 12 mm Onyx DES reducing 70% stenosis to less than 40%. Distal body 3.5 x 15 Onyx DES reducing 99% stenosis to less than 30%.   LEFT HEART CATH AND CORS/GRAFTS ANGIOGRAPHY N/A 06/24/2017   Procedure: Left Heart Cath and Cors/Grafts Angiography;  Surgeon: Belva Crome, MD;  Location: [facility];  Service: Cardiovascular:  CTO native RCA, LAD & LCx (Graft Dependent). Patent LIMA-LCx &  RIMA-LAD.  SVG-RCA: ost 65% ISR & mid 99% --> DES PCI to both   LEFT HEART CATH AND CORS/GRAFTS ANGIOGRAPHY N/A 01/04/2021   Procedure: LEFT HEART CATH AND CORS/GRAFTS ANGIOGRAPHY;  Surgeon: Belva Crome, MD;  Location: [facility];  Service: Cardiovascular;  Laterality: N/A;   Patient Active Problem List   Diagnosis Date Noted   Balance problems 01/31/2022   Right shoulder pain 01/31/2022   Dysphagia 01/31/2022   Healthcare maintenance 08/22/2021   History of non-ST elevation myocardial infarction (NSTEMI) 06/25/2017   Presence of drug coated stent in SVG-RCA 06/25/2017   Postherpetic neuralgia 01/02/2017   Tremor 05/15/2016   Irritability and anger 05/15/2016   Seborrheic keratoses 06/16/2014   Special screening for malignant neoplasm of prostate 06/14/2014   Retinal hemorrhage 07/23/2010   Impotence of organic origin 01/16/2010   Essential hypertension 04/18/2009   Hyperlipidemia with target low density lipoprotein (LDL) cholesterol less than 70 mg/dL 01/28/2007   CAD (coronary artery disease) 01/28/2007   HERNIA, HIATAL, NONCONGENITAL 01/28/2007    REFERRING DIAG: Balance problem  THERAPY DIAG:  Balance problems  Muscle weakness  Unsteadiness on feet  Other abnormalities of gait and mobility  Rationale for Evaluation and Treatment Rehabilitation  PERTINENT HISTORY: Heart attack with 4  bypass grafts ~1990, 2018, 2022    PRECAUTIONS: fall   SUBJECTIVE:  No falls. I can tell a little improvement in my flexibility. Pt's wife reports patient is stiff in hips and cannot reach his toes. She would like him to work on flexibility, not just balance.  PAIN:  Are you having pain? No   OBJECTIVE: (objective measures completed at initial evaluation unless otherwise dated)    DIAGNOSTIC FINDINGS:  NA             GENERAL OBSERVATION/GAIT:                     Slow gait, wide base of support, small step length             Vitals:           127/60, 02 sat 97, HR 53  (pt states this is typical)   SENSATION:          Light touch: Appears intact   MUSCLE LENGTH: Hamstrings: Right moderate restriction; Left moderate restriction ASLR: Right ASLR = PSLR; Left ASLR = PSLR Thomas test: Right significant restriction; Left significant restriction Ely's test: Right significant restriction; Left significant restriction   LE MMT:   MMT Right 05/22/2022 Left 05/22/2022  Hip flexion (L2, L3) 4 4  Knee extension (L3) 4+ 4+  Knee flexion 4+ 4+  Hip abduction 4 4  Hip extension 3+ 3+  Hip external rotation      Hip internal rotation      Hip adduction      Ankle dorsiflexion (L4) 4 4  Ankle plantarflexion (S1)      Ankle inversion      Ankle eversion      Great Toe ext (L5)      Grossly        (Blank rows = not tested, score listed is out of 5 possible points.  N = WNL, D = diminished, C = clear for gross weakness with myotome testing, * = concordant pain with testing)   Functional Tests   Eval (05/22/2022)      BERG BALANCE TEST Sitting to Standing: 4.      Stands without using hands and stabilize independently Standing Unsupported: 4.      Stands safely for 2 minutes Sitting Unsupported: 4.     Sits for 2 minutes independently Standing to Sitting: 2.     Uses back of legs against chair to control descent  Transfers: 4.     Transfers safely with minor use of hands Standing with eyes closed: 3.     Stands 10 seconds with supervision Standing with feet together: 4.     Stands for 1 minute safely Reaching forward with outstretched arm: 3.     Reaches forward 5 inches Retrieving object from the floor: 3.     Able to pick up with supervision Turning to look behind: 2.     Turns sideways only, maintains balance Turning 360 degrees: 2.     Able to turn slowly, but safely Place alternate foot on stool: 4.     Completes 8 steps in 20 seconds     Standing with one foot in front: 2.     Independent small step for 30 seconds Standing on one foot: 2.     Holds  >/=3 seconds   Total Score: 43/56      Tug: 13''  PATIENT SURVEYS:  ABC scale 75.6%     TODAY'S TREATMENT: [facility] Adult PT Treatment:                                                DATE: 06/05/2022 Therapeutic Exercise: Nustep level 5 x 5 mins Free motion Deep squats x 10  Standing heel/toe raise 3x10 each Standing march 2 x 10,  3# Standing hip extension 2 x 10 each , 3# Standing hip abduction 2x10 each , 3# Slant board stretch x 30 sec  Sit to stand 2 x 10 Seated lumbar flexion stretch reaching to toes Supine hip flexor stretch x 60 seconds each Seated hamstring stretch 30 sec x 2 each  seconds each   Neuromuscular re-ed: A/P weight shifting on blue rocker board x 1 minute progressing to no UE assist  Romberg on airex EO 2x30", added trunk rotations Tandem on airex 3 x 30 sec   [facility] Adult PT Treatment:                                                DATE: 06/02/2022 Therapeutic Exercise: Nustep level 5 x 5 mins Supine hip flexor stretch x 60 seconds each Seated hamstring stretch x 60 seconds each  Hip bridge 2 x 10  Resisted abduction in hooklying 3 x 10; green band  Sit to stand 2 x 10  Standing hip extension 2 x 10 each  Standing heel/toe raise 3x10 each Standing hip abduction 2x10 each  Standing march 2 x 10  Neuromuscular re-ed: Romberg EO with head turns x 30 seconds; EC  x 30 seconds  Semi-tandem EO with head turns x 30 seconds; EC 2 x 30 seconds  Romberg on airex EO 2x30"    [facility] Adult PT Treatment:                                                DATE: 05/29/22 Therapeutic Exercise: Supine hip flexor stretch x 60 seconds each Seated hamstring stretch x 60 seconds each  Hip bridge 2 x 10  Resisted abduction in hooklying 2 x 10; green band  Sit to stand 2 x 10  Standing hip extension 2 x 10 each  Standing march 2 x 10   Neuromuscular re-ed: Romberg EO  x 30  seconds; EC 2 x 30 seconds  Semi-tandem EO x 30 seconds; EC 2 x 30 seconds        PATIENT EDUCATION:  Education details: HEP Person educated: Patient Education method: Consulting civil engineer, Media planner, and Handouts Education comprehension: returned demo, verbalized understanding   HOME EXERCISE PROGRAM:   Access Code: [email or web address] URL: [email or web address] Date: 05/29/2022 Prepared by: Gwendolyn Grant  Exercises - Modified Thomas Stretch  - 1 x daily - 7 x weekly - 2 sets - 1 min hold - Seated Hamstring Stretch  - 1 x daily - 7 x weekly - 2 sets - 30mn hold - Supine Bridge  - 1 x daily - 7 x weekly - 2 sets - 10 reps - Hooklying Clamshell with Resistance  - 1 x daily - 7 x weekly -  2 sets - 10 reps - Sit to Stand  - 1 x daily - 7 x weekly - 2 sets - 10 reps - Standing Hip Extension with Counter Support  - 1 x daily - 7 x weekly - 2 sets - 10 reps - Semi-Tandem Balance at [employer] Eyes Open  - 1 x daily - 7 x weekly - 3 sets - 30 sec  hold ASTERISK SIGNS     Asterisk Signs Eval (05/22/2022)            Hip flexor tightness Signficant bil            RF tightness Significant bil            Hip extensor strength L/R 3+/3+            Balance Tandem: unable            Tug 13''                ASSESSMENT:   CLINICAL IMPRESSION: Patient presents to PT reporting daily HEP compliance. STG#1 Met.  Pt's wife voices concern with patient's hip flexibility and wants to make sure he is working on that as well as his balance. Continued to focus on proximal hip strengthening and stretching as well as balance tasks. Added resistance to repetitions of exercises today and increased balance tasks with good tolerance. Educated pt on med mobility including sidelying to supine and sidelying to sit. Continue to advanced balance training and introduce dynamic balance tasks as able. Patient continues to benefit from skilled PT services and should be progressed as able to improve functional  independence.  OBJECTIVE IMPAIRMENTS: Pain, hip flexor tightness, HS tightness, hip ext strength, balance, gait   ACTIVITY LIMITATIONS: feeling confident navigating stairs or in community   PERSONAL FACTORS: See medical history and pertinent history     REHAB POTENTIAL: Good   CLINICAL DECISION MAKING: Stable/uncomplicated   EVALUATION COMPLEXITY: Low     GOALS:     SHORT TERM GOALS: Target date: 06/12/2022   Joshua [name] will be >75% HEP compliant to improve carryover between sessions and facilitate independent management of condition   Evaluation (05/22/2022): ongoing Goal status: MET     LONG TERM GOALS: Target date: 07/17/2022   Joshua [name] will improve the following MMTs to >/= 4/5 to show improvement in strength:  hip ext strength    Evaluation/Baseline (05/22/2022): see chart in note Goal status: INITIAL     2.  Joshua [name] will show a >/= 15% improvement in their ABC score as a proxy for functional improvement    Evaluation/Baseline (05/22/2022): 75% confident Goal status: INITIAL       3.  Joshua [name] will imporve BERG balance score to 50 pts, to show a significant improvement in balance in order to reduce fall risk   Evaluation/Baseline (05/22/2022): 43 pts Goal status: INITIAL   See interpretation below:       4.  Joshua [name] will be able to stand for >15'' in tandem stance, to show a significant improvement in balance in order to reduce fall risk    Evaluation/Baseline (05/22/2022): unable Goal status: INITIAL   5. Joshua [name] will express confidence in navigating 10 steps using reciprocal pattern   Evaluation/Baseline (05/22/2022): 70% confident Goal status: INITIAL         PLAN: PT FREQUENCY: 1-2x/week   PT DURATION: 8 weeks (Ending 07/17/2022)   PLANNED INTERVENTIONS: Therapeutic exercises, Aquatic therapy, Therapeutic activity, Neuro Muscular re-education, Gait training, Patient/Family education, Joint mobilization, Dry Needling,  Electrical stimulation, Spinal  mobilization and/or manipulation, Moist heat, Taping, Vasopneumatic device, Ionotophoresis 64m/ml Dexamethasone, and Manual therapy   PLAN FOR NEXT SESSION: progressive balance, hip flexor/RF/HS stretching, hip ext strengthening, pt's wife is concerned about his hip flexibility    JHessie Diener PTA 06/05/22 12:09 PM Phone: [phone]: [phone]

## 2022-06-13 ENCOUNTER — Ambulatory Visit: Payer: Medicare Other

## 2022-06-13 DIAGNOSIS — M6281 Muscle weakness (generalized): Secondary | ICD-10-CM

## 2022-06-13 DIAGNOSIS — R2689 Other abnormalities of gait and mobility: Secondary | ICD-10-CM

## 2022-06-13 DIAGNOSIS — R2681 Unsteadiness on feet: Secondary | ICD-10-CM

## 2022-06-13 NOTE — Therapy (Signed)
OUTPATIENT PHYSICAL THERAPY TREATMENT NOTE   Patient Name: Joshua [name]. MRN: [id number] DOB:09-Oct-1941, 81 y.o., male Today's Date: 06/13/2022  PCP: Dickie La, MD REFERRING PROVIDER: Dickie La, MD  END OF SESSION:   PT End of Session - 06/13/22 1028     Visit Number 5    Number of Visits 17    Date for PT Re-Evaluation 07/17/22    Authorization Type UHC MCR    PT Start Time 1028   patient late   PT Stop Time 1108    PT Time Calculation (min) 40 min    Activity Tolerance Patient tolerated treatment well    Behavior During Therapy WFL for tasks assessed/performed              Past Medical History:  Diagnosis Date   Coronary artery disease involving native coronary artery of native heart with angina pectoris ([place])    a. 1990 s/p CABG x 4 (RIMA->LAD, LIMA->LCX, VG->Diag, VG->RCA); b. 2011 s/p BMS to VG->RCA.; c. Unstable Angina 05/2017: Patent RIMA-LAD & LIMA-LCx, ostSVG-RCA 65% ISR & mSVG-RCA  99% - DES PCI to both // Myoview 05/2019:  EF 57, no ischemia or scar, Low Risk    GERD (gastroesophageal reflux disease)    History of echocardiogram    a. 01/2017 Echo: EF 55-60%, no rwma, mild MR, midlly dil LA, PASP 59mHg.   History of hiatal hernia    Hyperlipidemia    Hypertension    Obesity    Past Surgical History:  Procedure Laterality Date   CATARACT EXTRACTION W/ INTRAOCULAR LENS  IMPLANT, BILATERAL Bilateral    CORONARY ANGIOPLASTY WITH STENT PLACEMENT  2011   "RCA"   CORONARY ARTERY BYPASS GRAFT  1990   CABG X4   CORONARY STENT INTERVENTION N/A 06/24/2017   Procedure: Coronary Stent Intervention;  Surgeon: SBelva Crome MD;  [facility];  Service: Cardiovascular: Ostial 4.0 x 12 mm Onyx DES reducing 70% stenosis to less than 40%. Distal body 3.5 x 15 Onyx DES reducing 99% stenosis to less than 30%.   LEFT HEART CATH AND CORS/GRAFTS ANGIOGRAPHY N/A 06/24/2017   Procedure: Left Heart Cath and Cors/Grafts Angiography;  Surgeon: SBelva Crome MD;  [facility];  Service: Cardiovascular:  CTO native RCA, LAD & LCx (Graft Dependent). Patent LIMA-LCx & RIMA-LAD.  SVG-RCA: ost 65% ISR & mid 99% --> DES PCI to both   LEFT HEART CATH AND CORS/GRAFTS ANGIOGRAPHY N/A 01/04/2021   Procedure: LEFT HEART CATH AND CORS/GRAFTS ANGIOGRAPHY;  Surgeon: SBelva Crome MD;  [facility];  Service: Cardiovascular;  Laterality: N/A;   Patient Active Problem List   Diagnosis Date Noted   Balance problems 01/31/2022   Right shoulder pain 01/31/2022   Dysphagia 01/31/2022   Healthcare maintenance 08/22/2021   History of non-ST elevation myocardial infarction (NSTEMI) 06/25/2017   Presence of drug coated stent in SVG-RCA 06/25/2017   Postherpetic neuralgia 01/02/2017   Tremor 05/15/2016   Irritability and anger 05/15/2016   Seborrheic keratoses 06/16/2014   Special screening for malignant neoplasm of prostate 06/14/2014   Retinal hemorrhage 07/23/2010   Impotence of organic origin 01/16/2010   Essential hypertension 04/18/2009   Hyperlipidemia with target low density lipoprotein (LDL) cholesterol less than 70 mg/dL 01/28/2007   CAD (coronary artery disease) 01/28/2007   HERNIA, HIATAL, NONCONGENITAL 01/28/2007    REFERRING DIAG: Balance problem  THERAPY DIAG:  Balance problems  Muscle weakness  Unsteadiness on  feet  Other abnormalities of gait and mobility  Rationale for Evaluation and Treatment Rehabilitation  PERTINENT HISTORY: Heart attack with 4 bypass grafts ~1990, 2018, 2022    PRECAUTIONS: fall   SUBJECTIVE:  Patient reports he is doing well without reports of pain. He reports compliance with HEP. No falls.  PAIN:  Are you having pain? No   OBJECTIVE: (objective measures completed at initial evaluation unless otherwise dated)    DIAGNOSTIC FINDINGS:  NA             GENERAL OBSERVATION/GAIT:                     Slow gait, wide base of support, small step length             Vitals:            127/60, 02 sat 97, HR 53 (pt states this is typical)   SENSATION:          Light touch: Appears intact   MUSCLE LENGTH: Hamstrings: Right moderate restriction; Left moderate restriction ASLR: Right ASLR = PSLR; Left ASLR = PSLR Thomas test: Right significant restriction; Left significant restriction Ely's test: Right significant restriction; Left significant restriction   LE MMT:   MMT Right 05/22/2022 Left 05/22/2022  Hip flexion (L2, L3) 4 4  Knee extension (L3) 4+ 4+  Knee flexion 4+ 4+  Hip abduction 4 4  Hip extension 3+ 3+  Hip external rotation      Hip internal rotation      Hip adduction      Ankle dorsiflexion (L4) 4 4  Ankle plantarflexion (S1)      Ankle inversion      Ankle eversion      Great Toe ext (L5)      Grossly        (Blank rows = not tested, score listed is out of 5 possible points.  N = WNL, D = diminished, C = clear for gross weakness with myotome testing, * = concordant pain with testing)   Functional Tests   Eval (05/22/2022)      BERG BALANCE TEST Sitting to Standing: 4.      Stands without using hands and stabilize independently Standing Unsupported: 4.      Stands safely for 2 minutes Sitting Unsupported: 4.     Sits for 2 minutes independently Standing to Sitting: 2.     Uses back of legs against chair to control descent  Transfers: 4.     Transfers safely with minor use of hands Standing with eyes closed: 3.     Stands 10 seconds with supervision Standing with feet together: 4.     Stands for 1 minute safely Reaching forward with outstretched arm: 3.     Reaches forward 5 inches Retrieving object from the floor: 3.     Able to pick up with supervision Turning to look behind: 2.     Turns sideways only, maintains balance Turning 360 degrees: 2.     Able to turn slowly, but safely Place alternate foot on stool: 4.     Completes 8 steps in 20 seconds     Standing with one foot in front: 2.     Independent small step for 30  seconds Standing on one foot: 2.     Holds >/=3 seconds   Total Score: 43/56      Tug: 13''  PATIENT SURVEYS:  ABC scale 75.6%     TODAY'S TREATMENT: [facility] Adult PT Treatment:                                                DATE: 06/13/22 Therapeutic Exercise: NuStep level 6 x 5 minutes  Prone quad stretch with strap x 60 second each  Figure 4 stretch x 60 seconds each  Sit to stand with overhead press yellow med ball 2 x 10   Therapeutic Activity: Hurdle step overs in // bars forward step to and reciprocal stepping 3 trials down and back each Hurdle step overs in // bars lateral 3 trials down and back Step ups in // bars 1 x 10 each forward and lateral 4 inch    [facility] Adult PT Treatment:                                                DATE: 06/05/2022 Therapeutic Exercise: Nustep level 5 x 5 mins Free motion Deep squats x 10  Standing heel/toe raise 3x10 each Standing march 2 x 10,  3# Standing hip extension 2 x 10 each , 3# Standing hip abduction 2x10 each , 3# Slant board stretch x 30 sec  Sit to stand 2 x 10 Seated lumbar flexion stretch reaching to toes Supine hip flexor stretch x 60 seconds each Seated hamstring stretch 30 sec x 2 each  seconds each   Neuromuscular re-ed: A/P weight shifting on blue rocker board x 1 minute progressing to no UE assist  Romberg on airex EO 2x30", added trunk rotations Tandem on airex 3 x 30 sec   [facility] Adult PT Treatment:                                                DATE: 06/02/2022 Therapeutic Exercise: Nustep level 5 x 5 mins Supine hip flexor stretch x 60 seconds each Seated hamstring stretch x 60 seconds each  Hip bridge 2 x 10  Resisted abduction in hooklying 3 x 10; green band  Sit to stand 2 x 10  Standing hip extension 2 x 10 each  Standing heel/toe raise 3x10 each Standing hip abduction 2x10 each  Standing march 2 x 10   Neuromuscular re-ed: Romberg EO with head turns x 30 seconds; EC  x 30 seconds  Semi-tandem EO with head turns x 30 seconds; EC 2 x 30 seconds  Romberg on airex EO 2x30"         PATIENT EDUCATION:  Education details: N/A Person educated: N/A Education method: N/A Education comprehension: N/A   HOME EXERCISE PROGRAM:   Access Code: [email or web address] URL: [email or web address] Date: 05/29/2022 Prepared by: Gwendolyn Grant  Exercises - Modified Thomas Stretch  - 1 x daily - 7 x weekly - 2 sets - 1 min hold - Seated Hamstring Stretch  - 1 x daily - 7 x weekly - 2 sets - 32mn hold - Supine Bridge  - 1 x daily - 7 x weekly - 2 sets - 10 reps - Hooklying Clamshell with Resistance  - 1 x daily - 7  x weekly - 2 sets - 10 reps - Sit to Stand  - 1 x daily - 7 x weekly - 2 sets - 10 reps - Standing Hip Extension with Counter Support  - 1 x daily - 7 x weekly - 2 sets - 10 reps - Semi-Tandem Balance at [employer] Eyes Open  - 1 x daily - 7 x weekly - 3 sets - 30 sec  hold ASTERISK SIGNS     Asterisk Signs Eval (05/22/2022)            Hip flexor tightness Signficant bil            RF tightness Significant bil            Hip extensor strength L/R 3+/3+            Balance Tandem: unable            Tug 13''                ASSESSMENT:   CLINICAL IMPRESSION: Patient tolerated session well today focusing on improving hip mobility and strength as well as progressing dynamic balance/gait stability with stepping activity. He has good stability when performing step to hurdle step overs, though has difficulty maintaining balance when completing reciprocal hurdle step overs with occasional LOB requiring therapist assistance. He is more hesitant with lateral stepping activity, though demonstrates good stability with lateral hurdle step overs. With lateral step ups he does experience occasional LOB requiring therapist assistance to keep from falling. No reports of pain throughout session with  patient reporting fatigue at conclusion.   OBJECTIVE IMPAIRMENTS: Pain, hip flexor tightness, HS tightness, hip ext strength, balance, gait   ACTIVITY LIMITATIONS: feeling confident navigating stairs or in community   PERSONAL FACTORS: See medical history and pertinent history     REHAB POTENTIAL: Good   CLINICAL DECISION MAKING: Stable/uncomplicated   EVALUATION COMPLEXITY: Low     GOALS:     SHORT TERM GOALS: Target date: 06/12/2022   Hon[place] will be >75% HEP compliant to improve carryover between sessions and facilitate independent management of condition   Evaluation (05/22/2022): ongoing Goal status: MET     LONG TERM GOALS: Target date: 07/17/2022   Prithvi will improve the following MMTs to >/= 4/5 to show improvement in strength:  hip ext strength    Evaluation/Baseline (05/22/2022): see chart in note Goal status: INITIAL     2.  Isami will show a >/= 15% improvement in their ABC score as a proxy for functional improvement    Evaluation/Baseline (05/22/2022): 75% confident Goal status: INITIAL       3.  Cadarius will imporve BERG balance score to 50 pts, to show a significant improvement in balance in order to reduce fall risk   Evaluation/Baseline (05/22/2022): 43 pts Goal status: INITIAL   See interpretation below:       4.  [place] will be able to stand for >15'' in tandem stance, to show a significant improvement in balance in order to reduce fall risk    Evaluation/Baseline (05/22/2022): unable Goal status: INITIAL   5. [place] will express confidence in navigating 10 steps using reciprocal pattern   Evaluation/Baseline (05/22/2022): 70% confident Goal status: INITIAL         PLAN: PT FREQUENCY: 1-2x/week   PT DURATION: 8 weeks (Ending 07/17/2022)   PLANNED INTERVENTIONS: Therapeutic exercises, Aquatic therapy, Therapeutic activity, Neuro Muscular re-education, Gait training, Patient/Family education, Joint mobilization, Dry Needling, Electrical  stimulation, Spinal mobilization and/or  manipulation, Moist heat, Taping, Vasopneumatic device, Ionotophoresis 81m/ml Dexamethasone, and Manual therapy   PLAN FOR NEXT SESSION: progressive balance, hip flexor/RF/HS stretching, hip ext strengthening, pt's wife is concerned about his hip flexibility   SGwendolyn Grant PT, DPT, ATC 06/13/22 11:12 AM

## 2022-06-18 ENCOUNTER — Ambulatory Visit: Payer: Medicare Other | Admitting: Physical Therapy

## 2022-06-18 ENCOUNTER — Encounter: Payer: Self-pay | Admitting: Physical Therapy

## 2022-06-18 DIAGNOSIS — M6281 Muscle weakness (generalized): Secondary | ICD-10-CM | POA: Diagnosis not present

## 2022-06-18 DIAGNOSIS — R2681 Unsteadiness on feet: Secondary | ICD-10-CM

## 2022-06-18 DIAGNOSIS — R2689 Other abnormalities of gait and mobility: Secondary | ICD-10-CM | POA: Diagnosis not present

## 2022-06-18 NOTE — Therapy (Signed)
OUTPATIENT PHYSICAL THERAPY TREATMENT NOTE   Patient Name: Joshua [name]. MRN: [id number] DOB:Apr 07, 1941, 81 y.o., male Today's Date: 06/18/2022  PCP: Dickie La, MD REFERRING PROVIDER: Dickie La, MD  END OF SESSION:   PT End of Session - 06/18/22 1045     Visit Number 6    Number of Visits 17    Date for PT Re-Evaluation 07/17/22    Authorization Type [employer] MCR    PT Start Time 0156    PT Stop Time 1127    PT Time Calculation (min) 42 min    Activity Tolerance Patient tolerated treatment well    Behavior During Therapy WFL for tasks assessed/performed              Past Medical History:  Diagnosis Date   Coronary artery disease involving native coronary artery of native heart with angina pectoris ([place])    a. 1990 s/p CABG x 4 (RIMA->LAD, LIMA->LCX, VG->Diag, VG->RCA); b. 2011 s/p BMS to VG->RCA.; c. Unstable Angina 05/2017: Patent RIMA-LAD & LIMA-LCx, ostSVG-RCA 65% ISR & mSVG-RCA  99% - DES PCI to both // Myoview 05/2019:  EF 57, no ischemia or scar, Low Risk    GERD (gastroesophageal reflux disease)    History of echocardiogram    a. 01/2017 Echo: EF 55-60%, no rwma, mild MR, midlly dil LA, PASP 62mmHg.   History of hiatal hernia    Hyperlipidemia    Hypertension    Obesity    Past Surgical History:  Procedure Laterality Date   CATARACT EXTRACTION W/ INTRAOCULAR LENS  IMPLANT, BILATERAL Bilateral    CORONARY ANGIOPLASTY WITH STENT PLACEMENT  2011   "RCA"   CORONARY ARTERY BYPASS GRAFT  1990   CABG X4   CORONARY STENT INTERVENTION N/A 06/24/2017   Procedure: Coronary Stent Intervention;  Surgeon: Belva Crome, MD;  Location: [facility];  Service: Cardiovascular: Ostial 4.0 x 12 mm Onyx DES reducing 70% stenosis to less than 40%. Distal body 3.5 x 15 Onyx DES reducing 99% stenosis to less than 30%.   LEFT HEART CATH AND CORS/GRAFTS ANGIOGRAPHY N/A 06/24/2017   Procedure: Left Heart Cath and Cors/Grafts Angiography;  Surgeon: Belva Crome, MD;   Location: [facility];  Service: Cardiovascular:  CTO native RCA, LAD & LCx (Graft Dependent). Patent LIMA-LCx & RIMA-LAD.  SVG-RCA: ost 65% ISR & mid 99% --> DES PCI to both   LEFT HEART CATH AND CORS/GRAFTS ANGIOGRAPHY N/A 01/04/2021   Procedure: LEFT HEART CATH AND CORS/GRAFTS ANGIOGRAPHY;  Surgeon: Belva Crome, MD;  Location: [facility];  Service: Cardiovascular;  Laterality: N/A;   Patient Active Problem List   Diagnosis Date Noted   Balance problems 01/31/2022   Right shoulder pain 01/31/2022   Dysphagia 01/31/2022   Healthcare maintenance 08/22/2021   History of non-ST elevation myocardial infarction (NSTEMI) 06/25/2017   Presence of drug coated stent in SVG-RCA 06/25/2017   Postherpetic neuralgia 01/02/2017   Tremor 05/15/2016   Irritability and anger 05/15/2016   Seborrheic keratoses 06/16/2014   Special screening for malignant neoplasm of prostate 06/14/2014   Retinal hemorrhage 07/23/2010   Impotence of organic origin 01/16/2010   Essential hypertension 04/18/2009   Hyperlipidemia with target low density lipoprotein (LDL) cholesterol less than 70 mg/dL 01/28/2007   CAD (coronary artery disease) 01/28/2007   HERNIA, HIATAL, NONCONGENITAL 01/28/2007    REFERRING DIAG: Balance problem  THERAPY DIAG:  Balance problems  Muscle weakness  Unsteadiness on feet  Other  abnormalities of gait and mobility  Rationale for Evaluation and Treatment Rehabilitation  PERTINENT HISTORY: Heart attack with 4 bypass grafts ~1990, 2018, 2022    PRECAUTIONS: fall   SUBJECTIVE:  Pt reports that he feels like his balance is somewhat improved. PAIN:  Are you having pain? No   OBJECTIVE: (objective measures completed at initial evaluation unless otherwise dated)    DIAGNOSTIC FINDINGS:  NA             GENERAL OBSERVATION/GAIT:                     Slow gait, wide base of support, small step length             Vitals:           127/60, 02 sat 97, HR 53 (pt states  this is typical)   SENSATION:          Light touch: Appears intact   MUSCLE LENGTH: Hamstrings: Right moderate restriction; Left moderate restriction ASLR: Right ASLR = PSLR; Left ASLR = PSLR Thomas test: Right significant restriction; Left significant restriction Ely's test: Right significant restriction; Left significant restriction   LE MMT:   MMT Right 05/22/2022 Left 05/22/2022  Hip flexion (L2, L3) 4 4  Knee extension (L3) 4+ 4+  Knee flexion 4+ 4+  Hip abduction 4 4  Hip extension 3+ 3+  Hip external rotation      Hip internal rotation      Hip adduction      Ankle dorsiflexion (L4) 4 4  Ankle plantarflexion (S1)      Ankle inversion      Ankle eversion      Great Toe ext (L5)      Grossly        (Blank rows = not tested, score listed is out of 5 possible points.  N = WNL, D = diminished, C = clear for gross weakness with myotome testing, * = concordant pain with testing)   Functional Tests   Eval (05/22/2022)      BERG BALANCE TEST Sitting to Standing: 4.      Stands without using hands and stabilize independently Standing Unsupported: 4.      Stands safely for 2 minutes Sitting Unsupported: 4.     Sits for 2 minutes independently Standing to Sitting: 2.     Uses back of legs against chair to control descent  Transfers: 4.     Transfers safely with minor use of hands Standing with eyes closed: 3.     Stands 10 seconds with supervision Standing with feet together: 4.     Stands for 1 minute safely Reaching forward with outstretched arm: 3.     Reaches forward 5 inches Retrieving object from the floor: 3.     Able to pick up with supervision Turning to look behind: 2.     Turns sideways only, maintains balance Turning 360 degrees: 2.     Able to turn slowly, but safely Place alternate foot on stool: 4.     Completes 8 steps in 20 seconds     Standing with one foot in front: 2.     Independent small step for 30 seconds Standing on one foot: 2.     Holds >/=3  seconds   Total Score: 43/56      Tug: 13''  PATIENT SURVEYS:  ABC scale 75.6%  HOME EXERCISE PROGRAM:   Access Code: [email or web address] URL: [email or web address] Date: 05/29/2022 Prepared by: Needham  - 1 x daily - 7 x weekly - 2 sets - 1 min hold - Seated Hamstring Stretch  - 1 x daily - 7 x weekly - 2 sets - 41min hold - Supine Bridge  - 1 x daily - 7 x weekly - 2 sets - 10 reps - Hooklying Clamshell with Resistance  - 1 x daily - 7 x weekly - 2 sets - 10 reps - Sit to Stand  - 1 x daily - 7 x weekly - 2 sets - 10 reps - Standing Hip Extension with Counter Support  - 1 x daily - 7 x weekly - 2 sets - 10 reps - Semi-Tandem Balance at [employer] Eyes Open  - 1 x daily - 7 x weekly - 3 sets - 30 sec  hold ASTERISK SIGNS     Asterisk Signs Eval (05/22/2022) 7/19           Hip flexor tightness Signficant bil            RF tightness Significant bil            Hip extensor strength L/R 3+/3+            Balance Tandem: unable Tandem on foam for 45''           Tug 13''                  TODAY'S TREATMENT:  [facility] Adult PT Treatment:                                                DATE: 06/18/22 Therapeutic Exercise: NuStep level 8 x 5 minutes  Prone quad stretch with strap x 60 second each (NT) Figure 4 stretch x 60 seconds each (NT) Sit to stand with overhead press yellow med ball 2 x 10  Hurdle step overs in // bars forward step to and reciprocal stepping 3 trials down and back each Hurdle step overs in // bars lateral 3 trials down and back Step ups 2 x 10 each forward and lateral 4 inch   NRE: Semi tandem on foam - 45'' bouts Tandem on foam - 45'' bouts Wooden rocker board  - 2' - DF/PF  [facility] Adult PT Treatment:                                                DATE: 06/13/22 Therapeutic Exercise: NuStep level 6 x 5 minutes   Prone quad stretch with strap x 60 second each  Figure 4 stretch x 60 seconds each  Sit to stand with overhead press yellow med ball 2 x 10   Therapeutic Activity: Hurdle step overs in // bars forward step to and reciprocal stepping 3 trials down and back each Hurdle step overs in // bars lateral 3 trials down and back Step ups in // bars 1 x 10 each forward and lateral 4 inch    [facility] Adult PT Treatment:  DATE: 06/05/2022 Therapeutic Exercise: Nustep level 5 x 5 mins Free motion Deep squats x 10  Standing heel/toe raise 3x10 each Standing march 2 x 10,  3# Standing hip extension 2 x 10 each , 3# Standing hip abduction 2x10 each , 3# Slant board stretch x 30 sec  Sit to stand 2 x 10 Seated lumbar flexion stretch reaching to toes Supine hip flexor stretch x 60 seconds each Seated hamstring stretch 30 sec x 2 each  seconds each   Neuromuscular re-ed: A/P weight shifting on blue rocker board x 1 minute progressing to no UE assist  Romberg on airex EO 2x30", added trunk rotations Tandem on airex 3 x 30 sec   [facility] Adult PT Treatment:                                                DATE: 06/02/2022 Therapeutic Exercise: Nustep level 5 x 5 mins Supine hip flexor stretch x 60 seconds each Seated hamstring stretch x 60 seconds each  Hip bridge 2 x 10  Resisted abduction in hooklying 3 x 10; green band  Sit to stand 2 x 10  Standing hip extension 2 x 10 each  Standing heel/toe raise 3x10 each Standing hip abduction 2x10 each  Standing march 2 x 10  Neuromuscular re-ed: Romberg EO with head turns x 30 seconds; EC  x 30 seconds  Semi-tandem EO with head turns x 30 seconds; EC 2 x 30 seconds  Romberg on airex EO 2x30"           ASSESSMENT:   CLINICAL IMPRESSION: Skip did well with therapy today with no adverse reaction.  His balance is significantly improved and he was able to maintain tandem on foam for 108'' today.  He does  display less stability in L SLS activities vs R, but this is improving.   OBJECTIVE IMPAIRMENTS: Pain, hip flexor tightness, HS tightness, hip ext strength, balance, gait   ACTIVITY LIMITATIONS: feeling confident navigating stairs or in community   PERSONAL FACTORS: See medical history and pertinent history     REHAB POTENTIAL: Good   CLINICAL DECISION MAKING: Stable/uncomplicated   EVALUATION COMPLEXITY: Low     GOALS:     SHORT TERM GOALS: Target date: 06/12/2022   Sulayman will be >75% HEP compliant to improve carryover between sessions and facilitate independent management of condition   Evaluation (05/22/2022): ongoing Goal status: MET     LONG TERM GOALS: Target date: 07/17/2022   Caius will improve the following MMTs to >/= 4/5 to show improvement in strength:  hip ext strength    Evaluation/Baseline (05/22/2022): see chart in note Goal status: INITIAL     2.  Navi will show a >/= 15% improvement in their ABC score as a proxy for functional improvement    Evaluation/Baseline (05/22/2022): 75% confident Goal status: INITIAL       3.  Wetzel will imporve BERG balance score to 50 pts, to show a significant improvement in balance in order to reduce fall risk   Evaluation/Baseline (05/22/2022): 43 pts Goal status: INITIAL   See interpretation below:       4.  Jamion will be able to stand for >15'' in tandem stance, to show a significant improvement in balance in order to reduce fall risk    Evaluation/Baseline (05/22/2022): unable 7/19: 45'' tandem on foam Goal  status: MET   5. Archie will express confidence in navigating 10 steps using reciprocal pattern   Evaluation/Baseline (05/22/2022): 70% confident Goal status: INITIAL         PLAN: PT FREQUENCY: 1-2x/week   PT DURATION: 8 weeks (Ending 07/17/2022)   PLANNED INTERVENTIONS: Therapeutic exercises, Aquatic therapy, Therapeutic activity, Neuro Muscular re-education, Gait training, Patient/Family  education, Joint mobilization, Dry Needling, Electrical stimulation, Spinal mobilization and/or manipulation, Moist heat, Taping, Vasopneumatic device, Ionotophoresis 4mg /ml Dexamethasone, and Manual therapy   PLAN FOR NEXT SESSION: progressive balance, hip flexor/RF/HS stretching, hip ext strengthening, pt's wife is concerned about his hip flexibility   Kevan Ny Thresea Doble PT 06/18/22 11:29 AM

## 2022-06-19 ENCOUNTER — Ambulatory Visit (HOSPITAL_COMMUNITY)
Admission: RE | Admit: 2022-06-19 | Discharge: 2022-06-19 | Disposition: A | Discharge status: Home / Self Care | Payer: Medicare Other | Source: Ambulatory Visit | Attending: Interventional Cardiology | Admitting: Interventional Cardiology

## 2022-06-19 DIAGNOSIS — E785 Hyperlipidemia, unspecified: Secondary | ICD-10-CM | POA: Diagnosis not present

## 2022-06-19 DIAGNOSIS — I2510 Atherosclerotic heart disease of native coronary artery without angina pectoris: Secondary | ICD-10-CM | POA: Diagnosis not present

## 2022-06-19 DIAGNOSIS — I081 Rheumatic disorders of both mitral and tricuspid valves: Secondary | ICD-10-CM | POA: Diagnosis not present

## 2022-06-19 DIAGNOSIS — I10 Essential (primary) hypertension: Secondary | ICD-10-CM | POA: Diagnosis not present

## 2022-06-19 DIAGNOSIS — R0602 Shortness of breath: Secondary | ICD-10-CM | POA: Diagnosis not present

## 2022-06-19 DIAGNOSIS — Z951 Presence of aortocoronary bypass graft: Secondary | ICD-10-CM | POA: Diagnosis not present

## 2022-06-19 LAB — ECHOCARDIOGRAM COMPLETE
Area-P 1/2: 3.85 cm2
S' Lateral: 3.2 cm

## 2022-06-19 MED ORDER — PERFLUTREN LIPID MICROSPHERE
1.0000 mL | INTRAVENOUS | Status: AC | PRN
  Administered 2022-06-19: 2 mL via INTRAVENOUS

## 2022-06-20 ENCOUNTER — Encounter: Payer: Self-pay | Admitting: Physical Therapy

## 2022-06-20 ENCOUNTER — Telehealth: Payer: Self-pay

## 2022-06-20 ENCOUNTER — Ambulatory Visit: Payer: Medicare Other | Admitting: Physical Therapy

## 2022-06-20 DIAGNOSIS — R2681 Unsteadiness on feet: Secondary | ICD-10-CM | POA: Diagnosis not present

## 2022-06-20 DIAGNOSIS — I517 Cardiomegaly: Secondary | ICD-10-CM

## 2022-06-20 DIAGNOSIS — M6281 Muscle weakness (generalized): Secondary | ICD-10-CM

## 2022-06-20 DIAGNOSIS — R2689 Other abnormalities of gait and mobility: Secondary | ICD-10-CM

## 2022-06-20 DIAGNOSIS — I10 Essential (primary) hypertension: Secondary | ICD-10-CM

## 2022-06-20 MED ORDER — DAPAGLIFLOZIN PROPANEDIOL 10 MG PO TABS: 10.0000 mg | ORAL_TABLET | Freq: Every day | ORAL | 3 refills | Status: DC

## 2022-06-20 NOTE — Telephone Encounter (Signed)
The patient spouse [name] (ok per DPR) has been notified of the result and verbalized understanding.  All questions (if any) were answered. Precious Gilding, RN 06/20/2022 5:13 PM  Will place Farxiga 30 day RX coupon code and pt assistance information on my chart for review.  Advised of need for hygiene d/t starting Farxiga.

## 2022-06-20 NOTE — Telephone Encounter (Signed)
-----   Message from Belva Crome, MD sent at 06/19/2022  9:23 PM EDT ----- Let the patient know the echo shows right heart is enlarging and may be related to sleep apnea. Needs a sleep study and start Farxiga 10 mg daily for diastolic HF. BMET 2-3 weeks and report how breathing is doing. A copy will be sent to Stacie Glaze, DO

## 2022-06-20 NOTE — Therapy (Signed)
OUTPATIENT PHYSICAL THERAPY TREATMENT NOTE   Patient Name: Joshua [name]. MRN: [id number] DOB:1941/10/28, 81 y.o., male Today's Date: 06/20/2022  PCP: Dickie La, MD REFERRING PROVIDER: Dickie La, MD  END OF SESSION:   PT End of Session - 06/20/22 1042     Visit Number 7    Number of Visits 17    Date for PT Re-Evaluation 07/17/22    Authorization Type [employer] MCR    PT Start Time 6812    PT Stop Time 1125    PT Time Calculation (min) 40 min    Activity Tolerance Patient tolerated treatment well    Behavior During Therapy WFL for tasks assessed/performed              Past Medical History:  Diagnosis Date   Coronary artery disease involving native coronary artery of native heart with angina pectoris ([place])    a. 1990 s/p CABG x 4 (RIMA->LAD, LIMA->LCX, VG->Diag, VG->RCA); b. 2011 s/p BMS to VG->RCA.; c. Unstable Angina 05/2017: Patent RIMA-LAD & LIMA-LCx, ostSVG-RCA 65% ISR & mSVG-RCA  99% - DES PCI to both // Myoview 05/2019:  EF 57, no ischemia or scar, Low Risk    GERD (gastroesophageal reflux disease)    History of echocardiogram    a. 01/2017 Echo: EF 55-60%, no rwma, mild MR, midlly dil LA, PASP 46mmHg.   History of hiatal hernia    Hyperlipidemia    Hypertension    Obesity    Past Surgical History:  Procedure Laterality Date   CATARACT EXTRACTION W/ INTRAOCULAR LENS  IMPLANT, BILATERAL Bilateral    CORONARY ANGIOPLASTY WITH STENT PLACEMENT  2011   "RCA"   CORONARY ARTERY BYPASS GRAFT  1990   CABG X4   CORONARY STENT INTERVENTION N/A 06/24/2017   Procedure: Coronary Stent Intervention;  Surgeon: Belva Crome, MD;  Location: [facility];  Service: Cardiovascular: Ostial 4.0 x 12 mm Onyx DES reducing 70% stenosis to less than 40%. Distal body 3.5 x 15 Onyx DES reducing 99% stenosis to less than 30%.   LEFT HEART CATH AND CORS/GRAFTS ANGIOGRAPHY N/A 06/24/2017   Procedure: Left Heart Cath and Cors/Grafts Angiography;  Surgeon: Belva Crome, MD;   Location: [facility];  Service: Cardiovascular:  CTO native RCA, LAD & LCx (Graft Dependent). Patent LIMA-LCx & RIMA-LAD.  SVG-RCA: ost 65% ISR & mid 99% --> DES PCI to both   LEFT HEART CATH AND CORS/GRAFTS ANGIOGRAPHY N/A 01/04/2021   Procedure: LEFT HEART CATH AND CORS/GRAFTS ANGIOGRAPHY;  Surgeon: Belva Crome, MD;  Location: [facility];  Service: Cardiovascular;  Laterality: N/A;   Patient Active Problem List   Diagnosis Date Noted   Balance problems 01/31/2022   Right shoulder pain 01/31/2022   Dysphagia 01/31/2022   Healthcare maintenance 08/22/2021   History of non-ST elevation myocardial infarction (NSTEMI) 06/25/2017   Presence of drug coated stent in SVG-RCA 06/25/2017   Postherpetic neuralgia 01/02/2017   Tremor 05/15/2016   Irritability and anger 05/15/2016   Seborrheic keratoses 06/16/2014   Special screening for malignant neoplasm of prostate 06/14/2014   Retinal hemorrhage 07/23/2010   Impotence of organic origin 01/16/2010   Essential hypertension 04/18/2009   Hyperlipidemia with target low density lipoprotein (LDL) cholesterol less than 70 mg/dL 01/28/2007   CAD (coronary artery disease) 01/28/2007   HERNIA, HIATAL, NONCONGENITAL 01/28/2007    REFERRING DIAG: Balance problem  THERAPY DIAG:  Balance problems  Muscle weakness  Unsteadiness on feet  Other  abnormalities of gait and mobility  Rationale for Evaluation and Treatment Rehabilitation  PERTINENT HISTORY: Heart attack with 4 bypass grafts ~1990, 2018, 2022    PRECAUTIONS: fall   SUBJECTIVE:  Skip reports continued improvement in his leg strength and balance. PAIN:  Are you having pain? No   OBJECTIVE: (objective measures completed at initial evaluation unless otherwise dated)    DIAGNOSTIC FINDINGS:  NA             GENERAL OBSERVATION/GAIT:                     Slow gait, wide base of support, small step length             Vitals:           127/60, 02 sat 97, HR 53 (pt  states this is typical)   SENSATION:          Light touch: Appears intact   MUSCLE LENGTH: Hamstrings: Right moderate restriction; Left moderate restriction ASLR: Right ASLR = PSLR; Left ASLR = PSLR Thomas test: Right significant restriction; Left significant restriction Ely's test: Right significant restriction; Left significant restriction   LE MMT:   MMT Right 05/22/2022 Left 05/22/2022  Hip flexion (L2, L3) 4 4  Knee extension (L3) 4+ 4+  Knee flexion 4+ 4+  Hip abduction 4 4  Hip extension 3+ 3+  Hip external rotation      Hip internal rotation      Hip adduction      Ankle dorsiflexion (L4) 4 4  Ankle plantarflexion (S1)      Ankle inversion      Ankle eversion      Great Toe ext (L5)      Grossly        (Blank rows = not tested, score listed is out of 5 possible points.  N = WNL, D = diminished, C = clear for gross weakness with myotome testing, * = concordant pain with testing)   Functional Tests   Eval (05/22/2022)      BERG BALANCE TEST Sitting to Standing: 4.      Stands without using hands and stabilize independently Standing Unsupported: 4.      Stands safely for 2 minutes Sitting Unsupported: 4.     Sits for 2 minutes independently Standing to Sitting: 2.     Uses back of legs against chair to control descent  Transfers: 4.     Transfers safely with minor use of hands Standing with eyes closed: 3.     Stands 10 seconds with supervision Standing with feet together: 4.     Stands for 1 minute safely Reaching forward with outstretched arm: 3.     Reaches forward 5 inches Retrieving object from the floor: 3.     Able to pick up with supervision Turning to look behind: 2.     Turns sideways only, maintains balance Turning 360 degrees: 2.     Able to turn slowly, but safely Place alternate foot on stool: 4.     Completes 8 steps in 20 seconds     Standing with one foot in front: 2.     Independent small step for 30 seconds Standing on one foot: 2.     Holds >/=3  seconds   Total Score: 43/56      Tug: 13''  PATIENT SURVEYS:  ABC scale 75.6%  HOME EXERCISE PROGRAM:   Access Code: [email or web address] URL: [email or web address] Date: 05/29/2022 Prepared by: [place]  - 1 x daily - 7 x weekly - 2 sets - 1 min hold - Seated Hamstring Stretch  - 1 x daily - 7 x weekly - 2 sets - 60mn hold - Supine Bridge  - 1 x daily - 7 x weekly - 2 sets - 10 reps - Hooklying Clamshell with Resistance  - 1 x daily - 7 x weekly - 2 sets - 10 reps - Sit to Stand  - 1 x daily - 7 x weekly - 2 sets - 10 reps - Standing Hip Extension with Counter Support  - 1 x daily - 7 x weekly - 2 sets - 10 reps - Semi-Tandem Balance at [employer]  - 1 x daily - 7 x weekly - 3 sets - 30 sec  hold ASTERISK SIGNS     Asterisk Signs Eval (05/22/2022) 7/19   7/21        Hip flexor tightness Signficant bil   Subtle bil         RF tightness Significant bil   significantbil         Hip extensor strength L/R 3+/3+            Balance Tandem: unable Tandem on foam for 45''           Tug 13''                  TODAY'S TREATMENT:  [facility] Adult PT Treatment:                                                DATE: 06/20/22 Therapeutic Exercise: NuStep level 8 x 5 minutes  Supine modified thomas +RF EOM - 2 min ea Supine bridge with emphasis on hip ext - 2x10 Sit to stand with overhead press blue med ball 2 x 10  Step ups x 10 each lateral 6 inch   NRE: Tandem on foam - 45'' bouts Wooden rocker board  - 2' - DF/PF Airex balance beam walking (non-tandem) Heel toe walking  [facility] Adult PT Treatment:                                                DATE: 06/18/22 Therapeutic Exercise: NuStep level 8 x 5 minutes  Prone quad stretch with strap x 60 second each (NT) Figure 4 stretch x 60 seconds each (NT) Sit to stand with overhead press  yellow med ball 2 x 10  Hurdle step overs in // bars forward step to and reciprocal stepping 3 trials down and back each Hurdle step overs in // bars lateral 3 trials down and back Step ups 2 x 10 each forward and lateral 4 inch   NRE: Semi tandem on foam - 45'' bouts Tandem on foam - 45'' bouts Wooden rocker board  - 2' - DF/PF  [facility]  [facility] Therapeutic Exercise: NuStep level 6 x 5 minutes  Prone quad stretch with strap x 60 second each  Figure 4 stretch x 60 seconds each  Sit to stand with overhead press yellow med ball 2 x 10   Therapeutic Activity: Hurdle step overs in // bars forward step to and reciprocal stepping 3 trials down and back each Hurdle step overs in // bars lateral 3 trials down and back Step ups in // bars 1 x 10 each forward and lateral 4 inch    [facility] Adult PT Treatment:                                                DATE: 06/05/2022 Therapeutic Exercise: Nustep level 5 x 5 mins Free motion Deep squats x 10  Standing heel/toe raise 3x10 each Standing march 2 x 10,  3# Standing hip extension 2 x 10 each , 3# Standing hip abduction 2x10 each , 3# Slant board stretch x 30 sec  Sit to stand 2 x 10 Seated lumbar flexion stretch reaching to toes Supine hip flexor stretch x 60 seconds each Seated hamstring stretch 30 sec x 2 each  seconds each   Neuromuscular re-ed: A/P weight shifting on blue rocker board x 1 minute progressing to no UE assist  Romberg on airex EO 2x30", added trunk rotations Tandem on airex 3 x 30 sec   [facility] Adult PT Treatment:                                                DATE: 06/02/2022 Therapeutic Exercise: Nustep level 5 x 5 mins Supine hip flexor stretch x 60 seconds each Seated hamstring stretch x 60 seconds each  Hip bridge 2 x 10  Resisted abduction in hooklying 3 x 10; green band  Sit to stand 2 x 10  Standing hip extension 2 x 10 each  Standing heel/toe  raise 3x10 each Standing hip abduction 2x10 each  Standing march 2 x 10  Neuromuscular re-ed: Romberg EO with head turns x 30 seconds; EC  x 30 seconds  Semi-tandem EO with head turns x 30 seconds; EC 2 x 30 seconds  Romberg on airex EO 2x30"           ASSESSMENT:   CLINICAL IMPRESSION: Skip did well with therapy today with no adverse reaction.  He is showing consistent improvement in his balance and activity tolerance.  He does show more deficit achieving posterior vs anterior weight shift on wobble board which is consistent with his complaint of feeling like he will fall forward when picking up objects.  We will continue to address this in upcoming sessions.  OBJECTIVE IMPAIRMENTS: Pain, hip flexor tightness, HS tightness, hip ext strength, balance, gait   ACTIVITY LIMITATIONS: feeling confident navigating stairs or in community   PERSONAL FACTORS: See medical history and pertinent history     REHAB POTENTIAL: Good   CLINICAL DECISION MAKING: Stable/uncomplicated   EVALUATION COMPLEXITY: Low     GOALS:     SHORT TERM GOALS: Target date: 06/12/2022   Marqual will be >75% HEP compliant to improve carryover between sessions and facilitate independent management of condition   Evaluation (05/22/2022): ongoing Goal status: MET  LONG TERM GOALS: Target date: 07/17/2022   Robertt will improve the following MMTs to >/= 4/5 to show improvement in strength:  hip ext strength    Evaluation/Baseline (05/22/2022): see chart in note Goal status: INITIAL     2.  Tristram will show a >/= 15% improvement in their ABC score as a proxy for functional improvement    Evaluation/Baseline (05/22/2022): 75% confident Goal status: INITIAL       3.  Jaidev will imporve BERG balance score to 50 pts, to show a significant improvement in balance in order to reduce fall risk   Evaluation/Baseline (05/22/2022): 43 pts Goal status: INITIAL   See interpretation below:       4.  Clif  will be able to stand for >15'' in tandem stance, to show a significant improvement in balance in order to reduce fall risk    Evaluation/Baseline (05/22/2022): unable 7/19: 45'' tandem on foam Goal status: MET   5. Piotr will express confidence in navigating 10 steps using reciprocal pattern   Evaluation/Baseline (05/22/2022): 70% confident Goal status: INITIAL         PLAN: PT FREQUENCY: 1-2x/week   PT DURATION: 8 weeks (Ending 07/17/2022)   PLANNED INTERVENTIONS: Therapeutic exercises, Aquatic therapy, Therapeutic activity, Neuro Muscular re-education, Gait training, Patient/Family education, Joint mobilization, Dry Needling, Electrical stimulation, Spinal mobilization and/or manipulation, Moist heat, Taping, Vasopneumatic device, Ionotophoresis 62m/ml Dexamethasone, and Manual therapy   PLAN FOR NEXT SESSION: progressive balance, hip flexor/RF/HS stretching, hip ext strengthening, pt's wife is concerned about his hip flexibility   KKevan NyReinhartsen PT 06/20/22 11:28 AM

## 2022-06-23 ENCOUNTER — Telehealth: Payer: Self-pay

## 2022-06-23 ENCOUNTER — Telehealth: Payer: Self-pay | Admitting: *Deleted

## 2022-06-23 NOTE — Telephone Encounter (Signed)
Prior Authorization for [facility] sent to [facility] via Phone. NO PA REQ

## 2022-06-23 NOTE — Telephone Encounter (Signed)
-----   Message from Precious Gilding, RN sent at 06/20/2022  5:22 PM EDT ----- Regarding: itmar Dr. Tamala Julian ordered this pt to have a sleep study.  I put the order in and told pt someone would call back with more information.  Could you please follow up.  Elza Rafter, RN

## 2022-06-23 NOTE — Telephone Encounter (Signed)
I was sent a staff message that Dr. Tamala Julian had ordered an Itamar sleep study. I have s/w today with the pt's wife (DPR). Pt 's wife set up for them to come by the office 06/25/22 @ 11:15 for Itamar set up. We will use the pt's wife phone as she has a smart phone and blue tooth access.

## 2022-06-23 NOTE — Telephone Encounter (Signed)
**Note De-identified Kaulder Zahner Obfuscation** -----  **Note De-Identified Jamelle Goldston Obfuscation** Message from Precious Gilding, RN sent at 06/20/2022  5:20 PM EDT ----- Regarding: [name] 10 mg Hi, Dr. Tamala Julian has started this pt on Farxiga 10 mg PO QD.  I spoke with spouse she is concerned about medication cost.  So I sent information for 30 day free trial and pt assistance through my chart.  Could you please follow up with pt.    Elza Rafter, RN

## 2022-06-23 NOTE — Telephone Encounter (Signed)
**Note De-Identified Sherrin Stahle Obfuscation** The pts wife and DPR [name] is advised that they can contact [employer] and [place] at [phone] and apply for the patients over the phone. She is aware to call us back if [place] approves the pt for Farxiga asst so we can submit a providers page of their pt asst application that does include the needed Farxiga RX for #180 with 3 refills.  [name] stated that if the pt is not approved for asst it may be ok as she has found (after talking with the RN at Dr Thompson Caul office on Friday 7/21) that [name] is not as expensive as she first thought it was. She thanked me for calling her back to discuss.

## 2022-06-24 NOTE — Therapy (Signed)
OUTPATIENT PHYSICAL THERAPY TREATMENT NOTE   Patient Name: Joshua [name]. MRN: [id number] DOB:March 01, 1941, 81 y.o., male Today's Date: 06/25/2022  PCP: Dickie La, MD REFERRING PROVIDER: Dickie La, MD  END OF SESSION:   PT End of Session - 06/25/22 0952     Visit Number 8    Number of Visits 17    Date for PT Re-Evaluation 07/17/22    Authorization Type U[employer] MCR    PT Start Time 0955    PT Stop Time 7106    PT Time Calculation (min) 40 min    Activity Tolerance Patient tolerated treatment well    Behavior During Therapy [facility] for tasks assessed/performed               Past Medical History:  Diagnosis Date   Coronary artery disease involving native coronary artery of native heart with angina pectoris ([place])    a. 1990 s/p CABG x 4 (RIMA->LAD, LIMA->LCX, VG->Diag, VG->RCA); b. 2011 s/p BMS to VG->RCA.; c. Unstable Angina 05/2017: Patent RIMA-LAD & LIMA-LCx, ostSVG-RCA 65% ISR & mSVG-RCA  99% - DES PCI to both // Myoview 05/2019:  EF 57, no ischemia or scar, Low Risk    GERD (gastroesophageal reflux disease)    History of echocardiogram    a. 01/2017 Echo: EF 55-60%, no rwma, mild MR, midlly dil LA, PASP 86mHg.   History of hiatal hernia    Hyperlipidemia    Hypertension    Obesity    Past Surgical History:  Procedure Laterality Date   CATARACT EXTRACTION W/ INTRAOCULAR LENS  IMPLANT, BILATERAL Bilateral    CORONARY ANGIOPLASTY WITH STENT PLACEMENT  2011   "RCA"   CORONARY ARTERY BYPASS GRAFT  1990   CABG X4   CORONARY STENT INTERVENTION N/A 06/24/2017   Procedure: Coronary Stent Intervention;  Surgeon: SBelva Crome MD;  [facility]: [facility];  Service: Cardiovascular: Ostial 4.0 x 12 mm Onyx DES reducing 70% stenosis to less than 40%. Distal body 3.5 x 15 Onyx DES reducing 99% stenosis to less than 30%.   LEFT HEART CATH AND CORS/GRAFTS ANGIOGRAPHY N/A 06/24/2017   Procedure: Left Heart Cath and Cors/Grafts Angiography;  Surgeon: SBelva Crome MD;   [facility];  Service: Cardiovascular:  CTO native RCA, LAD & LCx (Graft Dependent). Patent LIMA-LCx & RIMA-LAD.  SVG-RCA: ost 65% ISR & mid 99% --> DES PCI to both   LEFT HEART CATH AND CORS/GRAFTS ANGIOGRAPHY N/A 01/04/2021   Procedure: LEFT HEART CATH AND CORS/GRAFTS ANGIOGRAPHY;  Surgeon: SBelva Crome MD;  [facility];  Service: Cardiovascular;  Laterality: N/A;   Patient Active Problem List   Diagnosis Date Noted   Balance problems 01/31/2022   Right shoulder pain 01/31/2022   Dysphagia 01/31/2022   Healthcare maintenance 08/22/2021   History of non-ST elevation myocardial infarction (NSTEMI) 06/25/2017   Presence of drug coated stent in SVG-RCA 06/25/2017   Postherpetic neuralgia 01/02/2017   Tremor 05/15/2016   Irritability and anger 05/15/2016   Seborrheic keratoses 06/16/2014   Special screening for malignant neoplasm of prostate 06/14/2014   Retinal hemorrhage 07/23/2010   Impotence of organic origin 01/16/2010   Essential hypertension 04/18/2009   Hyperlipidemia with target low density lipoprotein (LDL) cholesterol less than 70 mg/dL 01/28/2007   CAD (coronary artery disease) 01/28/2007   HERNIA, HIATAL, NONCONGENITAL 01/28/2007    REFERRING DIAG: Balance problem  THERAPY DIAG:  Balance problems  Muscle weakness  Unsteadiness on feet  Other abnormalities of gait and mobility  Rationale for Evaluation and Treatment Rehabilitation  PERTINENT HISTORY: Heart attack with 4 bypass grafts ~1990, 2018, 2022    PRECAUTIONS: fall   SUBJECTIVE: Patient reports occasional HEP compliance. PAIN:  Are you having pain? No   OBJECTIVE: (objective measures completed at initial evaluation unless otherwise dated)    DIAGNOSTIC FINDINGS:  NA             GENERAL OBSERVATION/GAIT:                     Slow gait, wide base of support, small step length             Vitals:           127/60, 02 sat 97, HR 53 (pt states this is typical)    SENSATION:          Light touch: Appears intact   MUSCLE LENGTH: Hamstrings: Right moderate restriction; Left moderate restriction ASLR: Right ASLR = PSLR; Left ASLR = PSLR Thomas test: Right significant restriction; Left significant restriction Ely's test: Right significant restriction; Left significant restriction   LE MMT:   MMT Right 05/22/2022 Left 05/22/2022  Hip flexion (L2, L3) 4 4  Knee extension (L3) 4+ 4+  Knee flexion 4+ 4+  Hip abduction 4 4  Hip extension 3+ 3+  Hip external rotation      Hip internal rotation      Hip adduction      Ankle dorsiflexion (L4) 4 4  Ankle plantarflexion (S1)      Ankle inversion      Ankle eversion      Great Toe ext (L5)      Grossly        (Blank rows = not tested, score listed is out of 5 possible points.  N = WNL, D = diminished, C = clear for gross weakness with myotome testing, * = concordant pain with testing)   Functional Tests   Eval (05/22/2022)      BERG BALANCE TEST Sitting to Standing: 4.      Stands without using hands and stabilize independently Standing Unsupported: 4.      Stands safely for 2 minutes Sitting Unsupported: 4.     Sits for 2 minutes independently Standing to Sitting: 2.     Uses back of legs against chair to control descent  Transfers: 4.     Transfers safely with minor use of hands Standing with eyes closed: 3.     Stands 10 seconds with supervision Standing with feet together: 4.     Stands for 1 minute safely Reaching forward with outstretched arm: 3.     Reaches forward 5 inches Retrieving object from the floor: 3.     Able to pick up with supervision Turning to look behind: 2.     Turns sideways only, maintains balance Turning 360 degrees: 2.     Able to turn slowly, but safely Place alternate foot on stool: 4.     Completes 8 steps in 20 seconds     Standing with one foot in front: 2.     Independent small step for 30 seconds Standing on one foot: 2.     Holds >/=3 seconds   Total Score:  43/56      Tug: 13''  PATIENT SURVEYS:  ABC scale 75.6%  HOME EXERCISE PROGRAM:   Access Code: [email or web address] URL: [email or web address] Date: 05/29/2022 Prepared by: [place]  - 1 x daily - 7 x weekly - 2 sets - 1 min hold - Seated Hamstring Stretch  - 1 x daily - 7 x weekly - 2 sets - 52mn hold - Supine Bridge  - 1 x daily - 7 x weekly - 2 sets - 10 reps - Hooklying Clamshell with Resistance  - 1 x daily - 7 x weekly - 2 sets - 10 reps - Sit to Stand  - 1 x daily - 7 x weekly - 2 sets - 10 reps - Standing Hip Extension with Counter Support  - 1 x daily - 7 x weekly - 2 sets - 10 reps - Semi-Tandem Balance at [employer]  - 1 x daily - 7 x weekly - 3 sets - 30 sec  hold ASTERISK SIGNS     Asterisk Signs Eval (05/22/2022) 7/19   7/21        Hip flexor tightness Signficant bil   Subtle bil         RF tightness Significant bil   significantbil         Hip extensor strength L/R 3+/3+            Balance Tandem: unable Tandem on foam for 45''           Tug 13''                  TODAY'S TREATMENT: [facility] Adult PT Treatment:                                                DATE: 06/24/2022 Therapeutic Exercise: NuStep level 8 x 5 minutes  Supine modified thomas +RF EOM - 2 min ea Supine bridge with emphasis on hip ext - 2x10 Sit to stand with overhead press blue med ball 2 x 10  Step ups x 10 each lateral 6 inch  NRE: Tandem on foam - 45'' bouts Wooden rocker board  - 2' - DF/PF Airex balance beam walking (non-tandem) Romberg stance on airex EC x30" Heel toe walking at counter x 2 laps  [facility]                                                [facility]: [facility] Therapeutic Exercise: NuStep level 8 x 5 minutes  Supine modified thomas +RF EOM - 2 min ea Supine bridge with emphasis on hip ext - 2x10 Sit to stand  with overhead press blue med ball 2 x 10  Step ups x 10 each lateral 6 inch  NRE: Tandem on foam - 45'' bouts Wooden rocker board  - 2' - DF/PF Airex balance beam walking (non-tandem) Heel toe walking  [facility] Adult PT Treatment:                                                DATE: 06/18/22 Therapeutic Exercise: NuStep level 8 x 5  minutes  Prone quad stretch with strap x 60 second each (NT) Figure 4 stretch x 60 seconds each (NT) Sit to stand with overhead press yellow med ball 2 x 10  Hurdle step overs in // bars forward step to and reciprocal stepping 3 trials down and back each Hurdle step overs in // bars lateral 3 trials down and back Step ups 2 x 10 each forward and lateral 4 inch   NRE: Semi tandem on foam - 45'' bouts Tandem on foam - 45'' bouts Wooden rocker board  - 2' - DF/PF  [facility] Adult PT Treatment:                                                DATE: 06/13/22 Therapeutic Exercise: NuStep level 6 x 5 minutes  Prone quad stretch with strap x 60 second each  Figure 4 stretch x 60 seconds each  Sit to stand with overhead press yellow med ball 2 x 10   Therapeutic Activity: Hurdle step overs in // bars forward step to and reciprocal stepping 3 trials down and back each Hurdle step overs in // bars lateral 3 trials down and back Step ups in // bars 1 x 10 each forward and lateral 4 inch     ASSESSMENT:   CLINICAL IMPRESSION: Patient presents to PT reporting occasional HEP compliance. Session today continued to focus on LE strengthening as well as balance training. Patient was able to tolerate all prescribed exercises with no adverse effects. Patient continues to benefit from skilled PT services and should be progressed as able to improve functional independence.    OBJECTIVE IMPAIRMENTS: Pain, hip flexor tightness, HS tightness, hip ext strength, balance, gait   ACTIVITY LIMITATIONS: feeling confident navigating stairs or in community   PERSONAL FACTORS: See medical  history and pertinent history     REHAB POTENTIAL: Good   CLINICAL DECISION MAKING: Stable/uncomplicated   EVALUATION COMPLEXITY: Low     GOALS:     SHORT TERM GOALS: Target date: 06/12/2022   [place]uster will be >75% HEP compliant to improve carryover between sessions and facilitate independent management of condition   Evaluation (05/22/2022): ongoing Goal status: MET     LONG TERM GOALS: Target date: 07/17/2022   Nathan will improve the following MMTs to >/= 4/5 to show improvement in strength:  hip ext strength    Evaluation/Baseline (05/22/2022): see chart in note Goal status: INITIAL     2.  Niranjan will show a >/= 15% improvement in their ABC score as a proxy for functional improvement    Evaluation/Baseline (05/22/2022): 75% confident Goal status: INITIAL       3.  Sallie will imporve BERG balance score to 50 pts, to show a significant improvement in balance in order to reduce fall risk   Evaluation/Baseline (05/22/2022): 43 pts Goal status: INITIAL   See interpretation below:       4.  Kingstin will be able to stand for >15'' in tandem stance, to show a significant improvement in balance in order to reduce fall risk    Evaluation/Baseline (05/22/2022): unable 7/19: 45'' tandem on foam Goal status: MET   5. Chuckie will express confidence in navigating 10 steps using reciprocal pattern   Evaluation/Baseline (05/22/2022): 70% confident Goal status: INITIAL         PLAN: PT FREQUENCY: 1-2x/week   PT  DURATION: 8 weeks (Ending 07/17/2022)   PLANNED INTERVENTIONS: Therapeutic exercises, Aquatic therapy, Therapeutic activity, Neuro Muscular re-education, Gait training, Patient/Family education, Joint mobilization, Dry Needling, Electrical stimulation, Spinal mobilization and/or manipulation, Moist heat, Taping, Vasopneumatic device, Ionotophoresis 54m/ml Dexamethasone, and Manual therapy   PLAN FOR NEXT SESSION: progressive balance, hip flexor/RF/HS stretching, hip  ext strengthening, pt's wife is concerned about his hip flexibility   SEvelene CroonPTA 06/25/22 10:35 AM

## 2022-06-25 ENCOUNTER — Encounter (INDEPENDENT_AMBULATORY_CARE_PROVIDER_SITE_OTHER): Payer: Medicare Other | Admitting: Cardiology

## 2022-06-25 ENCOUNTER — Ambulatory Visit: Payer: Medicare Other

## 2022-06-25 DIAGNOSIS — R2681 Unsteadiness on feet: Secondary | ICD-10-CM | POA: Diagnosis not present

## 2022-06-25 DIAGNOSIS — M6281 Muscle weakness (generalized): Secondary | ICD-10-CM | POA: Diagnosis not present

## 2022-06-25 DIAGNOSIS — R2689 Other abnormalities of gait and mobility: Secondary | ICD-10-CM | POA: Diagnosis not present

## 2022-06-25 DIAGNOSIS — G4733 Obstructive sleep apnea (adult) (pediatric): Secondary | ICD-10-CM | POA: Diagnosis not present

## 2022-06-25 NOTE — Telephone Encounter (Signed)
Pt and his wife came by the office today for [place] set up. Pt has been approved as well; pin # [phone] was given today. Pt agreeable to signed waiver.   Called and made the patient aware that he may proceed with the [facility] Sleep Study. PIN # provided to the patient. Patient made aware that he will be contacted after the test has been read with the results and any recommendations. Patient verbalized understanding and thanked me for the call.       Sleep Apnea Evaluation  [facility]  Today's Date: 06/25/2022   Patient Name: Joshua [name].        DOB: September 13, 1941       Height:        Weight:    BMI: There is no height or weight on file to calculate BMI.    Referring Provider:  he   STOP-BANG RISK ASSESSMENT       06/25/2022   11:12 AM  STOP-BANG  Do you snore loudly? No  Do you often feel tired, fatigued, or sleepy during the daytime? Yes  Has anyone observed you stop breathing during sleep? No  Do you have (or are you being treated for) high blood pressure? Yes  Recent BMI (Calculated) 35.28  Is BMI greater than 35 kg/m2? 1=Yes  Age older than 79 years old? 1=Yes  Has large neck size > 40 cm (15.7 in, large male shirt size, large male collar size > 16) Yes  Gender - Male 1=Yes  STOP-Bang Total Score 6      If STOP-BANG Score ?3 OR two clinical symptoms - patient qualifies for WatchPAT (CPT 95800)      Sleep study ordered due to two (2) of the following clinical symptoms/diagnoses:  Excessive daytime sleepiness G47.10  Gastroesophageal reflux K21.9  Nocturia R35.1  Morning Headaches G44.221  Difficulty concentrating R41.840  Memory problems or poor judgment G31.84  Personality changes or irritability R45.4  Loud snoring R06.83  Depression F32.9  Unrefreshed by sleep G47.8  Impotence N52.9  History of high blood pressure R03.0  Insomnia G47.00  Sleep Disordered Breathing or Sleep Apnea ICD G47.33

## 2022-06-25 NOTE — Telephone Encounter (Signed)
Sleep Apnea Evaluation  [facility]  Today's Date: 06/25/2022   Patient Name: Joshua [name].        DOB: 10/08/1941       Height:        Weight:    BMI: There is no height or weight on file to calculate BMI.    Referring Provider:     STOP-BANG RISK ASSESSMENT         If STOP-BANG Score ?3 OR two clinical symptoms - patient qualifies for WatchPAT (CPT 95800)      Sleep study ordered due to two (2) of the following clinical symptoms/diagnoses:  Excessive daytime sleepiness G47.10  Gastroesophageal reflux K21.9  Nocturia R35.1  Morning Headaches G44.221  Difficulty concentrating R41.840  Memory problems or poor judgment G31.84  Personality changes or irritability R45.4  Loud snoring R06.83  Depression F32.9  Unrefreshed by sleep G47.8  Impotence N52.9  History of high blood pressure R03.0  Insomnia G47.00  Sleep Disordered Breathing or Sleep Apnea ICD G47.33

## 2022-06-26 ENCOUNTER — Ambulatory Visit: Payer: Medicare Other

## 2022-06-26 DIAGNOSIS — I517 Cardiomegaly: Secondary | ICD-10-CM

## 2022-06-26 DIAGNOSIS — I10 Essential (primary) hypertension: Secondary | ICD-10-CM

## 2022-06-26 NOTE — Procedures (Signed)
    SLEEP STUDY REPORT Patient Information Study Date: 06/25/22 Patient Name: Joshua [name] Patient ID: [id number] Birth Date: 10-Oct-2041 Age: 81 Gender: Male BMI: 35.1 (W=231 lb, H=5' 8'') Neck Circ.: 17 '' Referring Physician: Daneen Schick MD  TEST DESCRIPTION: Home sleep apnea testing was completed using the WatchPat, a Type 1 device, utilizing  peripheral arterial tonometry (PAT), chest movement, actigraphy, pulse oximetry, pulse rate, body position and snore.  AHI was calculated with apnea and hypopnea using valid sleep time as the denominator. RDI includes apneas,  hypopneas, and RERAs. The data acquired and the scoring of sleep and all associated events were performed in  accordance with the recommended standards and specifications as outlined in the AASM Manual for the Scoring of  Sleep and Associated Events 2.2.0 (2015).   FINDINGS:  1. Mild Obstructive Sleep Apnea with AHI 10.9/hr.  2. No Central Sleep Apnea with pAHIc 0/hr.  3. Oxygen desaturations as low as 81%.  4. Mild snoring was present. O2 sats were < 88% for 6.6 min.  5. Total sleep time was 7 hrs and 0 min.  6. 11.9% of total sleep time was spent in REM sleep.  7. sleep onset latency at 18 min.  8. REM sleep onset latency at 369 min.  9. Total awakenings were 9.   DIAGNOSIS: Mild Obstructive Sleep Apnea (G47.33) Nocturnal Hypoxemia  RECOMMENDATIONS: 1. Clinical correlation of these findings is necessary. The decision to treat obstructive sleep apnea (OSA) is usually  based on the presence of apnea symptoms or the presence of associated medical conditions such as Hypertension,  Congestive Heart Failure, Atrial Fibrillation or Obesity. The most common symptoms of OSA are snoring, gasping for  breath while sleeping, daytime sleepiness and fatigue.   2. Initiating apnea therapy is recommended given the presence of symptoms and/or associated conditions.  Recommend proceeding with one of the following:   a.  Auto-CPAP therapy with a pressure range of 5-20cm H2O.   b. An oral appliance (OA) that can be obtained from certain dentists with expertise in sleep medicine. These are  primarily of use in non-obese patients with mild and moderate disease.   c. An ENT consultation which may be useful to look for specific causes of obstruction and possible treatment  options.   d. If patient is intolerant to PAP therapy, consider referral to ENT for evaluation for hypoglossal nerve stimulator.   3. Close follow-up is necessary to ensure success with CPAP or oral appliance therapy for maximum benefit .  4. A follow-up oximetry study on CPAP is recommended to assess the adequacy of therapy and determine the need  for supplemental oxygen or the potential need for Bi-level therapy. An arterial blood gas to determine the adequacy of  baseline ventilation and oxygenation should also be considered.  5. Healthy sleep recommendations include: adequate nightly sleep (normal 7-9 hrs/night), avoidance of caffeine after  noon and alcohol near bedtime, and maintaining a sleep environment that is cool, dark and quiet.  6. Weight loss for overweight patients is recommended. Even modest amounts of weight loss can significantly  improve the severity of sleep apnea.  7. Snoring recommendations include: weight loss where appropriate, side sleeping, and avoidance of alcohol before  bed.  8. Operation of motor vehicle should be avoided when sleepy.  Signature: Electronically Signed: 06/26/22 Fransico Him, MD; [facility]; [place], American Board of  Sleep Medicine

## 2022-06-27 ENCOUNTER — Encounter: Payer: Self-pay | Admitting: Physical Therapy

## 2022-06-27 ENCOUNTER — Ambulatory Visit: Payer: Medicare Other | Admitting: Physical Therapy

## 2022-06-27 DIAGNOSIS — M6281 Muscle weakness (generalized): Secondary | ICD-10-CM

## 2022-06-27 DIAGNOSIS — R2681 Unsteadiness on feet: Secondary | ICD-10-CM

## 2022-06-27 DIAGNOSIS — R2689 Other abnormalities of gait and mobility: Secondary | ICD-10-CM

## 2022-06-27 NOTE — Therapy (Signed)
OUTPATIENT PHYSICAL THERAPY TREATMENT NOTE   Patient Name: Joshua [name]. MRN: [id number] DOB:July 15, 1941, 81 y.o., male Today's Date: 06/27/2022  PCP: Dickie La, MD REFERRING PROVIDER: Dickie La, MD  END OF SESSION:   PT End of Session - 06/27/22 0950     Visit Number 9    Number of Visits 17    Date for PT Re-Evaluation 07/17/22    Authorization Type [employer] MCR    PT Start Time 0950    PT Stop Time 1031    PT Time Calculation (min) 41 min    Activity Tolerance Patient tolerated treatment well    Behavior During Therapy [facility] for tasks assessed/performed               Past Medical History:  Diagnosis Date   Coronary artery disease involving native coronary artery of native heart with angina pectoris ([place])    a. 1990 s/p CABG x 4 (RIMA->LAD, LIMA->LCX, VG->Diag, VG->RCA); b. 2011 s/p BMS to VG->RCA.; c. Unstable Angina 05/2017: Patent RIMA-LAD & LIMA-LCx, ostSVG-RCA 65% ISR & mSVG-RCA  99% - DES PCI to both // Myoview 05/2019:  EF 57, no ischemia or scar, Low Risk    GERD (gastroesophageal reflux disease)    History of echocardiogram    a. 01/2017 Echo: EF 55-60%, no rwma, mild MR, midlly dil LA, PASP 21mHg.   History of hiatal hernia    Hyperlipidemia    Hypertension    Obesity    Past Surgical History:  Procedure Laterality Date   CATARACT EXTRACTION W/ INTRAOCULAR LENS  IMPLANT, BILATERAL Bilateral    CORONARY ANGIOPLASTY WITH STENT PLACEMENT  2011   "RCA"   CORONARY ARTERY BYPASS GRAFT  1990   CABG X4   CORONARY STENT INTERVENTION N/A 06/24/2017   Procedure: Coronary Stent Intervention;  Surgeon: SBelva Crome MD;  [facility];  Service: Cardiovascular: Ostial 4.0 x 12 mm Onyx DES reducing 70% stenosis to less than 40%. Distal body 3.5 x 15 Onyx DES reducing 99% stenosis to less than 30%.   LEFT HEART CATH AND CORS/GRAFTS ANGIOGRAPHY N/A 06/24/2017   Procedure: Left Heart Cath and Cors/Grafts Angiography;  Surgeon: SBelva Crome MD;   [facility];  Service: Cardiovascular:  CTO native RCA, LAD & LCx (Graft Dependent). Patent LIMA-LCx & RIMA-LAD.  SVG-RCA: ost 65% ISR & mid 99% --> DES PCI to both   LEFT HEART CATH AND CORS/GRAFTS ANGIOGRAPHY N/A 01/04/2021   Procedure: LEFT HEART CATH AND CORS/GRAFTS ANGIOGRAPHY;  Surgeon: SBelva Crome MD;  [facility];  Service: Cardiovascular;  Laterality: N/A;   Patient Active Problem List   Diagnosis Date Noted   Balance problems 01/31/2022   Right shoulder pain 01/31/2022   Dysphagia 01/31/2022   Healthcare maintenance 08/22/2021   History of non-ST elevation myocardial infarction (NSTEMI) 06/25/2017   Presence of drug coated stent in SVG-RCA 06/25/2017   Postherpetic neuralgia 01/02/2017   Tremor 05/15/2016   Irritability and anger 05/15/2016   Seborrheic keratoses 06/16/2014   Special screening for malignant neoplasm of prostate 06/14/2014   Retinal hemorrhage 07/23/2010   Impotence of organic origin 01/16/2010   Essential hypertension 04/18/2009   Hyperlipidemia with target low density lipoprotein (LDL) cholesterol less than 70 mg/dL 01/28/2007   CAD (coronary artery disease) 01/28/2007   HERNIA, HIATAL, NONCONGENITAL 01/28/2007    REFERRING DIAG: Balance problem  THERAPY DIAG:  Balance problems  Muscle weakness  Unsteadiness on feet  Other abnormalities of gait and mobility  Rationale for Evaluation and Treatment Rehabilitation  PERTINENT HISTORY: Heart attack with 4 bypass grafts ~1990, 2018, 2022    PRECAUTIONS: fall   SUBJECTIVE: Pt reports continued improvement and feeling more steady at home. PAIN:  Are you having pain? No   OBJECTIVE: (objective measures completed at initial evaluation unless otherwise dated)    DIAGNOSTIC FINDINGS:  NA             GENERAL OBSERVATION/GAIT:                     Slow gait, wide base of support, small step length             Vitals:           127/60, 02 sat 97, HR 53 (pt states  this is typical)   SENSATION:          Light touch: Appears intact   MUSCLE LENGTH: Hamstrings: Right moderate restriction; Left moderate restriction ASLR: Right ASLR = PSLR; Left ASLR = PSLR Thomas test: Right significant restriction; Left significant restriction Ely's test: Right significant restriction; Left significant restriction   LE MMT:   MMT Right 05/22/2022 Left 05/22/2022  Hip flexion (L2, L3) 4 4  Knee extension (L3) 4+ 4+  Knee flexion 4+ 4+  Hip abduction 4 4  Hip extension 3+ 3+  Hip external rotation      Hip internal rotation      Hip adduction      Ankle dorsiflexion (L4) 4 4  Ankle plantarflexion (S1)      Ankle inversion      Ankle eversion      Great Toe ext (L5)      Grossly        (Blank rows = not tested, score listed is out of 5 possible points.  N = WNL, D = diminished, C = clear for gross weakness with myotome testing, * = concordant pain with testing)   Functional Tests   Eval (05/22/2022)      BERG BALANCE TEST Sitting to Standing: 4.      Stands without using hands and stabilize independently Standing Unsupported: 4.      Stands safely for 2 minutes Sitting Unsupported: 4.     Sits for 2 minutes independently Standing to Sitting: 2.     Uses back of legs against chair to control descent  Transfers: 4.     Transfers safely with minor use of hands Standing with eyes closed: 3.     Stands 10 seconds with supervision Standing with feet together: 4.     Stands for 1 minute safely Reaching forward with outstretched arm: 3.     Reaches forward 5 inches Retrieving object from the floor: 3.     Able to pick up with supervision Turning to look behind: 2.     Turns sideways only, maintains balance Turning 360 degrees: 2.     Able to turn slowly, but safely Place alternate foot on stool: 4.     Completes 8 steps in 20 seconds     Standing with one foot in front: 2.     Independent small step for 30 seconds Standing on one foot: 2.     Holds >/=3  seconds   Total Score: 43/56      Tug: 13''  PATIENT SURVEYS:  ABC scale 75.6%  HOME EXERCISE PROGRAM:   Access Code: [email or web address] URL: [email or web address] Date: 05/29/2022 Prepared by: Spring  - 1 x daily - 7 x weekly - 2 sets - 1 min hold - Seated Hamstring Stretch  - 1 x daily - 7 x weekly - 2 sets - 59mn hold - Supine Bridge  - 1 x daily - 7 x weekly - 2 sets - 10 reps - Hooklying Clamshell with Resistance  - 1 x daily - 7 x weekly - 2 sets - 10 reps - Sit to Stand  - 1 x daily - 7 x weekly - 2 sets - 10 reps - Standing Hip Extension with Counter Support  - 1 x daily - 7 x weekly - 2 sets - 10 reps - Semi-Tandem Balance at [employer]  - 1 x daily - 7 x weekly - 3 sets - 30 sec  hold ASTERISK SIGNS     Asterisk Signs Eval (05/22/2022) 7/19   7/21        Hip flexor tightness Signficant bil   Subtle bil         RF tightness Significant bil   significantbil         Hip extensor strength L/R 3+/3+            Balance Tandem: unable Tandem on foam for 45''           Tug 13''                  TODAY'S TREATMENT:  [facility] Adult PT Treatment:                                                DATE: 06/27/2022 Therapeutic Exercise: NuStep level 8 x 5 minutes no UE Supine modified thomas +RF EOM - 2 min ea Supine staggered bridge with emphasis on hip ext - 2x10 Sit to stand D/L with full hip ext - 3x10 - 10# - for power Step ups x 10 each lateral 6 inch  NRE: Tandem on foam - 45'' bouts Wooden rocker board  - 2' - DF/PF Airex balance beam walking (non-tandem) Heel toe walking at counter x 2 laps  [facility]                                                [facility]: [facility] Therapeutic Exercise: NuStep level 8 x 5 minutes  Supine modified thomas +RF EOM - 2 min ea Supine bridge with emphasis on hip ext -  2x10 Sit to stand with overhead press blue med ball 2 x 10  Step ups x 10 each lateral 6 inch  NRE: Tandem on foam - 45'' bouts Wooden rocker board  - 2' - DF/PF Airex balance beam walking (non-tandem) Romberg stance on airex EC x30" Heel toe walking at counter x 2 laps  [facility]  [facility]: 06[facility]22 Therapeutic Exercise: NuStep level 8 x 5 minutes  Supine modified thomas +RF EOM - 2 min ea Supine bridge with emphasis on hip ext - 2x10 Sit to stand with overhead press blue med ball 2 x 10  Step ups x 10 each lateral 6 inch  NRE: Tandem on foam - 45'' bouts Wooden rocker board  - 2' - DF/PF Airex balance beam walking (non-tandem) Heel toe walking  [facility] Adult PT Treatment:                                                DATE: 06/18/22 Therapeutic Exercise: NuStep level 8 x 5 minutes  Prone quad stretch with strap x 60 second each (NT) Figure 4 stretch x 60 seconds each (NT) Sit to stand with overhead press yellow med ball 2 x 10  Hurdle step overs in // bars forward step to and reciprocal stepping 3 trials down and back each Hurdle step overs in // bars lateral 3 trials down and back Step ups 2 x 10 each forward and lateral 4 inch   NRE: Semi tandem on foam - 45'' bouts Tandem on foam - 45'' bouts Wooden rocker board  - 2' - DF/PF  [facility] Adult PT Treatment:                                                DATE: 06/13/22 Therapeutic Exercise: NuStep level 6 x 5 minutes  Prone quad stretch with strap x 60 second each  Figure 4 stretch x 60 seconds each  Sit to stand with overhead press yellow med ball 2 x 10   Therapeutic Activity: Hurdle step overs in // bars forward step to and reciprocal stepping 3 trials down and back each Hurdle step overs in // bars lateral 3 trials down and back Step ups in // bars 1 x 10 each forward and lateral 4 inch     ASSESSMENT:   CLINICAL IMPRESSION: Joshua [name] continues to make good  progress with therapy with improved confidence in balance exercises, particularly with narrow base of support.  We also worked on posterior chain power with introduction on sit to stand d/l with speed component to improve pt complaint of falling forward.   OBJECTIVE IMPAIRMENTS: Pain, hip flexor tightness, HS tightness, hip ext strength, balance, gait   ACTIVITY LIMITATIONS: feeling confident navigating stairs or in community   PERSONAL FACTORS: See medical history and pertinent history     REHAB POTENTIAL: Good   CLINICAL DECISION MAKING: Stable/uncomplicated   EVALUATION COMPLEXITY: Low     GOALS:     SHORT TERM GOALS: Target date: 06/12/2022   Jadarrius will be >75% HEP compliant to improve carryover between sessions and facilitate independent management of condition   Evaluation (05/22/2022): ongoing Goal status: MET     LONG TERM GOALS: Target date: 07/17/2022   Joshua [name] will improve the following MMTs to >/= 4/5 to show improvement in strength:  hip ext strength    Evaluation/Baseline (05/22/2022): see chart in note Goal status: INITIAL     2.  Joshua [name] will show a >/= 15% improvement in their ABC score as a proxy for functional improvement    Evaluation/Baseline (05/22/2022): 75% confident Goal status: INITIAL  3.  Joshua [name] will imporve BERG balance score to 50 pts, to show a significant improvement in balance in order to reduce fall risk   Evaluation/Baseline (05/22/2022): 43 pts Goal status: INITIAL   See interpretation below:       4.  Joshua [name] will be able to stand for >15'' in tandem stance, to show a significant improvement in balance in order to reduce fall risk    Evaluation/Baseline (05/22/2022): unable 7/19: 45'' tandem on foam Goal status: MET   5. Joshua [name] will express confidence in navigating 10 steps using reciprocal pattern   Evaluation/Baseline (05/22/2022): 70% confident Goal status: INITIAL         PLAN: PT FREQUENCY: 1-2x/week   PT  DURATION: 8 weeks (Ending 07/17/2022)   PLANNED INTERVENTIONS: Therapeutic exercises, Aquatic therapy, Therapeutic activity, Neuro Muscular re-education, Gait training, Patient/Family education, Joint mobilization, Dry Needling, Electrical stimulation, Spinal mobilization and/or manipulation, Moist heat, Taping, Vasopneumatic device, Ionotophoresis 22m/ml Dexamethasone, and Manual therapy   PLAN FOR NEXT SESSION: progressive balance, hip flexor/RF/HS stretching, hip ext strengthening, pt's wife is concerned about his hip flexibility   KKevan NyReinhartsen PT 06/27/22 10:36 AM

## 2022-07-02 ENCOUNTER — Encounter: Payer: Self-pay | Admitting: Physical Therapy

## 2022-07-02 ENCOUNTER — Telehealth: Payer: Self-pay

## 2022-07-02 ENCOUNTER — Ambulatory Visit: Payer: Medicare Other | Attending: Family Medicine | Admitting: Physical Therapy

## 2022-07-02 DIAGNOSIS — M6281 Muscle weakness (generalized): Secondary | ICD-10-CM | POA: Diagnosis not present

## 2022-07-02 DIAGNOSIS — R2681 Unsteadiness on feet: Secondary | ICD-10-CM | POA: Insufficient documentation

## 2022-07-02 DIAGNOSIS — R2689 Other abnormalities of gait and mobility: Secondary | ICD-10-CM | POA: Insufficient documentation

## 2022-07-02 NOTE — Telephone Encounter (Signed)
-----   Message from Belva Crome, MD sent at 06/19/2022  9:23 PM EDT ----- Let the patient know the echo shows right heart is enlarging and may be related to sleep apnea. Needs a sleep study and start Farxiga 10 mg daily for diastolic HF. BMET 2-3 weeks and report how breathing is doing. A copy will be sent to Stacie Glaze, DO

## 2022-07-02 NOTE — Therapy (Signed)
Progress Note Reporting Period 6/22 to 8/2  See note below for Objective Data and Assessment of Progress/Goals.      Patient Name: Joshua [name]. MRN: [id number] DOB:06-19-1941, 81 y.o., male Today's Date: 07/02/2022  PCP: Dickie La, MD REFERRING PROVIDER: Dickie La, MD  END OF SESSION:   PT End of Session - 07/02/22 1000     Visit Number 10    Number of Visits 17    Date for PT Re-Evaluation 07/17/22    Authorization Type [employer] MCR    PT Start Time 1000    PT Stop Time 0092    PT Time Calculation (min) 41 min    Activity Tolerance Patient tolerated treatment well    Behavior During Therapy WFL for tasks assessed/performed               Past Medical History:  Diagnosis Date   Coronary artery disease involving native coronary artery of native heart with angina pectoris ([place])    a. 1990 s/p CABG x 4 (RIMA->LAD, LIMA->LCX, VG->Diag, VG->RCA); b. 2011 s/p BMS to VG->RCA.; c. Unstable Angina 05/2017: Patent RIMA-LAD & LIMA-LCx, ostSVG-RCA 65% ISR & mSVG-RCA  99% - DES PCI to both // Myoview 05/2019:  EF 57, no ischemia or scar, Low Risk    GERD (gastroesophageal reflux disease)    History of echocardiogram    a. 01/2017 Echo: EF 55-60%, no rwma, mild MR, midlly dil LA, PASP 59mmHg.   History of hiatal hernia    Hyperlipidemia    Hypertension    Obesity    Past Surgical History:  Procedure Laterality Date   CATARACT EXTRACTION W/ INTRAOCULAR LENS  IMPLANT, BILATERAL Bilateral    CORONARY ANGIOPLASTY WITH STENT PLACEMENT  2011   "RCA"   CORONARY ARTERY BYPASS GRAFT  1990   CABG X4   CORONARY STENT INTERVENTION N/A 06/24/2017   Procedure: Coronary Stent Intervention;  Surgeon: Belva Crome, MD;  Location: [facility];  Service: Cardiovascular: Ostial 4.0 x 12 mm Onyx DES reducing 70% stenosis to less than 40%. Distal body 3.5 x 15 Onyx DES reducing 99% stenosis to less than 30%.   LEFT HEART CATH AND CORS/GRAFTS ANGIOGRAPHY N/A 06/24/2017   Procedure:  Left Heart Cath and Cors/Grafts Angiography;  Surgeon: Belva Crome, MD;  Location: [facility];  Service: Cardiovascular:  CTO native RCA, LAD & LCx (Graft Dependent). Patent LIMA-LCx & RIMA-LAD.  SVG-RCA: ost 65% ISR & mid 99% --> DES PCI to both   LEFT HEART CATH AND CORS/GRAFTS ANGIOGRAPHY N/A 01/04/2021   Procedure: LEFT HEART CATH AND CORS/GRAFTS ANGIOGRAPHY;  Surgeon: Belva Crome, MD;  Location: [facility];  Service: Cardiovascular;  Laterality: N/A;   Patient Active Problem List   Diagnosis Date Noted   Balance problems 01/31/2022   Right shoulder pain 01/31/2022   Dysphagia 01/31/2022   Healthcare maintenance 08/22/2021   History of non-ST elevation myocardial infarction (NSTEMI) 06/25/2017   Presence of drug coated stent in SVG-RCA 06/25/2017   Postherpetic neuralgia 01/02/2017   Tremor 05/15/2016   Irritability and anger 05/15/2016   Seborrheic keratoses 06/16/2014   Special screening for malignant neoplasm of prostate 06/14/2014   Retinal hemorrhage 07/23/2010   Impotence of organic origin 01/16/2010   Essential hypertension 04/18/2009   Hyperlipidemia with target low density lipoprotein (LDL) cholesterol less than 70 mg/dL 01/28/2007   CAD (coronary artery disease) 01/28/2007   HERNIA, HIATAL, NONCONGENITAL 01/28/2007    REFERRING DIAG:  Balance problem  THERAPY DIAG:  Balance problems  Muscle weakness  Unsteadiness on feet  Rationale for Evaluation and Treatment Rehabilitation  PERTINENT HISTORY: Heart attack with 4 bypass grafts ~1990, 2018, 2022    PRECAUTIONS: fall   SUBJECTIVE: Pt reports that he feels more steady on his feet.  He has an easier time rising from a chair.  He feels more confident on steps.  PAIN:  Are you having pain? No   OBJECTIVE: (objective measures completed at initial evaluation unless otherwise dated)    DIAGNOSTIC FINDINGS:  NA             GENERAL OBSERVATION/GAIT:                     Slow gait, wide base  of support, small step length             Vitals:           127/60, 02 sat 97, HR 53 (pt states this is typical)   SENSATION:          Light touch: Appears intact   MUSCLE LENGTH: Hamstrings: Right moderate restriction; Left moderate restriction ASLR: Right ASLR = PSLR; Left ASLR = PSLR Thomas test: Right significant restriction; Left significant restriction Ely's test: Right significant restriction; Left significant restriction   LE MMT:   MMT Right 05/22/2022 Left 05/22/2022  Hip flexion (L2, L3) 4 4  Knee extension (L3) 4+ 4+  Knee flexion 4+ 4+  Hip abduction 4 4  Hip extension 3+ 3+  Hip external rotation      Hip internal rotation      Hip adduction      Ankle dorsiflexion (L4) 4 4  Ankle plantarflexion (S1)      Ankle inversion      Ankle eversion      Great Toe ext (L5)      Grossly        (Blank rows = not tested, score listed is out of 5 possible points.  N = WNL, D = diminished, C = clear for gross weakness with myotome testing, * = concordant pain with testing)   Functional Tests   Eval (05/22/2022)      BERG BALANCE TEST Sitting to Standing: 4.      Stands without using hands and stabilize independently Standing Unsupported: 4.      Stands safely for 2 minutes Sitting Unsupported: 4.     Sits for 2 minutes independently Standing to Sitting: 2.     Uses back of legs against chair to control descent  Transfers: 4.     Transfers safely with minor use of hands Standing with eyes closed: 3.     Stands 10 seconds with supervision Standing with feet together: 4.     Stands for 1 minute safely Reaching forward with outstretched arm: 3.     Reaches forward 5 inches Retrieving object from the floor: 3.     Able to pick up with supervision Turning to look behind: 2.     Turns sideways only, maintains balance Turning 360 degrees: 2.     Able to turn slowly, but safely Place alternate foot on stool: 4.     Completes 8 steps in 20 seconds     Standing with one foot in  front: 2.     Independent small step for 30 seconds Standing on one foot: 2.     Holds >/=3 seconds   Total Score: 43/56  Tug: 13''                                                                                            PATIENT SURVEYS:  ABC scale 75.6%  HOME EXERCISE PROGRAM:   Access Code: [email or web address] URL: [email or web address] Date: 05/29/2022 Prepared by: Hayden  - 1 x daily - 7 x weekly - 2 sets - 1 min hold - Seated Hamstring Stretch  - 1 x daily - 7 x weekly - 2 sets - 70min hold - Supine Bridge  - 1 x daily - 7 x weekly - 2 sets - 10 reps - Hooklying Clamshell with Resistance  - 1 x daily - 7 x weekly - 2 sets - 10 reps - Sit to Stand  - 1 x daily - 7 x weekly - 2 sets - 10 reps - Standing Hip Extension with Counter Support  - 1 x daily - 7 x weekly - 2 sets - 10 reps - Semi-Tandem Balance at [employer] Eyes Open  - 1 x daily - 7 x weekly - 3 sets - 30 sec  hold ASTERISK SIGNS     Asterisk Signs Eval (05/22/2022) 7/19   7/21        Hip flexor tightness Signficant bil   Subtle bil         RF tightness Significant bil   significantbil         Hip extensor strength L/R 3+/3+            Balance Tandem: unable Tandem on foam for 45''           Tug 13''                  TODAY'S TREATMENT:   [facility] Adult PT Treatment:                                                DATE: 07/02/2022 Therapeutic Exercise: NuStep level 8 x 5 minutes no UE Supine modified thomas +RF EOM - 2 min ea (NT) Supine staggered bridge with emphasis on hip ext - 2x10 (NT) Sit to stand D/L with full hip ext - 3x10 - 10# - for power (NT) Step ups x 10 each fwd/lateral 8 inch  Power focus STS with blue ball throw over head Power focus flexion with blue ball slam NRE: Freight forwarder  - 2' - DF/PF Airex balance beam walking (non-tandem) Heel toe walking at counter x 2 laps  Therapeutic Activity - collecting information for  goals, checking progress, and reviewing with patient  [facility] Adult PT Treatment:                                                DATE: 06/27/2022 Therapeutic Exercise: NuStep level 8 x 5 minutes no UE Supine modified thomas +  RF EOM - 2 min ea Supine staggered bridge with emphasis on hip ext - 2x10 Sit to stand D/L with full hip ext - 3x10 - 10# - for power Step ups x 10 each lateral 6 inch  NRE: Tandem on foam - 45'' bouts Wooden rocker board  - 2' - DF/PF Airex balance beam walking (non-tandem) Heel toe walking at counter x 2 laps  [facility] Adult PT Treatment:                                                DATE: 06/24/2022 Therapeutic Exercise: NuStep level 8 x 5 minutes  Supine modified thomas +RF EOM - 2 min ea Supine bridge with emphasis on hip ext - 2x10 Sit to stand with overhead press blue med ball 2 x 10  Step ups x 10 each lateral 6 inch  NRE: Tandem on foam - 45'' bouts Wooden rocker board  - 2' - DF/PF Airex balance beam walking (non-tandem) Romberg stance on airex EC x30" Heel toe walking at counter x 2 laps  [facility] Adult PT Treatment:                                                DATE: 06/20/22 Therapeutic Exercise: NuStep level 8 x 5 minutes  Supine modified thomas +RF EOM - 2 min ea Supine bridge with emphasis on hip ext - 2x10 Sit to stand with overhead press blue med ball 2 x 10  Step ups x 10 each lateral 6 inch  NRE: Tandem on foam - 45'' bouts Wooden rocker board  - 2' - DF/PF Airex balance beam walking (non-tandem) Heel toe walking  [facility] Adult PT Treatment:                                                DATE: 06/18/22 Therapeutic Exercise: NuStep level 8 x 5 minutes  Prone quad stretch with strap x 60 second each (NT) Figure 4 stretch x 60 seconds each (NT) Sit to stand with overhead press yellow med ball 2 x 10  Hurdle step overs in // bars forward step to and reciprocal stepping 3 trials down and back each Hurdle step overs in // bars lateral 3 trials down  and back Step ups 2 x 10 each forward and lateral 4 inch   NRE: Semi tandem on foam - 45'' bouts Tandem on foam - 45'' bouts Wooden rocker board  - 2' - DF/PF  [facility] Adult PT Treatment:                                                DATE: 06/13/22 Therapeutic Exercise: NuStep level 6 x 5 minutes  Prone quad stretch with strap x 60 second each  Figure 4 stretch x 60 seconds each  Sit to stand with overhead press yellow med ball 2 x 10   Therapeutic Activity: Hurdle step overs in // bars forward step to and reciprocal stepping  3 trials down and back each Hurdle step overs in // bars lateral 3 trials down and back Step ups in // bars 1 x 10 each forward and lateral 4 inch     ASSESSMENT:   CLINICAL IMPRESSION: Neythan has progressed well with therapy.  Improved impairments include: balance, LE strength, balance.  Functional improvements include: transfers, step navigation, confidence in ambulation.  Progressions needed include: continued work on narrow base of support balance and dynamic balance.  Barriers to progress include: NA.  Please see GOALS section for progress on short term and long term goals established at evaluation.  I recommend continuation of PT to allow completion of remaining goals and continued functional progression.   OBJECTIVE IMPAIRMENTS: Pain, hip flexor tightness, HS tightness, hip ext strength, balance, gait   ACTIVITY LIMITATIONS: feeling confident navigating stairs or in community   PERSONAL FACTORS: See medical history and pertinent history     REHAB POTENTIAL: Good   CLINICAL DECISION MAKING: Stable/uncomplicated   EVALUATION COMPLEXITY: Low     GOALS:     SHORT TERM GOALS: Target date: 06/12/2022   Rooney will be >75% HEP compliant to improve carryover between sessions and facilitate independent management of condition   Evaluation (05/22/2022): ongoing Goal status: MET     LONG TERM GOALS: Target date: 07/17/2022   Parley will improve the  following MMTs to >/= 4/5 to show improvement in strength:  hip ext strength    Evaluation/Baseline (05/22/2022): see chart in note 8/2: 4/5 Goal status: MET     2.  Joud will show a >/= 15% improvement in their ABC score as a proxy for functional improvement    Evaluation/Baseline (05/22/2022): 75% confident 8/2: 85% Goal status: progressing       3.  Jaxston will imporve BERG balance score to 50 pts, to show a significant improvement in balance in order to reduce fall risk   Evaluation/Baseline (05/22/2022): 43 pts Goal status: INITIAL   See interpretation below:       4.  Brelan will be able to stand for >15'' in tandem stance, to show a significant improvement in balance in order to reduce fall risk    Evaluation/Baseline (05/22/2022): unable 7/19: 45'' tandem on foam Goal status: MET   5. Adler will express confidence in navigating 10 steps using reciprocal pattern   Evaluation/Baseline (05/22/2022): 70% confident 8/2: 12 Goal status: MET         PLAN: PT FREQUENCY: 1-2x/week   PT DURATION: 8 weeks (Ending 07/17/2022)   PLANNED INTERVENTIONS: Therapeutic exercises, Aquatic therapy, Therapeutic activity, Neuro Muscular re-education, Gait training, Patient/Family education, Joint mobilization, Dry Needling, Electrical stimulation, Spinal mobilization and/or manipulation, Moist heat, Taping, Vasopneumatic device, Ionotophoresis 4mg /ml Dexamethasone, and Manual therapy   PLAN FOR NEXT SESSION: progressive balance, hip flexor/RF/HS stretching, hip ext strengthening, pt's wife is concerned about his hip flexibility   Kevan Ny Ekta Dancer PT 07/02/22 10:44 AM

## 2022-07-02 NOTE — Telephone Encounter (Signed)
Spoke with patient's spouse, [name] (OK per DPR) to schedule lab appt for F/U BMET after starting Farxiga. [name] states she thought they already had an appt for 07/11/22. No appt seen, but went ahead and made one while on the phone.  [name] also asked about results of sleep study. Report seen on chart in Epic, will forward to Dr. Theodosia Blender nurse Carly regarding follow-up on sleep study results.

## 2022-07-03 ENCOUNTER — Telehealth: Payer: Self-pay | Admitting: *Deleted

## 2022-07-03 DIAGNOSIS — I10 Essential (primary) hypertension: Secondary | ICD-10-CM

## 2022-07-03 DIAGNOSIS — G4733 Obstructive sleep apnea (adult) (pediatric): Secondary | ICD-10-CM

## 2022-07-03 DIAGNOSIS — I25119 Atherosclerotic heart disease of native coronary artery with unspecified angina pectoris: Secondary | ICD-10-CM

## 2022-07-03 NOTE — Telephone Encounter (Signed)
-----   Message from Lauralee Evener, [place] sent at 07/01/2022  9:35 AM EDT -----  ----- Message ----- From: Sueanne Margarita, MD Sent: 06/26/2022   1:11 PM EDT To: [facility] Sleep Studies  Please let patient know that they have sleep apnea and recommend treating with CPAP.  Please order an auto CPAP from 4-15cm H2O with heated humidity and mask of choice.  Order overnight pulse ox on CPAP.  Followup with me in 6 weeks.

## 2022-07-03 NOTE — Telephone Encounter (Signed)
The patient has been notified of the result and verbalized understanding.  All questions (if any) were answered. Marolyn Hammock, [place] 07/03/2022 5:27 PM    Upon patient request DME selection is [facility]. Patient understands he will be contacted by [facility] to set up his cpap. Patient understands to call if [facility] does not contact him with new setup in a timely manner. Patient understands they will be called once confirmation has been received from [facility]/ that they have received their new machine to schedule 10 week follow up appointment.   [place] notified of new cpap order  Please add to airview Patient was grateful for the call and thanked me.

## 2022-07-04 ENCOUNTER — Encounter: Payer: Self-pay | Admitting: Physical Therapy

## 2022-07-04 ENCOUNTER — Ambulatory Visit: Payer: Medicare Other | Admitting: Physical Therapy

## 2022-07-04 DIAGNOSIS — M6281 Muscle weakness (generalized): Secondary | ICD-10-CM

## 2022-07-04 DIAGNOSIS — R2681 Unsteadiness on feet: Secondary | ICD-10-CM | POA: Diagnosis not present

## 2022-07-04 DIAGNOSIS — R2689 Other abnormalities of gait and mobility: Secondary | ICD-10-CM | POA: Diagnosis not present

## 2022-07-04 NOTE — Therapy (Signed)
Daily Note    Patient Name: Joshua [name]. MRN: [id number] DOB:1941/08/24, 81 y.o., male Today's Date: 07/04/2022  PCP: Dickie La, MD REFERRING PROVIDER: Dickie La, MD  END OF SESSION:   PT End of Session - 07/04/22 0952     Visit Number 11    Number of Visits 17    Date for PT Re-Evaluation 07/17/22    Authorization Type [employer] MCR    PT Start Time 0952    PT Stop Time 6295    PT Time Calculation (min) 40 min    Activity Tolerance Patient tolerated treatment well    Behavior During Therapy [facility] for tasks assessed/performed               Past Medical History:  Diagnosis Date   Coronary artery disease involving native coronary artery of native heart with angina pectoris ([place])    a. 1990 s/p CABG x 4 (RIMA->LAD, LIMA->LCX, VG->Diag, VG->RCA); b. 2011 s/p BMS to VG->RCA.; c. Unstable Angina 05/2017: Patent RIMA-LAD & LIMA-LCx, ostSVG-RCA 65% ISR & mSVG-RCA  99% - DES PCI to both // Myoview 05/2019:  EF 57, no ischemia or scar, Low Risk    GERD (gastroesophageal reflux disease)    History of echocardiogram    a. 01/2017 Echo: EF 55-60%, no rwma, mild MR, midlly dil LA, PASP 87mHg.   History of hiatal hernia    Hyperlipidemia    Hypertension    Obesity    Past Surgical History:  Procedure Laterality Date   CATARACT EXTRACTION W/ INTRAOCULAR LENS  IMPLANT, BILATERAL Bilateral    CORONARY ANGIOPLASTY WITH STENT PLACEMENT  2011   "RCA"   CORONARY ARTERY BYPASS GRAFT  1990   CABG X4   CORONARY STENT INTERVENTION N/A 06/24/2017   Procedure: Coronary Stent Intervention;  Surgeon: SBelva Crome MD;  [facility];  Service: Cardiovascular: Ostial 4.0 x 12 mm Onyx DES reducing 70% stenosis to less than 40%. Distal body 3.5 x 15 Onyx DES reducing 99% stenosis to less than 30%.   LEFT HEART CATH AND CORS/GRAFTS ANGIOGRAPHY N/A 06/24/2017   Procedure: Left Heart Cath and Cors/Grafts Angiography;  Surgeon: SBelva Crome MD;  [facility];   Service: Cardiovascular:  CTO native RCA, LAD & LCx (Graft Dependent). Patent LIMA-LCx & RIMA-LAD.  SVG-RCA: ost 65% ISR & mid 99% --> DES PCI to both   LEFT HEART CATH AND CORS/GRAFTS ANGIOGRAPHY N/A 01/04/2021   Procedure: LEFT HEART CATH AND CORS/GRAFTS ANGIOGRAPHY;  Surgeon: SBelva Crome MD;  [facility];  Service: Cardiovascular;  Laterality: N/A;   Patient Active Problem List   Diagnosis Date Noted   Balance problems 01/31/2022   Right shoulder pain 01/31/2022   Dysphagia 01/31/2022   Healthcare maintenance 08/22/2021   History of non-ST elevation myocardial infarction (NSTEMI) 06/25/2017   Presence of drug coated stent in SVG-RCA 06/25/2017   Postherpetic neuralgia 01/02/2017   Tremor 05/15/2016   Irritability and anger 05/15/2016   Seborrheic keratoses 06/16/2014   Special screening for malignant neoplasm of prostate 06/14/2014   Retinal hemorrhage 07/23/2010   Impotence of organic origin 01/16/2010   Essential hypertension 04/18/2009   Hyperlipidemia with target low density lipoprotein (LDL) cholesterol less than 70 mg/dL 01/28/2007   CAD (coronary artery disease) 01/28/2007   HERNIA, HIATAL, NONCONGENITAL 01/28/2007    REFERRING DIAG: Balance problem  THERAPY DIAG:  Balance problems  Muscle weakness  Unsteadiness on feet  Other abnormalities  of gait and mobility  Rationale for Evaluation and Treatment Rehabilitation  PERTINENT HISTORY: Heart attack with 4 bypass grafts ~1990, 2018, 2022    PRECAUTIONS: fall   SUBJECTIVE: Pt reports that he feels more steady on his feet.  He has an easier time rising from a chair.  He feels more confident on steps.  PAIN:  Are you having pain? No   OBJECTIVE: (objective measures completed at initial evaluation unless otherwise dated)    DIAGNOSTIC FINDINGS:  NA             GENERAL OBSERVATION/GAIT:                     Slow gait, wide base of support, small step length             Vitals:            127/60, 02 sat 97, HR 53 (pt states this is typical)   SENSATION:          Light touch: Appears intact   MUSCLE LENGTH: Hamstrings: Right moderate restriction; Left moderate restriction ASLR: Right ASLR = PSLR; Left ASLR = PSLR Thomas test: Right significant restriction; Left significant restriction Ely's test: Right significant restriction; Left significant restriction   LE MMT:   MMT Right 05/22/2022 Left 05/22/2022  Hip flexion (L2, L3) 4 4  Knee extension (L3) 4+ 4+  Knee flexion 4+ 4+  Hip abduction 4 4  Hip extension 3+ 3+  Hip external rotation      Hip internal rotation      Hip adduction      Ankle dorsiflexion (L4) 4 4  Ankle plantarflexion (S1)      Ankle inversion      Ankle eversion      Great Toe ext (L5)      Grossly        (Blank rows = not tested, score listed is out of 5 possible points.  N = WNL, D = diminished, C = clear for gross weakness with myotome testing, * = concordant pain with testing)   Functional Tests   Eval (05/22/2022)      BERG BALANCE TEST Sitting to Standing: 4.      Stands without using hands and stabilize independently Standing Unsupported: 4.      Stands safely for 2 minutes Sitting Unsupported: 4.     Sits for 2 minutes independently Standing to Sitting: 2.     Uses back of legs against chair to control descent  Transfers: 4.     Transfers safely with minor use of hands Standing with eyes closed: 3.     Stands 10 seconds with supervision Standing with feet together: 4.     Stands for 1 minute safely Reaching forward with outstretched arm: 3.     Reaches forward 5 inches Retrieving object from the floor: 3.     Able to pick up with supervision Turning to look behind: 2.     Turns sideways only, maintains balance Turning 360 degrees: 2.     Able to turn slowly, but safely Place alternate foot on stool: 4.     Completes 8 steps in 20 seconds     Standing with one foot in front: 2.     Independent small step for 30 seconds Standing  on one foot: 2.     Holds >/=3 seconds   Total Score: 43/56      Tug: 13''  PATIENT SURVEYS:  ABC scale 75.6%  HOME EXERCISE PROGRAM:   Access Code: [email or web address] URL: [email or web address] Date: 05/29/2022 Prepared by: [employer]  - 1 x daily - 7 x weekly - 2 sets - 1 min hold - Seated Hamstring Stretch  - 1 x daily - 7 x weekly - 2 sets - 9mn hold - Supine Bridge  - 1 x daily - 7 x weekly - 2 sets - 10 reps - Hooklying Clamshell with Resistance  - 1 x daily - 7 x weekly - 2 sets - 10 reps - Sit to Stand  - 1 x daily - 7 x weekly - 2 sets - 10 reps - Standing Hip Extension with Counter Support  - 1 x daily - 7 x weekly - 2 sets - 10 reps - Semi-Tandem Balance at [employer]  - 1 x daily - 7 x weekly - 3 sets - 30 sec  hold ASTERISK SIGNS     Asterisk Signs Eval (05/22/2022) 7/19   7/21        Hip flexor tightness Signficant bil   Subtle bil         RF tightness Significant bil   significantbil         Hip extensor strength L/R 3+/3+            Balance Tandem: unable Tandem on foam for 45''           Tug 13''                  TODAY'S TREATMENT:   [facility] Adult PT Treatment:                                                DATE: 07/02/2022 Therapeutic Exercise: NuStep level 9 x 5 minutes no UE Prone rec fem strech - CRC Supine S/L fig 4 bridge with emphasis on hip ext - 2x10 Step ups and down 10x ea 6'' step Power focus STS with blue ball throw over head Power focus flexion with blue ball slam NRE: WFreight forwarder - 2' - DF/PF Airex balance beam walking (non-tandem) Heel toe walking fwd and backwards at counter x 3 laps ea  [facility]                                                [facility]: 07/02/2022 Therapeutic Exercise: NuStep level 8 x 5 minutes no UE Supine modified thomas +RF EOM - 2 min ea  (NT) Supine staggered bridge with emphasis on hip ext - 2x10 (NT) Sit to stand D/L with full hip ext - 3x10 - 10# - for power (NT) Step ups x 10 each fwd/lateral 8 inch  Power focus STS with blue ball throw over head Power focus flexion with blue ball slam NRE: WFreight forwarder - 2' - DF/PF Airex balance beam walking (non-tandem) Heel toe walking at counter x 2 laps  Therapeutic Activity - collecting information for goals, checking progress, and reviewing with patient  [facility]  [facility]: [facility] Therapeutic Exercise: NuStep level 8 x 5 minutes no UE Supine modified thomas +RF EOM - 2 min ea Supine staggered bridge with emphasis on hip ext - 2x10 Sit to stand D/L with full hip ext - 3x10 - 10# - for power Step ups x 10 each lateral 6 inch  NRE: Tandem on foam - 45'' bouts Wooden rocker board  - 2' - DF/PF Airex balance beam walking (non-tandem) Heel toe walking at counter x 2 laps  [facility] Adult PT Treatment:                                                DATE: 06/24/2022 Therapeutic Exercise: NuStep level 8 x 5 minutes  Supine modified thomas +RF EOM - 2 min ea Supine bridge with emphasis on hip ext - 2x10 Sit to stand with overhead press blue med ball 2 x 10  Step ups x 10 each lateral 6 inch  NRE: Tandem on foam - 45'' bouts Wooden rocker board  - 2' - DF/PF Airex balance beam walking (non-tandem) Romberg stance on airex EC x30" Heel toe walking at counter x 2 laps  [facility] Adult PT Treatment:                                                DATE: 06/20/22 Therapeutic Exercise: NuStep level 8 x 5 minutes  Supine modified thomas +RF EOM - 2 min ea Supine bridge with emphasis on hip ext - 2x10 Sit to stand with overhead press blue med ball 2 x 10  Step ups x 10 each lateral 6 inch  NRE: Tandem on foam - 45'' bouts Wooden rocker board  - 2' - DF/PF Airex balance beam walking (non-tandem) Heel toe walking  [facility]  Adult PT Treatment:                                                DATE: 06/18/22 Therapeutic Exercise: NuStep level 8 x 5 minutes  Prone quad stretch with strap x 60 second each (NT) Figure 4 stretch x 60 seconds each (NT) Sit to stand with overhead press yellow med ball 2 x 10  Hurdle step overs in // bars forward step to and reciprocal stepping 3 trials down and back each Hurdle step overs in // bars lateral 3 trials down and back Step ups 2 x 10 each forward and lateral 4 inch   NRE: Semi tandem on foam - 45'' bouts Tandem on foam - 45'' bouts Wooden rocker board  - 2' - DF/PF  [facility] Adult PT Treatment:                                                DATE: 06/13/22 Therapeutic Exercise: NuStep level 6 x 5 minutes  Prone quad stretch with strap x 60 second each  Figure 4 stretch x 60 seconds each  Sit to stand with overhead press yellow med ball 2 x 10  Therapeutic Activity: Hurdle step overs in // bars forward step to and reciprocal stepping 3 trials down and back each Hurdle step overs in // bars lateral 3 trials down and back Step ups in // bars 1 x 10 each forward and lateral 4 inch     ASSESSMENT:   CLINICAL IMPRESSION: Skip tolerated session well with no adverse reaction.  He was able to progress to S/L bridge indicating improved core and hip strength.  We continue to integrate narrow BOS functional balance exercises to good effect.  He is significantly more stable and confident in tandem walking than he was a few sessions ago.   OBJECTIVE IMPAIRMENTS: Pain, hip flexor tightness, HS tightness, hip ext strength, balance, gait   ACTIVITY LIMITATIONS: feeling confident navigating stairs or in community   PERSONAL FACTORS: See medical history and pertinent history     REHAB POTENTIAL: Good   CLINICAL DECISION MAKING: Stable/uncomplicated   EVALUATION COMPLEXITY: Low     GOALS:     SHORT TERM GOALS: Target date: 06/12/2022   Zebbie will be >75% HEP compliant to  improve carryover between sessions and facilitate independent management of condition   Evaluation (05/22/2022): ongoing Goal status: MET     LONG TERM GOALS: Target date: 07/17/2022   Job will improve the following MMTs to >/= 4/5 to show improvement in strength:  hip ext strength    Evaluation/Baseline (05/22/2022): see chart in note 8/2: 4/5 Goal status: MET     2.  Daevion will show a >/= 15% improvement in their ABC score as a proxy for functional improvement    Evaluation/Baseline (05/22/2022): 75% confident 8/2: 85% Goal status: progressing       3.  [place] will imporve BERG balance score to 50 pts, to show a significant improvement in balance in order to reduce fall risk   Evaluation/Baseline (05/22/2022): 43 pts Goal status: INITIAL   See interpretation below:       4.  [place] will be able to stand for >15'' in tandem stance, to show a significant improvement in balance in order to reduce fall risk    Evaluation/Baseline (05/22/2022): unable 7/19: 45'' tandem on foam Goal status: MET   5. Xayvion will express confidence in navigating 10 steps using reciprocal pattern   Evaluation/Baseline (05/22/2022): 70% confident 8/2: 12 Goal status: MET         PLAN: PT FREQUENCY: 1-2x/week   PT DURATION: 8 weeks (Ending 07/17/2022)   PLANNED INTERVENTIONS: Therapeutic exercises, Aquatic therapy, Therapeutic activity, Neuro Muscular re-education, Gait training, Patient/Family education, Joint mobilization, Dry Needling, Electrical stimulation, Spinal mobilization and/or manipulation, Moist heat, Taping, Vasopneumatic device, Ionotophoresis 62m/ml Dexamethasone, and Manual therapy   PLAN FOR NEXT SESSION: progressive balance, hip flexor/RF/HS stretching, hip ext strengthening, pt's wife is concerned about his hip flexibility   KKevan NyReinhartsen PT 07/04/22 10:34 AM

## 2022-07-07 NOTE — Telephone Encounter (Signed)
The patient has been notified of the result and verbalized understanding.  All questions (if any) were answered. Marolyn Hammock, [place] 07/03/2022 5:27 PM

## 2022-07-09 ENCOUNTER — Ambulatory Visit: Payer: Medicare Other | Admitting: Physical Therapy

## 2022-07-09 ENCOUNTER — Encounter: Payer: Self-pay | Admitting: Physical Therapy

## 2022-07-09 DIAGNOSIS — R2681 Unsteadiness on feet: Secondary | ICD-10-CM

## 2022-07-09 DIAGNOSIS — R2689 Other abnormalities of gait and mobility: Secondary | ICD-10-CM | POA: Diagnosis not present

## 2022-07-09 DIAGNOSIS — M6281 Muscle weakness (generalized): Secondary | ICD-10-CM | POA: Diagnosis not present

## 2022-07-09 NOTE — Therapy (Signed)
Daily Note    Patient Name: Joshua [name]. MRN: [id number] DOB:01/13/1941, 81 y.o., male Today's Date: 07/09/2022  PCP: Dickie La, MD REFERRING PROVIDER: Dickie La, MD  END OF SESSION:   Joshua [name] End of Session - 07/09/22 1000     Visit Number 12    Number of Visits 17    Date for Joshua [name] Re-Evaluation 07/17/22    Authorization Type [employer] MCR    Joshua [name] Start Time 1000    Joshua [name] Stop Time 7322    Joshua [name] Time Calculation (min) 40 min    Activity Tolerance Patient tolerated treatment well    Behavior During Therapy WFL for tasks assessed/performed               Past Medical History:  Diagnosis Date   Coronary artery disease involving native coronary artery of native heart with angina pectoris ([place])    a. 1990 s/p CABG x 4 (RIMA->LAD, LIMA->LCX, VG->Diag, VG->RCA); b. 2011 s/p BMS to VG->RCA.; c. Unstable Angina 05/2017: Patent RIMA-LAD & LIMA-LCx, ostSVG-RCA 65% ISR & mSVG-RCA  99% - DES PCI to both // Myoview 05/2019:  EF 57, no ischemia or scar, Low Risk    GERD (gastroesophageal reflux disease)    History of echocardiogram    a. 01/2017 Echo: EF 55-60%, no rwma, mild MR, midlly dil LA, PASP 69mHg.   History of hiatal hernia    Hyperlipidemia    Hypertension    Obesity    Past Surgical History:  Procedure Laterality Date   CATARACT EXTRACTION W/ INTRAOCULAR LENS  IMPLANT, BILATERAL Bilateral    CORONARY ANGIOPLASTY WITH STENT PLACEMENT  2011   "RCA"   CORONARY ARTERY BYPASS GRAFT  1990   CABG X4   CORONARY STENT INTERVENTION N/A 06/24/2017   Procedure: Coronary Stent Intervention;  Surgeon: SBelva Crome MD;  [facility];  Service: Cardiovascular: Ostial 4.0 x 12 mm Onyx DES reducing 70% stenosis to less than 40%. Distal body 3.5 x 15 Onyx DES reducing 99% stenosis to less than 30%.   LEFT HEART CATH AND CORS/GRAFTS ANGIOGRAPHY N/A 06/24/2017   Procedure: Left Heart Cath and Cors/Grafts Angiography;  Surgeon: SBelva Crome MD;  [facility];   Service: Cardiovascular:  CTO native RCA, LAD & LCx (Graft Dependent). Patent LIMA-LCx & RIMA-LAD.  SVG-RCA: ost 65% ISR & mid 99% --> DES PCI to both   LEFT HEART CATH AND CORS/GRAFTS ANGIOGRAPHY N/A 01/04/2021   Procedure: LEFT HEART CATH AND CORS/GRAFTS ANGIOGRAPHY;  Surgeon: SBelva Crome MD;  [facility];  Service: Cardiovascular;  Laterality: N/A;   Patient Active Problem List   Diagnosis Date Noted   Balance problems 01/31/2022   Right shoulder pain 01/31/2022   Dysphagia 01/31/2022   Healthcare maintenance 08/22/2021   History of non-ST elevation myocardial infarction (NSTEMI) 06/25/2017   Presence of drug coated stent in SVG-RCA 06/25/2017   Postherpetic neuralgia 01/02/2017   Tremor 05/15/2016   Irritability and anger 05/15/2016   Seborrheic keratoses 06/16/2014   Special screening for malignant neoplasm of prostate 06/14/2014   Retinal hemorrhage 07/23/2010   Impotence of organic origin 01/16/2010   Essential hypertension 04/18/2009   Hyperlipidemia with target low density lipoprotein (LDL) cholesterol less than 70 mg/dL 01/28/2007   CAD (coronary artery disease) 01/28/2007   HERNIA, HIATAL, NONCONGENITAL 01/28/2007    REFERRING DIAG: Balance problem  THERAPY DIAG:  Balance problems  Muscle weakness  Unsteadiness on feet  Rationale for  Evaluation and Treatment Rehabilitation  PERTINENT HISTORY: Heart attack with 4 bypass grafts ~1990, 2018, 2022    PRECAUTIONS: fall   SUBJECTIVE:  Joshua [name] reports he has been feeling good and feels he will be ready for D/C next visit.  PAIN:  Are you having pain? No   OBJECTIVE: (objective measures completed at initial evaluation unless otherwise dated)    DIAGNOSTIC FINDINGS:  NA             GENERAL OBSERVATION/GAIT:                     Slow gait, wide base of support, small step length             Vitals:           127/60, 02 sat 97, HR 53 (Joshua [name] states this is typical)   SENSATION:          Light  touch: Appears intact   MUSCLE LENGTH: Hamstrings: Right moderate restriction; Left moderate restriction ASLR: Right ASLR = PSLR; Left ASLR = PSLR Thomas test: Right significant restriction; Left significant restriction Ely's test: Right significant restriction; Left significant restriction   LE MMT:   MMT Right 05/22/2022 Left 05/22/2022  Hip flexion (L2, L3) 4 4  Knee extension (L3) 4+ 4+  Knee flexion 4+ 4+  Hip abduction 4 4  Hip extension 3+ 3+  Hip external rotation      Hip internal rotation      Hip adduction      Ankle dorsiflexion (L4) 4 4  Ankle plantarflexion (S1)      Ankle inversion      Ankle eversion      Great Toe ext (L5)      Grossly        (Blank rows = not tested, score listed is out of 5 possible points.  N = WNL, D = diminished, C = clear for gross weakness with myotome testing, * = concordant pain with testing)   Functional Tests   Eval (05/22/2022)      BERG BALANCE TEST Sitting to Standing: 4.      Stands without using hands and stabilize independently Standing Unsupported: 4.      Stands safely for 2 minutes Sitting Unsupported: 4.     Sits for 2 minutes independently Standing to Sitting: 2.     Uses back of legs against chair to control descent  Transfers: 4.     Transfers safely with minor use of hands Standing with eyes closed: 3.     Stands 10 seconds with supervision Standing with feet together: 4.     Stands for 1 minute safely Reaching forward with outstretched arm: 3.     Reaches forward 5 inches Retrieving object from the floor: 3.     Able to pick up with supervision Turning to look behind: 2.     Turns sideways only, maintains balance Turning 360 degrees: 2.     Able to turn slowly, but safely Place alternate foot on stool: 4.     Completes 8 steps in 20 seconds     Standing with one foot in front: 2.     Independent small step for 30 seconds Standing on one foot: 2.     Holds >/=3 seconds   Total Score: 43/56      Tug: 13''  PATIENT SURVEYS:  ABC scale 75.6%  HOME EXERCISE PROGRAM:   Access Code: [email or web address] URL: [email or web address] Date: 05/29/2022 Prepared by: [place]  - 1 x daily - 7 x weekly - 2 sets - 1 min hold - Seated Hamstring Stretch  - 1 x daily - 7 x weekly - 2 sets - 4mn hold - Supine Bridge  - 1 x daily - 7 x weekly - 2 sets - 10 reps - Hooklying Clamshell with Resistance  - 1 x daily - 7 x weekly - 2 sets - 10 reps - Sit to Stand  - 1 x daily - 7 x weekly - 2 sets - 10 reps - Standing Hip Extension with Counter Support  - 1 x daily - 7 x weekly - 2 sets - 10 reps - Semi-Tandem Balance at [employer]  - 1 x daily - 7 x weekly - 3 sets - 30 sec  hold ASTERISK SIGNS     Asterisk Signs Eval (05/22/2022) 7/19   7/21        Hip flexor tightness Signficant bil   Subtle bil         RF tightness Significant bil   significantbil         Hip extensor strength L/R 3+/3+            Balance Tandem: unable Tandem on foam for 45''           Tug 13''                  TODAY'S TREATMENT:  [facility] Adult Joshua [name] Treatment:                                                DATE: 07/09/2022 Therapeutic Exercise: NuStep level 9 x 5 minutes no UE Prone rec fem strech - CRC Supine S/L fig 4 bridge with emphasis on hip ext - 2x10 Step ups and down 10x ea 6'' step -lat   Power focus STS with orange ball throw over head Power focus flexion with orange ball slam NRE: WFreight forwarder - 2' - DF/PF Airex balance beam walking (non-tandem) Heel toe walking fwd and backwards at counter x 3 laps ea  [name]                                                [facility]: 07[facility] Therapeutic Exercise: NuStep level 9 x 5 minutes no UE Prone rec fem strech - CRC Supine S/L fig 4 bridge with emphasis on hip ext - 2x10 Step ups and down 10x ea 6''  step Power focus STS with blue ball throw over head Power focus flexion with blue ball slam NRE: WFreight forwarder - 2' - DF/PF Airex balance beam walking (non-tandem) Heel toe walking fwd and backwards at counter x 3 laps ea  [name]                                                DATE: 07/02/2022 Therapeutic Exercise:  NuStep level 8 x 5 minutes no UE Supine modified thomas +RF EOM - 2 min ea (NT) Supine staggered bridge with emphasis on hip ext - 2x10 (NT) Sit to stand D/L with full hip ext - 3x10 - 10# - for power (NT) Step ups x 10 each fwd/lateral 8 inch  Power focus STS with blue ball throw over head Power focus flexion with blue ball slam NRE: Freight forwarder  - 2' - DF/PF Airex balance beam walking (non-tandem) Heel toe walking at counter x 2 laps  Therapeutic Activity - collecting information for goals, checking progress, and reviewing with patient  [facility] Adult Joshua [name] Treatment:                                                DATE: 06/27/2022 Therapeutic Exercise: NuStep level 8 x 5 minutes no UE Supine modified thomas +RF EOM - 2 min ea Supine staggered bridge with emphasis on hip ext - 2x10 Sit to stand D/L with full hip ext - 3x10 - 10# - for power Step ups x 10 each lateral 6 inch  NRE: Tandem on foam - 45'' bouts Wooden rocker board  - 2' - DF/PF Airex balance beam walking (non-tandem) Heel toe walking at counter x 2 laps  [facility] Adult Joshua [name] Treatment:                                                DATE: 06/24/2022 Therapeutic Exercise: NuStep level 8 x 5 minutes  Supine modified thomas +RF EOM - 2 min ea Supine bridge with emphasis on hip ext - 2x10 Sit to stand with overhead press blue med ball 2 x 10  Step ups x 10 each lateral 6 inch  NRE: Tandem on foam - 45'' bouts Wooden rocker board  - 2' - DF/PF Airex balance beam walking (non-tandem) Romberg stance on airex EC x30" Heel toe walking at counter x 2 laps  [facility] Adult Joshua [name] Treatment:                                                 DATE: 06/20/22 Therapeutic Exercise: NuStep level 8 x 5 minutes  Supine modified thomas +RF EOM - 2 min ea Supine bridge with emphasis on hip ext - 2x10 Sit to stand with overhead press blue med ball 2 x 10  Step ups x 10 each lateral 6 inch  NRE: Tandem on foam - 45'' bouts Wooden rocker board  - 2' - DF/PF Airex balance beam walking (non-tandem) Heel toe walking  [facility] Adult Joshua [name] Treatment:                                                DATE: 06/18/22 Therapeutic Exercise: NuStep level 8 x 5 minutes  Prone quad stretch with strap x 60 second each (NT) Figure 4 stretch x 60 seconds each (NT) Sit to stand with overhead press yellow med ball 2 x 10  Hurdle  step overs in // bars forward step to and reciprocal stepping 3 trials down and back each Hurdle step overs in // bars lateral 3 trials down and back Step ups 2 x 10 each forward and lateral 4 inch   NRE: Semi tandem on foam - 45'' bouts Tandem on foam - 45'' bouts Wooden rocker board  - 2' - DF/PF  [facility] Adult Joshua [name] Treatment:                                                DATE: 06/13/22 Therapeutic Exercise: NuStep level 6 x 5 minutes  Prone quad stretch with strap x 60 second each  Figure 4 stretch x 60 seconds each  Sit to stand with overhead press yellow med ball 2 x 10   Therapeutic Activity: Hurdle step overs in // bars forward step to and reciprocal stepping 3 trials down and back each Hurdle step overs in // bars lateral 3 trials down and back Step ups in // bars 1 x 10 each forward and lateral 4 inch     ASSESSMENT:   CLINICAL IMPRESSION: Skip tolerated session well with no adverse reaction.  He continues to make progress with LE and core strength exercises and balance.  We will plan on D/C next visit.   OBJECTIVE IMPAIRMENTS: Pain, hip flexor tightness, HS tightness, hip ext strength, balance, gait   ACTIVITY LIMITATIONS: feeling confident navigating stairs or in community    PERSONAL FACTORS: See medical history and pertinent history     REHAB POTENTIAL: Good   CLINICAL DECISION MAKING: Stable/uncomplicated   EVALUATION COMPLEXITY: Low     GOALS:     SHORT TERM GOALS: Target date: 06/12/2022   Joshua [name] will be >75% HEP compliant to improve carryover between sessions and facilitate independent management of condition   Evaluation (05/22/2022): ongoing Goal status: MET     LONG TERM GOALS: Target date: 07/17/2022   Joshua [name] will improve the following MMTs to >/= 4/5 to show improvement in strength:  hip ext strength    Evaluation/Baseline (05/22/2022): see chart in note 8/2: 4/5 Goal status: MET     2.  Joshua [name] will show a >/= 15% improvement in their ABC score as a proxy for functional improvement    Evaluation/Baseline (05/22/2022): 75% confident 8/2: 85% Goal status: progressing       3.  Joshua [name] will imporve BERG balance score to 50 pts, to show a significant improvement in balance in order to reduce fall risk   Evaluation/Baseline (05/22/2022): 43 pts Goal status: INITIAL   See interpretation below:       4.  Joshua [name] will be able to stand for >15'' in tandem stance, to show a significant improvement in balance in order to reduce fall risk    Evaluation/Baseline (05/22/2022): unable 7/19: 45'' tandem on foam Goal status: MET   5. Joshua [name] will express confidence in navigating 10 steps using reciprocal pattern   Evaluation/Baseline (05/22/2022): 70% confident 8/2: 12 Goal status: MET         PLAN: Joshua [name] FREQUENCY: 1-2x/week   Joshua [name] DURATION: 8 weeks (Ending 07/17/2022)   PLANNED INTERVENTIONS: Therapeutic exercises, Aquatic therapy, Therapeutic activity, Neuro Muscular re-education, Gait training, Patient/Family education, Joint mobilization, Dry Needling, Electrical stimulation, Spinal mobilization and/or manipulation, Moist heat, Taping, Vasopneumatic device, Ionotophoresis 21m/ml Dexamethasone, and Manual therapy   PLAN FOR NEXT  SESSION:  progressive balance, hip flexor/RF/HS stretching, hip ext strengthening, Joshua [name]'s wife is concerned about his hip flexibility   Kevan Ny Taelor Waymire Joshua [name] 07/09/22 10:43 AM

## 2022-07-11 ENCOUNTER — Ambulatory Visit: Payer: Medicare Other | Admitting: Physical Therapy

## 2022-07-11 ENCOUNTER — Other Ambulatory Visit: Payer: Medicare Other

## 2022-07-11 ENCOUNTER — Encounter: Payer: Self-pay | Admitting: Physical Therapy

## 2022-07-11 DIAGNOSIS — M6281 Muscle weakness (generalized): Secondary | ICD-10-CM | POA: Diagnosis not present

## 2022-07-11 DIAGNOSIS — R2689 Other abnormalities of gait and mobility: Secondary | ICD-10-CM | POA: Diagnosis not present

## 2022-07-11 DIAGNOSIS — I10 Essential (primary) hypertension: Secondary | ICD-10-CM | POA: Diagnosis not present

## 2022-07-11 DIAGNOSIS — R2681 Unsteadiness on feet: Secondary | ICD-10-CM | POA: Diagnosis not present

## 2022-07-11 LAB — BASIC METABOLIC PANEL
BUN/Creatinine Ratio: 14 (ref 10–24)
BUN: 16 mg/dL (ref 8–27)
CO2: 22 mmol/L (ref 20–29)
Calcium: 9.1 mg/dL (ref 8.6–10.2)
Chloride: 100 mmol/L (ref 96–106)
Creatinine, Ser: 1.13 mg/dL (ref 0.76–1.27)
Glucose: 95 mg/dL (ref 70–99)
Potassium: 4.1 mmol/L (ref 3.5–5.2)
Sodium: 139 mmol/L (ref 134–144)
eGFR: 67 mL/min/{1.73_m2} (ref >59)

## 2022-07-11 NOTE — Therapy (Signed)
Daily Note    Patient Name: Joshua [name]. MRN: [id number] DOB:[birth year], 81 y.o., male Today's Date: 07/11/2022  PCP: Dickie La, MD REFERRING PROVIDER: Dickie La, MD  END OF SESSION:   PT End of Session - 07/11/22 0956     Visit Number 13    Number of Visits 17    Date for PT Re-Evaluation 07/17/22    Authorization Type [employer] MCR    PT Start Time 1000    PT Stop Time 1040    PT Time Calculation (min) 40 min    Activity Tolerance Patient tolerated treatment well    Behavior During Therapy WFL for tasks assessed/performed               Past Medical History:  Diagnosis Date   Coronary artery disease involving native coronary artery of native heart with angina pectoris ([place])    a. 1990 s/p CABG x 4 (RIMA->LAD, LIMA->LCX, VG->Diag, VG->RCA); b. 2011 s/p BMS to VG->RCA.; c. Unstable Angina 05/2017: Patent RIMA-LAD & LIMA-LCx, ostSVG-RCA 65% ISR & mSVG-RCA  99% - DES PCI to both // Myoview 05/2019:  EF 57, no ischemia or scar, Low Risk    GERD (gastroesophageal reflux disease)    History of echocardiogram    a. 01/2017 Echo: EF 55-60%, no rwma, mild MR, midlly dil LA, PASP 28mmHg.   History of hiatal hernia    Hyperlipidemia    Hypertension    Obesity    Past Surgical History:  Procedure Laterality Date   CATARACT EXTRACTION W/ INTRAOCULAR LENS  IMPLANT, BILATERAL Bilateral    CORONARY ANGIOPLASTY WITH STENT PLACEMENT  2011   "RCA"   CORONARY ARTERY BYPASS GRAFT  1990   CABG X4   CORONARY STENT INTERVENTION N/A 06/24/2017   Procedure: Coronary Stent Intervention;  Surgeon: Belva Crome, MD;  Location: [facility];  Service: Cardiovascular: Ostial 4.0 x 12 mm Onyx DES reducing 70% stenosis to less than 40%. Distal body 3.5 x 15 Onyx DES reducing 99% stenosis to less than 30%.   LEFT HEART CATH AND CORS/GRAFTS ANGIOGRAPHY N/A 06/24/2017   Procedure: Left Heart Cath and Cors/Grafts Angiography;  Surgeon: Belva Crome, MD;  Location: [facility];   Service: Cardiovascular:  CTO native RCA, LAD & LCx (Graft Dependent). Patent LIMA-LCx & RIMA-LAD.  SVG-RCA: ost 65% ISR & mid 99% --> DES PCI to both   LEFT HEART CATH AND CORS/GRAFTS ANGIOGRAPHY N/A 01/04/2021   Procedure: LEFT HEART CATH AND CORS/GRAFTS ANGIOGRAPHY;  Surgeon: Belva Crome, MD;  Location: [facility];  Service: Cardiovascular;  Laterality: N/A;   Patient Active Problem List   Diagnosis Date Noted   Balance problems 01/31/2022   Right shoulder pain 01/31/2022   Dysphagia 01/31/2022   Healthcare maintenance 08/22/2021   History of non-ST elevation myocardial infarction (NSTEMI) 06/25/2017   Presence of drug coated stent in SVG-RCA 06/25/2017   Postherpetic neuralgia 01/02/2017   Tremor 05/15/2016   Irritability and anger 05/15/2016   Seborrheic keratoses 06/16/2014   Special screening for malignant neoplasm of prostate 06/14/2014   Retinal hemorrhage 07/23/2010   Impotence of organic origin 01/16/2010   Essential hypertension 04/18/2009   Hyperlipidemia with target low density lipoprotein (LDL) cholesterol less than 70 mg/dL 01/28/2007   CAD (coronary artery disease) 01/28/2007   HERNIA, HIATAL, NONCONGENITAL 01/28/2007    REFERRING DIAG: Balance problem  THERAPY DIAG:  Balance problems  Muscle weakness  Unsteadiness on feet  Other abnormalities  of gait and mobility  Rationale for Evaluation and Treatment Rehabilitation  PERTINENT HISTORY: Heart attack with 4 bypass grafts ~1990, 2018, 2022    PRECAUTIONS: fall   SUBJECTIVE:  Pt reports that he sees continued improvement in has balance.  D/t a scheduling confusion he will be D/C'd next week and feels he will be ready for this.  PAIN:  Are you having pain? No   OBJECTIVE: (objective measures completed at initial evaluation unless otherwise dated)    DIAGNOSTIC FINDINGS:  NA             GENERAL OBSERVATION/GAIT:                     Slow gait, wide base of support, small step length              Vitals:           127/60, 02 sat 97, HR 53 (pt states this is typical)   SENSATION:          Light touch: Appears intact   MUSCLE LENGTH: Hamstrings: Right moderate restriction; Left moderate restriction ASLR: Right ASLR = PSLR; Left ASLR = PSLR Thomas test: Right significant restriction; Left significant restriction Ely's test: Right significant restriction; Left significant restriction   LE MMT:   MMT Right 05/22/2022 Left 05/22/2022  Hip flexion (L2, L3) 4 4  Knee extension (L3) 4+ 4+  Knee flexion 4+ 4+  Hip abduction 4 4  Hip extension 3+ 3+  Hip external rotation      Hip internal rotation      Hip adduction      Ankle dorsiflexion (L4) 4 4  Ankle plantarflexion (S1)      Ankle inversion      Ankle eversion      Great Toe ext (L5)      Grossly        (Blank rows = not tested, score listed is out of 5 possible points.  N = WNL, D = diminished, C = clear for gross weakness with myotome testing, * = concordant pain with testing)   Functional Tests   Eval (05/22/2022)      BERG BALANCE TEST Sitting to Standing: 4.      Stands without using hands and stabilize independently Standing Unsupported: 4.      Stands safely for 2 minutes Sitting Unsupported: 4.     Sits for 2 minutes independently Standing to Sitting: 2.     Uses back of legs against chair to control descent  Transfers: 4.     Transfers safely with minor use of hands Standing with eyes closed: 3.     Stands 10 seconds with supervision Standing with feet together: 4.     Stands for 1 minute safely Reaching forward with outstretched arm: 3.     Reaches forward 5 inches Retrieving object from the floor: 3.     Able to pick up with supervision Turning to look behind: 2.     Turns sideways only, maintains balance Turning 360 degrees: 2.     Able to turn slowly, but safely Place alternate foot on stool: 4.     Completes 8 steps in 20 seconds     Standing with one foot in front: 2.     Independent small step  for 30 seconds Standing on one foot: 2.     Holds >/=3 seconds   Total Score: 43/56      Tug: 13''  PATIENT SURVEYS:  ABC scale 75.6%  HOME EXERCISE PROGRAM:   Access Code: [email or web address] URL: [email or web address] Date: 05/29/2022 Prepared by: [place]  - 1 x daily - 7 x weekly - 2 sets - 1 min hold - Seated Hamstring Stretch  - 1 x daily - 7 x weekly - 2 sets - 60min hold - Supine Bridge  - 1 x daily - 7 x weekly - 2 sets - 10 reps - Hooklying Clamshell with Resistance  - 1 x daily - 7 x weekly - 2 sets - 10 reps - Sit to Stand  - 1 x daily - 7 x weekly - 2 sets - 10 reps - Standing Hip Extension with Counter Support  - 1 x daily - 7 x weekly - 2 sets - 10 reps - Semi-Tandem Balance at [employer] Eyes Open  - 1 x daily - 7 x weekly - 3 sets - 30 sec  hold ASTERISK SIGNS     Asterisk Signs Eval (05/22/2022) 7/19   7/21        Hip flexor tightness Signficant bil   Subtle bil         RF tightness Significant bil   significantbil         Hip extensor strength L/R 3+/3+            Balance Tandem: unable Tandem on foam for 45''           Tug 13''                  TODAY'S TREATMENT:   [facility] Adult PT Treatment:                                                DATE: 07/11/2022 Therapeutic Exercise: NuStep level 9 x 5 minutes no UE Prone rec fem strech - CRC Supine S/L fig 4 bridge with emphasis on hip ext - 2x10 Step ups and down 10x ea 6'' step  Power focus STS with orange ball throw over head Power focus flexion with orange ball slam NRE: Freight forwarder  - 2' - DF/PF Airex balance beam walking (non-tandem) Heel toe walking fwd and backwards at counter x 3 laps ea  [facility] Adult PT Treatment:                                                DATE: 07/09/2022 Therapeutic Exercise: NuStep level 9 x 5 minutes no UE Prone rec fem  strech - CRC Supine S/L fig 4 bridge with emphasis on hip ext - 2x10 Step ups and down 10x ea 6'' step -lat   Power focus STS with orange ball throw over head Power focus flexion with orange ball slam NRE: Freight forwarder  - 2' - DF/PF Airex balance beam walking (non-tandem) Heel toe walking fwd and backwards at counter x 3 laps ea  [facility] Adult PT Treatment:                                                DATE: 07/02/2022  Therapeutic Exercise: NuStep level 9 x 5 minutes no UE Prone rec fem strech - CRC Supine S/L fig 4 bridge with emphasis on hip ext - 2x10 Step ups and down 10x ea 6'' step Power focus STS with blue ball throw over head Power focus flexion with blue ball slam NRE: Freight forwarder  - 2' - DF/PF Airex balance beam walking (non-tandem) Heel toe walking fwd and backwards at counter x 3 laps ea  [facility] Adult PT Treatment:                                                DATE: 07/02/2022 Therapeutic Exercise: NuStep level 8 x 5 minutes no UE Supine modified thomas +RF EOM - 2 min ea (NT) Supine staggered bridge with emphasis on hip ext - 2x10 (NT) Sit to stand D/L with full hip ext - 3x10 - 10# - for power (NT) Step ups x 10 each fwd/lateral 8 inch  Power focus STS with blue ball throw over head Power focus flexion with blue ball slam NRE: Freight forwarder  - 2' - DF/PF Airex balance beam walking (non-tandem) Heel toe walking at counter x 2 laps  Therapeutic Activity - collecting information for goals, checking progress, and reviewing with patient  [facility] Adult PT Treatment:                                                DATE: 06/27/2022 Therapeutic Exercise: NuStep level 8 x 5 minutes no UE Supine modified thomas +RF EOM - 2 min ea Supine staggered bridge with emphasis on hip ext - 2x10 Sit to stand D/L with full hip ext - 3x10 - 10# - for power Step ups x 10 each lateral 6 inch  NRE: Tandem on foam - 45'' bouts Wooden rocker board  - 2' - DF/PF Airex  balance beam walking (non-tandem) Heel toe walking at counter x 2 laps  [facility] Adult PT Treatment:                                                DATE: 06/24/2022 Therapeutic Exercise: NuStep level 8 x 5 minutes  Supine modified thomas +RF EOM - 2 min ea Supine bridge with emphasis on hip ext - 2x10 Sit to stand with overhead press blue med ball 2 x 10  Step ups x 10 each lateral 6 inch  NRE: Tandem on foam - 45'' bouts Wooden rocker board  - 2' - DF/PF Airex balance beam walking (non-tandem) Romberg stance on airex EC x30" Heel toe walking at counter x 2 laps  [facility] Adult PT Treatment:                                                DATE: 06/20/22 Therapeutic Exercise: NuStep level 8 x 5 minutes  Supine modified thomas +RF EOM - 2 min ea Supine bridge with emphasis on hip ext - 2x10 Sit to stand with overhead press  blue med ball 2 x 10  Step ups x 10 each lateral 6 inch  NRE: Tandem on foam - 45'' bouts Wooden rocker board  - 2' - DF/PF Airex balance beam walking (non-tandem) Heel toe walking  [facility] Adult PT Treatment:                                                DATE: 06/18/22 Therapeutic Exercise: NuStep level 8 x 5 minutes  Prone quad stretch with strap x 60 second each (NT) Figure 4 stretch x 60 seconds each (NT) Sit to stand with overhead press yellow med ball 2 x 10  Hurdle step overs in // bars forward step to and reciprocal stepping 3 trials down and back each Hurdle step overs in // bars lateral 3 trials down and back Step ups 2 x 10 each forward and lateral 4 inch   NRE: Semi tandem on foam - 45'' bouts Tandem on foam - 45'' bouts Wooden rocker board  - 2' - DF/PF  [facility] Adult PT Treatment:                                                DATE: 06/13/22 Therapeutic Exercise: NuStep level 6 x 5 minutes  Prone quad stretch with strap x 60 second each  Figure 4 stretch x 60 seconds each  Sit to stand with overhead press yellow med ball 2 x 10   Therapeutic  Activity: Hurdle step overs in // bars forward step to and reciprocal stepping 3 trials down and back each Hurdle step overs in // bars lateral 3 trials down and back Step ups in // bars 1 x 10 each forward and lateral 4 inch     ASSESSMENT:   CLINICAL IMPRESSION: Joshua [name] continues to do well with therapy.  Activities kept the same as last visit as the are adequately challenging.  He continues to improve comfort and stability with walking in narrow base of support on unstable and stable surfaces.  Next visit we will work on robust HEP and D/C planning   OBJECTIVE IMPAIRMENTS: Pain, hip flexor tightness, HS tightness, hip ext strength, balance, gait   ACTIVITY LIMITATIONS: feeling confident navigating stairs or in community   PERSONAL FACTORS: See medical history and pertinent history     REHAB POTENTIAL: Good   CLINICAL DECISION MAKING: Stable/uncomplicated   EVALUATION COMPLEXITY: Low     GOALS:     SHORT TERM GOALS: Target date: 06/12/2022   Joshua [name] will be >75% HEP compliant to improve carryover between sessions and facilitate independent management of condition   Evaluation (05/22/2022): ongoing Goal status: MET     LONG TERM GOALS: Target date: 07/17/2022   Joshua [name] will improve the following MMTs to >/= 4/5 to show improvement in strength:  hip ext strength    Evaluation/Baseline (05/22/2022): see chart in note 8/2: 4/5 Goal status: MET     2.  Joshua [name] will show a >/= 15% improvement in their ABC score as a proxy for functional improvement    Evaluation/Baseline (05/22/2022): 75% confident 8/2: 85% Goal status: progressing       3.  Joshua [name] will imporve BERG balance score to 50 pts, to show a significant improvement in balance  in order to reduce fall risk   Evaluation/Baseline (05/22/2022): 43 pts Goal status: INITIAL   See interpretation below:       4.  Joshua [name] will be able to stand for >15'' in tandem stance, to show a significant improvement in balance in  order to reduce fall risk    Evaluation/Baseline (05/22/2022): unable 7/19: 45'' tandem on foam Goal status: MET   5. Joshua [name] will express confidence in navigating 10 steps using reciprocal pattern   Evaluation/Baseline (05/22/2022): 70% confident 8/2: 12 Goal status: MET         PLAN: PT FREQUENCY: 1-2x/week   PT DURATION: 8 weeks (Ending 07/17/2022)   PLANNED INTERVENTIONS: Therapeutic exercises, Aquatic therapy, Therapeutic activity, Neuro Muscular re-education, Gait training, Patient/Family education, Joint mobilization, Dry Needling, Electrical stimulation, Spinal mobilization and/or manipulation, Moist heat, Taping, Vasopneumatic device, Ionotophoresis 4mg /ml Dexamethasone, and Manual therapy   PLAN FOR NEXT SESSION: progressive balance, hip flexor/RF/HS stretching, hip ext strengthening, pt's wife is concerned about his hip flexibility   Kevan Ny Christella App PT 07/11/22 10:43 AM

## 2022-07-16 ENCOUNTER — Encounter: Payer: Self-pay | Admitting: Physical Therapy

## 2022-07-16 ENCOUNTER — Ambulatory Visit: Payer: Medicare Other | Admitting: Physical Therapy

## 2022-07-16 DIAGNOSIS — M6281 Muscle weakness (generalized): Secondary | ICD-10-CM

## 2022-07-16 DIAGNOSIS — R2689 Other abnormalities of gait and mobility: Secondary | ICD-10-CM

## 2022-07-16 DIAGNOSIS — R2681 Unsteadiness on feet: Secondary | ICD-10-CM | POA: Diagnosis not present

## 2022-07-16 NOTE — Therapy (Signed)
PHYSICAL THERAPY DISCHARGE SUMMARY  Visits from Start of Care: 14  Current functional level related to goals / functional outcomes: See assessment/goals   Remaining deficits: See assessment/goals   Education / Equipment: HEP and D/C plans  Patient agrees to discharge. Patient goals were met. Patient is being discharged due to meeting the stated rehab goals.    Patient Name: Joshua [name]. MRN: [id number] DOB:May 08, 1941, 81 y.o., male Today's Date: 07/16/2022  PCP: Dickie La, MD REFERRING PROVIDER: Dickie La, MD  END OF SESSION:   PT End of Session - 07/16/22 1001     Visit Number 14    Number of Visits 17    Date for PT Re-Evaluation 07/17/22    Authorization Type [employer] MCR    PT Start Time 1000    PT Stop Time 4196    PT Time Calculation (min) 40 min    Activity Tolerance Patient tolerated treatment well    Behavior During Therapy WFL for tasks assessed/performed               Past Medical History:  Diagnosis Date   Coronary artery disease involving native coronary artery of native heart with angina pectoris ([place])    a. 1990 s/p CABG x 4 (RIMA->LAD, LIMA->LCX, VG->Diag, VG->RCA); b. 2011 s/p BMS to VG->RCA.; c. Unstable Angina 05/2017: Patent RIMA-LAD & LIMA-LCx, ostSVG-RCA 65% ISR & mSVG-RCA  99% - DES PCI to both // Myoview 05/2019:  EF 57, no ischemia or scar, Low Risk    GERD (gastroesophageal reflux disease)    History of echocardiogram    a. 01/2017 Echo: EF 55-60%, no rwma, mild MR, midlly dil LA, PASP 43mmHg.   History of hiatal hernia    Hyperlipidemia    Hypertension    Obesity    Past Surgical History:  Procedure Laterality Date   CATARACT EXTRACTION W/ INTRAOCULAR LENS  IMPLANT, BILATERAL Bilateral    CORONARY ANGIOPLASTY WITH STENT PLACEMENT  2011   "RCA"   CORONARY ARTERY BYPASS GRAFT  1990   CABG X4   CORONARY STENT INTERVENTION N/A 06/24/2017   Procedure: Coronary Stent Intervention;  Surgeon: Belva Crome, MD;  Location: [facility];  Service: Cardiovascular: Ostial 4.0 x 12 mm Onyx DES reducing 70% stenosis to less than 40%. Distal body 3.5 x 15 Onyx DES reducing 99% stenosis to less than 30%.   LEFT HEART CATH AND CORS/GRAFTS ANGIOGRAPHY N/A 06/24/2017   Procedure: Left Heart Cath and Cors/Grafts Angiography;  Surgeon: Belva Crome, MD;  Location: [facility];  Service: Cardiovascular:  CTO native RCA, LAD & LCx (Graft Dependent). Patent LIMA-LCx & RIMA-LAD.  SVG-RCA: ost 65% ISR & mid 99% --> DES PCI to both   LEFT HEART CATH AND CORS/GRAFTS ANGIOGRAPHY N/A 01/04/2021   Procedure: LEFT HEART CATH AND CORS/GRAFTS ANGIOGRAPHY;  Surgeon: Belva Crome, MD;  Location: [facility];  Service: Cardiovascular;  Laterality: N/A;   Patient Active Problem List   Diagnosis Date Noted   Balance problems 01/31/2022   Right shoulder pain 01/31/2022   Dysphagia 01/31/2022   Healthcare maintenance 08/22/2021   History of non-ST elevation myocardial infarction (NSTEMI) 06/25/2017   Presence of drug coated stent in SVG-RCA 06/25/2017   Postherpetic neuralgia 01/02/2017   Tremor 05/15/2016   Irritability and anger 05/15/2016   Seborrheic keratoses 06/16/2014   Special screening for malignant neoplasm of prostate 06/14/2014   Retinal hemorrhage 07/23/2010   Impotence of organic origin 01/16/2010  Essential hypertension 04/18/2009   Hyperlipidemia with target low density lipoprotein (LDL) cholesterol less than 70 mg/dL 01/28/2007   CAD (coronary artery disease) 01/28/2007   HERNIA, HIATAL, NONCONGENITAL 01/28/2007    REFERRING DIAG: Balance problem  THERAPY DIAG:  Balance problems  Muscle weakness  Unsteadiness on feet  Other abnormalities of gait and mobility  Rationale for Evaluation and Treatment Rehabilitation  PERTINENT HISTORY: Heart attack with 4 bypass grafts ~1990, 2018, 2022    PRECAUTIONS: fall   SUBJECTIVE:  Pt reports that he sees continued improvement in has balance.  D/t a  scheduling confusion he will be D/C'd next week and feels he will be ready for this.  PAIN:  Are you having pain? No   OBJECTIVE: (objective measures completed at initial evaluation unless otherwise dated)    DIAGNOSTIC FINDINGS:  NA             GENERAL OBSERVATION/GAIT:                     Slow gait, wide base of support, small step length             Vitals:           127/60, 02 sat 97, HR 53 (pt states this is typical)   SENSATION:          Light touch: Appears intact   MUSCLE LENGTH: Hamstrings: Right moderate restriction; Left moderate restriction ASLR: Right ASLR = PSLR; Left ASLR = PSLR Thomas test: Right significant restriction; Left significant restriction Ely's test: Right significant restriction; Left significant restriction   LE MMT:   MMT Right 05/22/2022 Left 05/22/2022  Hip flexion (L2, L3) 4 4  Knee extension (L3) 4+ 4+  Knee flexion 4+ 4+  Hip abduction 4 4  Hip extension 3+ 3+  Hip external rotation      Hip internal rotation      Hip adduction      Ankle dorsiflexion (L4) 4 4  Ankle plantarflexion (S1)      Ankle inversion      Ankle eversion      Great Toe ext (L5)      Grossly        (Blank rows = not tested, score listed is out of 5 possible points.  N = WNL, D = diminished, C = clear for gross weakness with myotome testing, * = concordant pain with testing)   Functional Tests   Eval (05/22/2022)      BERG BALANCE TEST Sitting to Standing: 4.      Stands without using hands and stabilize independently Standing Unsupported: 4.      Stands safely for 2 minutes Sitting Unsupported: 4.     Sits for 2 minutes independently Standing to Sitting: 2.     Uses back of legs against chair to control descent  Transfers: 4.     Transfers safely with minor use of hands Standing with eyes closed: 3.     Stands 10 seconds with supervision Standing with feet together: 4.     Stands for 1 minute safely Reaching forward with outstretched arm: 3.     Reaches  forward 5 inches Retrieving object from the floor: 3.     Able to pick up with supervision Turning to look behind: 2.     Turns sideways only, maintains balance Turning 360 degrees: 2.     Able to turn slowly, but safely Place alternate foot on stool: 4.  Completes 8 steps in 20 seconds     Standing with one foot in front: 2.     Independent small step for 30 seconds Standing on one foot: 2.     Holds >/=3 seconds   Total Score: 43/56 BERG BALANCE TEST Sitting to Standing: 4.      Stands without using hands and stabilize independently Standing Unsupported: 4.      Stands safely for 2 minutes Sitting Unsupported: 4.     Sits for 2 minutes independently Standing to Sitting: 4.     Sits safely with minimal use of hands Transfers: 4.     Transfers safely with minor use of hands Standing with eyes closed: 4.     Stands safely for 10 seconds  Standing with feet together: 4.     Stands for 1 minute safely Reaching forward with outstretched arm: 4.     Reaches forward 10 inches Retrieving object from the floor: 4.      Able to pick up easily and safely Turning to look behind: 4.     Looks behind from both sides and weight shifts well Turning 360 degrees: 4.     Able to turn in </=4 seconds  Place alternate foot on stool: 4.     Completes 8 steps in 20 seconds     Standing with one foot in front: 4.     Independent tandem for 30 seconds  Standing on one foot: 1.     Holds <3 seconds  Total Score: 53/56      Tug: 13''                                                                                            PATIENT SURVEYS:  ABC scale 75.6%  HOME EXERCISE PROGRAM:   Access Code: [email or web address] URL: [email or web address] Date: 05/29/2022 Prepared by: [place]  - 1 x daily - 7 x weekly - 2 sets - 1 min hold - Seated Hamstring Stretch  - 1 x daily - 7 x weekly - 2 sets - 5min hold - Supine Bridge  - 1 x daily - 7 x weekly - 2  sets - 10 reps - Hooklying Clamshell with Resistance  - 1 x daily - 7 x weekly - 2 sets - 10 reps - Sit to Stand  - 1 x daily - 7 x weekly - 2 sets - 10 reps - Standing Hip Extension with Counter Support  - 1 x daily - 7 x weekly - 2 sets - 10 reps - Semi-Tandem Balance at [employer] Eyes Open  - 1 x daily - 7 x weekly - 3 sets - 30 sec  hold ASTERISK SIGNS     Asterisk Signs Eval (05/22/2022) 7/19   7/21        Hip flexor tightness Signficant bil   Subtle bil         RF tightness Significant bil   significantbil         Hip extensor strength L/R 3+/3+            Balance  Tandem: unable Tandem on foam for 54''           Tug 13''                  TODAY'S TREATMENT:   [facility] Adult PT Treatment:                                                DATE: 07/11/2022 Therapeutic Exercise: NuStep level 10 x 5 minutes no UE Prone rec fem strech - CRC Supine S/L fig 4 bridge with emphasis on hip ext - 2x10 Step down 10x ea 6'' step   NRE: Freight forwarder  - 2' - DF/PF Airex balance beam walking (non-tandem) Heel toe walking fwd and backward at counter x 3 laps ea  Therapeutic Activity - collecting information for goals, checking progress, and reviewing with patient  [facility] Adult PT Treatment:                                                DATE: 07/09/2022 Therapeutic Exercise: NuStep level 9 x 5 minutes no UE Prone rec fem strech - CRC Supine S/L fig 4 bridge with emphasis on hip ext - 2x10 Step ups and down 10x ea 6'' step -lat   Power focus STS with orange ball throw over head Power focus flexion with orange ball slam NRE: Freight forwarder  - 2' - DF/PF Airex balance beam walking (non-tandem) Heel toe walking fwd and backwards at counter x 3 laps ea  [facility] Adult PT Treatment:                                                DATE: 07/02/2022 Therapeutic Exercise: NuStep level 9 x 5 minutes no UE Prone rec fem strech - CRC Supine S/L fig 4 bridge with emphasis on hip ext - 2x10 Step  ups and down 10x ea 6'' step Power focus STS with blue ball throw over head Power focus flexion with blue ball slam NRE: Freight forwarder  - 2' - DF/PF Airex balance beam walking (non-tandem) Heel toe walking fwd and backwards at counter x 3 laps ea  [facility] Adult PT Treatment:                                                DATE: 07/02/2022 Therapeutic Exercise: NuStep level 8 x 5 minutes no UE Supine modified thomas +RF EOM - 2 min ea (NT) Supine staggered bridge with emphasis on hip ext - 2x10 (NT) Sit to stand D/L with full hip ext - 3x10 - 10# - for power (NT) Step ups x 10 each fwd/lateral 8 inch  Power focus STS with blue ball throw over head Power focus flexion with blue ball slam NRE: Freight forwarder  - 2' - DF/PF Airex balance beam walking (non-tandem) Heel toe walking at counter x 2 laps  Therapeutic Activity - collecting information for goals, checking progress, and reviewing with patient  [facility] Adult PT Treatment:                                                DATE: 06/27/2022 Therapeutic Exercise: NuStep level 8 x 5 minutes no UE Supine modified thomas +RF EOM - 2 min ea Supine staggered bridge with emphasis on hip ext - 2x10 Sit to stand D/L with full hip ext - 3x10 - 10# - for power Step ups x 10 each lateral 6 inch  NRE: Tandem on foam - 45'' bouts Wooden rocker board  - 2' - DF/PF Airex balance beam walking (non-tandem) Heel toe walking at counter x 2 laps  [facility] Adult PT Treatment:                                                DATE: 06/24/2022 Therapeutic Exercise: NuStep level 8 x 5 minutes  Supine modified thomas +RF EOM - 2 min ea Supine bridge with emphasis on hip ext - 2x10 Sit to stand with overhead press blue med ball 2 x 10  Step ups x 10 each lateral 6 inch  NRE: Tandem on foam - 45'' bouts Wooden rocker board  - 2' - DF/PF Airex balance beam walking (non-tandem) Romberg stance on airex EC x30" Heel toe walking at counter x 2 laps  [facility]  Adult PT Treatment:                                                DATE: 06/20/22 Therapeutic Exercise: NuStep level 8 x 5 minutes  Supine modified thomas +RF EOM - 2 min ea Supine bridge with emphasis on hip ext - 2x10 Sit to stand with overhead press blue med ball 2 x 10  Step ups x 10 each lateral 6 inch  NRE: Tandem on foam - 45'' bouts Wooden rocker board  - 2' - DF/PF Airex balance beam walking (non-tandem) Heel toe walking  [facility] Adult PT Treatment:                                                DATE: 06/18/22 Therapeutic Exercise: NuStep level 8 x 5 minutes  Prone quad stretch with strap x 60 second each (NT) Figure 4 stretch x 60 seconds each (NT) Sit to stand with overhead press yellow med ball 2 x 10  Hurdle step overs in // bars forward step to and reciprocal stepping 3 trials down and back each Hurdle step overs in // bars lateral 3 trials down and back Step ups 2 x 10 each forward and lateral 4 inch   NRE: Semi tandem on foam - 45'' bouts Tandem on foam - 45'' bouts Wooden rocker board  - 2' - DF/PF  [facility] Adult PT Treatment:  DATE: 06/13/22 Therapeutic Exercise: NuStep level 6 x 5 minutes  Prone quad stretch with strap x 60 second each  Figure 4 stretch x 60 seconds each  Sit to stand with overhead press yellow med ball 2 x 10   Therapeutic Activity: Hurdle step overs in // bars forward step to and reciprocal stepping 3 trials down and back each Hurdle step overs in // bars lateral 3 trials down and back Step ups in // bars 1 x 10 each forward and lateral 4 inch     ASSESSMENT:   CLINICAL IMPRESSION: Kaydin T Michele Mcalpine. has progressed very well with therapy.  Improved impairments include: balance, LE strength.  Functional improvements include: transfers, ability to pick items from floor, ability to complete yard and housework with reduced fear of falling.  Progressions needed include: continued work at home with HEP  progressing to SLS as able safely.  Barriers to progress include: none.  Please see GOALS section for progress on short term and long term goals established at evaluation.  I recommend D/C home with HEP; pt agrees with plan.   OBJECTIVE IMPAIRMENTS: Pain, hip flexor tightness, HS tightness, hip ext strength, balance, gait   ACTIVITY LIMITATIONS: feeling confident navigating stairs or in community   PERSONAL FACTORS: See medical history and pertinent history     REHAB POTENTIAL: Good   CLINICAL DECISION MAKING: Stable/uncomplicated   EVALUATION COMPLEXITY: Low     GOALS:     SHORT TERM GOALS: Target date: 06/12/2022   Arles will be >75% HEP compliant to improve carryover between sessions and facilitate independent management of condition   Evaluation (05/22/2022): ongoing Goal status: MET     LONG TERM GOALS: Target date: 07/17/2022   Trisha will improve the following MMTs to >/= 4/5 to show improvement in strength:  hip ext strength    Evaluation/Baseline (05/22/2022): see chart in note 8/2: 4/5 Goal status: MET     2.  [place] will show a >/= 15% improvement in their ABC score as a proxy for functional improvement    Evaluation/Baseline (05/22/2022): 75% confident 8/2: 85% 8/16: 92% Goal status: MET       3.  [place] will imporve BERG balance score to 50 pts, to show a significant improvement in balance in order to reduce fall risk   Evaluation/Baseline (05/22/2022): 43 pts 8/16: 53 Goal status: MET   See interpretation below:       4.  [place] will be able to stand for >15'' in tandem stance, to show a significant improvement in balance in order to reduce fall risk    Evaluation/Baseline (05/22/2022): unable 7/19: 45'' tandem on foam Goal status: MET   5. Badr will express confidence in navigating 10 steps using reciprocal pattern   Evaluation/Baseline (05/22/2022): 70% confident 8/2: 12 Goal status: MET         PLAN: PT FREQUENCY: 1-2x/week   PT  DURATION: 8 weeks (Ending 07/17/2022)   PLANNED INTERVENTIONS: Therapeutic exercises, Aquatic therapy, Therapeutic activity, Neuro Muscular re-education, Gait training, Patient/Family education, Joint mobilization, Dry Needling, Electrical stimulation, Spinal mobilization and/or manipulation, Moist heat, Taping, Vasopneumatic device, Ionotophoresis 4mg /ml Dexamethasone, and Manual therapy   PLAN FOR NEXT SESSION: progressive balance, hip flexor/RF/HS stretching, hip ext strengthening, pt's wife is concerned about his hip flexibility   Kevan Ny Willisha Sligar PT 07/16/22 10:46 AM

## 2022-07-31 DIAGNOSIS — G4733 Obstructive sleep apnea (adult) (pediatric): Secondary | ICD-10-CM | POA: Diagnosis not present

## 2022-08-21 DIAGNOSIS — G4733 Obstructive sleep apnea (adult) (pediatric): Secondary | ICD-10-CM | POA: Diagnosis not present

## 2022-08-26 ENCOUNTER — Other Ambulatory Visit: Payer: Self-pay | Admitting: Interventional Cardiology

## 2022-08-30 DIAGNOSIS — G4733 Obstructive sleep apnea (adult) (pediatric): Secondary | ICD-10-CM | POA: Diagnosis not present

## 2022-09-10 ENCOUNTER — Other Ambulatory Visit: Payer: Self-pay | Admitting: Family Medicine

## 2022-09-17 ENCOUNTER — Ambulatory Visit (INDEPENDENT_AMBULATORY_CARE_PROVIDER_SITE_OTHER): Payer: Medicare Other

## 2022-09-17 DIAGNOSIS — Z23 Encounter for immunization: Secondary | ICD-10-CM | POA: Diagnosis not present

## 2022-09-18 ENCOUNTER — Encounter: Payer: Self-pay | Admitting: Interventional Cardiology

## 2022-09-18 ENCOUNTER — Telehealth: Payer: Self-pay | Admitting: Interventional Cardiology

## 2022-09-18 NOTE — Telephone Encounter (Signed)
Pt c/o of Chest Pain: STAT if CP now or developed within 24 hours  1. Are you having CP right now?  Not currently because patient is sitting  2. Are you experiencing any other symptoms (ex. SOB, nausea, vomiting, sweating)?  No   3. How long have you been experiencing CP?  Past 3 weeks   4. Is your CP continuous or coming and going?  Coming and going, with exertion  5. Have you taken Nitroglycerin?  Patient's wife states the patient has nitro, but has not taken any. Patient's wife states the patient refers to it as a chest ache and not pain. ?

## 2022-09-18 NOTE — Telephone Encounter (Signed)
Spoke with [name] patient's wife. The patient is having an ache in his chest when he walks up their 4 outside steps into their house.  Relieves w rest. Has not tried NTG.  Has current prescription. Taking IMDUR 120 daily   BP has been good 130s-140s.  Describes the ache as what he had previously when had CABG 1990.  Stents in 2018.  He has had SOB noted on last OV w Dr. Tamala Julian.  Reports SOB is a little worse when the chest discomfort is present.   No discomfort at rest.  I added him to the DOD schedule for Monday. Gave ER precautions and to use EMS if needed.  Okay to call on call after hours if needed.

## 2022-09-21 ENCOUNTER — Encounter: Payer: Self-pay | Admitting: Cardiovascular Disease

## 2022-09-21 NOTE — H&P (View-Only) (Signed)
Cardiology Office Note:    Date:  09/22/2022   ID:  [name]., DOB 1941/07/04, MRN [id number]  PCP:  Dickie La, MD   [place] Providers Cardiologist:  Sinclair Grooms, MD {   Referring MD: Dickie La, MD   Chief Complaint  Patient presents with   Coronary Artery Disease   Hyperlipidemia    History of Present Illness:    Joshua [name]. is a 81 y.o. male with a hx of CAD, CABG, HLD, HTN,   He recently started having cp. Is on Imdur 120 mg a day   Is seen with wife, Joshua [name] ( former [employer] employer  - ECG tech)   Symptoms are similar to his angina in 1990 prior to CABG . Chest tightness is with any exertion  Doing chores around the house, mowing , Associated with dyspnea  And leg fatigue  His last heart catheterization was January 04, 2021.  It reveals total occlusion of the left main and native right coronary artery. The saphenous vein graft to the diagonal vessel was occluded The right internal mammary artery to the mid LAD was reportedly patent.  LIMA to LCx is patent ,     SVG to RCA was difficult to engage.  The RCA was not well seen SVG to Diag - ocluded prox        Past Medical History:  Diagnosis Date   Coronary artery disease involving native coronary artery of native heart with angina pectoris ([place])    a. 1990 s/p CABG x 4 (RIMA->LAD, LIMA->LCX, VG->Diag, VG->RCA); b. 2011 s/p BMS to VG->RCA.; c. Unstable Angina 05/2017: Patent RIMA-LAD & LIMA-LCx, ostSVG-RCA 65% ISR & mSVG-RCA  99% - DES PCI to both // Myoview 05/2019:  EF 57, no ischemia or scar, Low Risk    GERD (gastroesophageal reflux disease)    History of echocardiogram    a. 01/2017 Echo: EF 55-60%, no rwma, mild MR, midlly dil LA, PASP 38mHg.   History of hiatal hernia    Hyperlipidemia    Hypertension    Obesity     Past Surgical History:  Procedure Laterality Date   CATARACT EXTRACTION W/ INTRAOCULAR LENS  IMPLANT, BILATERAL Bilateral    CORONARY ANGIOPLASTY  WITH STENT PLACEMENT  2011   "RCA"   CORONARY ARTERY BYPASS GRAFT  1990   CABG X4   CORONARY STENT INTERVENTION N/A 06/24/2017   Procedure: Coronary Stent Intervention;  Surgeon: SBelva Crome MD;  [facility];  Service: Cardiovascular: Ostial 4.0 x 12 mm Onyx DES reducing 70% stenosis to less than 40%. Distal body 3.5 x 15 Onyx DES reducing 99% stenosis to less than 30%.   LEFT HEART CATH AND CORS/GRAFTS ANGIOGRAPHY N/A 06/24/2017   Procedure: Left Heart Cath and Cors/Grafts Angiography;  Surgeon: SBelva Crome MD;  [facility];  Service: Cardiovascular:  CTO native RCA, LAD & LCx (Graft Dependent). Patent LIMA-LCx & RIMA-LAD.  SVG-RCA: ost 65% ISR & mid 99% --> DES PCI to both   LEFT HEART CATH AND CORS/GRAFTS ANGIOGRAPHY N/A 01/04/2021   Procedure: LEFT HEART CATH AND CORS/GRAFTS ANGIOGRAPHY;  Surgeon: SBelva Crome MD;  [facility];  Service: Cardiovascular;  Laterality: N/A;    Current Medications: Current Meds  Medication Sig   ALPRAZolam (XANAX) 0.5 MG tablet TAKE 1 TABLET BY MOUTH AT  BEDTIME AS NEEDED FOR RESTLESS  LEGS   aspirin EC 81 MG EC  tablet Take 1 tablet (81 mg total) by mouth daily.   atenolol-chlorthalidone (TENORETIC) 100-25 MG tablet TAKE 1 TABLET BY MOUTH  DAILY   citalopram (CELEXA) 20 MG tablet TAKE 1 TABLET BY MOUTH  DAILY   dapagliflozin propanediol (FARXIGA) 10 MG TABS tablet Take 1 tablet (10 mg total) by mouth daily before breakfast.   esomeprazole (NEXIUM) 20 MG capsule Take 1 capsule (20 mg total) by mouth daily.   ezetimibe (ZETIA) 10 MG tablet TAKE 1 TABLET BY MOUTH  DAILY   gabapentin (NEURONTIN) 300 MG capsule Take 400 mg by mouth 3 (three) times daily. 2 tab in the morning, 1 at noon, and 2 at bedtime   isosorbide mononitrate (IMDUR) 120 MG 24 hr tablet TAKE 1 TABLET BY MOUTH DAILY   nitroGLYCERIN (NITROSTAT) 0.4 MG SL tablet Place 1 tablet (0.4 mg total) under the tongue every 5 (five) minutes as needed  for chest pain.   simvastatin (ZOCOR) 40 MG tablet TAKE 1 TABLET BY MOUTH DAILY     Allergies:   Ace inhibitors, Atorvastatin, Rosuvastatin, Plavix [clopidogrel bisulfate], Sulfa antibiotics, and Sulfonamide derivatives   Social History   Socioeconomic History   Marital status: Married    Spouse name: Not on file   Number of children: Not on file   Years of education: Not on file   Highest education level: Not on file  Occupational History   Occupation: retired    Comment: manual labor (truck driver, heating and air)  Tobacco Use   Smoking status: Never   Smokeless tobacco: Former    Types: Chew    Quit date: 2000  Vaping Use   Vaping Use: Never used  Substance and Sexual Activity   Alcohol use: Yes    Alcohol/week: 0.0 standard drinks of alcohol    Comment: 06/23/2017 "a few drinks/ month   Drug use: No   Sexual activity: Not Currently  Other Topics Concern   Not on file  Social History Narrative   Lives locally with his wife Joshua [name] - former [employer] ECG tech).  Does not routinely exercise.   Social Determinants of Health   Financial Resource Strain: Not on file  Food Insecurity: Not on file  Transportation Needs: Not on file  Physical Activity: Not on file  Stress: Not on file  Social Connections: Not on file     Family History: The patient's family history includes Cancer in his mother; Coronary artery disease in his father; Heart attack in his father; Hyperlipidemia in his son; Hypertension in his son.  ROS:   Please see the history of present illness.     All other systems reviewed and are negative.  EKGs/Labs/Other Studies Reviewed:    The following studies were reviewed today:   EKG: September 22, 2022: Sinus bradycardia 54.  Right bundle branch block.  No ST or T wave changes. No changes from his previous EKG.  Recent Labs: 07/11/2022: BUN 16; Creatinine, Ser 1.13; Potassium 4.1; Sodium 139  Recent Lipid Panel    Component Value Date/Time   CHOL 151  01/05/2021 0214   CHOL 136 10/06/2017 0740   TRIG 294 (H) 01/05/2021 0214   HDL 25 (L) 01/05/2021 0214   HDL 27 (L) 10/06/2017 0740   CHOLHDL 6.0 01/05/2021 0214   VLDL 59 (H) 01/05/2021 0214   LDLCALC 67 01/05/2021 0214   LDLCALC 79 10/06/2017 0740   LDLDIRECT 87 05/30/2020 1231   LDLDIRECT 84 05/14/2016 1234     Risk Assessment/Calculations:  STOP-Bang Score:  6       Physical Exam:    VS:  BP 128/60   Pulse (!) 54   Ht '5\' 6"'$  (1.676 m)   Wt 233 lb 6.4 oz (105.9 kg)   SpO2 92%   BMI 37.67 kg/m     Wt Readings from Last 3 Encounters:  09/22/22 233 lb 6.4 oz (105.9 kg)  06/04/22 232 lb (105.2 kg)  01/31/22 220 lb (99.8 kg)     GEN:  elderly  male, in no acute distress HEENT: Normal NECK: No JVD; No carotid bruits LYMPHATICS: No lymphadenopathy CARDIAC: RRR, no murmurs, rubs, gallops RESPIRATORY:  Clear to auscultation without rales, wheezing or rhonchi  ABDOMEN: Soft, non-tender, non-distended MUSCULOSKELETAL:  No edema; No deformity  SKIN: Warm and dry NEUROLOGIC:  Alert and oriented x 3 PSYCHIATRIC:  Normal affect   ASSESSMENT:    1. Unstable angina ([place])   2. Hyperlipidemia, unspecified hyperlipidemia type    PLAN:    In order of problems listed above:  Unstable angina: Mr. Willmore presents for further evaluation and management of symptoms consistent with unstable angina.  He has had symptoms for about 6 weeks.  He has last heart catheterization year and a half ago.  It revealed patent RIMA to the LAD and a patent LIMA to the left circumflex artery.  The saphenous vein graft to his right coronary artery was very difficult to engage and had a at least 50% ostial stenosis  Given his symptoms I think we should proceed with heart catheterization given.  We discussed the risk, benefits, options of heart catheterization.  He understands and agrees to proceed.       Shared Decision Making/Informed Consent The risks [stroke (1 in 1000), death (1 in  1000), kidney failure [usually temporary] (1 in 500), bleeding (1 in 200), allergic reaction [possibly serious] (1 in 200)], benefits (diagnostic support and management of coronary artery disease) and alternatives of a cardiac catheterization were discussed in detail with Mr. Steedman and he is willing to proceed.    Medication Adjustments/Labs and Tests Ordered: Current medicines are reviewed at length with the patient today.  Concerns regarding medicines are outlined above.  Orders Placed This Encounter  Procedures   CBC   Basic metabolic panel   Lipid panel   ALT   EKG 12-Lead   No orders of the defined types were placed in this encounter.    Patient Instructions  Medication Instructions:  Your physician recommends that you continue on your current medications as directed. Please refer to the Current Medication list given to you today.  *If you need a refill on your cardiac medications before your next appointment, please call your pharmacy*   Lab Work: CBC, BMET, Lipids, ALT Today If you have labs (blood work) drawn today and your tests are completely normal, you will receive your results only by: [place] (if you have MyChart) OR A paper copy in the mail If you have any lab test that is abnormal or we need to change your treatment, we will call you to review the results.   Testing/Procedures: R & L heart catheterization Your physician has requested that you have a cardiac catheterization. Cardiac catheterization is used to diagnose and/or treat various heart conditions. Doctors may recommend this procedure for a number of different reasons. The most common reason is to evaluate chest pain. Chest pain can be a symptom of coronary artery disease (CAD), and cardiac catheterization can show whether plaque is narrowing  or blocking your heart's arteries. This procedure is also used to evaluate the valves, as well as measure the blood flow and oxygen levels in different parts of  your heart. For further information please visit HugeFiesta.tn. Please follow instruction sheet, as given.  Follow-Up: At [facility], you and your health needs are our priority.  As part of our continuing mission to provide you with exceptional heart care, we have created designated Provider Care Teams.  These Care Teams include your primary Cardiologist (physician) and Advanced Practice Providers (APPs -  Physician Assistants and Nurse Practitioners) who all work together to provide you with the care you need, when you need it.  Your next appointment:   3 week(s)  The format for your next appointment:   In Person  Provider:   Daneen Schick or Richardson Dopp, PA     Other Instructions       Cardiac/Peripheral Catheterization   You are scheduled for a Cardiac Catheterization on Thursday, October 26 with Dr. [name].  1. Please arrive at the Main Entrance A at [facility]: [place], [place] [address] on October 26 at 8:30 AM (This time is two hours before your procedure to ensure your preparation). Free valet parking service is available. You will check in at ADMITTING. The support person will be asked to wait in the waiting room.  It is OK to have someone drop you off and come back when you are ready to be discharged.        Special note: Every effort is made to have your procedure done on time. Please understand that emergencies sometimes delay scheduled procedures.   . 2. Diet: Do not eat solid foods after midnight.  You may have clear liquids until 5 AM the day of the procedure.  3. Labs: TODAY  4. Medication instructions in preparation for your procedure:   Contrast Allergy: No  DO NOT TAKE Farxiga the morning/day of procedure  On the morning of your procedure, take Aspirin 81 mg and any morning medicines NOT listed above.  You may use sips of water.  5. Plan to go home the same day, you will only stay overnight if medically  necessary. 6. You MUST have a responsible adult to drive you home. 7. An adult MUST be with you the first 24 hours after you arrive home. 8. Bring a current list of your medications, and the last time and date medication taken. 9. Bring ID and current insurance cards. 10.Please wear clothes that are easy to get on and off and wear slip-on shoes.  Thank you for allowing [place] to care for you!   -- [place] [facility]   Important Information About Sugar         Signed, Mertie Moores, MD  09/22/2022 4:56 PM    [place]

## 2022-09-21 NOTE — Progress Notes (Unsigned)
Cardiology Office Note:    Date:  09/22/2022   ID:  Joshua [name]., DOB [birth year], MRN [id number]  PCP:  Dickie La, MD   [place] Providers Cardiologist:  Sinclair Grooms, MD {   Referring MD: Dickie La, MD   Chief Complaint  Patient presents with   Coronary Artery Disease   Hyperlipidemia    History of Present Illness:    Joshua [name]. is a 81 y.o. male with a hx of CAD, CABG, HLD, HTN,   He recently started having cp. Is on Imdur 120 mg a day   Is seen with wife, [name] ( former [employer] employer  - ECG tech)   Symptoms are similar to his angina in 1990 prior to CABG . Chest tightness is with any exertion  Doing chores around the house, mowing , Associated with dyspnea  And leg fatigue  His last heart catheterization was January 04, 2021.  It reveals total occlusion of the left main and native right coronary artery. The saphenous vein graft to the diagonal vessel was occluded The right internal mammary artery to the mid LAD was reportedly patent.  LIMA to LCx is patent ,     SVG to RCA was difficult to engage.  The RCA was not well seen SVG to Diag - ocluded prox        Past Medical History:  Diagnosis Date   Coronary artery disease involving native coronary artery of native heart with angina pectoris ([place])    a. 1990 s/p CABG x 4 (RIMA->LAD, LIMA->LCX, VG->Diag, VG->RCA); b. 2011 s/p BMS to VG->RCA.; c. Unstable Angina 05/2017: Patent RIMA-LAD & LIMA-LCx, ostSVG-RCA 65% ISR & mSVG-RCA  99% - DES PCI to both // Myoview 05/2019:  EF 57, no ischemia or scar, Low Risk    GERD (gastroesophageal reflux disease)    History of echocardiogram    a. 01/2017 Echo: EF 55-60%, no rwma, mild MR, midlly dil LA, PASP 14mHg.   History of hiatal hernia    Hyperlipidemia    Hypertension    Obesity     Past Surgical History:  Procedure Laterality Date   CATARACT EXTRACTION W/ INTRAOCULAR LENS  IMPLANT, BILATERAL Bilateral    CORONARY ANGIOPLASTY  WITH STENT PLACEMENT  2011   "RCA"   CORONARY ARTERY BYPASS GRAFT  1990   CABG X4   CORONARY STENT INTERVENTION N/A 06/24/2017   Procedure: Coronary Stent Intervention;  Surgeon: SBelva Crome MD;  [facility];  Service: Cardiovascular: Ostial 4.0 x 12 mm Onyx DES reducing 70% stenosis to less than 40%. Distal body 3.5 x 15 Onyx DES reducing 99% stenosis to less than 30%.   LEFT HEART CATH AND CORS/GRAFTS ANGIOGRAPHY N/A 06/24/2017   Procedure: Left Heart Cath and Cors/Grafts Angiography;  Surgeon: SBelva Crome MD;  [facility];  Service: Cardiovascular:  CTO native RCA, LAD & LCx (Graft Dependent). Patent LIMA-LCx & RIMA-LAD.  SVG-RCA: ost 65% ISR & mid 99% --> DES PCI to both   LEFT HEART CATH AND CORS/GRAFTS ANGIOGRAPHY N/A 01/04/2021   Procedure: LEFT HEART CATH AND CORS/GRAFTS ANGIOGRAPHY;  Surgeon: SBelva Crome MD;  [facility];  Service: Cardiovascular;  Laterality: N/A;    Current Medications: Current Meds  Medication Sig   ALPRAZolam (XANAX) 0.5 MG tablet TAKE 1 TABLET BY MOUTH AT  BEDTIME AS NEEDED FOR RESTLESS  LEGS   aspirin EC 81 MG EC  tablet Take 1 tablet (81 mg total) by mouth daily.   atenolol-chlorthalidone (TENORETIC) 100-25 MG tablet TAKE 1 TABLET BY MOUTH  DAILY   citalopram (CELEXA) 20 MG tablet TAKE 1 TABLET BY MOUTH  DAILY   dapagliflozin propanediol (FARXIGA) 10 MG TABS tablet Take 1 tablet (10 mg total) by mouth daily before breakfast.   esomeprazole (NEXIUM) 20 MG capsule Take 1 capsule (20 mg total) by mouth daily.   ezetimibe (ZETIA) 10 MG tablet TAKE 1 TABLET BY MOUTH  DAILY   gabapentin (NEURONTIN) 300 MG capsule Take 400 mg by mouth 3 (three) times daily. 2 tab in the morning, 1 at noon, and 2 at bedtime   isosorbide mononitrate (IMDUR) 120 MG 24 hr tablet TAKE 1 TABLET BY MOUTH DAILY   nitroGLYCERIN (NITROSTAT) 0.4 MG SL tablet Place 1 tablet (0.4 mg total) under the tongue every 5 (five) minutes as needed  for chest pain.   simvastatin (ZOCOR) 40 MG tablet TAKE 1 TABLET BY MOUTH DAILY     Allergies:   Ace inhibitors, Atorvastatin, Rosuvastatin, Plavix [clopidogrel bisulfate], Sulfa antibiotics, and Sulfonamide derivatives   Social History   Socioeconomic History   Marital status: Married    Spouse name: Not on file   Number of children: Not on file   Years of education: Not on file   Highest education level: Not on file  Occupational History   Occupation: retired    Comment: manual labor (truck driver, heating and air)  Tobacco Use   Smoking status: Never   Smokeless tobacco: Former    Types: Chew    Quit date: 2000  Vaping Use   Vaping Use: Never used  Substance and Sexual Activity   Alcohol use: Yes    Alcohol/week: 0.0 standard drinks of alcohol    Comment: 06/23/2017 "a few drinks/ month   Drug use: No   Sexual activity: Not Currently  Other Topics Concern   Not on file  Social History Narrative   Lives locally with his wife [name] - former [employer] ECG tech).  Does not routinely exercise.   Social Determinants of Health   Financial Resource Strain: Not on file  Food Insecurity: Not on file  Transportation Needs: Not on file  Physical Activity: Not on file  Stress: Not on file  Social Connections: Not on file     Family History: The patient's family history includes Cancer in his mother; Coronary artery disease in his father; Heart attack in his father; Hyperlipidemia in his son; Hypertension in his son.  ROS:   Please see the history of present illness.     All other systems reviewed and are negative.  EKGs/Labs/Other Studies Reviewed:    The following studies were reviewed today:   EKG: September 22, 2022: Sinus bradycardia 54.  Right bundle branch block.  No ST or T wave changes. No changes from his previous EKG.  Recent Labs: 07/11/2022: BUN 16; Creatinine, Ser 1.13; Potassium 4.1; Sodium 139  Recent Lipid Panel    Component Value Date/Time   CHOL 151  01/05/2021 0214   CHOL 136 10/06/2017 0740   TRIG 294 (H) 01/05/2021 0214   HDL 25 (L) 01/05/2021 0214   HDL 27 (L) 10/06/2017 0740   CHOLHDL 6.0 01/05/2021 0214   VLDL 59 (H) 01/05/2021 0214   LDLCALC 67 01/05/2021 0214   LDLCALC 79 10/06/2017 0740   LDLDIRECT 87 05/30/2020 1231   LDLDIRECT 84 05/14/2016 1234     Risk Assessment/Calculations:  STOP-Bang Score:  6       Physical Exam:    VS:  BP 128/60   Pulse (!) 54   Ht '5\' 6"'$  (1.676 m)   Wt 233 lb 6.4 oz (105.9 kg)   SpO2 92%   BMI 37.67 kg/m     Wt Readings from Last 3 Encounters:  09/22/22 233 lb 6.4 oz (105.9 kg)  06/04/22 232 lb (105.2 kg)  01/31/22 220 lb (99.8 kg)     GEN:  elderly  male, in no acute distress HEENT: Normal NECK: No JVD; No carotid bruits LYMPHATICS: No lymphadenopathy CARDIAC: RRR, no murmurs, rubs, gallops RESPIRATORY:  Clear to auscultation without rales, wheezing or rhonchi  ABDOMEN: Soft, non-tender, non-distended MUSCULOSKELETAL:  No edema; No deformity  SKIN: Warm and dry NEUROLOGIC:  Alert and oriented x 3 PSYCHIATRIC:  Normal affect   ASSESSMENT:    1. Unstable angina ([place])   2. Hyperlipidemia, unspecified hyperlipidemia type    PLAN:    In order of problems listed above:  Unstable angina: Mr. Dever presents for further evaluation and management of symptoms consistent with unstable angina.  He has had symptoms for about 6 weeks.  He has last heart catheterization year and a half ago.  It revealed patent RIMA to the LAD and a patent LIMA to the left circumflex artery.  The saphenous vein graft to his right coronary artery was very difficult to engage and had a at least 50% ostial stenosis  Given his symptoms I think we should proceed with heart catheterization given.  We discussed the risk, benefits, options of heart catheterization.  He understands and agrees to proceed.       Shared Decision Making/Informed Consent The risks [stroke (1 in 1000), death (1 in  1000), kidney failure [usually temporary] (1 in 500), bleeding (1 in 200), allergic reaction [possibly serious] (1 in 200)], benefits (diagnostic support and management of coronary artery disease) and alternatives of a cardiac catheterization were discussed in detail with Mr. Wachsmuth and he is willing to proceed.    Medication Adjustments/Labs and Tests Ordered: Current medicines are reviewed at length with the patient today.  Concerns regarding medicines are outlined above.  Orders Placed This Encounter  Procedures   CBC   Basic metabolic panel   Lipid panel   ALT   EKG 12-Lead   No orders of the defined types were placed in this encounter.    Patient Instructions  Medication Instructions:  Your physician recommends that you continue on your current medications as directed. Please refer to the Current Medication list given to you today.  *If you need a refill on your cardiac medications before your next appointment, please call your pharmacy*   Lab Work: CBC, BMET, Lipids, ALT Today If you have labs (blood work) drawn today and your tests are completely normal, you will receive your results only by: [place] (if you have MyChart) OR A paper copy in the mail If you have any lab test that is abnormal or we need to change your treatment, we will call you to review the results.   Testing/Procedures: R & L heart catheterization Your physician has requested that you have a cardiac catheterization. Cardiac catheterization is used to diagnose and/or treat various heart conditions. Doctors may recommend this procedure for a number of different reasons. The most common reason is to evaluate chest pain. Chest pain can be a symptom of coronary artery disease (CAD), and cardiac catheterization can show whether plaque is narrowing  or blocking your heart's arteries. This procedure is also used to evaluate the valves, as well as measure the blood flow and oxygen levels in different parts of  your heart. For further information please visit HugeFiesta.tn. Please follow instruction sheet, as given.  Follow-Up: At [facility], you and your health needs are our priority.  As part of our continuing mission to provide you with exceptional heart care, we have created designated Provider Care Teams.  These Care Teams include your primary Cardiologist (physician) and Advanced Practice Providers (APPs -  Physician Assistants and Nurse Practitioners) who all work together to provide you with the care you need, when you need it.  Your next appointment:   3 week(s)  The format for your next appointment:   In Person  Provider:   Daneen Schick or Richardson Dopp, PA     Other Instructions       Cardiac/Peripheral Catheterization   You are scheduled for a Cardiac Catheterization on Thursday, October 26 with Dr. [name].  1. Please arrive at the Main Entrance A at [facility]: [place], [place] [address] on October 26 at 8:30 AM (This time is two hours before your procedure to ensure your preparation). Free valet parking service is available. You will check in at ADMITTING. The support person will be asked to wait in the waiting room.  It is OK to have someone drop you off and come back when you are ready to be discharged.        Special note: Every effort is made to have your procedure done on time. Please understand that emergencies sometimes delay scheduled procedures.   . 2. Diet: Do not eat solid foods after midnight.  You may have clear liquids until 5 AM the day of the procedure.  3. Labs: TODAY  4. Medication instructions in preparation for your procedure:   Contrast Allergy: No  DO NOT TAKE Farxiga the morning/day of procedure  On the morning of your procedure, take Aspirin 81 mg and any morning medicines NOT listed above.  You may use sips of water.  5. Plan to go home the same day, you will only stay overnight if medically  necessary. 6. You MUST have a responsible adult to drive you home. 7. An adult MUST be with you the first 24 hours after you arrive home. 8. Bring a current list of your medications, and the last time and date medication taken. 9. Bring ID and current insurance cards. 10.Please wear clothes that are easy to get on and off and wear slip-on shoes.  Thank you for allowing [place] to care for you!   -- [place] [facility]   Important Information About Sugar         Signed, Mertie Moores, MD  09/22/2022 4:56 PM    [place]

## 2022-09-22 ENCOUNTER — Encounter: Payer: Self-pay | Admitting: Cardiovascular Disease

## 2022-09-22 ENCOUNTER — Ambulatory Visit: Payer: Medicare Other | Attending: Cardiovascular Disease | Admitting: Cardiovascular Disease

## 2022-09-22 VITALS — BP 128/60 | HR 54 | Ht 66.0 in | Wt 233.4 lb

## 2022-09-22 DIAGNOSIS — I200 Unstable angina: Secondary | ICD-10-CM | POA: Diagnosis not present

## 2022-09-22 DIAGNOSIS — E785 Hyperlipidemia, unspecified: Secondary | ICD-10-CM

## 2022-09-22 NOTE — Patient Instructions (Addendum)
Medication Instructions:  Your physician recommends that you continue on your current medications as directed. Please refer to the Current Medication list given to you today.  *If you need a refill on your cardiac medications before your next appointment, please call your pharmacy*   Lab Work: CBC, BMET, Lipids, ALT Today If you have labs (blood work) drawn today and your tests are completely normal, you will receive your results only by: [place] (if you have MyChart) OR A paper copy in the mail If you have any lab test that is abnormal or we need to change your treatment, we will call you to review the results.   Testing/Procedures: R & L heart catheterization Your physician has requested that you have a cardiac catheterization. Cardiac catheterization is used to diagnose and/or treat various heart conditions. Doctors may recommend this procedure for a number of different reasons. The most common reason is to evaluate chest pain. Chest pain can be a symptom of coronary artery disease (CAD), and cardiac catheterization can show whether plaque is narrowing or blocking your heart's arteries. This procedure is also used to evaluate the valves, as well as measure the blood flow and oxygen levels in different parts of your heart. For further information please visit [email or web address]. Please follow instruction sheet, as given.  Follow-Up: At [facility], you and your health needs are our priority.  As part of our continuing mission to provide you with exceptional heart care, we have created designated Provider Care Teams.  These Care Teams include your primary Cardiologist (physician) and Advanced Practice Providers (APPs -  Physician Assistants and Nurse Practitioners) who all work together to provide you with the care you need, when you need it.  Your next appointment:   3 week(s)  The format for your next appointment:   In Person  Provider:   Daneen Schick or Richardson Dopp,  PA     Other Instructions       Cardiac/Peripheral Catheterization   You are scheduled for a Cardiac Catheterization on Thursday, October 26 with Dr. [name].  1. Please arrive at the Main Entrance A at [facility]: [place], [place] [address] on October 26 at 8:30 AM (This time is two hours before your procedure to ensure your preparation). Free valet parking service is available. You will check in at ADMITTING. The support person will be asked to wait in the waiting room.  It is OK to have someone drop you off and come back when you are ready to be discharged.        Special note: Every effort is made to have your procedure done on time. Please understand that emergencies sometimes delay scheduled procedures.   . 2. Diet: Do not eat solid foods after midnight.  You may have clear liquids until 5 AM the day of the procedure.  3. Labs: TODAY  4. Medication instructions in preparation for your procedure:   Contrast Allergy: No  DO NOT TAKE Farxiga the morning/day of procedure  On the morning of your procedure, take Aspirin 81 mg and any morning medicines NOT listed above.  You may use sips of water.  5. Plan to go home the same day, you will only stay overnight if medically necessary. 6. You MUST have a responsible adult to drive you home. 7. An adult MUST be with you the first 24 hours after you arrive home. 8. Bring a current list of your medications, and the last time and date  medication taken. 9. Bring ID and current insurance cards. 10.Please wear clothes that are easy to get on and off and wear slip-on shoes.  Thank you for allowing [place] to care for you!   -- [facility]   Important Information About Sugar

## 2022-09-23 ENCOUNTER — Telehealth: Payer: Self-pay | Admitting: *Deleted

## 2022-09-23 LAB — LIPID PANEL
Chol/HDL Ratio: 5 ratio (ref 0.0–5.0)
Cholesterol, Total: 141 mg/dL (ref 100–199)
HDL: 28 mg/dL — ABNORMAL LOW (ref >39)
LDL Chol Calc (NIH): 85 mg/dL (ref 0–99)
Triglycerides: 159 mg/dL — ABNORMAL HIGH (ref 0–149)
VLDL Cholesterol Cal: 28 mg/dL (ref 5–40)

## 2022-09-23 LAB — BASIC METABOLIC PANEL
BUN/Creatinine Ratio: 17 (ref 10–24)
BUN: 16 mg/dL (ref 8–27)
CO2: 23 mmol/L (ref 20–29)
Calcium: 9.1 mg/dL (ref 8.6–10.2)
Chloride: 97 mmol/L (ref 96–106)
Creatinine, Ser: 0.94 mg/dL (ref 0.76–1.27)
Glucose: 137 mg/dL — ABNORMAL HIGH (ref 70–99)
Potassium: 4.1 mmol/L (ref 3.5–5.2)
Sodium: 137 mmol/L (ref 134–144)
eGFR: 83 mL/min/{1.73_m2} (ref >59)

## 2022-09-23 LAB — CBC
Hematocrit: 39.9 % (ref 37.5–51.0)
Hemoglobin: 13.1 g/dL (ref 13.0–17.7)
MCH: 27 pg (ref 26.6–33.0)
MCHC: 32.8 g/dL (ref 31.5–35.7)
MCV: 82 fL (ref 79–97)
Platelets: 195 10*3/uL (ref 150–450)
RBC: 4.86 x10E6/uL (ref 4.14–5.80)
RDW: 13.8 % (ref 11.6–15.4)
WBC: 7 10*3/uL (ref 3.4–10.8)

## 2022-09-23 LAB — ALT: ALT: 24 IU/L (ref 0–44)

## 2022-09-23 NOTE — Telephone Encounter (Signed)
Cardiac Catheterization scheduled at [facility] for: Thursday September 25, 2022 10:30 AM Arrival time and place: [place] Entrance A at: 8:30 AM  Nothing to eat after midnight prior to procedure, clear liquids until 5 AM day of procedure.  Medication instructions: -Hold:  Farxiga-AM of procedure  Atenolol-Chlorthalidone-AM of procedure -Except hold medications usual morning medications can be taken with sips of water including aspirin 81 mg.  Confirmed patient has responsible adult to drive home post procedure and be with patient first 24 hours after arriving home.  Patient reports no new symptoms concerning for COVID-19 in the past 10 days.  Reviewed procedure instructions with patient's wife (DPR).

## 2022-09-24 ENCOUNTER — Telehealth: Payer: Self-pay

## 2022-09-24 DIAGNOSIS — Z79899 Other long term (current) drug therapy: Secondary | ICD-10-CM

## 2022-09-24 MED ORDER — ROSUVASTATIN CALCIUM 20 MG PO TABS: 20.0000 mg | ORAL_TABLET | Freq: Every day | ORAL | 3 refills | Status: DC

## 2022-09-24 NOTE — Telephone Encounter (Signed)
-----   Message from Thayer Headings, MD sent at 09/23/2022  9:08 PM EDT ----- Glucose is mildly elevated  Trigs are mildly elevated  LDL is 85.  Goal is 55.  Please DC simvastatin, start rosuvastatin 20 mg a day  Check lipids , ALT in 3 months  Cath has been scheduled for symptoms of unstable angian  CBC is stable for cath

## 2022-09-24 NOTE — Telephone Encounter (Signed)
Called and spoke with wife Bailey Mech who states that while crestor did give him muscle aches in the past, he is willing to try again. States he will cut the dose in half if needed, but will re-attempt to see if he can tolerate. Sening Crestor into [employer]. Labs entered and scheduled for

## 2022-09-25 ENCOUNTER — Other Ambulatory Visit: Payer: Self-pay

## 2022-09-25 ENCOUNTER — Encounter (HOSPITAL_COMMUNITY)
Admission: RE | Disposition: A | Discharge status: Home / Self Care | Payer: Self-pay | Source: Ambulatory Visit | Attending: Cardiovascular Disease

## 2022-09-25 ENCOUNTER — Ambulatory Visit (HOSPITAL_COMMUNITY)
Admission: RE | Admit: 2022-09-25 | Discharge: 2022-09-25 | Disposition: A | Discharge status: Home / Self Care | Payer: Medicare Other | Source: Ambulatory Visit | Attending: Cardiovascular Disease | Admitting: Cardiovascular Disease

## 2022-09-25 DIAGNOSIS — Z955 Presence of coronary angioplasty implant and graft: Secondary | ICD-10-CM | POA: Insufficient documentation

## 2022-09-25 DIAGNOSIS — Z87891 Personal history of nicotine dependence: Secondary | ICD-10-CM | POA: Insufficient documentation

## 2022-09-25 DIAGNOSIS — I25700 Atherosclerosis of coronary artery bypass graft(s), unspecified, with unstable angina pectoris: Secondary | ICD-10-CM | POA: Diagnosis not present

## 2022-09-25 DIAGNOSIS — I200 Unstable angina: Secondary | ICD-10-CM

## 2022-09-25 DIAGNOSIS — E785 Hyperlipidemia, unspecified: Secondary | ICD-10-CM | POA: Diagnosis not present

## 2022-09-25 DIAGNOSIS — I2582 Chronic total occlusion of coronary artery: Secondary | ICD-10-CM | POA: Diagnosis not present

## 2022-09-25 DIAGNOSIS — I25118 Atherosclerotic heart disease of native coronary artery with other forms of angina pectoris: Secondary | ICD-10-CM | POA: Diagnosis not present

## 2022-09-25 DIAGNOSIS — I10 Essential (primary) hypertension: Secondary | ICD-10-CM | POA: Diagnosis not present

## 2022-09-25 HISTORY — PX: RIGHT/LEFT HEART CATH AND CORONARY/GRAFT ANGIOGRAPHY: CATH118267

## 2022-09-25 LAB — POCT I-STAT EG7
Acid-Base Excess: 0 mmol/L (ref 0.0–2.0)
Acid-Base Excess: 0 mmol/L (ref 0.0–2.0)
Bicarbonate: 25.2 mmol/L (ref 20.0–28.0)
Bicarbonate: 24.9 mmol/L (ref 20.0–28.0)
Calcium, Ion: 1.16 mmol/L (ref 1.15–1.40)
Calcium, Ion: 1.13 mmol/L — ABNORMAL LOW (ref 1.15–1.40)
HCT: 39 % (ref 39.0–52.0)
HCT: 38 % — ABNORMAL LOW (ref 39.0–52.0)
Hemoglobin: 13.3 g/dL (ref 13.0–17.0)
Hemoglobin: 12.9 g/dL — ABNORMAL LOW (ref 13.0–17.0)
O2 Saturation: 72 %
O2 Saturation: 76 %
Potassium: 3.9 mmol/L (ref 3.5–5.1)
Potassium: 3.9 mmol/L (ref 3.5–5.1)
Sodium: 138 mmol/L (ref 135–145)
Sodium: 139 mmol/L (ref 135–145)
TCO2: 27 mmol/L (ref 22–32)
TCO2: 26 mmol/L (ref 22–32)
pCO2, Ven: 43.9 mmHg — ABNORMAL LOW (ref 44–60)
pCO2, Ven: 42.4 mmHg — ABNORMAL LOW (ref 44–60)
pH, Ven: 7.367 (ref 7.25–7.43)
pH, Ven: 7.377 (ref 7.25–7.43)
pO2, Ven: 40 mmHg (ref 32–45)
pO2, Ven: 42 mmHg (ref 32–45)

## 2022-09-25 LAB — POCT I-STAT 7, (LYTES, BLD GAS, ICA,H+H)
Acid-base deficit: 1 mmol/L (ref 0.0–2.0)
Bicarbonate: 24.1 mmol/L (ref 20.0–28.0)
Calcium, Ion: 1.16 mmol/L (ref 1.15–1.40)
HCT: 38 % — ABNORMAL LOW (ref 39.0–52.0)
Hemoglobin: 12.9 g/dL — ABNORMAL LOW (ref 13.0–17.0)
O2 Saturation: 97 %
Potassium: 3.9 mmol/L (ref 3.5–5.1)
Sodium: 138 mmol/L (ref 135–145)
TCO2: 25 mmol/L (ref 22–32)
pCO2 arterial: 41.7 mmHg (ref 32–48)
pH, Arterial: 7.369 (ref 7.35–7.45)
pO2, Arterial: 97 mmHg (ref 83–108)

## 2022-09-25 SURGERY — RIGHT/LEFT HEART CATH AND CORONARY/GRAFT ANGIOGRAPHY
Anesthesia: LOCAL

## 2022-09-25 MED ORDER — SODIUM CHLORIDE 0.9% FLUSH: 3.0000 mL | INTRAVENOUS | Status: DC | PRN

## 2022-09-25 MED ORDER — ASPIRIN 81 MG PO CHEW: 81.0000 mg | CHEWABLE_TABLET | ORAL | Status: DC

## 2022-09-25 MED ORDER — SODIUM CHLORIDE 0.9 % IV SOLN: INTRAVENOUS | Status: AC

## 2022-09-25 MED ORDER — HEPARIN (PORCINE) IN NACL 1000-0.9 UT/500ML-% IV SOLN
INTRAVENOUS | Status: DC | PRN
  Administered 2022-09-25 (×2): 500 mL

## 2022-09-25 MED ORDER — MIDAZOLAM HCL 2 MG/2ML IJ SOLN
INTRAMUSCULAR | Status: DC | PRN
  Administered 2022-09-25: 1 mg via INTRAVENOUS

## 2022-09-25 MED ORDER — IOHEXOL 350 MG/ML SOLN
INTRAVENOUS | Status: DC | PRN
  Administered 2022-09-25: 80 mL

## 2022-09-25 MED ORDER — SODIUM CHLORIDE 0.9 % IV SOLN: 250.0000 mL | INTRAVENOUS | Status: DC | PRN

## 2022-09-25 MED ORDER — SODIUM CHLORIDE 0.9% FLUSH: 3.0000 mL | Freq: Two times a day (BID) | INTRAVENOUS | Status: DC

## 2022-09-25 MED ORDER — ONDANSETRON HCL 4 MG/2ML IJ SOLN: 4.0000 mg | Freq: Four times a day (QID) | INTRAMUSCULAR | Status: DC | PRN

## 2022-09-25 MED ORDER — LIDOCAINE HCL (PF) 1 % IJ SOLN
INTRAMUSCULAR | Status: AC
  Filled 2022-09-25: qty 30

## 2022-09-25 MED ORDER — FENTANYL CITRATE (PF) 100 MCG/2ML IJ SOLN
INTRAMUSCULAR | Status: DC | PRN
  Administered 2022-09-25: 25 ug via INTRAVENOUS

## 2022-09-25 MED ORDER — LABETALOL HCL 5 MG/ML IV SOLN: 10.0000 mg | INTRAVENOUS | Status: DC | PRN

## 2022-09-25 MED ORDER — LIDOCAINE HCL (PF) 1 % IJ SOLN
INTRAMUSCULAR | Status: DC | PRN
  Administered 2022-09-25: 10 mL
  Administered 2022-09-25: 2 mL

## 2022-09-25 MED ORDER — HYDRALAZINE HCL 20 MG/ML IJ SOLN: 10.0000 mg | INTRAMUSCULAR | Status: DC | PRN

## 2022-09-25 MED ORDER — HEPARIN (PORCINE) IN NACL 1000-0.9 UT/500ML-% IV SOLN
INTRAVENOUS | Status: AC
  Filled 2022-09-25: qty 1000

## 2022-09-25 MED ORDER — FENTANYL CITRATE (PF) 100 MCG/2ML IJ SOLN
INTRAMUSCULAR | Status: AC
  Filled 2022-09-25: qty 2

## 2022-09-25 MED ORDER — SODIUM CHLORIDE 0.9 % WEIGHT BASED INFUSION: 1.0000 mL/kg/h | INTRAVENOUS | Status: DC

## 2022-09-25 MED ORDER — MIDAZOLAM HCL 2 MG/2ML IJ SOLN
INTRAMUSCULAR | Status: AC
  Filled 2022-09-25: qty 2

## 2022-09-25 MED ORDER — ACETAMINOPHEN 325 MG PO TABS: 650.0000 mg | ORAL_TABLET | ORAL | Status: DC | PRN

## 2022-09-25 MED ORDER — SODIUM CHLORIDE 0.9 % WEIGHT BASED INFUSION
3.0000 mL/kg/h | INTRAVENOUS | Status: AC
  Administered 2022-09-25: 3 mL/kg/h via INTRAVENOUS

## 2022-09-25 SURGICAL SUPPLY — 16 items
CATH INFINITI 5 FR IM (CATHETERS) IMPLANT
CATH INFINITI 5FR AL1 (CATHETERS) IMPLANT
CATH INFINITI 5FR MULTPACK ANG (CATHETERS) IMPLANT
CATH SWAN GANZ 7F STRAIGHT (CATHETERS) IMPLANT
CLOSURE MYNX CONTROL 5F (Vascular Products) IMPLANT
GLIDESHEATH SLENDER 7FR .021G (SHEATH) IMPLANT
GUIDEWIRE ANGLED .035X150CM (WIRE) IMPLANT
KIT HEART LEFT (KITS) ×2 IMPLANT
KIT MICROPUNCTURE NIT STIFF (SHEATH) IMPLANT
PACK CARDIAC CATHETERIZATION (CUSTOM PROCEDURE TRAY) ×2 IMPLANT
SHEATH GLIDE SLENDER 4/5FR (SHEATH) IMPLANT
SHEATH PINNACLE 5F 10CM (SHEATH) IMPLANT
SHEATH PROBE COVER 6X72 (BAG) IMPLANT
TRANSDUCER W/STOPCOCK (MISCELLANEOUS) ×2 IMPLANT
TUBING CIL FLEX 10 FLL-RA (TUBING) ×2 IMPLANT
WIRE EMERALD 3MM-J .035X150CM (WIRE) IMPLANT

## 2022-09-25 NOTE — Progress Notes (Signed)
Post ambulation done at this time, no bleeding or hematoma noted to site. Will continue to monitor.

## 2022-09-25 NOTE — Interval H&P Note (Signed)
History and Physical Interval Note:  09/25/2022 9:36 AM  Joshua [name].  has presented today for surgery, with the diagnosis of Unstable Angina.  The various methods of treatment have been discussed with the patient and family. After consideration of risks, benefits and other options for treatment, the patient has consented to  Procedure(s): RIGHT/LEFT HEART CATH AND CORONARY/GRAFT ANGIOGRAPHY (N/A) as a surgical intervention.  The patient's history has been reviewed, patient examined, no change in status, stable for surgery.  I have reviewed the patient's chart and labs.  Questions were answered to the patient's satisfaction.    Cath Lab Visit (complete for each Cath Lab visit)  Clinical Evaluation Leading to the Procedure:   ACS: No.  Non-ACS:    Anginal Classification: CCS III  Anti-ischemic medical therapy: Maximal Therapy (2 or more classes of medications)  Non-Invasive Test Results: No non-invasive testing performed  Prior CABG: Previous CABG        Joshua [name]

## 2022-09-25 NOTE — Progress Notes (Signed)
Pt and wife received d/c instructions, written and verbal. All questions and concerns addressed. PIV removed and intact. 

## 2022-09-26 ENCOUNTER — Encounter (HOSPITAL_COMMUNITY): Payer: Self-pay | Admitting: Cardiovascular Disease

## 2022-09-30 DIAGNOSIS — G4733 Obstructive sleep apnea (adult) (pediatric): Secondary | ICD-10-CM | POA: Diagnosis not present

## 2022-10-15 ENCOUNTER — Encounter: Payer: Self-pay | Admitting: Student

## 2022-10-15 ENCOUNTER — Ambulatory Visit: Payer: Medicare Other | Attending: Physician Assistant | Admitting: Student

## 2022-10-15 VITALS — BP 120/60 | HR 60 | Ht 68.0 in | Wt 234.4 lb

## 2022-10-15 DIAGNOSIS — I25119 Atherosclerotic heart disease of native coronary artery with unspecified angina pectoris: Secondary | ICD-10-CM

## 2022-10-15 DIAGNOSIS — R079 Chest pain, unspecified: Secondary | ICD-10-CM

## 2022-10-15 DIAGNOSIS — E785 Hyperlipidemia, unspecified: Secondary | ICD-10-CM

## 2022-10-15 DIAGNOSIS — I10 Essential (primary) hypertension: Secondary | ICD-10-CM

## 2022-10-15 DIAGNOSIS — G4733 Obstructive sleep apnea (adult) (pediatric): Secondary | ICD-10-CM

## 2022-10-15 MED ORDER — RANOLAZINE ER 500 MG PO TB12: 500.0000 mg | ORAL_TABLET | Freq: Two times a day (BID) | ORAL | 11 refills | Status: DC

## 2022-10-15 NOTE — Progress Notes (Signed)
Cardiology Clinic Note   Patient Name: Joshua [name]. Date of Encounter: 10/15/2022  Primary Care Provider:  Dickie La, MD Primary Cardiologist:  Sinclair Grooms, MD  Patient Profile    Joshua [name] is a 81 year-old male with a past medical history of CAD s/p CABG x 4 in 1990, hypertension, hyperlipidemia who presents to the clinic today for hospital follow-up post left heart catheterization.   Past Medical History    Past Medical History:  Diagnosis Date   Coronary artery disease involving native coronary artery of native heart with angina pectoris ([place])    a. 1990 s/p CABG x 4 (RIMA->LAD, LIMA->LCX, VG->Diag, VG->RCA); b. 2011 s/p BMS to VG->RCA.; c. Unstable Angina 05/2017: Patent RIMA-LAD & LIMA-LCx, ostSVG-RCA 65% ISR & mSVG-RCA  99% - DES PCI to both // Myoview 05/2019:  EF 57, no ischemia or scar, Low Risk    GERD (gastroesophageal reflux disease)    History of echocardiogram    a. 01/2017 Echo: EF 55-60%, no rwma, mild MR, midlly dil LA, PASP 15mHg.   History of hiatal hernia    Hyperlipidemia    Hypertension    Obesity    Past Surgical History:  Procedure Laterality Date   CATARACT EXTRACTION W/ INTRAOCULAR LENS  IMPLANT, BILATERAL Bilateral    CORONARY ANGIOPLASTY WITH STENT PLACEMENT  2011   "RCA"   CORONARY ARTERY BYPASS GRAFT  1990   CABG X4   CORONARY STENT INTERVENTION N/A 06/24/2017   Procedure: Coronary Stent Intervention;  Surgeon: SBelva Crome MD;  [facility];  Service: Cardiovascular: Ostial 4.0 x 12 mm Onyx DES reducing 70% stenosis to less than 40%. Distal body 3.5 x 15 Onyx DES reducing 99% stenosis to less than 30%.   LEFT HEART CATH AND CORS/GRAFTS ANGIOGRAPHY N/A 06/24/2017   Procedure: Left Heart Cath and Cors/Grafts Angiography;  Surgeon: SBelva Crome MD;  [facility];  Service: Cardiovascular:  CTO native RCA, LAD & LCx (Graft Dependent). Patent LIMA-LCx & RIMA-LAD.  SVG-RCA: ost 65% ISR & mid 99%  --> DES PCI to both   LEFT HEART CATH AND CORS/GRAFTS ANGIOGRAPHY N/A 01/04/2021   Procedure: LEFT HEART CATH AND CORS/GRAFTS ANGIOGRAPHY;  Surgeon: SBelva Crome MD;  [facility];  Service: Cardiovascular;  Laterality: N/A;   RIGHT/LEFT HEART CATH AND CORONARY/GRAFT ANGIOGRAPHY N/A 09/25/2022   Procedure: RIGHT/LEFT HEART CATH AND CORONARY/GRAFT ANGIOGRAPHY;  Surgeon: MBurnell Blanks MD;  [facility];  Service: Cardiovascular;  Laterality: N/A;    Allergies  Allergies  Allergen Reactions   Ace Inhibitors Cough   Atorvastatin Other (See Comments)    Muscle pain   Rosuvastatin Other (See Comments)    Muscle pain   Plavix [Clopidogrel Bisulfate] Itching and Rash   Sulfa Antibiotics Itching and Rash   Sulfonamide Derivatives Itching and Rash    History of Present Illness    Mr. CHeadenhas a past medical history of: CAD. CABG x 4 1990: RIMA to LAD, LIMA to Cx, SVG to diagonal, SVG to RCA. LHC 2011: BMS to SVG RCA.  LHC  06/24/2017: ACS/NSTEMI high-grade obstruction SVG RCA. In-stent restenosis in proximal/ostial RCA graft 60-70%, 99% graft obstruction, graft heavily calcified. Total occlusion SVG to diagonal. Patent RIMA to LAD and LIMA to OM.  LHC 01/24/2021: Ostial occlusion LM and native RCA. Occlusion SVG to diagonal. Patent RIMA to mid LAD. Patent LIMA to Cx marginal. Patent SVG  to distal RCA could not be selectively engaged due to overhanging ostial stents. Distal two-thirds of RCA appear widely patent. Could be high-grade obstruction at the distal anastomosis or in native RCA.  R/LHC 09/25/2022: Severe native CAD with chronic occlusion of the LM and proximal RCA s/p 4V CABG with 3/4 patent grafts. Normal right heart pressures, mild elevation LVEDP 19-23 mmHg.  Hypertension. Hyperlipidemia. Lipid panel 09/22/2022: LDL 85, HDL 28, TG 159, total cholesterol 141. OSA. CPAP 100%  adherence.  Joshua [name] has a long standing history of  hypertension and CAD with CABG x 4 in 1990. He re-established care with cardiology in 2018. He was last seen in the office by Dr. Acie Fredrickson on 09/22/2022 with complaints of exertional chest tightness and dyspnea similar to anginal symptoms prior to 1990 CABG. He was set up for outpatient right/left heart catheterization which showed severe native CAD with chronic occlusion of LM and proximal RCA s/p 4V CABG with 3/4 patent grafts. Continued medical management was recommended.   Today, patient is accompanied by his wife. He reports continued chest pain and dyspnea with light exertion. This is unchanged from prior to his catheterization. Walking up and down 4 steps in his home will cause the pain and dyspnea. He denies chest pain or shortness of breath at rest. He is frustrated that symptoms are persisting. Denies lower extremity edema, orthopnea, or PND. No palpitations. He denies any issues with right groin and right AC cath sites.     Home Medications    Current Meds  Medication Sig   ALPRAZolam (XANAX) 0.5 MG tablet TAKE 1 TABLET BY MOUTH AT  BEDTIME AS NEEDED FOR RESTLESS  LEGS   aspirin EC 81 MG EC tablet Take 1 tablet (81 mg total) by mouth daily.   atenolol-chlorthalidone (TENORETIC) 100-25 MG tablet TAKE 1 TABLET BY MOUTH  DAILY   citalopram (CELEXA) 20 MG tablet TAKE 1 TABLET BY MOUTH  DAILY   dapagliflozin propanediol (FARXIGA) 10 MG TABS tablet Take 1 tablet (10 mg total) by mouth daily before breakfast.   esomeprazole (NEXIUM) 20 MG capsule Take 1 capsule (20 mg total) by mouth daily.   ezetimibe (ZETIA) 10 MG tablet TAKE 1 TABLET BY MOUTH  DAILY   gabapentin (NEURONTIN) 400 MG capsule Take 400 mg by mouth See admin instructions. 800 mg in the morning, 400 mg  at noon, and 800 mg  at bedtime   isosorbide mononitrate (IMDUR) 120 MG 24 hr tablet TAKE 1 TABLET BY MOUTH DAILY   nitroGLYCERIN (NITROSTAT) 0.4 MG SL tablet Place 1 tablet (0.4 mg total) under the tongue every 5 (five) minutes as  needed for chest pain.   rosuvastatin (CRESTOR) 20 MG tablet Take 1 tablet (20 mg total) by mouth daily.    Family History    Family History  Problem Relation Age of Onset   Cancer Mother        ovarian   Hyperlipidemia Son    Hypertension Son    Heart attack Father    Coronary artery disease Father    He indicated that his mother is deceased. He indicated that his father is deceased. He indicated that his brother is alive. He indicated that his maternal grandmother is deceased. He indicated that his maternal grandfather is deceased. He indicated that his paternal grandmother is deceased. He indicated that his paternal grandfather is deceased. He indicated that his daughter is alive. He indicated that his son is alive.   Social History    Social History  Socioeconomic History   Marital status: Married    Spouse name: Not on file   Number of children: Not on file   Years of education: Not on file   Highest education level: Not on file  Occupational History   Occupation: retired    Comment: manual labor (truck driver, heating and air)  Tobacco Use   Smoking status: Never   Smokeless tobacco: Former    Types: Chew    Quit date: 2000  Vaping Use   Vaping Use: Never used  Substance and Sexual Activity   Alcohol use: Yes    Alcohol/week: 0.0 standard drinks of alcohol    Comment: 06/23/2017 "a few drinks/ month   Drug use: No   Sexual activity: Not Currently  Other Topics Concern   Not on file  Social History Narrative   Lives locally with his wife [name] - former [employer] ECG tech).  Does not routinely exercise.   Social Determinants of Health   Financial Resource Strain: Not on file  Food Insecurity: Not on file  Transportation Needs: Not on file  Physical Activity: Not on file  Stress: Not on file  Social Connections: Not on file  Intimate Partner Violence: Not on file     Review of Systems    General:  No chills, fever, night sweats or weight changes.   Cardiovascular:  Postive chest pain, dyspnea on exertion. No edema, orthopnea, palpitations, paroxysmal nocturnal dyspnea. Dermatological: No rash, lesions/masses Respiratory: No cough, dyspnea Urologic: No hematuria, dysuria Abdominal:   No nausea, vomiting, diarrhea, bright red blood per rectum, melena, or hematemesis Neurologic:  No visual changes, weakness, changes in mental status. All other systems reviewed and are otherwise negative except as noted above.  Physical Exam    VS:  BP 120/60   Pulse 60   Ht '5\' 8"'$  (1.727 m)   Wt 234 lb 6.4 oz (106.3 kg)   SpO2 95%   BMI 35.64 kg/m  , BMI Body mass index is 35.64 kg/m. GEN:  Well nourished, well developed, in no acute distress. HEENT: Normal. Neck: Supple, no JVD, carotid bruits, or masses. Cardiac: RRR, no murmurs, rubs, or gallops. No clubbing, cyanosis, edema.  Radials/DP/PT 2+ and equal bilaterally.  Respiratory:  Respirations regular and unlabored, clear to auscultation bilaterally. GI: Soft, nontender, nondistended. MS: No deformity or atrophy. Skin: Warm and dry, no rash. Neuro: Strength and sensation are intact. Psych: Normal affect.  Accessory Clinical Findings    The following studies were reviewed for this visit: R/LHC 09/25/2022:     Mid LM to Dist LM lesion is 100% stenosed.   Ost LAD to Mid LAD lesion is 100% stenosed.   Ramus lesion is 100% stenosed.   Ost Cx to Prox Cx lesion is 100% stenosed.   Ost RCA to Prox RCA lesion is 100% stenosed.   Origin to Prox Graft lesion is 100% stenosed (SVG to Diagonal)   Origin lesion is 50% stenosed.   Mid Graft lesion is 30% stenosed.   Dist Graft lesion is 40% stenosed.   Non-stenotic Dist RCA lesion.   LIMA to OM patent   RIMA to LAD patent   SVG to PDA patent   Severe native CAD with chronic occlusion of the left main and proximal RCA s/p 4V CABG with 3/4 patent grafts Patent LIMA graft to OM Patent RIMA graft to LAD Patent SVG to PDA with patent stents  within the body of the graft Occluded SVG to Diagonal Normal right heart pressures  Mild elevation LVEDP (19-23 mmHg)   Recommendations: Continue medical management of CAD.   Recent Labs: 09/22/2022: ALT 24; BUN 16; Creatinine, Ser 0.94; Platelets 195 09/25/2022: Hemoglobin 12.9; Potassium 3.9; Sodium 138   Recent Lipid Panel    Component Value Date/Time   CHOL 141 09/22/2022 1633   TRIG 159 (H) 09/22/2022 1633   HDL 28 (L) 09/22/2022 1633   CHOLHDL 5.0 09/22/2022 1633   CHOLHDL 6.0 01/05/2021 0214   VLDL 59 (H) 01/05/2021 0214   LDLCALC 85 09/22/2022 1633   LDLDIRECT 87 05/30/2020 1231   LDLDIRECT 84 05/14/2016 1234         ECG is not indicated today.    Assessment & Plan   Chest pain/CAD. CABG x 4 in 1990. [facility] October 2023 Severe native CAD with chronic occlusion of the LM and proximal RCA s/p 4V CABG with 3/4 patent grafts. Normal right heart pressures, mild elevation LVEDP 19-23 mmHg. Continued exertional chest pain and dyspnea. Will add Renexa 500 mg twice a day. If cost of Ranexa is an issue may consider amlodipine 2.5 mg. Continue aspirin, tenoretic, imdur, crestor, and zetia. Will obtain an EKG in one month.  Hyperlipidemia. Lipid panel 09/22/2022 LDL 85, not at goal. Patient was recently changed from simvastatin to crestor. Instructed to continue crestor and zetia.  He is scheduled to have repeat lipids and ALT in January.  Hypertension. BP 120/60 today. Continue Tenoretic 100/25 mg.  OSA. Patient reports 100% compliance with CPAP since getting started in August. He is encouraged to continue.      Disposition: EKG in one month. Follow-up in 3 months or sooner as needed.    Justice Britain. Francile Woolford, NP-C     10/15/2022, 3:30 PM [place] [address] Office [phone] Fax [phone]   I spent 12 minutes examining this patient, reviewing medications, and using patient centered shared decision making involving her  cardiac care.  Prior to her visit I spent greater than 20 minutes reviewing her past medical history,  medications, and prior cardiac tests.

## 2022-10-15 NOTE — Patient Instructions (Signed)
Medication Instructions:  Your physician has recommended you make the following change in your medication:   START Ranexa 500 taking 1 twice a day    *If you need a refill on your cardiac medications before your next appointment, please call your pharmacy*   Lab Work: None ordered  If you have labs (blood work) drawn today and your tests are completely normal, you will receive your results only by: [place] (if you have MyChart) OR A paper copy in the mail If you have any lab test that is abnormal or we need to change your treatment, we will call you to review the results.   Testing/Procedures: None ordered   Follow-Up: At [facility], you and your health needs are our priority.  As part of our continuing mission to provide you with exceptional heart care, we have created designated Provider Care Teams.  These Care Teams include your primary Cardiologist (physician) and Advanced Practice Providers (APPs -  Physician Assistants and Nurse Practitioners) who all work together to provide you with the care you need, when you need it.  We recommend signing up for the patient portal called "MyChart".  Sign up information is provided on this After Visit Summary.  MyChart is used to connect with patients for Virtual Visits (Telemedicine).  Patients are able to view lab/test results, encounter notes, upcoming appointments, etc.  Non-urgent messages can be sent to your provider as well.   To learn more about what you can do with MyChart, go to [email or web address].    Your next appointment:   1 MONTH FOR A NURSE VISIT FOR EKG  3 month(s)  The format for your next appointment:   In Person  Provider:   Dr. Gasper Sells [facility] referral)     Other Instructions   Important Information About [place]

## 2022-10-28 ENCOUNTER — Telehealth: Payer: Self-pay | Admitting: *Deleted

## 2022-10-28 ENCOUNTER — Ambulatory Visit: Payer: Medicare Other | Attending: Cardiology | Admitting: Cardiology

## 2022-10-28 ENCOUNTER — Encounter: Payer: Self-pay | Admitting: Cardiology

## 2022-10-28 VITALS — Ht 66.0 in | Wt 228.0 lb

## 2022-10-28 DIAGNOSIS — I10 Essential (primary) hypertension: Secondary | ICD-10-CM

## 2022-10-28 DIAGNOSIS — I25119 Atherosclerotic heart disease of native coronary artery with unspecified angina pectoris: Secondary | ICD-10-CM

## 2022-10-28 DIAGNOSIS — G4733 Obstructive sleep apnea (adult) (pediatric): Secondary | ICD-10-CM

## 2022-10-28 NOTE — Progress Notes (Signed)
SLEEP MEDICINE VIRTUAL CONSULT NOTEvia Video Note   Because of Joshua T Carriker Jr.'s co-morbid illnesses, he is at least at moderate risk for complications without adequate follow up.  This format is felt to be most appropriate for this patient at this time.  All issues noted in this document were discussed and addressed.  A limited physical exam was performed with this format.  Please refer to the patient's chart for his consent to telehealth for [facility].      Date:  10/28/2022   ID:  Joshua Pillow., DOB Jun 21, 1941, MRN [id number] The patient was identified using 2 identifiers.  Patient Location: Home Provider Location: Home Office   PCP:  Dickie La, MD   [place] [facility] Cardiologist:  Sinclair Grooms, MD {     Evaluation Performed:  New Patient Evaluation  Chief Complaint:  OSA  History of Present Illness:    Joshua T Younis Mathey. is a 81 y.o. male who is being seen today for the evaluation of OSA at the request of Daneen Schick, MD.  Joshua Sawyer T Laroy Mustard. is a 81 y.o. male with a hx of ASCAD, GERD, HLD, HTN who is followed by Dr. Tamala Julian.  He was recently found on echo to have RV enlargement and a HST was ordered to rule out OSA as a cause of the RVE.  HST showed mild OSA with an AHI of 10.9/hr and nocturnal hypoxemia with O2 sats as low as 81% and < 88% for 6 min.  He was started on auto CPAP from 4 to 15cm H2O.  He is now referred to establish with Sleep Physician.   He is doing well with his PAP device and thinks that he has gotten used to it.  He tolerates the mask and feels the pressure is adequate.  Since going on PAP he feels rested in the am and has no significant daytime sleepiness.  He denies any significant mouth or nasal dryness or nasal congestion.  He does not think that he snores.     Past Medical History:  Diagnosis Date   Coronary artery disease involving native coronary artery of native heart with angina pectoris  ([place])    a. 1990 s/p CABG x 4 (RIMA->LAD, LIMA->LCX, VG->Diag, VG->RCA); b. 2011 s/p BMS to VG->RCA.; c. Unstable Angina 05/2017: Patent RIMA-LAD & LIMA-LCx, ostSVG-RCA 65% ISR & mSVG-RCA  99% - DES PCI to both // Myoview 05/2019:  EF 57, no ischemia or scar, Low Risk    GERD (gastroesophageal reflux disease)    History of echocardiogram    a. 01/2017 Echo: EF 55-60%, no rwma, mild MR, midlly dil LA, PASP 37mHg.   History of hiatal hernia    Hyperlipidemia    Hypertension    Obesity    Past Surgical History:  Procedure Laterality Date   CATARACT EXTRACTION W/ INTRAOCULAR LENS  IMPLANT, BILATERAL Bilateral    CORONARY ANGIOPLASTY WITH STENT PLACEMENT  2011   "RCA"   CORONARY ARTERY BYPASS GRAFT  1990   CABG X4   CORONARY STENT INTERVENTION N/A 06/24/2017   Procedure: Coronary Stent Intervention;  Surgeon: SBelva Crome MD;  [facility];  Service: Cardiovascular: Ostial 4.0 x 12 mm Onyx DES reducing 70% stenosis to less than 40%. Distal body 3.5 x 15 Onyx DES reducing 99% stenosis to less than 30%.   LEFT HEART CATH AND CORS/GRAFTS ANGIOGRAPHY N/A 06/24/2017   Procedure: Left Heart  Cath and Cors/Grafts Angiography;  Surgeon: Belva Crome, MD;  Location: [facility];  Service: Cardiovascular:  CTO native RCA, LAD & LCx (Graft Dependent). Patent LIMA-LCx & RIMA-LAD.  SVG-RCA: ost 65% ISR & mid 99% --> DES PCI to both   LEFT HEART CATH AND CORS/GRAFTS ANGIOGRAPHY N/A 01/04/2021   Procedure: LEFT HEART CATH AND CORS/GRAFTS ANGIOGRAPHY;  Surgeon: Belva Crome, MD;  Location: [facility];  Service: Cardiovascular;  Laterality: N/A;   RIGHT/LEFT HEART CATH AND CORONARY/GRAFT ANGIOGRAPHY N/A 09/25/2022   Procedure: RIGHT/LEFT HEART CATH AND CORONARY/GRAFT ANGIOGRAPHY;  Surgeon: Burnell Blanks, MD;  Location: [facility];  Service: Cardiovascular;  Laterality: N/A;     Current Meds  Medication Sig   ALPRAZolam (XANAX) 0.5 MG tablet TAKE 1 TABLET BY  MOUTH AT  BEDTIME AS NEEDED FOR RESTLESS  LEGS   aspirin EC 81 MG EC tablet Take 1 tablet (81 mg total) by mouth daily.   atenolol-chlorthalidone (TENORETIC) 100-25 MG tablet TAKE 1 TABLET BY MOUTH  DAILY   citalopram (CELEXA) 20 MG tablet TAKE 1 TABLET BY MOUTH  DAILY   dapagliflozin propanediol (FARXIGA) 10 MG TABS tablet Take 1 tablet (10 mg total) by mouth daily before breakfast.   esomeprazole (NEXIUM) 20 MG capsule Take 1 capsule (20 mg total) by mouth daily.   ezetimibe (ZETIA) 10 MG tablet TAKE 1 TABLET BY MOUTH  DAILY   gabapentin (NEURONTIN) 400 MG capsule Take 400 mg by mouth See admin instructions. 800 mg in the morning, 400 mg  at noon, and 800 mg  at bedtime   isosorbide mononitrate (IMDUR) 120 MG 24 hr tablet TAKE 1 TABLET BY MOUTH DAILY   nitroGLYCERIN (NITROSTAT) 0.4 MG SL tablet Place 1 tablet (0.4 mg total) under the tongue every 5 (five) minutes as needed for chest pain.   ranolazine (RANEXA) 500 MG 12 hr tablet Take 1 tablet (500 mg total) by mouth 2 (two) times daily.   rosuvastatin (CRESTOR) 20 MG tablet Take 1 tablet (20 mg total) by mouth daily.     Allergies:   Ace inhibitors, Atorvastatin, Rosuvastatin, Plavix [clopidogrel bisulfate], Sulfa antibiotics, and Sulfonamide derivatives   Social History   Tobacco Use   Smoking status: Never   Smokeless tobacco: Former    Types: Chew    Quit date: 2000  Vaping Use   Vaping Use: Never used  Substance Use Topics   Alcohol use: Yes    Alcohol/week: 0.0 standard drinks of alcohol    Comment: 06/23/2017 "a few drinks/ month   Drug use: No     Family Hx: The patient's family history includes Cancer in his mother; Coronary artery disease in his father; Heart attack in his father; Hyperlipidemia in his son; Hypertension in his son.  ROS:   Please see the history of present illness.     All other systems reviewed and are negative.   Prior Sleep studies:   The following studies were reviewed today:  HST, PAP  compliance download  Labs/Other Tests and Data Reviewed:     Recent Labs: 09/22/2022: ALT 24; BUN 16; Creatinine, Ser 0.94; Platelets 195 09/25/2022: Hemoglobin 12.9; Potassium 3.9; Sodium 138    Wt Readings from Last 3 Encounters:  10/28/22 228 lb (103.4 kg)  10/15/22 234 lb 6.4 oz (106.3 kg)  09/25/22 225 lb (102.1 kg)     Risk Assessment/Calculations:      STOP-Bang Score:  6      Objective:  Vital Signs:  Ht '5\' 6"'$  (1.676 m)   Wt 228 lb (103.4 kg)   BMI 36.80 kg/m    VITAL SIGNS:  reviewed GEN:  no acute distress EYES:  sclerae anicteric, EOMI - Extraocular Movements Intact RESPIRATORY:  normal respiratory effort, symmetric expansion CARDIOVASCULAR:  no peripheral edema SKIN:  no rash, lesions or ulcers. MUSCULOSKELETAL:  no obvious deformities. NEURO:  alert and oriented x 3, no obvious focal deficit PSYCH:  normal affect  ASSESSMENT & PLAN:    OSA - The patient is tolerating PAP therapy well without any problems. The PAP download performed by his DME was personally reviewed and interpreted by me today and showed an AHI of 0.8/hr on auto CPAP from 4 to 15 cm H2O with 100% compliance in using more than 4 hours nightly.  The patient has been using and benefiting from PAP use and will continue to benefit from therapy.  -I will get an ONO to make sure he has no residual hypoxemia on CPAP  HTN -BP controlled at home -continue prescription drug management with Tenoretic 100-'25mg'$  daily, Imdur '120mg'$  daily with PRN refills   Time:   Today, I have spent 15 minutes with the patient with telehealth technology discussing the above problems.     Medication Adjustments/Labs and Tests Ordered: Current medicines are reviewed at length with the patient today.  Concerns regarding medicines are outlined above.   Tests Ordered: No orders of the defined types were placed in this encounter.   Medication Changes: No orders of the defined types were placed in this  encounter.   Follow Up:  In Person in 1 year(s)  Signed, Fransico Him, MD  10/28/2022 9:21 AM    [place]

## 2022-10-28 NOTE — Telephone Encounter (Signed)
Order placed to [facility] via community message 

## 2022-10-28 NOTE — Telephone Encounter (Signed)
-----   Message from Tor Netters, RN sent at 10/28/2022  9:38 AM EST ----- Per Dr Radford Pax: order an ONO on CPAP.  Thanks MA

## 2022-10-28 NOTE — Patient Instructions (Addendum)
Medication Instructions:  Your physician recommends that you continue on your current medications as directed. Please refer to the Current Medication list given to you today.  *If you need a refill on your cardiac medications before your next appointment, please call your pharmacy*  Follow-Up: At [facility], you and your health needs are our priority.  As part of our continuing mission to provide you with exceptional heart care, we have created designated Provider Care Teams.  These Care Teams include your primary Cardiologist (physician) and Advanced Practice Providers (APPs -  Physician Assistants and Nurse Practitioners) who all work together to provide you with the care you need, when you need it.  We recommend signing up for the patient portal called "MyChart".  Sign up information is provided on this After Visit Summary.  MyChart is used to connect with patients for Virtual Visits (Telemedicine).  Patients are able to view lab/test results, encounter notes, upcoming appointments, etc.  Non-urgent messages can be sent to your provider as well.   To learn more about what you can do with MyChart, go to [email or web address].    Your next appointment:   One year   The format for your next appointment:    Provider:  Dr Radford Pax   Other Instructions   Important Information About Sugar

## 2022-10-29 DIAGNOSIS — G4733 Obstructive sleep apnea (adult) (pediatric): Secondary | ICD-10-CM | POA: Diagnosis not present

## 2022-10-31 IMAGING — MR MR HEAD WO/W CM
14 series · 48 of 48 positions shown · IV contrast (gadavist)
Comparison: MRI June 28, 2016.

CLINICAL DATA: Headache, new or worsening.  Vision changes.

EXAM:
MRI HEAD WITHOUT AND WITH CONTRAST
TECHNIQUE: Multiplanar, multiecho pulse sequences of the brain and surrounding
structures were obtained without and with intravenous contrast.
CONTRAST:  10mL GADAVIST GADOBUTROL 1 MMOL/ML IV SOLN

[Series 5: DWI · axial · 3.0mm · 1.36mm/px · z∈[-51,+100]mm · 5 of 108 slices shown (1 of 4)]
[im 1/108]
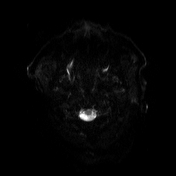
[im 27/108]
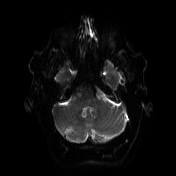
[im 54/108]
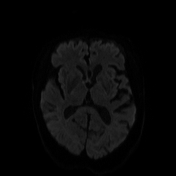
[im 81/108]
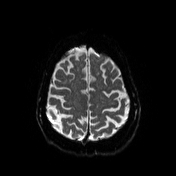
[im 108/108]
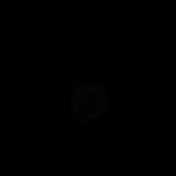

[Series 6: DWI · axial · 3.0mm · 1.36mm/px · z∈[-51,+100]mm · 3 of 54 slices shown (2 of 4)]
[im 1/54]
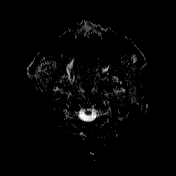
[im 27/54]
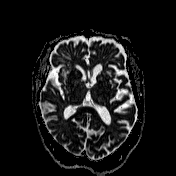
[im 54/54]
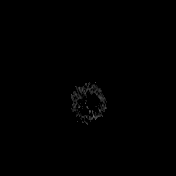

[Series 7: T1 · sagittal · 5.0mm · 0.75mm/px · 1 of 24 slices shown (1 of 2)]
[im 1/24]
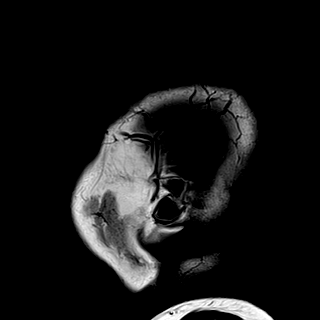

[Series 8: T2 · axial · 5.0mm · 0.62mm/px · 1 of 26 slices shown]
[im 1/26]
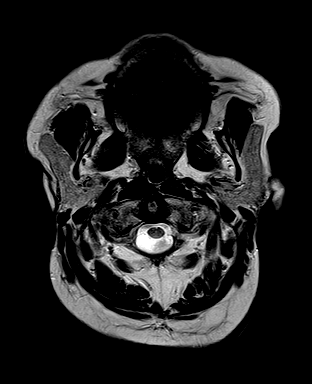

[Series 9: mip_images(sw) · axial · 24.0mm · 0.75mm/px · z∈[-52,+88]mm · 3 of 49 slices shown]
[im 1/49]
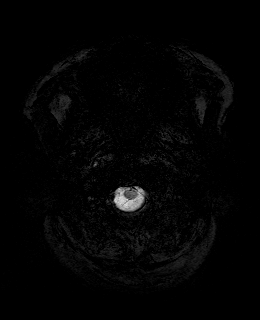
[im 25/49]
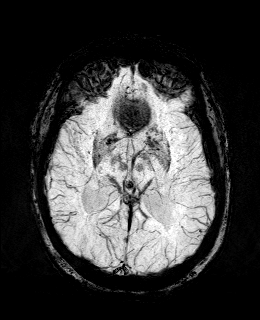
[im 49/49]
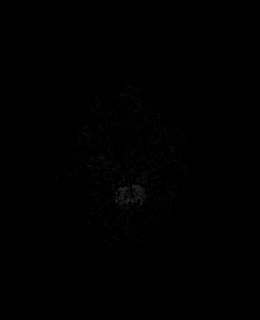

[Series 10: swi_images · axial · 3.0mm · 0.75mm/px · z∈[-62,+98]mm · 3 of 56 slices shown]
[im 1/56]
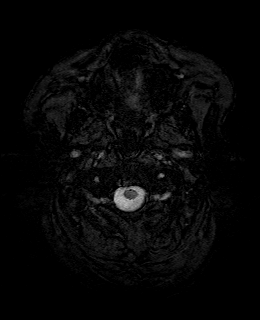
[im 28/56]
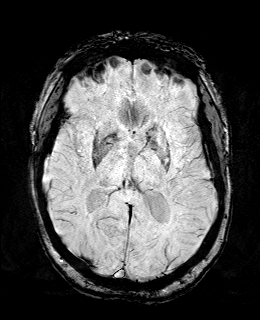
[im 56/56]
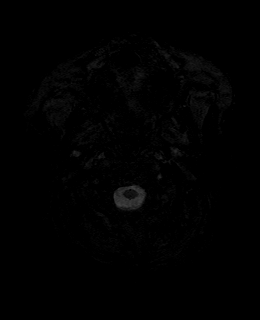

[Series 11: FLAIR · axial · 3.0mm · 0.75mm/px · z∈[-61,+96]mm · 3 of 55 slices shown]
[im 1/55]
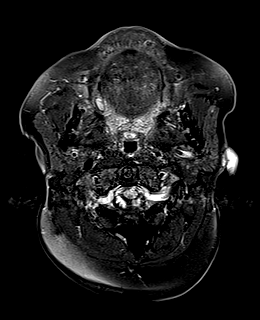
[im 28/55]
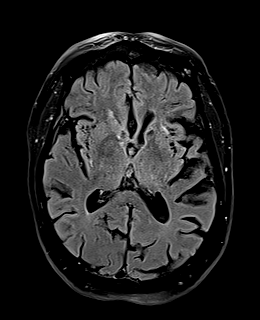
[im 55/55]
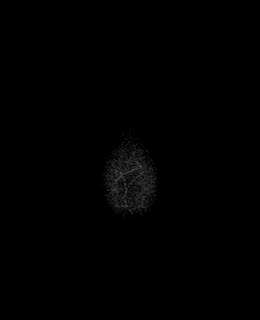

[Series 12: T1 · axial · 1.0mm · 0.94mm/px · z∈[-68,+102]mm · 9 of 176 slices shown (2 of 2)]
[im 1/176]
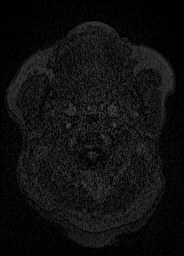
[im 22/176]
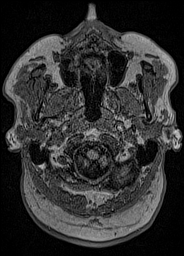
[im 44/176]
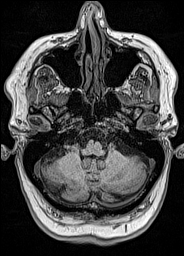
[im 66/176]
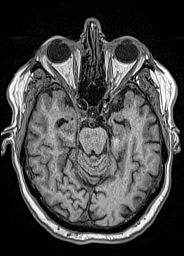
[im 88/176]
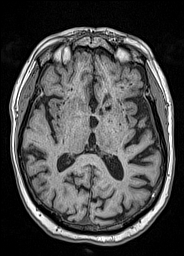
[im 110/176]
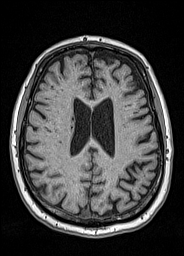
[im 132/176]
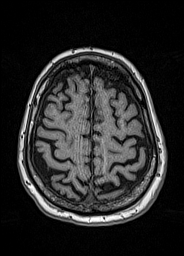
[im 154/176]
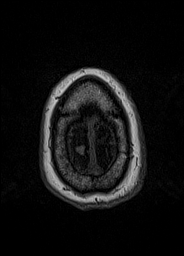
[im 176/176]
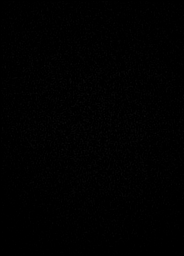

[Series 13: DWI · coronal · 5.0mm · 1.31mm/px · 4 of 72 slices shown (3 of 4)]
[im 1/72]
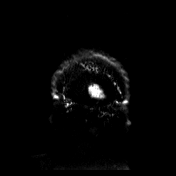
[im 24/72]
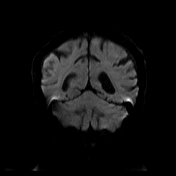
[im 48/72]
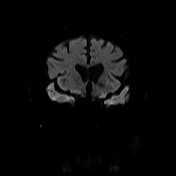
[im 72/72]
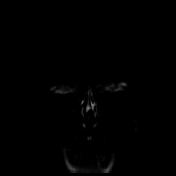

[Series 14: DWI · coronal · 5.0mm · 1.31mm/px · 2 of 36 slices shown (4 of 4)]
[im 1/36]
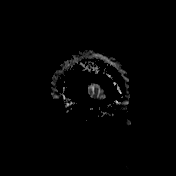
[im 36/36]
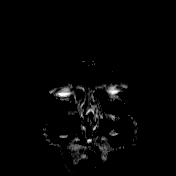

[Series 15: T2 post-contrast · coronal · 5.0mm · 0.57mm/px · 2 of 30 slices shown]
[im 1/30]
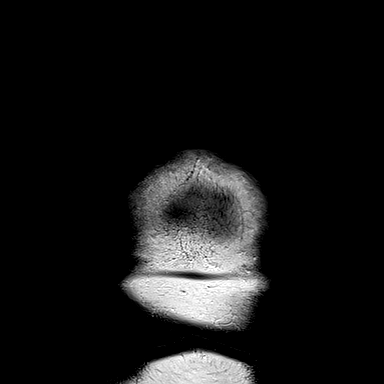
[im 30/30]
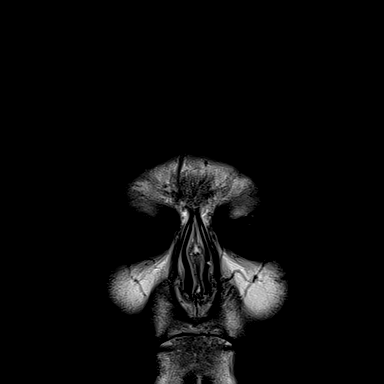

[Series 16: T1 post-contrast · axial · 1.0mm · 0.94mm/px · z∈[-68,+102]mm · 9 of 176 slices shown (1 of 3)]
[im 1/176]
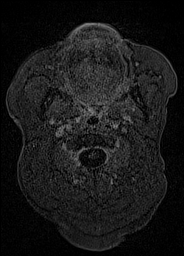
[im 22/176]
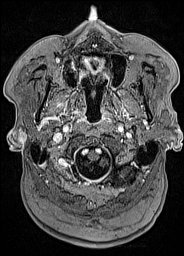
[im 44/176]
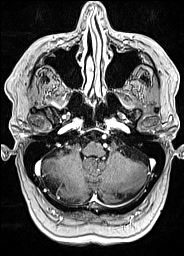
[im 66/176]
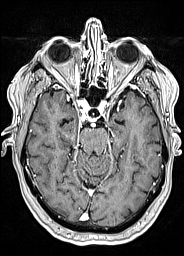
[im 88/176]
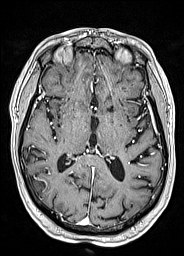
[im 110/176]
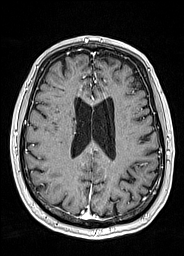
[im 132/176]
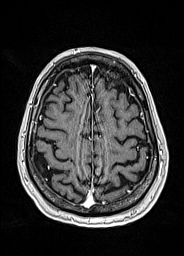
[im 154/176]
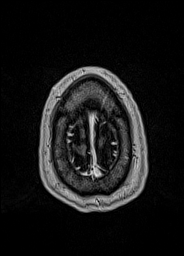
[im 176/176]
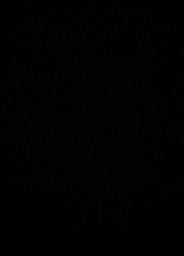

[Series 17: T1 post-contrast · coronal · 5.0mm · 0.43mm/px · 2 of 30 slices shown (2 of 3)]
[im 1/30]
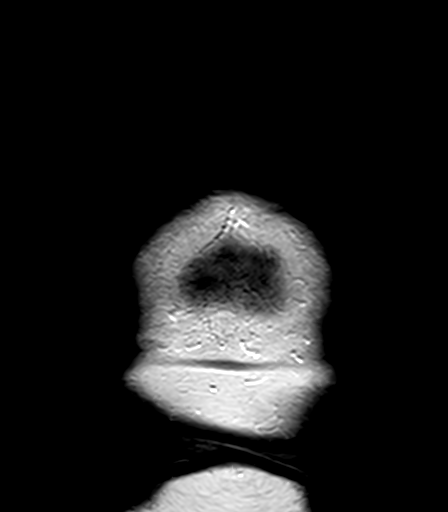
[im 30/30]
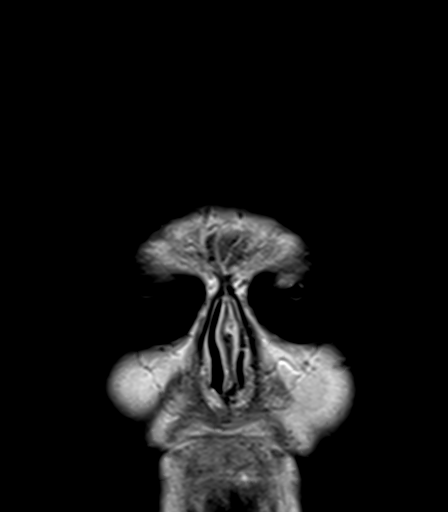

[Series 18: T1 post-contrast · sagittal · 5.0mm · 0.75mm/px · 1 of 24 slices shown (3 of 3)]
[im 1/24]
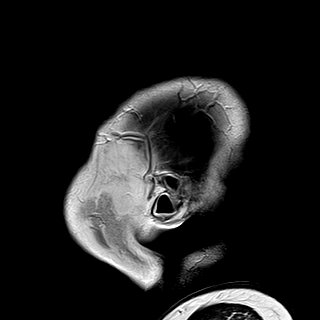

[48 of 48 positions shown; findings below may reference images not displayed]

FINDINGS: Brain: No acute infarction, hemorrhage, hydrocephalus, extra-axial
collection or mass lesion. Similar appearance of a dilated
perivascular space versus remote lacunar infarct in the left basal
ganglia. Scattered T2/FLAIR hyperintensities within the white matter
and pons, most likely related to chronic microvascular ischemic
disease. No abnormal enhancement.

Vascular: Major arterial flow voids are maintained at the skull
base.

Skull and upper cervical spine: Normal marrow signal.

Sinuses/Orbits: Mild scattered paranasal sinus mucosal thickening
without air-fluid levels. Unremarkable orbits.

Other: No sizable mastoid effusion.
IMPRESSION: 1. No evidence of acute intracranial abnormality.
2. Similar chronic microvascular ischemic disease and remote lacunar
infarct versus dilated perivascular space in the left basal ganglia.

## 2022-11-12 ENCOUNTER — Ambulatory Visit: Payer: Medicare Other

## 2022-11-12 DIAGNOSIS — G4730 Sleep apnea, unspecified: Secondary | ICD-10-CM | POA: Diagnosis not present

## 2022-11-12 DIAGNOSIS — R0683 Snoring: Secondary | ICD-10-CM | POA: Diagnosis not present

## 2022-11-17 ENCOUNTER — Ambulatory Visit: Payer: Medicare Other | Attending: Cardiovascular Disease | Admitting: Cardiovascular Disease

## 2022-11-17 VITALS — BP 122/64 | HR 56 | Resp 20 | Wt 233.0 lb

## 2022-11-17 DIAGNOSIS — I25119 Atherosclerotic heart disease of native coronary artery with unspecified angina pectoris: Secondary | ICD-10-CM | POA: Diagnosis not present

## 2022-11-17 NOTE — Patient Instructions (Addendum)
   Nurse Visit   Date of Encounter: 11/17/2022 ID: Joshua [name]., DOB [birth year], MRN [id number]  PCP:  Dickie La, MD   [place] [facility] Cardiologist:  Sinclair Grooms, MD      Visit Details   VS:  BP 122/64 (BP Location: Right Arm)   Pulse (!) 56   Resp 20   Wt 233 lb 0.2 oz (105.7 kg)   SpO2 98%   BMI 37.61 kg/m  , BMI Body mass index is 37.61 kg/m.  Wt Readings from Last 3 Encounters:  11/17/22 233 lb 0.2 oz (105.7 kg)  10/28/22 228 lb (103.4 kg)  10/15/22 234 lb 6.4 oz (106.3 kg)     Reason for visit: EKG / RN Visit, Vital sign assessment Performed today:  WT, Vitals, EKG, Provider consulted:DOD, Dr. Norberto Sorenson, and Education Changes (medications, testing, etc.) : None Length of Visit: 30 minutes    Medications Adjustments/Labs and Tests Ordered: RN visit / EKG per Mayra Reel, NP ordered 10/15/2022.  Pt started on Ranexa 500 last visit to [facility], and has history of 4 vessel CABG with recent left heart cath.  Pt was asymptomatic during time of RN visit.  Had no complaints regarding his medications or plan of care.  Pt mildly bradycardic with HR in mid 50's, but other vitals signs WNL.   EKG performed similar to EKG on 09/22/2022.  EKG and vital signs assessment taken to Dr. Norberto Sorenson, DOD.  Dr. Myles Gip stated EKG similar to EKG on 09/22/2022, and no new orders were received.  EKG / RN visit results will be forwarded to Mayra Reel, NP, and a copy of the EKG scanned into the chart for Ms. Wittenborn NP's review.  Pt advised to follow up with [facility] with any new or unexplained symptoms; Also told follow current POC.      Signed, Varney Daily, RN  11/17/2022 11:57 AM

## 2022-11-20 ENCOUNTER — Telehealth: Payer: Self-pay

## 2022-11-20 NOTE — Telephone Encounter (Signed)
Spoke with patient's wife, [name] to give results of CPAP per Dr Radford Pax.  Verbalized understanding and had no questions.

## 2022-11-26 ENCOUNTER — Ambulatory Visit (INDEPENDENT_AMBULATORY_CARE_PROVIDER_SITE_OTHER): Payer: Medicare Other

## 2022-11-26 DIAGNOSIS — Z23 Encounter for immunization: Secondary | ICD-10-CM | POA: Diagnosis not present

## 2022-11-26 NOTE — Progress Notes (Signed)
Patient presents to nurse clinic for [place] vaccination. Administered in LD, patient tolerated injection well, site unremarkable.   Patient observed for 15 minutes post injection. No signs of adverse reaction were noted.   Talbot Grumbling, RN

## 2022-12-24 ENCOUNTER — Telehealth: Payer: Self-pay

## 2022-12-24 NOTE — Telephone Encounter (Signed)
**Note De-Identified [name]** We received a letter from [facility] [employer] stating that they denied a [name] tier exception for this pt. Reason for denial: Joshua [name] is already at the lowest tier possible for a name brand medication per Medicare part D.

## 2022-12-25 ENCOUNTER — Ambulatory Visit: Payer: No Typology Code available for payment source | Attending: Cardiovascular Disease

## 2022-12-25 DIAGNOSIS — Z79899 Other long term (current) drug therapy: Secondary | ICD-10-CM

## 2022-12-25 LAB — LIPID PANEL
Chol/HDL Ratio: 4.3 ratio (ref 0.0–5.0)
Cholesterol, Total: 147 mg/dL (ref 100–199)
HDL: 34 mg/dL — ABNORMAL LOW (ref >39)
LDL Chol Calc (NIH): 91 mg/dL (ref 0–99)
Triglycerides: 122 mg/dL (ref 0–149)
VLDL Cholesterol Cal: 22 mg/dL (ref 5–40)

## 2022-12-25 LAB — ALT: ALT: 20 IU/L (ref 0–44)

## 2022-12-26 ENCOUNTER — Telehealth: Payer: Self-pay | Admitting: *Deleted

## 2022-12-26 ENCOUNTER — Telehealth: Payer: Self-pay | Admitting: Cardiovascular Disease

## 2022-12-26 DIAGNOSIS — E785 Hyperlipidemia, unspecified: Secondary | ICD-10-CM

## 2022-12-26 NOTE — Telephone Encounter (Signed)
Upon patient request DME selection is [facility]. Patient understands he will be contacted by [facility] to set up his cpap. Patient understands to call if [facility] does not contact him with new setup in a timely manner. Patient understands they will be called once confirmation has been received from [facility] that they have received their new machine to schedule 10 week follow up appointment. [facility] notified of new cpap order  Please add to airview Patient was grateful for the call and thanked me

## 2022-12-26 NOTE — Telephone Encounter (Signed)
-----  Message from Thayer Headings, MD sent at 12/25/2022  5:46 PM EST ----- He has severe CAD  LDL is 91 His goal is < 55 On rosuvastatin 20 mg a day,  Zetia 10 mg a day  Please refer him to the lipid clinic for consideration of PCSK9 inhibitor

## 2022-12-31 DIAGNOSIS — G4733 Obstructive sleep apnea (adult) (pediatric): Secondary | ICD-10-CM | POA: Diagnosis not present

## 2023-01-01 ENCOUNTER — Other Ambulatory Visit: Payer: Self-pay | Admitting: Family Medicine

## 2023-01-05 ENCOUNTER — Other Ambulatory Visit: Payer: Self-pay

## 2023-01-05 NOTE — Progress Notes (Signed)
Nurse visit

## 2023-01-26 DIAGNOSIS — G4733 Obstructive sleep apnea (adult) (pediatric): Secondary | ICD-10-CM | POA: Diagnosis not present

## 2023-01-28 ENCOUNTER — Encounter: Payer: Self-pay | Admitting: Internal Medicine

## 2023-01-28 ENCOUNTER — Ambulatory Visit: Payer: No Typology Code available for payment source | Attending: Internal Medicine | Admitting: Internal Medicine

## 2023-01-28 VITALS — BP 130/58 | HR 55 | Ht 66.5 in | Wt 236.0 lb

## 2023-01-28 DIAGNOSIS — I272 Pulmonary hypertension, unspecified: Secondary | ICD-10-CM

## 2023-01-28 DIAGNOSIS — I25119 Atherosclerotic heart disease of native coronary artery with unspecified angina pectoris: Secondary | ICD-10-CM | POA: Diagnosis not present

## 2023-01-28 DIAGNOSIS — I10 Essential (primary) hypertension: Secondary | ICD-10-CM

## 2023-01-28 DIAGNOSIS — G4733 Obstructive sleep apnea (adult) (pediatric): Secondary | ICD-10-CM | POA: Diagnosis not present

## 2023-01-28 MED ORDER — POTASSIUM CHLORIDE ER 10 MEQ PO TBCR: 10.0000 meq | EXTENDED_RELEASE_TABLET | ORAL | 3 refills | Status: DC

## 2023-01-28 MED ORDER — FUROSEMIDE 20 MG PO TABS: 20.0000 mg | ORAL_TABLET | ORAL | 3 refills | Status: DC

## 2023-01-28 NOTE — Patient Instructions (Signed)
Medication Instructions:  Your physician has recommended you make the following change in your medication:   Lasix 20 mg every other day Potassium 10 meq every other day *If you need a refill on your cardiac medications before your next appointment, please call your pharmacy*   Lab Work: BMET, BNP 10-14 days If you have labs (blood work) drawn today and your tests are completely normal, you will receive your results only by: [place] (if you have MyChart) OR A paper copy in the mail If you have any lab test that is abnormal or we need to change your treatment, we will call you to review the results.    Follow-Up: At [facility], you and your health needs are our priority.  As part of our continuing mission to provide you with exceptional heart care, we have created designated Provider Care Teams.  These Care Teams include your primary Cardiologist (physician) and Advanced Practice Providers (APPs -  Physician Assistants and Nurse Practitioners) who all work together to provide you with the care you need, when you need it.   Your next appointment:   6 month(s)  Provider:   Dr Gasper Sells or APP

## 2023-01-28 NOTE — Progress Notes (Signed)
Cardiology Office Note:    Date:  01/28/2023   ID:  Joshua [name]., DOB Jan 12, 1941, MRN [id number]  PCP:  Dickie La, MD   [place] Providers Cardiologist:  Sinclair Grooms, MD (Inactive)     Referring MD: Dickie La, MD   CC:  establish with new cardiologist  History of Present Illness:    Joshua [name]. is a 82 y.o. male with a hx of Hx of CAD with NSTEMI, with CABG in 1990 (RIMA-LAD, LIMA-> LCX, SVG-Diag (100% stenosed), SVG RCA (2011 BMS with 2018 iDES)), HTN, HLD, Mild MR. OSA on CPAP. 2024: Referred to Lipid clinic for PSCK9i (on zetia and max tolerated statin, LDL 91)  Patient notes that he is doing well.   Since last visit notes that he feels short of breath very easily. He has been short of breath for a long long time. Not very physically active.  Has bendopnea. There are no interval hospital/ED visit.    No chest pain or pressure. No PND/Orthopnea.  Denies significant weight gain but notes leg swelling.  No palpitations or syncope.    Past Medical History:  Diagnosis Date   Coronary artery disease involving native coronary artery of native heart with angina pectoris ([place])    a. 1990 s/p CABG x 4 (RIMA->LAD, LIMA->LCX, VG->Diag, VG->RCA); b. 2011 s/p BMS to VG->RCA.; c. Unstable Angina 05/2017: Patent RIMA-LAD & LIMA-LCx, ostSVG-RCA 65% ISR & mSVG-RCA  99% - DES PCI to both // Myoview 05/2019:  EF 57, no ischemia or scar, Low Risk    GERD (gastroesophageal reflux disease)    History of echocardiogram    a. 01/2017 Echo: EF 55-60%, no rwma, mild MR, midlly dil LA, PASP 78mHg.   History of hiatal hernia    Hyperlipidemia    Hypertension    Obesity     Past Surgical History:  Procedure Laterality Date   CATARACT EXTRACTION W/ INTRAOCULAR LENS  IMPLANT, BILATERAL Bilateral    CORONARY ANGIOPLASTY WITH STENT PLACEMENT  2011   "RCA"   CORONARY ARTERY BYPASS GRAFT  1990   CABG X4   CORONARY STENT INTERVENTION N/A 06/24/2017    Procedure: Coronary Stent Intervention;  Surgeon: SBelva Crome MD;  [facility];  Service: Cardiovascular: Ostial 4.0 x 12 mm Onyx DES reducing 70% stenosis to less than 40%. Distal body 3.5 x 15 Onyx DES reducing 99% stenosis to less than 30%.   LEFT HEART CATH AND CORS/GRAFTS ANGIOGRAPHY N/A 06/24/2017   Procedure: Left Heart Cath and Cors/Grafts Angiography;  Surgeon: SBelva Crome MD;  [facility];  Service: Cardiovascular:  CTO native RCA, LAD & LCx (Graft Dependent). Patent LIMA-LCx & RIMA-LAD.  SVG-RCA: ost 65% ISR & mid 99% --> DES PCI to both   LEFT HEART CATH AND CORS/GRAFTS ANGIOGRAPHY N/A 01/04/2021   Procedure: LEFT HEART CATH AND CORS/GRAFTS ANGIOGRAPHY;  Surgeon: SBelva Crome MD;  [facility];  Service: Cardiovascular;  Laterality: N/A;   RIGHT/LEFT HEART CATH AND CORONARY/GRAFT ANGIOGRAPHY N/A 09/25/2022   Procedure: RIGHT/LEFT HEART CATH AND CORONARY/GRAFT ANGIOGRAPHY;  Surgeon: MBurnell Blanks MD;  [facility];  Service: Cardiovascular;  Laterality: N/A;    Current Medications: Current Meds  Medication Sig   ALPRAZolam (XANAX) 0.5 MG tablet TAKE 1 TABLET BY MOUTH AT  BEDTIME AS NEEDED FOR RESTLESS  LEGS   aspirin EC 81 MG EC tablet Take 1 tablet (81  mg total) by mouth daily.   atenolol-chlorthalidone (TENORETIC) 100-25 MG tablet TAKE 1 TABLET BY MOUTH  DAILY   citalopram (CELEXA) 20 MG tablet TAKE 1 TABLET BY MOUTH  DAILY   dapagliflozin propanediol (FARXIGA) 10 MG TABS tablet Take 1 tablet (10 mg total) by mouth daily before breakfast.   esomeprazole (NEXIUM) 20 MG capsule Take 1 capsule (20 mg total) by mouth daily.   ezetimibe (ZETIA) 10 MG tablet TAKE 1 TABLET BY MOUTH  DAILY   furosemide (LASIX) 20 MG tablet Take 1 tablet (20 mg total) by mouth every other day.   gabapentin (NEURONTIN) 400 MG capsule TAKE 1 CAPSULE BY MOUTH 4 TIMES  DAILY   isosorbide mononitrate (IMDUR) 120 MG 24 hr tablet  TAKE 1 TABLET BY MOUTH DAILY   nitroGLYCERIN (NITROSTAT) 0.4 MG SL tablet Place 1 tablet (0.4 mg total) under the tongue every 5 (five) minutes as needed for chest pain.   potassium chloride (KLOR-CON) 10 MEQ tablet Take 1 tablet (10 mEq total) by mouth every other day.   ranolazine (RANEXA) 500 MG 12 hr tablet Take 1 tablet (500 mg total) by mouth 2 (two) times daily.   rosuvastatin (CRESTOR) 20 MG tablet Take 1 tablet (20 mg total) by mouth daily.     Allergies:   Ace inhibitors, Atorvastatin, Rosuvastatin, Plavix [clopidogrel bisulfate], Sulfa antibiotics, and Sulfonamide derivatives   Social History   Socioeconomic History   Marital status: Married    Spouse name: Not on file   Number of children: Not on file   Years of education: Not on file   Highest education level: Not on file  Occupational History   Occupation: retired    Comment: manual labor (truck driver, heating and air)  Tobacco Use   Smoking status: Never   Smokeless tobacco: Former    Types: Chew    Quit date: 2000  Vaping Use   Vaping Use: Never used  Substance and Sexual Activity   Alcohol use: Yes    Alcohol/week: 0.0 standard drinks of alcohol    Comment: 06/23/2017 "a few drinks/ month   Drug use: No   Sexual activity: Not Currently  Other Topics Concern   Not on file  Social History Narrative   Lives locally with his wife [name] - former [employer] ECG tech).  Does not routinely exercise.   Social Determinants of Health   Financial Resource Strain: Not on file  Food Insecurity: Not on file  Transportation Needs: Not on file  Physical Activity: Not on file  Stress: Not on file  Social Connections: Not on file     Family History: The patient's family history includes Cancer in his mother; Coronary artery disease in his father; Heart attack in his father; Hyperlipidemia in his son; Hypertension in his son.  ROS:   Please see the history of present illness.    EKGs/Labs/Other Studies Reviewed:    The  following studies were reviewed today:   EKG:     Cardiac Studies & Procedures   CARDIAC CATHETERIZATION  CARDIAC CATHETERIZATION 09/25/2022  Narrative   Mid LM to Dist LM lesion is 100% stenosed.   Ost LAD to Mid LAD lesion is 100% stenosed.   Ramus lesion is 100% stenosed.   Ost Cx to Prox Cx lesion is 100% stenosed.   Ost RCA to Prox RCA lesion is 100% stenosed.   Origin to Prox Graft lesion is 100% stenosed (SVG to Diagonal)   Origin lesion is 50% stenosed.   Mid  Graft lesion is 30% stenosed.   Dist Graft lesion is 40% stenosed.   Non-stenotic Dist RCA lesion.   LIMA to OM patent   RIMA to LAD patent   SVG to PDA patent  Severe native CAD with chronic occlusion of the left main and proximal RCA s/p 4V CABG with 3/4 patent grafts Patent LIMA graft to OM Patent RIMA graft to LAD Patent SVG to PDA with patent stents within the body of the graft Occluded SVG to Diagonal Normal right heart pressures Mild elevation LVEDP (19-23 mmHg)  Recommendations: Continue medical management of CAD.  Findings Coronary Findings Diagnostic  Dominance: Co-dominant  Left Main Mid LM to Dist LM lesion is 100% stenosed.  Left Anterior Descending There is moderate diffuse disease throughout the vessel. Ost LAD to Mid LAD lesion is 100% stenosed.  Ramus Intermedius Ramus lesion is 100% stenosed.  Left Circumflex There is moderate diffuse disease throughout the vessel. Ost Cx to Prox Cx lesion is 100% stenosed.  Right Coronary Artery Vessel is moderate in size. There is moderate diffuse disease throughout the vessel. Ost RCA to Prox RCA lesion is 100% stenosed. Non-stenotic Dist RCA lesion.  Right Posterior Descending Artery There is moderate disease in the vessel.  LIMA LIMA Graft To 2nd Mrg LIMA.  Graft To 1st Diag Origin to Prox Graft lesion is 100% stenosed.  RIMA RIMA Graft To Dist LAD RIMA.  Saphenous Graft To Dist RCA SVG. Origin lesion is 50% stenosed. The  lesion was previously treated . Mid Graft lesion is 30% stenosed. The lesion was previously treated . Dist Graft lesion is 40% stenosed.  Intervention  No interventions have been documented.   CARDIAC CATHETERIZATION  CARDIAC CATHETERIZATION 01/04/2021  Narrative  Ostial occlusion of the left main and native right coronary.  Occlusion of saphenous vein graft to the diagonal.  Patent right internal mammary artery graft to the mid LAD.  Patent left internal mammary artery graft to the circumflex marginal.  Patent saphenous vein graft to the distal RCA which could not be selectively engaged due to overhanging ostial stents.  The distal two thirds of the vessel appeared to be widely patent.  There could be high-grade obstruction at the distal anastomosis or in the native RCA.  Overall normal LV function with normal LVEDP.  EF greater than 50%.  RECOMMENDATIONS:   Patient with advanced atherosclerosis, failed saphenous vein grafts including inability to selectively engaged the saphenous vein graft to the right coronary due to overhanging ostial stent placed in 2018.  Given the current findings, he is really not a candidate for repeated angiography as intervention is not possible unless there is clear evidence of anterior MI in which case perhaps right radial approach could be used to attempt selective engagement of the [place] and perform salvage PCI on the LAD.  Same is true if there is concern for circumflex territory MI, perhaps the LIMA could be used as a conduit to treat native circumflex disease.  Findings Coronary Findings Diagnostic  Dominance: Co-dominant  Left Main Mid LM to Dist LM lesion is 100% stenosed.  Left Anterior Descending There is moderate diffuse disease throughout the vessel. Ost LAD to Mid LAD lesion is 100% stenosed.  Ramus Intermedius Ramus lesion is 100% stenosed.  Left Circumflex There is moderate diffuse disease throughout the vessel. Ost Cx to Prox  Cx lesion is 100% stenosed.  Right Coronary Artery Vessel is moderate in size. There is moderate diffuse disease throughout the vessel. Ost RCA to Prox  RCA lesion is 100% stenosed. Non-stenotic Dist RCA lesion.  Right Posterior Descending Artery There is moderate disease in the vessel.  LIMA LIMA Graft To 2nd Mrg LIMA.  Graft To 1st Diag Origin to Prox Graft lesion is 100% stenosed.  RIMA RIMA Graft To Dist LAD RIMA.  Saphenous Graft To Dist RCA SVG. Origin lesion is 50% stenosed. The lesion was previously treated. Mid Graft lesion is 30% stenosed. The lesion was previously treated. Dist Graft lesion is 40% stenosed.  Intervention  No interventions have been documented.   STRESS TESTS  MYOCARDIAL PERFUSION IMAGING 05/23/2019  Narrative  Nuclear stress EF: 57%.  Normal perfusion No ischemia or scar  This is a low risk study.   ECHOCARDIOGRAM  ECHOCARDIOGRAM COMPLETE 06/19/2022  Narrative ECHOCARDIOGRAM REPORT    Patient Name:   Joshua [name]. Date of Exam: 06/19/2022 Medical Rec #:  [id number]            Height:       68.0 in Accession #:    [id number]           Weight:       232.0 lb Date of Birth:  Jan 29, 1941            BSA:          2.177 m Patient Age:    44 years             BP:           126/62 mmHg Patient Gender: M                    HR:           58 bpm. Exam Location:  Outpatient  Procedure: 2D Echo, Color Doppler, Cardiac Doppler and Intracardiac Opacification Agent  Indications:    Shortness of breath R06.02  History:        Patient has prior history of Echocardiogram examinations, most recent 01/05/2021. CAD, Prior CABG; Risk Factors:Hypertension and Dyslipidemia.  Sonographer:    Mikki Santee RDCS Referring Phys: [place]   1. Left ventricular ejection fraction, by estimation, is 55 to 60%. The left ventricle has normal function. The left ventricle has no regional wall motion abnormalities. There is mild  concentric left ventricular hypertrophy. Left ventricular diastolic parameters are indeterminate. 2. Right ventricular systolic function is moderately reduced. The right ventricular size is moderately enlarged. There is mildly elevated pulmonary artery systolic pressure. 3. Right atrial size was moderately dilated. 4. The mitral valve is normal in structure. Mild mitral valve regurgitation. No evidence of mitral stenosis. 5. Tricuspid valve regurgitation is mild to moderate. 6. The aortic valve is tricuspid. There is mild calcification of the aortic valve. There is mild thickening of the aortic valve. Aortic valve regurgitation is not visualized. Aortic valve sclerosis/calcification is present, without any evidence of aortic stenosis. 7. The inferior vena cava is dilated in size with >50% respiratory variability, suggesting right atrial pressure of 8 mmHg.  Comparison(s): A prior study was performed on 01/05/2021. Tricuspid regurgitation is now mild to moderate.  FINDINGS Left Ventricle: Left ventricular ejection fraction, by estimation, is 55 to 60%. The left ventricle has normal function. The left ventricle has no regional wall motion abnormalities. Definity contrast agent was given IV to delineate the left ventricular endocardial borders. The left ventricular internal cavity size was normal in size. There is mild concentric left ventricular hypertrophy. Left ventricular diastolic parameters are indeterminate.  Right Ventricle:  The right ventricular size is moderately enlarged. No increase in right ventricular wall thickness. Right ventricular systolic function is moderately reduced. There is mildly elevated pulmonary artery systolic pressure. The tricuspid regurgitant velocity is 3.00 m/s, and with an assumed right atrial pressure of 8 mmHg, the estimated right ventricular systolic pressure is 123XX123 mmHg.  Left Atrium: Left atrial size was normal in size.  Right Atrium: Right atrial size was  moderately dilated.  Pericardium: There is no evidence of pericardial effusion.  Mitral Valve: The mitral valve is normal in structure. Mild mitral annular calcification. Mild mitral valve regurgitation. No evidence of mitral valve stenosis.  Tricuspid Valve: The tricuspid valve is normal in structure. Tricuspid valve regurgitation is mild to moderate. No evidence of tricuspid stenosis.  Aortic Valve: The aortic valve is tricuspid. There is mild calcification of the aortic valve. There is mild thickening of the aortic valve. Aortic valve regurgitation is not visualized. Aortic valve sclerosis/calcification is present, without any evidence of aortic stenosis.  Pulmonic Valve: The pulmonic valve was not well visualized. Pulmonic valve regurgitation is mild. No evidence of pulmonic stenosis.  Aorta: The aortic root is normal in size and structure.  Venous: The inferior vena cava is dilated in size with greater than 50% respiratory variability, suggesting right atrial pressure of 8 mmHg.  IAS/Shunts: No atrial level shunt detected by color flow Doppler.   LEFT VENTRICLE PLAX 2D LVIDd:         4.80 cm   Diastology LVIDs:         3.20 cm   LV e' medial:    9.00 cm/s LV PW:         1.00 cm   LV E/e' medial:  12.2 LV IVS:        1.00 cm   LV e' lateral:   5.43 cm/s LVOT diam:     2.00 cm   LV E/e' lateral: 20.3 LV SV:         71 LV SV Index:   32 LVOT Area:     3.14 cm   RIGHT VENTRICLE RV Basal diam:  4.90 cm RV Mid diam:    4.30 cm RV S prime:     7.78 cm/s TAPSE (M-mode): 1.4 cm  LEFT ATRIUM             Index        RIGHT ATRIUM           Index LA diam:        3.90 cm 1.79 cm/m   RA Area:     27.40 cm LA Vol (A2C):   56.0 ml 25.73 ml/m  RA Volume:   92.50 ml  42.49 ml/m LA Vol (A4C):   53.7 ml 24.67 ml/m LA Biplane Vol: 55.2 ml 25.36 ml/m AORTIC VALVE LVOT Vmax:   102.00 cm/s LVOT Vmean:  62.900 cm/s LVOT VTI:    0.225 m  AORTA Ao Root diam: 3.30 cm Ao Asc diam:   3.60 cm  MITRAL VALVE                TRICUSPID VALVE MV Area (PHT): 3.85 cm     TR Peak grad:   36.0 mmHg MV Decel Time: 197 msec     TR Vmax:        300.00 cm/s MV E velocity: 110.00 cm/s MV A velocity: 69.20 cm/s   SHUNTS MV E/A ratio:  1.59         Systemic VTI:  0.22  m Systemic Diam: 2.00 cm  Godfrey Pick Tobb DO Electronically signed by Berniece Salines DO Signature Date/Time: 06/19/2022/1:40:58 PM    Final              Recent Labs: 09/22/2022: BUN 16; Creatinine, Ser 0.94; Platelets 195 09/25/2022: Hemoglobin 12.9; Potassium 3.9; Sodium 138 12/25/2022: ALT 20  Recent Lipid Panel    Component Value Date/Time   CHOL 147 12/25/2022 0804   TRIG 122 12/25/2022 0804   HDL 34 (L) 12/25/2022 0804   CHOLHDL 4.3 12/25/2022 0804   CHOLHDL 6.0 01/05/2021 0214   VLDL 59 (H) 01/05/2021 0214   LDLCALC 91 12/25/2022 0804   LDLDIRECT 87 05/30/2020 1231   LDLDIRECT 84 05/14/2016 1234     Risk Assessment/Calculations:    STOP-Bang Score:  6       Physical Exam:    VS:  BP (!) 130/58   Pulse (!) 55   Ht 5' 6.5" (1.689 m)   Wt 236 lb (107 kg)   SpO2 97%   BMI 37.52 kg/m     Wt Readings from Last 3 Encounters:  01/28/23 236 lb (107 kg)  11/17/22 233 lb 0.2 oz (105.7 kg)  10/28/22 228 lb (103.4 kg)    GEN: Morbid ObesityNo acute distress HEENT: Normal CARDIAC: RRR, no rubs, gallops; systolic murmur RESPIRATORY:  Clear to auscultation without rales, wheezing or rhonchi  ABDOMEN: Soft, non-tender, non-distended MUSCULOSKELETAL:  +1 bilateral pitting edema; No deformity  SKIN: Warm and dry NEUROLOGIC:  Alert and oriented x 3 PSYCHIATRIC:  Normal affect   ASSESSMENT:    1. Pulmonary hypertension, unspecified ([place])   2. Essential hypertension   3. Coronary artery disease involving native coronary artery of native heart with angina pectoris ([place])   4. OSA (obstructive sleep apnea)   5. Morbid obesity ([place])    PLAN:    Pulmonary Hypertension Moderate TR - WHO Functional  Class III, Stage C, hypervolemic, WHO III suspected in the setting of OSA and morbid obesity - Diuretic regimen: on CTD and SGLT2i; we will add lasix 20 mg PO Q48 hrs and 10 meq BMP and BNP f/u - Fluid restriction of < 2 L  - has a 2 hrs flight Saturday, starting this regimen on Monday - Tri-Score 4 - discussed compression stockings - echo in one year unless worsening sx   Coronary Artery Disease; Obstructive/Nonobstructive - asymptomatic - anatomy: RIMA-LAD, LIMA-> LCX, SVG-Diag (100% stenosed), SVG RCA (2011 BMS with 2018 iDES) - continue ASA 81 mg - continue statin and zetia, goal LDL < 55 - continue BB (combo-pill) - continue nitrates  Six months with me or APP       Medication Adjustments/Labs and Tests Ordered: Current medicines are reviewed at length with the patient today.  Concerns regarding medicines are outlined above.  Orders Placed This Encounter  Procedures   Basic metabolic panel   Pro b natriuretic peptide (BNP)   Meds ordered this encounter  Medications   furosemide (LASIX) 20 MG tablet    Sig: Take 1 tablet (20 mg total) by mouth every other day.    Dispense:  45 tablet    Refill:  3   potassium chloride (KLOR-CON) 10 MEQ tablet    Sig: Take 1 tablet (10 mEq total) by mouth every other day.    Dispense:  45 tablet    Refill:  3    Patient Instructions  Medication Instructions:  Your physician has recommended you make the following change in your medication:  Lasix 20 mg every other day Potassium 10 meq every other day *If you need a refill on your cardiac medications before your next appointment, please call your pharmacy*   Lab Work: BMET, BNP 10-14 days If you have labs (blood work) drawn today and your tests are completely normal, you will receive your results only by: [place] (if you have MyChart) OR A paper copy in the mail If you have any lab test that is abnormal or we need to change your treatment, we will call you to review  the results.    Follow-Up: At [facility], you and your health needs are our priority.  As part of our continuing mission to provide you with exceptional heart care, we have created designated Provider Care Teams.  These Care Teams include your primary Cardiologist (physician) and Advanced Practice Providers (APPs -  Physician Assistants and Nurse Practitioners) who all work together to provide you with the care you need, when you need it.   Your next appointment:   6 month(s)  Provider:   Dr Gasper Sells or APP     Signed, Werner Lean, MD  01/28/2023 10:53 AM    [place]

## 2023-01-29 DIAGNOSIS — G4733 Obstructive sleep apnea (adult) (pediatric): Secondary | ICD-10-CM | POA: Diagnosis not present

## 2023-02-09 ENCOUNTER — Ambulatory Visit: Payer: No Typology Code available for payment source | Attending: Internal Medicine

## 2023-02-09 DIAGNOSIS — R0602 Shortness of breath: Secondary | ICD-10-CM | POA: Diagnosis not present

## 2023-02-09 DIAGNOSIS — I272 Pulmonary hypertension, unspecified: Secondary | ICD-10-CM

## 2023-02-10 LAB — BASIC METABOLIC PANEL
BUN/Creatinine Ratio: 15 (ref 10–24)
BUN: 17 mg/dL (ref 8–27)
CO2: 25 mmol/L (ref 20–29)
Calcium: 8.7 mg/dL (ref 8.6–10.2)
Chloride: 96 mmol/L (ref 96–106)
Creatinine, Ser: 1.16 mg/dL (ref 0.76–1.27)
Glucose: 109 mg/dL — ABNORMAL HIGH (ref 70–99)
Potassium: 3.7 mmol/L (ref 3.5–5.2)
Sodium: 136 mmol/L (ref 134–144)
eGFR: 64 mL/min/{1.73_m2} (ref >59)

## 2023-02-10 LAB — PRO B NATRIURETIC PEPTIDE: NT-Pro BNP: 71 pg/mL (ref 0–486)

## 2023-02-16 ENCOUNTER — Other Ambulatory Visit: Payer: Self-pay | Admitting: Family Medicine

## 2023-02-16 MED ORDER — ALPRAZOLAM 0.5 MG PO TABS: ORAL_TABLET | ORAL | 5 refills | Status: DC

## 2023-02-16 MED ORDER — GABAPENTIN 400 MG PO CAPS: ORAL_CAPSULE | ORAL | 3 refills | Status: DC

## 2023-03-01 DIAGNOSIS — G4733 Obstructive sleep apnea (adult) (pediatric): Secondary | ICD-10-CM | POA: Diagnosis not present

## 2023-03-06 ENCOUNTER — Telehealth: Payer: Self-pay | Admitting: Family Medicine

## 2023-03-06 NOTE — Telephone Encounter (Signed)
Contacted Joshua [name]. to schedule their annual wellness visit. Call back at later date: [phone]  Patient's wife has to be w/ patient for AWV and she's taking care of her granddaughter.  Thank you,  [facility] Support [facility] Direct dial  [phone]

## 2023-03-11 ENCOUNTER — Encounter: Payer: Self-pay | Admitting: Internal Medicine

## 2023-03-25 ENCOUNTER — Encounter: Payer: Self-pay | Admitting: Family Medicine

## 2023-03-25 ENCOUNTER — Ambulatory Visit (INDEPENDENT_AMBULATORY_CARE_PROVIDER_SITE_OTHER): Payer: No Typology Code available for payment source | Admitting: Family Medicine

## 2023-03-25 VITALS — BP 120/60 | HR 52 | Wt 231.2 lb

## 2023-03-25 DIAGNOSIS — R296 Repeated falls: Secondary | ICD-10-CM | POA: Diagnosis not present

## 2023-03-25 DIAGNOSIS — R251 Tremor, unspecified: Secondary | ICD-10-CM

## 2023-03-25 DIAGNOSIS — R2689 Other abnormalities of gait and mobility: Secondary | ICD-10-CM | POA: Diagnosis not present

## 2023-03-25 MED ORDER — CITALOPRAM HYDROBROMIDE 20 MG PO TABS: 20.0000 mg | ORAL_TABLET | Freq: Every day | ORAL | 3 refills | Status: DC

## 2023-03-25 MED ORDER — ATENOLOL-CHLORTHALIDONE 100-25 MG PO TABS: 1.0000 | ORAL_TABLET | Freq: Every day | ORAL | 3 refills | Status: DC

## 2023-03-25 MED ORDER — EZETIMIBE 10 MG PO TABS: 10.0000 mg | ORAL_TABLET | Freq: Every day | ORAL | 3 refills | Status: DC

## 2023-03-25 NOTE — Patient Instructions (Signed)
Let me see you in 6-8 weeks You should hear directly from Dr. Don Perking office for your referral Randie Heinz to see you!

## 2023-03-27 NOTE — Progress Notes (Signed)
    CHIEF COMPLAINT / HPI: #1.  Worsening balance issues.  Feels very unsteady on his feet for the last 6 to 12 months.  Seems to be worsening.  He also has a feeling of weakness in his knees and legs.  This is most notable when he tries to do small chores around the house such as taking out the garbage.  This has been gradually worsening over the last 6 to 8 months.  He just saw his cardiologist who did not think he needed any particular intervention from a cardiac standpoint. 2.  He is here with his wife.  He says she is telling him he is much more forgetful at times.  Will occasionally be found gazing off into space.  He is not sure he believes her but says he does seem to be much more forgetful, walking into a room and not remembering why he is there.  He reports not getting lost.  He does not report any issues with operating microwave or general household appliances.  He is not doing much driving and none alone.   PERTINENT  PMH / PSH: I have reviewed the patient's medications, allergies, past medical and surgical history, smoking status and updated in the EMR as appropriate.   OBJECTIVE:  BP 120/60   Pulse (!) 52   Wt 231 lb 3.2 oz (104.9 kg)   SpO2 98%   BMI 36.76 kg/m  Vital signs reviewed. GENERAL: Well-developed, well-nourished, no acute distress. CARDIOVASCULAR: Regular rate and rhythm no murmur gallop or rub LUNGS: Clear to auscultation bilaterally, no rales or wheeze. ABDOMEN: Soft positive bowel sounds NEURO/MSK:   He is a little unsteady standing with arms outstretched and eyes closed but no pronator drift.  His gait is wide-based, shuffling.Somewhat slowed movements  He can rise from a chair with no assistance but obviously a lot of effort on his part. Resting tremor of LUE/hand and tongue.    ASSESSMENT / PLAN: Symptoms of gait abnormality, postural instability, some tremor.  He previously saw Dr. Arbutus Leas years ago had neurology and I will refer him back to her for further  evaluation.  I did review his cardiology notes and it seems they do not think the balance issues or lightheadedness are associated with anything cardiac although they did note that he had bendopnea.  We discussed this and I told him to avoid bending over because I am afraid he is going to fall forward onto his head.  He should use reacher grabber devices to get stuff off the floor.  No problem-specific Assessment & Plan notes found for this encounter.   [name] MD

## 2023-03-31 DIAGNOSIS — G4733 Obstructive sleep apnea (adult) (pediatric): Secondary | ICD-10-CM | POA: Diagnosis not present

## 2023-04-02 ENCOUNTER — Telehealth: Payer: Self-pay | Admitting: Family Medicine

## 2023-04-02 NOTE — Telephone Encounter (Signed)
Contacted Joshua [name]. to schedule their annual wellness visit. Appointment made for 04/06/2023.  Thank you,  [facility] Support [facility] Direct dial  [phone]

## 2023-04-03 ENCOUNTER — Encounter: Payer: Self-pay | Admitting: Neurology

## 2023-04-05 NOTE — Patient Instructions (Signed)
Health Maintenance, Male Adopting a healthy lifestyle and getting preventive care are important in promoting health and wellness. Ask your health care provider about: The right schedule for you to have regular tests and exams. Things you can do on your own to prevent diseases and keep yourself healthy. What should I know about diet, weight, and exercise? Eat a healthy diet  Eat a diet that includes plenty of vegetables, fruits, low-fat dairy products, and lean protein. Do not eat a lot of foods that are high in solid fats, added sugars, or sodium. Maintain a healthy weight Body mass index (BMI) is a measurement that can be used to identify possible weight problems. It estimates body fat based on height and weight. Your health care provider can help determine your BMI and help you achieve or maintain a healthy weight. Get regular exercise Get regular exercise. This is one of the most important things you can do for your health. Most adults should: Exercise for at least 150 minutes each week. The exercise should increase your heart rate and make you sweat (moderate-intensity exercise). Do strengthening exercises at least twice a week. This is in addition to the moderate-intensity exercise. Spend less time sitting. Even light physical activity can be beneficial. Watch cholesterol and blood lipids Have your blood tested for lipids and cholesterol at 80 years of age, then have this test every 5 years. You may need to have your cholesterol levels checked more often if: Your lipid or cholesterol levels are high. You are older than 80 years of age. You are at high risk for heart disease. What should I know about cancer screening? Many types of cancers can be detected early and may often be prevented. Depending on your health history and family history, you may need to have cancer screening at various ages. This may include screening for: Colorectal cancer. Prostate cancer. Skin cancer. Lung  cancer. What should I know about heart disease, diabetes, and high blood pressure? Blood pressure and heart disease High blood pressure causes heart disease and increases the risk of stroke. This is more likely to develop in people who have high blood pressure readings or are overweight. Talk with your health care provider about your target blood pressure readings. Have your blood pressure checked: Every 3-5 years if you are 18-39 years of age. Every year if you are 40 years old or older. If you are between the ages of 65 and 75 and are a current or former smoker, ask your health care provider if you should have a one-time screening for abdominal aortic aneurysm (AAA). Diabetes Have regular diabetes screenings. This checks your fasting blood sugar level. Have the screening done: Once every three years after age 45 if you are at a normal weight and have a low risk for diabetes. More often and at a younger age if you are overweight or have a high risk for diabetes. What should I know about preventing infection? Hepatitis B If you have a higher risk for hepatitis B, you should be screened for this virus. Talk with your health care provider to find out if you are at risk for hepatitis B infection. Hepatitis C Blood testing is recommended for: Everyone born from 1945 through 1965. Anyone with known risk factors for hepatitis C. Sexually transmitted infections (STIs) You should be screened each year for STIs, including gonorrhea and chlamydia, if: You are sexually active and are younger than 80 years of age. You are older than 80 years of age and your   health care provider tells you that you are at risk for this type of infection. Your sexual activity has changed since you were last screened, and you are at increased risk for chlamydia or gonorrhea. Ask your health care provider if you are at risk. Ask your health care provider about whether you are at high risk for HIV. Your health care provider  may recommend a prescription medicine to help prevent HIV infection. If you choose to take medicine to prevent HIV, you should first get tested for HIV. You should then be tested every 3 months for as long as you are taking the medicine. Follow these instructions at home: Alcohol use Do not drink alcohol if your health care provider tells you not to drink. If you drink alcohol: Limit how much you have to 0-2 drinks a day. Know how much alcohol is in your drink. In the [place] one drink equals one 12 oz bottle of beer (355 mL), one 5 oz glass of wine (148 mL), or one 1 oz glass of hard liquor (44 mL). Lifestyle Do not use any products that contain nicotine or tobacco. These products include cigarettes, chewing tobacco, and vaping devices, such as e-cigarettes. If you need help quitting, ask your health care provider. Do not use street drugs. Do not share needles. Ask your health care provider for help if you need support or information about quitting drugs. General instructions Schedule regular health, dental, and eye exams. Stay current with your vaccines. Tell your health care provider if: You often feel depressed. You have ever been abused or do not feel safe at home. Summary Adopting a healthy lifestyle and getting preventive care are important in promoting health and wellness. Follow your health care provider's instructions about healthy diet, exercising, and getting tested or screened for diseases. Follow your health care provider's instructions on monitoring your cholesterol and blood pressure. This information is not intended to replace advice given to you by your health care provider. Make sure you discuss any questions you have with your health care provider. Document Revised: 04/08/2021 Document Reviewed: 04/08/2021 Elsevier Patient Education  2023 Elsevier Inc.  

## 2023-04-05 NOTE — Progress Notes (Unsigned)
I connected with  Joshua [name]. on 04/06/2023 by a audio enabled telemedicine application and verified that I am speaking with the correct person using two identifiers.  Patient Location: Home  Provider Location: Home Office  I discussed the limitations of evaluation and management by telemedicine. The patient expressed understanding and agreed to proceed.   Subjective:   Joshua [name]. is a 82 y.o. male who presents for an Initial Medicare Annual Wellness Visit.  Review of Systems    Per HPI unless specifically indicated below.  Cardiac Risk Factors include: advanced age (>21men, >63 women);male gender, Essential Hypertension, CAD, and Hyperlipidemia.           Objective:       04/06/2023    9:34 AM 03/25/2023   10:39 AM 03/25/2023   10:19 AM  Vitals with BMI  Weight   231 lbs 3 oz  Systolic 131 120 161  Diastolic 77 60 62  Pulse   52    Today's Vitals   04/06/23 0934  BP: 131/77  PainSc: 0-No pain   There is no height or weight on file to calculate BMI.     04/06/2023    9:40 AM 03/25/2023   10:20 AM 09/25/2022    9:16 AM 05/22/2022   10:06 AM 01/05/2021    5:00 AM 01/04/2021   11:35 AM 05/30/2020   10:24 AM  Advanced Directives  Does Patient Have a Medical Advance Directive? Yes No Yes No  No No  Type of Estate agent of [place];Living will        Does patient want to make changes to medical advance directive? No - Patient declined        Would patient like information on creating a medical advance directive?    No - Patient declined No - Patient declined  No - Patient declined    Current Medications (verified) Outpatient Encounter Medications as of 04/06/2023  Medication Sig   ALPRAZolam (XANAX) 0.5 MG tablet TAKE 1 TABLET BY MOUTH AT  BEDTIME AS NEEDED FOR RESTLESS  LEGS   aspirin EC 81 MG EC tablet Take 1 tablet (81 mg total) by mouth daily.   atenolol-chlorthalidone (TENORETIC) 100-25 MG tablet Take 1 tablet by mouth daily.    citalopram (CELEXA) 20 MG tablet Take 1 tablet (20 mg total) by mouth daily.   dapagliflozin propanediol (FARXIGA) 10 MG TABS tablet Take 1 tablet (10 mg total) by mouth daily before breakfast.   esomeprazole (NEXIUM) 20 MG capsule Take 1 capsule (20 mg total) by mouth daily.   ezetimibe (ZETIA) 10 MG tablet Take 1 tablet (10 mg total) by mouth daily.   furosemide (LASIX) 20 MG tablet Take 1 tablet (20 mg total) by mouth every other day.   gabapentin (NEURONTIN) 400 MG capsule Take one by mouth five times a day as directed   isosorbide mononitrate (IMDUR) 120 MG 24 hr tablet TAKE 1 TABLET BY MOUTH DAILY   nitroGLYCERIN (NITROSTAT) 0.4 MG SL tablet Place 1 tablet (0.4 mg total) under the tongue every 5 (five) minutes as needed for chest pain.   potassium chloride (KLOR-CON) 10 MEQ tablet Take 1 tablet (10 mEq total) by mouth every other day.   ranolazine (RANEXA) 500 MG 12 hr tablet Take 1 tablet (500 mg total) by mouth 2 (two) times daily.   rosuvastatin (CRESTOR) 20 MG tablet Take 1 tablet (20 mg total) by mouth daily.   No facility-administered encounter medications on file  as of 04/06/2023.    Allergies (verified) Ace inhibitors, Atorvastatin, Rosuvastatin, Plavix [clopidogrel bisulfate], Sulfa antibiotics, and Sulfonamide derivatives   History: Past Medical History:  Diagnosis Date   Coronary artery disease involving native coronary artery of native heart with angina pectoris (HCC)    a. 1990 s/p CABG x 4 (RIMA->LAD, LIMA->LCX, VG->Diag, VG->RCA); b. 2011 s/p BMS to VG->RCA.; c. Unstable Angina 05/2017: Patent RIMA-LAD & LIMA-LCx, ostSVG-RCA 65% ISR & mSVG-RCA  99% - DES PCI to both // Myoview 05/2019:  EF 57, no ischemia or scar, Low Risk    GERD (gastroesophageal reflux disease)    History of echocardiogram    a. 01/2017 Echo: EF 55-60%, no rwma, mild MR, midlly dil LA, PASP .   History of hiatal hernia    Hyperlipidemia    Hypertension    Obesity    Past Surgical History:   Procedure Laterality Date   CATARACT EXTRACTION W/ INTRAOCULAR LENS  IMPLANT, BILATERAL Bilateral    CORONARY ANGIOPLASTY WITH STENT PLACEMENT  2011   "RCA"   CORONARY ARTERY BYPASS GRAFT  1990   CABG X4   CORONARY STENT INTERVENTION N/A 06/24/2017   Procedure: Coronary Stent Intervention;  Surgeon: Lyn Records, MD;  Location: [facility] IN[facility]ASIVE [facility];  Service: Cardiovascular: Ostial 4.0 x 12 mm Onyx DES reducing 70% stenosis to less than 40%. Distal body 3.5 x 15 Onyx DES reducing 99% stenosis to less than 30%.   LEFT HEART CATH AND CORS/GRAFTS ANGIOGRAPHY N/A 06/24/2017   Procedure: Left Heart Cath and Cors/Grafts Angiography;  Surgeon: Lyn Records, MD;  Location: [facility] INVASIVE CV LAB;  Service: Cardiovascular:  CTO native RCA, LAD & LCx (Graft Dependent). Patent LIMA-LCx & RIMA-LAD.  SVG-RCA: ost 65% ISR & mid 99% --> DES PCI to both   LEFT HEART CATH AND CORS/GRAFTS ANGIOGRAPHY N/A 01/04/2021   Procedure: LEFT HEART CATH AND CORS/GRAFTS ANGIOGRAPHY;  Surgeon: Lyn Records, MD;  Location: [facility] INVASIVE CV LAB;  Service: Cardiovascular;  Laterality: N/A;   RIGHT/LEFT HEART CATH AND CORONARY/GRAFT ANGIOGRAPHY N/A 09/25/2022   Procedure: RIGHT/LEFT HEART CATH AND CORONARY/GRAFT ANGIOGRAPHY;  Surgeon: Kathleene Hazel, MD;  Location: [facility] INVASIVE CV [facility];  Service: Cardiovascular;  Laterality: N/A;   Family History  Problem Relation Age of Onset   Cancer Mother        ovarian   Hyperlipidemia Son    Hypertension Son    Heart attack Father    Coronary artery disease Father    Social History   Socioeconomic History   Marital status: Married    Spouse name: Joshua [name]   Number of children: 2   Years of education: Not on file   Highest education level: Not on file  Occupational History   Occupation: retired    Comment: manual labor (truck driver, heating and air)  Tobacco Use   Smoking status: Never   Smokeless tobacco: Former    Types: Chew    Quit date: 2000  Vaping Use   Vaping  Use: Never used  Substance and Sexual Activity   Alcohol use: Not Currently    Comment: 06/23/2017 "a few drinks/ month   Drug use: No   Sexual activity: Not Currently  Other Topics Concern   Not on file  Social History Narrative   Lives locally with his wife Joshua [name] - former [facility] ECG tech).  Does not routinely exercise.   Social Determinants of Health   Financial Resource Strain: Medium Risk (04/06/2023)   Overall Financial Resource  Strain (CARDIA)    Difficulty of Paying Living Expenses: Somewhat hard  Food Insecurity: No Food Insecurity (04/06/2023)   Hunger Vital Sign    Worried About Running Out of Food in the Last Year: Never true    Ran Out of Food in the Last Year: Never true  Transportation Needs: No Transportation Needs (04/06/2023)   PRAPARE - Administrator, Civil Service (Medical): No    Lack of Transportation (Non-Medical): No  Physical Activity: Inactive (04/06/2023)   Exercise Vital Sign    Days of Exercise per Week: 0 days    Minutes of Exercise per Session: 0 min  Stress: No Stress Concern Present (04/06/2023)   [employer] of Occupational Health - Occupational Stress Questionnaire    Feeling of Stress : Not at all  Social Connections: Socially Isolated (04/06/2023)   Social Connection and Isolation Panel [NHANES]    Frequency of Communication with Friends and Family: Once a week    Frequency of Social Gatherings with Friends and Family: Once a week    Attends Religious Services: Never    Database administrator or Organizations: No    Attends Engineer, structural: Never    Marital Status: Married    Tobacco Counseling Counseling given: No   Clinical Intake:  Pre-visit preparation completed: No  Pain : No/denies pain Pain Score: 0-No pain     Nutritional Status: BMI > 30  Obese Nutritional Risks: None Diabetes: No  How often do you need to have someone help you when you read instructions, pamphlets, or other written materials from  your doctor or pharmacy?: 1 - Never  Diabetic?No  Interpreter Needed?: No  Information entered by :: Laurel Dimmer, CMA   Activities of Daily Living    04/06/2023    9:34 AM  In your present state of health, do you have any difficulty performing the following activities:  Hearing? 0  Vision? 0  Difficulty concentrating or making decisions? 1  Walking or climbing stairs? 1  Dressing or bathing? 0  Doing errands, shopping? 0    Patient Care Team: Nestor Ramp, MD as PCP - General Katrinka Blazing Barry Dienes, MD (Inactive) as PCP - Cardiology (Cardiology)  Indicate any recent Medical Services you may have received from other than [facility] providers in the past year (date may be approximate).     Assessment:   This is a routine wellness examination for Joshua [name].  Hearing/Vision screen  Denies any hearing issues. Denies any change to her vision. Annual Eye Exam, [facility]   Dietary issues and exercise activities discussed: Current Exercise Habits: The patient does not participate in regular exercise at present, Exercise limited by: Other - see comments (impaired balance)   Goals Addressed   None    Depression Screen    04/06/2023    9:33 AM 03/25/2023   10:22 AM 08/21/2021    8:54 AM 12/10/2020    1:54 PM 05/30/2020   10:24 AM 04/20/2019    8:58 AM 06/30/2018    9:16 AM  PHQ 2/9 Scores  PHQ - 2 Score 0 0 0 0 0 0 0  PHQ- 9 Score  0 0 0       Fall Risk    04/06/2023    9:34 AM 03/25/2023   10:18 AM 04/20/2019    8:58 AM 06/30/2018    9:16 AM 12/29/2017    2:42 PM  Fall Risk   Falls in the past year? 1 1 0 No  No  Number falls in past yr: 1 1 0    Injury with Fall? 1 0     Risk for fall due to : Impaired balance/gait;Impaired mobility      Follow up Falls evaluation completed  Falls evaluation completed      FALL RISK PREVENTION PERTAINING TO THE HOME:  Any stairs in or around the home? Yes  If so, are there any without handrails? No  Home free of loose throw rugs in walkways, pet  beds, electrical cords, etc? Yes  Adequate lighting in your home to reduce risk of falls? Yes   ASSISTIVE DEVICES UTILIZED TO PREVENT FALLS:  Life alert? No  Use of a cane, walker or w/c? No  Grab bars in the bathroom? No  Shower chair or bench in shower? No  Elevated toilet seat or a handicapped toilet? No   TIMED UP AND GO:  Was the test performed? Unable to perform, virtual appointment   Cognitive Function:        04/06/2023    9:37 AM  6CIT Screen  What Year? 4 points  What month? 0 points  What time? 0 points  Count back from 20 0 points  Months in reverse 0 points  Repeat phrase 2 points  Total Score 6 points    Immunizations Immunization History  Administered Date(s) Administered   COVID-19, mRNA, vaccine(Comirnaty)12 years and older 11/26/2022   DTP 04/18/2009   Fluad Quad(high Dose 65+) 08/21/2021, 09/17/2022   Influenza Split 09/01/2012   Influenza Whole 09/05/2008   PFIZER(Purple Top)SARS-COV-2 Vaccination 02/11/2020, 03/06/2020, 10/19/2020   Pfizer Covid-19 Vaccine Bivalent Booster 42yrs & up 10/01/2021   Pneumococcal Conjugate-13 06/14/2014   Pneumococcal Polysaccharide-23 04/18/2009, 09/17/2011   Tdap 09/17/2011    TDAP status: Due, Education has been provided regarding the importance of this vaccine. Advised may receive this vaccine at local pharmacy or Health Dept. Aware to provide a copy of the vaccination record if obtained from local pharmacy or Health Dept. Verbalized acceptance and understanding.  Flu Vaccine status: Up to date  Pneumococcal vaccine status: Up to date  Covid-19 vaccine status: Information provided on how to obtain vaccines.   Qualifies for Shingles Vaccine? Yes   Zostavax completed No   Shingrix Completed?: No.    Education has been provided regarding the importance of this vaccine. Patient has been advised to call insurance company to determine out of pocket expense if they have not yet received this vaccine. Advised may also  receive vaccine at local pharmacy or Health Dept. Verbalized acceptance and understanding.  Screening Tests Health Maintenance  Topic Date Due   Diabetic kidney evaluation - Urine ACR  Never done   Hepatitis C Screening  Never done   Zoster Vaccines- Shingrix (1 of 2) Never done   DTaP/Tdap/Td (3 - Td or Tdap) 09/16/2021   COVID-19 Vaccine (6 - 2023-24 season) 01/21/2023   INFLUENZA VACCINE  07/02/2023   Diabetic kidney evaluation - eGFR measurement  02/09/2024   Medicare Annual Wellness (AWV)  04/05/2024   Pneumonia Vaccine 70+ Years old  Completed   HPV VACCINES  Aged Out   COLONOSCOPY (Pts 45-66yrs Insurance coverage will need to be confirmed)  Discontinued    Health Maintenance  Health Maintenance Due  Topic Date Due   Diabetic kidney evaluation - Urine ACR  Never done   Hepatitis C Screening  Never done   Zoster Vaccines- Shingrix (1 of 2) Never done   DTaP/Tdap/Td (3 - Td or Tdap) 09/16/2021  COVID-19 Vaccine (6 - 2023-24 season) 01/21/2023    Colorectal cancer screening: No longer required.   Lung Cancer Screening: (Low Dose CT Chest recommended if Age 29-80 years, 30 pack-year currently smoking OR have quit w/in 15years.) does not qualify.   Lung Cancer Screening Referral: not applicable   Additional Screening:  Hepatitis C Screening: does qualify  Vision Screening: Recommended annual ophthalmology exams for early detection of glaucoma and other disorders of the eye. Is the patient up to date with their annual eye exam? Yes Who is the provider or what is the name of the office in which the patient attends annual eye exams? [facility] If pt is not established with a provider, would they like to be referred to a provider to establish care? No .   Dental Screening: Recommended annual dental exams for proper oral hygiene  Community Resource Referral / Chronic Care Management: CRR required this visit?  No   CCM required this visit?  No      Plan:     I have  personally reviewed and noted the following in the patient's chart:   Medical and social history Use of alcohol, tobacco or illicit drugs  Current medications and supplements including opioid prescriptions. Patient is not currently taking opioid prescriptions. Functional ability and status Nutritional status Physical activity Advanced directives List of other physicians Hospitalizations, surgeries, and ER visits in previous 12 months Vitals Screenings to include cognitive, depression, and falls Referrals and appointments  In addition, I have reviewed and discussed with patient certain preventive protocols, quality metrics, and best practice recommendations. A written personalized care plan for preventive services as well as general preventive health recommendations were provided to patient.     Joshua [name] , Thank you for taking time to come for your Medicare Wellness Visit. I appreciate your ongoing commitment to your health goals. Please review the following plan we discussed and let me know if I can assist you in the future.   These are the goals we discussed:  Goals   None     This is a list of the screening recommended for you and due dates:  Health Maintenance  Topic Date Due   Yearly kidney health urinalysis for diabetes  Never done   Hepatitis C Screening: USPSTF Recommendation to screen - Ages 28-79 yo.  Never done   Zoster (Shingles) Vaccine (1 of 2) Never done   DTaP/Tdap/Td vaccine (3 - Td or Tdap) 09/16/2021   COVID-19 Vaccine (6 - 2023-24 season) 01/21/2023   Flu Shot  07/02/2023   Yearly kidney function blood test for diabetes  02/09/2024   Medicare Annual Wellness Visit  04/05/2024   Pneumonia Vaccine  Completed   HPV Vaccine  Aged Out   Colon Cancer Screening  Discontinued    Lonna Cobb, [facility]   04/06/2023  Nurse Notes: Approximately 30 minute Non-Face -To-Face Medicare Wellness Visit

## 2023-04-06 ENCOUNTER — Ambulatory Visit (INDEPENDENT_AMBULATORY_CARE_PROVIDER_SITE_OTHER): Payer: No Typology Code available for payment source

## 2023-04-06 VITALS — BP 131/77

## 2023-04-06 DIAGNOSIS — Z0000 Encounter for general adult medical examination without abnormal findings: Secondary | ICD-10-CM

## 2023-04-17 NOTE — Progress Notes (Unsigned)
Assessment/Plan:   1.  Gait instability  -He has some clinical evidence of peripheral neuropathy.  We are going to do lab workup in that regard as well as EMG.  -He does not meet clinical evidence today of Parkinson's disease.  He has lingual tremor, which has been there at least since 2017.  Lingual tremor can present with essential tremor, but he has just a little bit of evidence of that.  It is rarely associated with Parkinson's disease and certainly not the presenting sign (1 reported in the literature).  Nonetheless, we decided to go ahead and do a DaTscan just to make sure there is nothing developing here.  He will hold the citalopram 24 hours before doing DaTscan.   B12 deficiency  -pt with evidence of B12 deficiency when last checked in 2017.  We will need to recheck this.  Certainly, can contribute to PN if low enough  3.  I will follow-up with patient after the above has been completed.  I did warn him that we will likely consider physical therapy, increased exercise and using his ambulatory assistive device at all times.   Subjective:   Joshua [name]. was seen today in the movement disorders clinic for neurologic consultation at the request of Nestor Ramp, MD.  The consultation is for the evaluation of balance change and gait change.  Pt with wife/daughter who supplements hx.  Primary care records are reviewed.  He saw primary care April 24, who noted that patient had shuffling gait with resting tremor of the left upper extremity and tongue.  Cardiology notes are reviewed from February as well, for which he sees them for pulmonary hypertension and long-term chronic shortness of breath.  I saw the patient back in 2017 for tongue/lingual tremor and then saw him back in 2018 for postherpetic neuralgia.   Specific Symptoms:  Tremor: Yes.  , tongue tremor.  Wife states that tongue tremor is worse and actually "clicks."  Occasionally, wife states that L hand tremors.   Family  hx of similar:  No. Voice: no change Sleep: sleeps well with cpap  Vivid Dreams:  Yes.    Acting out dreams:  wife states that he kicks and jerks at night Postural symptoms:  Yes.   - pt states that some of walking issues are due to DOE.  "I get to where I look like I am drunk."    Falls?  Yes.   - last fall was in kitchen few weeks ago.  Wife states that he was bending over to pick something up and kept going.  Wife states that she has never seen him fall but he describes all of them the say - that he is bending over to get something and keeps going.  No cane/walker - refuses to use.  He initially denies feet/leg paresthesias, but his wife states that he actually complains about that all of the time. Bradykinesia symptoms: shuffling gait, slow movements, and difficulty getting out of a chair Loss of smell:  No. Loss of taste:  No. Urinary Incontinence:  No. Per pt but daughter states he does intermittenly Difficulty Swallowing:  No. Per pt but wife states he coughs a lot with eating and esp drinking.  Wife states that virtually every meal pt will say that water goes down the wrong pipe. Trouble with ADL's:  No.  Trouble buttoning clothing: No. Depression:  occasionally due to inability to do things Memory changes:  Yes.  , wife states  that "he has forgotten how to use the stove."  Pt states that he is still doing cooking (but they are doing a lot of frozen for dinner).  Wife does bills and always has.   Hallucinations:  No.  visual distortions: No. N/V:  No. Lightheaded:  No.  Syncope: No. Vision change:  no diplopia but has occasional floaters.  He did about 2 years ago have diplopia for a few months but it resolved.   Prior exposure to reglan/antipsychotics: No.  Neuroimaging of the brain has  previously been performed. He had an MRI brain done in 12/2020 and this demonstrated remote lacune vs. Betsy Coder robins space in the L BG.     ALLERGIES:   Allergies  Allergen Reactions   Ace  Inhibitors Cough   Atorvastatin Other (See Comments)    Muscle pain   Rosuvastatin Other (See Comments)    Muscle pain   Plavix [Clopidogrel Bisulfate] Itching and Rash   Sulfa Antibiotics Itching and Rash   Sulfonamide Derivatives Itching and Rash    CURRENT MEDICATIONS:  Current Meds  Medication Sig   ALPRAZolam (XANAX) 0.5 MG tablet TAKE 1 TABLET BY MOUTH AT  BEDTIME AS NEEDED FOR RESTLESS  LEGS   aspirin EC 81 MG EC tablet Take 1 tablet (81 mg total) by mouth daily.   atenolol-chlorthalidone (TENORETIC) 100-25 MG tablet Take 1 tablet by mouth daily.   citalopram (CELEXA) 20 MG tablet Take 1 tablet (20 mg total) by mouth daily.   dapagliflozin propanediol (FARXIGA) 10 MG TABS tablet Take 1 tablet (10 mg total) by mouth daily before breakfast.   esomeprazole (NEXIUM) 20 MG capsule Take 1 capsule (20 mg total) by mouth daily.   ezetimibe (ZETIA) 10 MG tablet Take 1 tablet (10 mg total) by mouth daily.   furosemide (LASIX) 20 MG tablet Take 1 tablet (20 mg total) by mouth every other day.   gabapentin (NEURONTIN) 400 MG capsule Take one by mouth five times a day as directed   isosorbide mononitrate (IMDUR) 120 MG 24 hr tablet TAKE 1 TABLET BY MOUTH DAILY   nitroGLYCERIN (NITROSTAT) 0.4 MG SL tablet Place 1 tablet (0.4 mg total) under the tongue every 5 (five) minutes as needed for chest pain.   potassium chloride (KLOR-CON) 10 MEQ tablet Take 1 tablet (10 mEq total) by mouth every other day.   ranolazine (RANEXA) 500 MG 12 hr tablet Take 1 tablet (500 mg total) by mouth 2 (two) times daily.   rosuvastatin (CRESTOR) 20 MG tablet Take 1 tablet (20 mg total) by mouth daily.     Objective:   VITALS:   Vitals:   04/20/23 1002  BP: (!) 144/79  Pulse: (!) 53  SpO2: 97%  Weight: 232 lb (105.2 kg)  Height: 5\' 8"  (1.727 m)    GEN:  The patient appears stated age and is in NAD. HEENT:  Normocephalic, atraumatic.  The mucous membranes are moist. The superficial temporal arteries are  without ropiness or tenderness.  There is tongue tremor.   CV:  brady.  regular Lungs:  CTAB Neck/HEME:  There are no carotid bruits bilaterally.  Neurological examination:  Orientation: The patient is alert and oriented x3.  Cranial nerves: There is good facial symmetry. Extraocular muscles are intact. The visual fields are full to confrontational testing. The speech is fluent and clear. Soft palate rises symmetrically and there is no tongue deviation. Hearing is intact to conversational tone. Sensation: Sensation is intact to light and pinprick throughout (facial, trunk,  extremities). Vibration is decreased distally. There is no extinction with double simultaneous stimulation. There is no sensory dermatomal level identified. Motor: Strength is 5/5 in the bilateral upper and lower extremities.   Shoulder shrug is equal and symmetric.  There is no pronator drift. Deep tendon reflexes: Deep tendon reflexes are 2-/4 at the bilateral biceps, triceps, brachioradialis, patella and achilles. Plantar responses are downgoing bilaterally.  Movement examination: Tone: There is nl tone in the bilateral upper extremities.  The tone in the lower extremities is nl.  Abnormal movements: there is constant tongue tremor.  Tongue within mouth Coordination:  There is no decremation with RAM's, with any form of RAMS, including alternating supination and pronation of the forearm, hand opening and closing, finger taps, heel taps and toe taps.  Gait and Station: The patient pushes off to arise.  He does not shuffle, but does have short stepped gait.  He does not have much armswing on either side. I have reviewed and interpreted the following labs independently   Chemistry      Component Value Date/Time   NA 136 02/09/2023 1100   K 3.7 02/09/2023 1100   CL 96 02/09/2023 1100   CO2 25 02/09/2023 1100   BUN 17 02/09/2023 1100   CREATININE 1.16 02/09/2023 1100   CREATININE 0.87 05/14/2016 1234      Component  Value Date/Time   CALCIUM 8.7 02/09/2023 1100   ALKPHOS 59 08/21/2021 0947   AST 28 08/21/2021 0947   ALT 20 12/25/2022 0804   BILITOT 0.4 08/21/2021 0947      Lab Results  Component Value Date   TSH 3.057 01/04/2021   Lab Results  Component Value Date   WBC 7.0 09/22/2022   HGB 12.9 (L) 09/25/2022   HCT 38.0 (L) 09/25/2022   MCV 82 09/22/2022   PLT 195 09/22/2022   Lab Results  Component Value Date   VITAMINB12 244 07/16/2016     Total time spent on today's visit was 45 minutes, including both face-to-face time and nonface-to-face time.  Time included that spent on review of records (prior notes available to me/labs/imaging if pertinent), discussing treatment and goals, answering patient's questions and coordinating care.  Cc:  Nestor Ramp, MD

## 2023-04-20 ENCOUNTER — Telehealth: Payer: Self-pay | Admitting: Family Medicine

## 2023-04-20 ENCOUNTER — Encounter: Payer: Self-pay | Admitting: Neurology

## 2023-04-20 ENCOUNTER — Ambulatory Visit (INDEPENDENT_AMBULATORY_CARE_PROVIDER_SITE_OTHER): Payer: No Typology Code available for payment source | Admitting: Neurology

## 2023-04-20 VITALS — BP 144/79 | HR 53 | Ht 68.0 in | Wt 232.0 lb

## 2023-04-20 DIAGNOSIS — Z5181 Encounter for therapeutic drug level monitoring: Secondary | ICD-10-CM

## 2023-04-20 DIAGNOSIS — G609 Hereditary and idiopathic neuropathy, unspecified: Secondary | ICD-10-CM

## 2023-04-20 DIAGNOSIS — E538 Deficiency of other specified B group vitamins: Secondary | ICD-10-CM

## 2023-04-20 DIAGNOSIS — R251 Tremor, unspecified: Secondary | ICD-10-CM

## 2023-04-20 DIAGNOSIS — R6889 Other general symptoms and signs: Secondary | ICD-10-CM | POA: Diagnosis not present

## 2023-04-20 NOTE — Patient Instructions (Addendum)
As we discussed, we are going to do a DaT scan.  We discussed that this is not a diagnostic scan, but will just give [place] some information on dopamine levels in the brain.  Here is some information which may be helpful to you. Your provider has requested that you have labwork completed today. The lab is located on the Second floor at [address], within the [facility] office. When you get off the elevator, turn right and go in the [facility] [address]; the first brown door on the left.  Tell the ladies behind the desk that you are there for lab work. If you are not called within 15 minutes please check with the front desk.   Once you complete your labs you are free to go. You will receive a call or message via MyChart with your lab results.      Before the Exam  Please tell the nurse, nuclear imaging technician or nuclear medicine physician if you are pregnant, nursing or have reduced liver function. Please also inform us if you have an allergy or sensitivity to iodine.  The test may be completed with those who are allergic to iodine, but may require pre-medication with other medications to help avoid reactions. If you need to cancel the examination, please give [place] at least 24 hours notice.  Before your scan, stop taking these medicines for the length of time shown: Name of Drug Stop Taking  Amoxapine 4 days before  Benztropine  Cogentin 3 days before  Bupropion (Aplenzin, Budeprion, Voxra, Wellbutrin, Zyban) 48 hours before  Buspirone 15 hours before  Citalopram 24 hours before  Cocaine 6 hours before  Escitalopram 24 hours before  Methamphetamine 24 hours before  Methylphenidate (Concerta, Metadate, Methylin, Ritalin) 20 hours before  Paroxetine 24 hours before  Selegilene 48 hours before  Sertraline 3 days before    On the Day of the Exam Drink plenty of fluids and go to the bathroom frequently (and for two days after your exam) Wear loose comfortable clothing,  since you will need to lie still for a period of time. Please bring a list of all medications that you are taking; name and dosage. We want to make your waiting time as pleasant as possible. Consider bringing your favorite magazine, book or music player to help you pass the time.  You do not need to stay at the imaging facility the entire time, between the initial injection and the scan itself.   Please leave your jewelry and valuables at home.  During the Exam The DaTscan once started takes approximately 30-45 minutes. However, following injection of the DaT agent approximately 3-6 hours are required before the agent has achieved appropriate concentration in the brain.  We will inject the DaTscan through an intravenous (IV) line into your arm in the AM, usually around 8-9am, and then you will come back usually in the mid afternoon for the scan. Before the exam, you will receive a drug to allow you to protect the thyroid. For the imaging test, you will be asked to lie on a table and an imaging technologist will position your head in a headrest. A strip of tape or a flexible restraint may be placed around your head to help you to not move your head during the scan. A camera will be positioned above you and you must remain very still for about 30 minute while images are taken. The scanner will be very close to your head, but will not touch  your head.

## 2023-04-21 DIAGNOSIS — R251 Tremor, unspecified: Secondary | ICD-10-CM | POA: Diagnosis not present

## 2023-04-21 DIAGNOSIS — Z5181 Encounter for therapeutic drug level monitoring: Secondary | ICD-10-CM | POA: Diagnosis not present

## 2023-04-21 DIAGNOSIS — R6889 Other general symptoms and signs: Secondary | ICD-10-CM | POA: Diagnosis not present

## 2023-05-01 DIAGNOSIS — G4733 Obstructive sleep apnea (adult) (pediatric): Secondary | ICD-10-CM | POA: Diagnosis not present

## 2023-05-06 ENCOUNTER — Encounter: Payer: Self-pay | Admitting: Family Medicine

## 2023-05-06 ENCOUNTER — Ambulatory Visit (INDEPENDENT_AMBULATORY_CARE_PROVIDER_SITE_OTHER): Payer: No Typology Code available for payment source | Admitting: Family Medicine

## 2023-05-06 VITALS — BP 110/62 | HR 54 | Ht 68.0 in | Wt 228.0 lb

## 2023-05-06 DIAGNOSIS — Z9189 Other specified personal risk factors, not elsewhere classified: Secondary | ICD-10-CM

## 2023-05-06 DIAGNOSIS — I25119 Atherosclerotic heart disease of native coronary artery with unspecified angina pectoris: Secondary | ICD-10-CM | POA: Diagnosis not present

## 2023-05-06 DIAGNOSIS — R251 Tremor, unspecified: Secondary | ICD-10-CM

## 2023-05-06 DIAGNOSIS — R0609 Other forms of dyspnea: Secondary | ICD-10-CM | POA: Insufficient documentation

## 2023-05-06 NOTE — Assessment & Plan Note (Signed)
Reviewed with him and his wife is heart disease.  He has some right heart failure with pulmonary hypertension.  We discussed at length things that he should and should not do, specifically high intensity activity such as pushing full garbage cans are probably not a great idea.  It is a wonderful idea to continue to be as active as possible with activities that are on level ground etc.  I understand his frustration.
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
## 2023-05-06 NOTE — Assessment & Plan Note (Signed)
Continues workup with neurologist, Dr. Arbutus Leas.  DAT study scheduled.  I will see him in 3 months

## 2023-05-06 NOTE — Assessment & Plan Note (Signed)
I think his symptoms are similar to when he was last seen.  Do not think there is anything significantly different.  Will continue his current medication regimen.  Should things change, he will let me or his cardiologist know.

## 2023-05-06 NOTE — Assessment & Plan Note (Signed)
I think a couple of issues are happening here.  I am worried he has peripheral neuropathy and we may need to send him for some type of occupational therapy evaluation to evaluate that while driving.  I think the bigger issue, after listening to him and his wife discussed, is that he is taking some driving risks that he does not typically do.  This may be related to his overall frustration with other things that he is not unable to do since he has pulmonary artery hypertension and dyspnea on exertion.  We discussed at length.  Will follow-up in 3 months.

## 2023-05-14 ENCOUNTER — Ambulatory Visit (INDEPENDENT_AMBULATORY_CARE_PROVIDER_SITE_OTHER): Payer: No Typology Code available for payment source | Admitting: Neurology

## 2023-05-14 DIAGNOSIS — G609 Hereditary and idiopathic neuropathy, unspecified: Secondary | ICD-10-CM | POA: Diagnosis not present

## 2023-05-14 NOTE — Procedures (Signed)
[facility]  [address] [place], [address]  [place], [place] [address] Tel: [phone] Fax: [phone] Test Date:  05/14/2023  Patient: Joshua [name] DOB: 20-Feb-1941 Physician: Nita Sickle, DO  Sex: Male Height: 5\' 8"  Ref Phys: Kerin Salen, DO  ID#: [id number]   Technician:    History: This is a 82 year old man referred for evaluation of gait unsteadiness.  NCV & EMG Findings: Extensive electrodiagnostic testing of the right lower extremity and additional studies of the left shows:  Bilateral sural and superficial peroneal sensory responses are absent. Bilateral peroneal and tibial motor responses are within normal limits. Bilateral tibial H reflex studies are within normal limits. There is no evidence of active or chronic motor axonal loss changes affecting any of the tested muscles.  Motor unit configuration and recruitment pattern is within normal limits.  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   ___________________________ Nita Sickle, DO    Nerve Conduction Studies   Stim Site NR Peak (ms) Norm Peak (ms) O-P Amp (V) Norm O-P Amp  Left Sup Peroneal Anti Sensory (Ant Lat Mall)  32 C  12 cm *NR  <4.6  >3  Right Sup Peroneal Anti Sensory (Ant Lat Mall)  32 C  12 cm *NR  <4.6  >3  Left Sural Anti Sensory (Lat Mall)  32 C  Calf *NR  <4.6  >3  Right Sural Anti Sensory (Lat Mall)  32 C  Calf *NR  <4.6  >3     Stim Site NR Onset (ms) Norm Onset (ms) O-P Amp (mV) Norm O-P Amp Site1 Site2 Delta-0 (ms) Dist (cm) Vel (m/s) Norm Vel (m/s)  Left Peroneal Motor (Ext Dig Brev)  32 C  Ankle    3.7 <6.0 2.6 >2.5 B Fib Ankle 8.3 36.0 43 >40  B Fib    12.0  2.4  Poplt B Fib 1.6 8.0 50 >40  Poplt    13.6  2.3         Right Peroneal Motor (Ext Dig Brev)  32 C  Ankle    3.8 <6.0 3.0 >2.5 B Fib Ankle 7.8 38.0 49 >40  B Fib    11.6  2.5  Poplt B Fib 1.6 8.0 50 >40   Poplt    13.2  2.4         Left Peroneal TA Motor (Tib Ant)  32 C  Fib Head    2.7 <4.5 4.0 >3 Poplit Fib Head 1.4 8.0 57 >40  Poplit    4.1 <5.7 3.8         Right Peroneal TA Motor (Tib Ant)  32 C  Fib Head    3.7 <4.5 3.9 >3 Poplit Fib Head 1.4 8.0 57 >40  Poplit    5.1 <5.7 3.9         Left Tibial Motor (Abd Hall Brev)  32 C  Ankle    3.7 <6.0 8.2 >4 Knee Ankle 9.2 41.0 45 >40  Knee    12.9  6.5         Right Tibial Motor (Abd Hall Brev)  32 C  Ankle    3.4 <6.0 8.2 >4 Knee Ankle 9.3 43.0 46 >40  Knee    12.7  6.1          Electromyography   Side Muscle Ins.Act Fibs Fasc Recrt Amp Dur Poly Activation Comment  Right AntTibialis Nml Nml Nml Nml Nml Nml Nml Nml N/A  Right Gastroc Nml Nml Nml Nml Nml Nml Nml Nml N/A  Right Flex Dig Long Nml Nml Nml Nml Nml Nml Nml Nml N/A  Right RectFemoris Nml Nml Nml Nml Nml Nml Nml Nml N/A  Right GluteusMed Nml Nml Nml Nml Nml Nml Nml Nml N/A  Left AntTibialis Nml Nml Nml Nml Nml Nml Nml Nml N/A  Left Gastroc Nml Nml Nml Nml Nml Nml Nml Nml N/A  Left Flex Dig Long Nml Nml Nml Nml Nml Nml Nml Nml N/A  Left RectFemoris Nml Nml Nml Nml Nml Nml Nml Nml N/A  Left GluteusMed Nml Nml Nml Nml Nml Nml Nml Nml N/A      Waveforms:

## 2023-05-15 ENCOUNTER — Ambulatory Visit (HOSPITAL_COMMUNITY)
Admission: RE | Admit: 2023-05-15 | Discharge: 2023-05-15 | Disposition: A | Discharge status: Home / Self Care | Payer: No Typology Code available for payment source | Source: Ambulatory Visit | Attending: Neurology | Admitting: Neurology

## 2023-05-15 DIAGNOSIS — R251 Tremor, unspecified: Secondary | ICD-10-CM

## 2023-05-15 DIAGNOSIS — R269 Unspecified abnormalities of gait and mobility: Secondary | ICD-10-CM | POA: Diagnosis not present

## 2023-05-15 MED ORDER — POTASSIUM IODIDE (ANTIDOTE) 130 MG PO TABS
130.0000 mg | ORAL_TABLET | Freq: Once | ORAL | Status: AC
  Administered 2023-05-15: 130 mg via ORAL

## 2023-05-15 MED ORDER — IOFLUPANE I 123 185 MBQ/2.5ML IV SOLN
4.6000 | Freq: Once | INTRAVENOUS | Status: AC | PRN
  Administered 2023-05-15: 4.6 via INTRAVENOUS
  Filled 2023-05-15: qty 5

## 2023-05-15 MED ORDER — POTASSIUM IODIDE (ANTIDOTE) 130 MG PO TABS
ORAL_TABLET | ORAL | Status: AC
  Filled 2023-05-15: qty 1

## 2023-05-15 NOTE — Progress Notes (Signed)
Let pt know that EMG did demonstrate PN and could be reason off balance.  Did he get the labs done we ordered?  Also, I would recommend doing balance PT.  If agreeable, send order.

## 2023-05-17 ENCOUNTER — Emergency Department (HOSPITAL_BASED_OUTPATIENT_CLINIC_OR_DEPARTMENT_OTHER): Payer: No Typology Code available for payment source

## 2023-05-17 ENCOUNTER — Emergency Department (HOSPITAL_BASED_OUTPATIENT_CLINIC_OR_DEPARTMENT_OTHER)
Admission: EM | Admit: 2023-05-17 | Discharge: 2023-05-17 | Disposition: A | Discharge status: Home / Self Care | Payer: No Typology Code available for payment source | Attending: Emergency Medicine | Admitting: Emergency Medicine

## 2023-05-17 ENCOUNTER — Other Ambulatory Visit: Payer: Self-pay

## 2023-05-17 ENCOUNTER — Encounter (HOSPITAL_BASED_OUTPATIENT_CLINIC_OR_DEPARTMENT_OTHER): Payer: Self-pay | Admitting: Emergency Medicine

## 2023-05-17 DIAGNOSIS — I700 Atherosclerosis of aorta: Secondary | ICD-10-CM | POA: Diagnosis not present

## 2023-05-17 DIAGNOSIS — W01198A Fall on same level from slipping, tripping and stumbling with subsequent striking against other object, initial encounter: Secondary | ICD-10-CM | POA: Insufficient documentation

## 2023-05-17 DIAGNOSIS — K5730 Diverticulosis of large intestine without perforation or abscess without bleeding: Secondary | ICD-10-CM | POA: Diagnosis not present

## 2023-05-17 DIAGNOSIS — N400 Enlarged prostate without lower urinary tract symptoms: Secondary | ICD-10-CM | POA: Diagnosis not present

## 2023-05-17 DIAGNOSIS — S0101XA Laceration without foreign body of scalp, initial encounter: Secondary | ICD-10-CM | POA: Diagnosis not present

## 2023-05-17 DIAGNOSIS — Y92031 Bathroom in apartment as the place of occurrence of the external cause: Secondary | ICD-10-CM | POA: Insufficient documentation

## 2023-05-17 DIAGNOSIS — W19XXXA Unspecified fall, initial encounter: Secondary | ICD-10-CM

## 2023-05-17 DIAGNOSIS — S301XXA Contusion of abdominal wall, initial encounter: Secondary | ICD-10-CM

## 2023-05-17 DIAGNOSIS — Z7982 Long term (current) use of aspirin: Secondary | ICD-10-CM | POA: Insufficient documentation

## 2023-05-17 DIAGNOSIS — I2510 Atherosclerotic heart disease of native coronary artery without angina pectoris: Secondary | ICD-10-CM | POA: Insufficient documentation

## 2023-05-17 DIAGNOSIS — M542 Cervicalgia: Secondary | ICD-10-CM | POA: Diagnosis not present

## 2023-05-17 DIAGNOSIS — R519 Headache, unspecified: Secondary | ICD-10-CM | POA: Diagnosis not present

## 2023-05-17 DIAGNOSIS — Z043 Encounter for examination and observation following other accident: Secondary | ICD-10-CM | POA: Diagnosis not present

## 2023-05-17 DIAGNOSIS — N281 Cyst of kidney, acquired: Secondary | ICD-10-CM | POA: Diagnosis not present

## 2023-05-17 DIAGNOSIS — M79601 Pain in right arm: Secondary | ICD-10-CM | POA: Diagnosis not present

## 2023-05-17 LAB — COMPREHENSIVE METABOLIC PANEL
ALT: 16 U/L (ref 0–44)
AST: 31 U/L (ref 15–41)
Albumin: 3.4 g/dL — ABNORMAL LOW (ref 3.5–5.0)
Alkaline Phosphatase: 41 U/L (ref 38–126)
Anion gap: 9 (ref 5–15)
BUN: 18 mg/dL (ref 8–23)
CO2: 28 mmol/L (ref 22–32)
Calcium: 8.6 mg/dL — ABNORMAL LOW (ref 8.9–10.3)
Chloride: 99 mmol/L (ref 98–111)
Creatinine, Ser: 1.06 mg/dL (ref 0.61–1.24)
GFR, Estimated: >60 mL/min (ref >60)
Glucose, Bld: 114 mg/dL — ABNORMAL HIGH (ref 70–99)
Potassium: 3.9 mmol/L (ref 3.5–5.1)
Sodium: 136 mmol/L (ref 135–145)
Total Bilirubin: 0.4 mg/dL (ref 0.3–1.2)
Total Protein: 6.5 g/dL (ref 6.5–8.1)

## 2023-05-17 LAB — CBC WITH DIFFERENTIAL/PLATELET
Abs Immature Granulocytes: 0.05 10*3/uL (ref 0.00–0.07)
Basophils Absolute: 0 10*3/uL (ref 0.0–0.1)
Basophils Relative: 0 %
Eosinophils Absolute: 0.2 10*3/uL (ref 0.0–0.5)
Eosinophils Relative: 3 %
HCT: 38.4 % — ABNORMAL LOW (ref 39.0–52.0)
Hemoglobin: 12.3 g/dL — ABNORMAL LOW (ref 13.0–17.0)
Immature Granulocytes: 1 %
Lymphocytes Relative: 20 %
Lymphs Abs: 1.5 10*3/uL (ref 0.7–4.0)
MCH: 26.9 pg (ref 26.0–34.0)
MCHC: 32 g/dL (ref 30.0–36.0)
MCV: 83.8 fL (ref 80.0–100.0)
Monocytes Absolute: 0.7 10*3/uL (ref 0.1–1.0)
Monocytes Relative: 9 %
Neutro Abs: 5.2 10*3/uL (ref 1.7–7.7)
Neutrophils Relative %: 67 %
Platelets: 175 10*3/uL (ref 150–400)
RBC: 4.58 MIL/uL (ref 4.22–5.81)
RDW: 15.1 % (ref 11.5–15.5)
WBC: 7.7 10*3/uL (ref 4.0–10.5)
nRBC: 0 % (ref 0.0–0.2)

## 2023-05-17 MED ORDER — HYDROCODONE-ACETAMINOPHEN 5-325 MG PO TABS
1.0000 | ORAL_TABLET | Freq: Once | ORAL | Status: AC
  Administered 2023-05-17: 1 via ORAL
  Filled 2023-05-17: qty 1

## 2023-05-17 MED ORDER — LIDOCAINE-EPINEPHRINE-TETRACAINE (LET) TOPICAL GEL
3.0000 mL | Freq: Once | TOPICAL | Status: AC
  Administered 2023-05-17: 3 mL via TOPICAL
  Filled 2023-05-17: qty 3

## 2023-05-17 MED ORDER — IOHEXOL 300 MG/ML  SOLN
100.0000 mL | Freq: Once | INTRAMUSCULAR | Status: AC | PRN
  Administered 2023-05-17: 100 mL via INTRAVENOUS

## 2023-05-17 NOTE — ED Triage Notes (Signed)
Mechanical fall at home with head injury. Pt denies LOC. States he tripped. Wife reports he has large bruise on his back. Pt A&O, answering questions without difficulty. Bandage applied PTA, no bleed through at this time. Unsure of tetanus status. Denies focal neuro sx since fall.

## 2023-05-17 NOTE — ED Notes (Signed)
Patient transported to CT 

## 2023-05-17 NOTE — ED Provider Notes (Signed)
EMERGENCY DEPARTMENT AT [facility] Provider Note   CSN: [id number] Arrival date & time: 05/17/23  6644     History  Chief Complaint  Patient presents with   Fall    Joshua [name]. is a 82 y.o. male.  With PMH of peripheral neuropathy, balance disorder, CAD on ASA presenting after a mechanical fall with head injury. Tdap last in 2022.  Patient was going to the bathroom when he stood up tripped and fell backwards hitting the right side of his head and left flank on the bathroom sink.  He denies any preceding symptoms leading to the fall, no chest pain, no shortness of breath, no lightheadedness, no dizziness, no palpitations.  He did not lose consciousness and was able to stand up after the fall.  He has had no nausea, no vomiting, no confusion, no weakness of the arms or legs.  He has recurrent falls due to peripheral neuropathy and balance disorder.  Family is here with him who notes he is acting appropriately.  He is also complaining of some pain and abrasions around his left flank and right humeral region.  HPI     Home Medications Prior to Admission medications   Medication Sig Start Date End Date Taking? Authorizing Provider  ALPRAZolam Prudy Feeler) 0.5 MG tablet TAKE 1 TABLET BY MOUTH AT  BEDTIME AS NEEDED FOR RESTLESS  LEGS 02/16/23   Nestor Ramp, MD  aspirin EC 81 MG EC tablet Take 1 tablet (81 mg total) by mouth daily. 06/26/17   Arty Baumgartner, NP  atenolol-chlorthalidone (TENORETIC) 100-25 MG tablet Take 1 tablet by mouth daily. 03/25/23   Nestor Ramp, MD  citalopram (CELEXA) 20 MG tablet Take 1 tablet (20 mg total) by mouth daily. 03/25/23   Nestor Ramp, MD  dapagliflozin propanediol (FARXIGA) 10 MG TABS tablet Take 1 tablet (10 mg total) by mouth daily before breakfast. 06/20/22   Lyn Records, MD  esomeprazole (NEXIUM) 20 MG capsule Take 1 capsule (20 mg total) by mouth daily. 12/07/18   Nestor Ramp, MD  ezetimibe (ZETIA) 10 MG tablet Take 1  tablet (10 mg total) by mouth daily. 03/25/23   Nestor Ramp, MD  furosemide (LASIX) 20 MG tablet Take 1 tablet (20 mg total) by mouth every other day. 01/28/23   Christell Constant, MD  gabapentin (NEURONTIN) 400 MG capsule Take one by mouth five times a day as directed 02/16/23   Nestor Ramp, MD  isosorbide mononitrate (IMDUR) 120 MG 24 hr tablet TAKE 1 TABLET BY MOUTH DAILY 08/26/22   Lyn Records, MD  nitroGLYCERIN (NITROSTAT) 0.4 MG SL tablet Place 1 tablet (0.4 mg total) under the tongue every 5 (five) minutes as needed for chest pain. 06/04/22   Lyn Records, MD  potassium chloride (KLOR-CON) 10 MEQ tablet Take 1 tablet (10 mEq total) by mouth every other day. 01/28/23   Christell Constant, MD  ranolazine (RANEXA) 500 MG 12 hr tablet Take 1 tablet (500 mg total) by mouth 2 (two) times daily. 10/15/22   Carlos Levering, NP  rosuvastatin (CRESTOR) 20 MG tablet Take 1 tablet (20 mg total) by mouth daily. 09/24/22   Nahser, Deloris Ping, MD      Allergies    Ace inhibitors, Atorvastatin, Rosuvastatin, Plavix [clopidogrel bisulfate], Sulfa antibiotics, and Sulfonamide derivatives    Review of Systems   Review of Systems  Physical Exam Updated Vital Signs BP (!) 145/66   Pulse Marland Kitchen)  58   Temp 97.6 F (36.4 C) (Oral)   Resp 18   Ht 5\' 8"  (1.727 m)   Wt 102.1 kg   SpO2 97%   BMI 34.21 kg/m  Physical Exam Constitutional: Alert and oriented. Well appearing and in no distress. Eyes: Conjunctivae are normal. ENT      Head: Normocephalic and 7 cm T-shaped hemostatic laceration overlying the right parietal scalp.      Nose: No hemorrhage or deformity.      Neck/spine: No midline tenderness to palpation, step-off or deformity. Cardiovascular: S1, S2, bradycardic, regular rhythm, warm and well-perfused Respiratory: Normal respiratory effort. Breath sounds are normal. Gastrointestinal: Soft and nontender.  Large left flank contusion with overlying abrasion Musculoskeletal: Normal  range of motion in all extremities.  Right proximal humeral abrasions and tenderness      Right lower leg: No tenderness or edema.      Left lower leg: No tenderness or edema. Neurologic: Normal speech and language.  No facial droop.  PERRL.  5 out of 5 strength bilateral upper and lower extremities.  Sensation grossly intact.  No gross focal neurologic deficits are appreciated. Skin: Skin is warm, dry and intact. No rash noted. Psychiatric: Mood and affect are normal. Speech and behavior are normal.  ED Results / Procedures / Treatments   Labs (all labs ordered are listed, but only abnormal results are displayed) Labs Reviewed  COMPREHENSIVE METABOLIC PANEL - Abnormal; Notable for the following components:      Result Value   Glucose, Bld 114 (*)    Calcium 8.6 (*)    Albumin 3.4 (*)    All other components within normal limits  CBC WITH DIFFERENTIAL/PLATELET - Abnormal; Notable for the following components:   Hemoglobin 12.3 (*)    HCT 38.4 (*)    All other components within normal limits    EKG None  Radiology CT CHEST ABDOMEN PELVIS W CONTRAST  Result Date: 05/17/2023 CLINICAL DATA:  82 year old male with history of large bruise on the back from a fall. EXAM: CT CHEST, ABDOMEN, AND PELVIS WITH CONTRAST CT THORACIC SPINE WITHOUT CONTRAST TECHNIQUE: Multidetector CT imaging of the chest, abdomen and pelvis was performed following the standard protocol during bolus administration of intravenous contrast. Dedicated multiplanar reconstructions were then generated through the thoracic spine for interpretation. RADIATION DOSE REDUCTION: This exam was performed according to the departmental dose-optimization program which includes automated exposure control, adjustment of the mA and/or kV according to patient size and/or use of iterative reconstruction technique. CONTRAST:  OMNIPAQUE IOHEXOL 300 MG/ML  SOLN COMPARISON:  No priors. FINDINGS: CT CHEST FINDINGS Cardiovascular: No abnormal  high attenuation fluid within the mediastinum to suggest posttraumatic mediastinal hematoma. No evidence of posttraumatic aortic dissection/transection. Heart size is normal. There is no significant pericardial fluid, thickening or pericardial calcification. There is aortic atherosclerosis, as well as atherosclerosis of the great vessels of the mediastinum and the coronary arteries, including calcified atherosclerotic plaque in the left main, left anterior descending, left circumflex and right coronary arteries. Status post median sternotomy for CABG. Calcifications of the aortic valve. Mediastinum/Nodes: Mildly enlarged right paratracheal lymph node (axial image 10 of series 2) measuring 1.6 cm in short axis. Prominent low right paratracheal lymph node also noted measuring 1 cm in short axis. No definite hilar lymphadenopathy. Esophagus is unremarkable in appearance. No axillary lymphadenopathy. Lungs/Pleura: No pneumothorax. No acute consolidative airspace disease. No pleural effusions. Small pulmonary nodules are noted in the lungs bilaterally, largest of which measures only  6 x 4 mm (mean diameter 5 mm) in the left upper lobe (axial image 43 of series 4). No other larger more suspicious appearing pulmonary nodules or masses are noted. No acute consolidative airspace disease. No pleural effusions. Musculoskeletal: Median sternotomy wires. No acute displaced fractures or aggressive appearing lytic or blastic lesions are noted in the visualized portions of the skeleton. Additionally, dedicated reconstructions in the thoracic spine demonstrate no acute displaced or compression type fractures of the thoracic spine. Alignment is anatomic. CT ABDOMEN PELVIS FINDINGS Hepatobiliary: No evidence of significant acute traumatic injury to the liver. Liver has a slightly shrunken appearance and nodular contour, suggesting mild cirrhosis. No discrete cystic or solid hepatic lesions are noted. No intra or extrahepatic biliary  ductal dilatation. Noncalcified gallstone measuring 5 mm lying dependently in the gallbladder. Gallbladder is not distended. Gallbladder wall thickness is normal. No pericholecystic fluid or surrounding inflammatory changes. Pancreas: No evidence of significant acute traumatic injury to the pancreas. No pancreatic mass. No pancreatic ductal dilatation. No pancreatic or peripancreatic fluid collections or inflammatory changes. Spleen: No evidence of significant acute traumatic injury to the spleen. Unremarkable in appearance. Adrenals/Urinary Tract: No evidence of significant acute traumatic injury to either kidney or adrenal gland. Low-attenuation lesions in both kidneys, compatible with simple cysts, largest of which is exophytic extending off the medial aspect of the upper pole of the right kidney measuring 2.1 cm in diameter. No aggressive appearing renal lesions. No hydroureteronephrosis. Urinary bladder appears intact. Small diverticulum in the anterior aspect of the urinary bladder incidentally noted. Urinary bladder is otherwise unremarkable in appearance. Bilateral adrenal glands are normal in appearance. Stomach/Bowel: No definite evidence to suggest significant acute traumatic injury to the hollow viscera. The appearance of the stomach is normal. No pathologic dilatation of small bowel or colon. Normal appendix. Vascular/Lymphatic: No evidence to suggest significant acute traumatic injury to the abdominal aorta or major arteries/veins of the abdomen and pelvis. Atherosclerosis in the abdominal aorta and pelvic vasculature, without evidence of aneurysm or dissection. No lymphadenopathy noted in the abdomen or pelvis. Reproductive: Median lobe hypertrophy in the prostate gland. Prostate gland and seminal vesicles are otherwise unremarkable in appearance. Other: No high attenuation fluid within the peritoneal cavity or retroperitoneum to suggest significant posttraumatic hemorrhage. No significant volume of  ascites. No pneumoperitoneum. Musculoskeletal: No acute displaced fractures or aggressive appearing lytic or blastic lesions are noted in the visualized portions of the skeleton. IMPRESSION: 1. No evidence of significant acute traumatic injury to the chest, abdomen or pelvis. 2. Specifically, no evidence of significant acute traumatic injury to the thoracic spine. 3. Mildly enlarged right paratracheal lymph node. This is a nonspecific isolated finding of uncertain etiology and significance. Follow-up noncontrast chest CT is recommended in 6 months to ensure the stability or regression of this finding. At the time of the follow-up CT examination, attention to small pulmonary nodules is also recommended. 4. Aortic atherosclerosis, in addition to left main and three-vessel coronary artery disease. Status post median sternotomy for CABG. 5. There are calcifications of the aortic valve. Echocardiographic correlation for evaluation of potential valvular dysfunction may be warranted if clinically indicated. 6. Median lobe hypertrophy in the prostate gland. 7. Additional incidental findings, as above. Electronically Signed   By: Trudie Reed M.D.   On: 05/17/2023 05:41   CT T-SPINE NO CHARGE  Result Date: 05/17/2023 CLINICAL DATA:  82 year old male with history of large bruise on the back from a fall. EXAM: CT CHEST, ABDOMEN, AND PELVIS WITH CONTRAST  CT THORACIC SPINE WITHOUT CONTRAST TECHNIQUE: Multidetector CT imaging of the chest, abdomen and pelvis was performed following the standard protocol during bolus administration of intravenous contrast. Dedicated multiplanar reconstructions were then generated through the thoracic spine for interpretation. RADIATION DOSE REDUCTION: This exam was performed according to the departmental dose-optimization program which includes automated exposure control, adjustment of the mA and/or kV according to patient size and/or use of iterative reconstruction technique. CONTRAST:   OMNIPAQUE IOHEXOL 300 MG/ML  SOLN COMPARISON:  No priors. FINDINGS: CT CHEST FINDINGS Cardiovascular: No abnormal high attenuation fluid within the mediastinum to suggest posttraumatic mediastinal hematoma. No evidence of posttraumatic aortic dissection/transection. Heart size is normal. There is no significant pericardial fluid, thickening or pericardial calcification. There is aortic atherosclerosis, as well as atherosclerosis of the great vessels of the mediastinum and the coronary arteries, including calcified atherosclerotic plaque in the left main, left anterior descending, left circumflex and right coronary arteries. Status post median sternotomy for CABG. Calcifications of the aortic valve. Mediastinum/Nodes: Mildly enlarged right paratracheal lymph node (axial image 10 of series 2) measuring 1.6 cm in short axis. Prominent low right paratracheal lymph node also noted measuring 1 cm in short axis. No definite hilar lymphadenopathy. Esophagus is unremarkable in appearance. No axillary lymphadenopathy. Lungs/Pleura: No pneumothorax. No acute consolidative airspace disease. No pleural effusions. Small pulmonary nodules are noted in the lungs bilaterally, largest of which measures only 6 x 4 mm (mean diameter 5 mm) in the left upper lobe (axial image 43 of series 4). No other larger more suspicious appearing pulmonary nodules or masses are noted. No acute consolidative airspace disease. No pleural effusions. Musculoskeletal: Median sternotomy wires. No acute displaced fractures or aggressive appearing lytic or blastic lesions are noted in the visualized portions of the skeleton. Additionally, dedicated reconstructions in the thoracic spine demonstrate no acute displaced or compression type fractures of the thoracic spine. Alignment is anatomic. CT ABDOMEN PELVIS FINDINGS Hepatobiliary: No evidence of significant acute traumatic injury to the liver. Liver has a slightly shrunken appearance and nodular  contour, suggesting mild cirrhosis. No discrete cystic or solid hepatic lesions are noted. No intra or extrahepatic biliary ductal dilatation. Noncalcified gallstone measuring 5 mm lying dependently in the gallbladder. Gallbladder is not distended. Gallbladder wall thickness is normal. No pericholecystic fluid or surrounding inflammatory changes. Pancreas: No evidence of significant acute traumatic injury to the pancreas. No pancreatic mass. No pancreatic ductal dilatation. No pancreatic or peripancreatic fluid collections or inflammatory changes. Spleen: No evidence of significant acute traumatic injury to the spleen. Unremarkable in appearance. Adrenals/Urinary Tract: No evidence of significant acute traumatic injury to either kidney or adrenal gland. Low-attenuation lesions in both kidneys, compatible with simple cysts, largest of which is exophytic extending off the medial aspect of the upper pole of the right kidney measuring 2.1 cm in diameter. No aggressive appearing renal lesions. No hydroureteronephrosis. Urinary bladder appears intact. Small diverticulum in the anterior aspect of the urinary bladder incidentally noted. Urinary bladder is otherwise unremarkable in appearance. Bilateral adrenal glands are normal in appearance. Stomach/Bowel: No definite evidence to suggest significant acute traumatic injury to the hollow viscera. The appearance of the stomach is normal. No pathologic dilatation of small bowel or colon. Normal appendix. Vascular/Lymphatic: No evidence to suggest significant acute traumatic injury to the abdominal aorta or major arteries/veins of the abdomen and pelvis. Atherosclerosis in the abdominal aorta and pelvic vasculature, without evidence of aneurysm or dissection. No lymphadenopathy noted in the abdomen or pelvis. Reproductive: Median lobe hypertrophy  in the prostate gland. Prostate gland and seminal vesicles are otherwise unremarkable in appearance. Other: No high attenuation fluid  within the peritoneal cavity or retroperitoneum to suggest significant posttraumatic hemorrhage. No significant volume of ascites. No pneumoperitoneum. Musculoskeletal: No acute displaced fractures or aggressive appearing lytic or blastic lesions are noted in the visualized portions of the skeleton. IMPRESSION: 1. No evidence of significant acute traumatic injury to the chest, abdomen or pelvis. 2. Specifically, no evidence of significant acute traumatic injury to the thoracic spine. 3. Mildly enlarged right paratracheal lymph node. This is a nonspecific isolated finding of uncertain etiology and significance. Follow-up noncontrast chest CT is recommended in 6 months to ensure the stability or regression of this finding. At the time of the follow-up CT examination, attention to small pulmonary nodules is also recommended. 4. Aortic atherosclerosis, in addition to left main and three-vessel coronary artery disease. Status post median sternotomy for CABG. 5. There are calcifications of the aortic valve. Echocardiographic correlation for evaluation of potential valvular dysfunction may be warranted if clinically indicated. 6. Median lobe hypertrophy in the prostate gland. 7. Additional incidental findings, as above. Electronically Signed   By: Trudie Reed M.D.   On: 05/17/2023 05:41   DG Humerus Right  Result Date: 05/17/2023 CLINICAL DATA:  82 year old male with history of trauma from a fall. Right arm pain. EXAM: RIGHT HUMERUS - 2+ VIEW COMPARISON:  No priors. FINDINGS: Two views of the right humerus demonstrate no acute displaced fracture of the right humerus. Soft tissues are unremarkable. IMPRESSION: Negative. Electronically Signed   By: Trudie Reed M.D.   On: 05/17/2023 05:30   CT Head Wo Contrast  Result Date: 05/17/2023 CLINICAL DATA:  Recent fall with headaches and neck pain, initial encounter EXAM: CT HEAD WITHOUT CONTRAST CT CERVICAL SPINE WITHOUT CONTRAST TECHNIQUE: Multidetector CT  imaging of the head and cervical spine was performed following the standard protocol without intravenous contrast. Multiplanar CT image reconstructions of the cervical spine were also generated. RADIATION DOSE REDUCTION: This exam was performed according to the departmental dose-optimization program which includes automated exposure control, adjustment of the mA and/or kV according to patient size and/or use of iterative reconstruction technique. COMPARISON:  None Available. FINDINGS: CT HEAD FINDINGS Brain: No evidence of acute infarction, hemorrhage, hydrocephalus, extra-axial collection or mass lesion/mass effect. Mild atrophic changes are noted. Vascular: No hyperdense vessel or unexpected calcification. Skull: Normal. Negative for fracture or focal lesion. Sinuses/Orbits: No acute finding. Other: None. CT CERVICAL SPINE FINDINGS Alignment: Within normal limits. Skull base and vertebrae: 7 cervical segments are well visualized. Vertebral body height is well maintained. Multilevel osteophytic change and facet hypertrophic changes are noted. No acute fracture or acute facet abnormality is noted. Soft tissues and spinal canal: Surrounding soft tissue structures are within normal limits. Upper chest: Visualized lung apices are unremarkable. Other: None IMPRESSION: CT of the head: No acute intracranial abnormality noted. Atrophic changes are seen. CT of the cervical spine: Multilevel degenerative change without acute abnormality. Electronically Signed   By: Alcide Clever M.D.   On: 05/17/2023 02:40   CT Cervical Spine Wo Contrast  Result Date: 05/17/2023 CLINICAL DATA:  Recent fall with headaches and neck pain, initial encounter EXAM: CT HEAD WITHOUT CONTRAST CT CERVICAL SPINE WITHOUT CONTRAST TECHNIQUE: Multidetector CT imaging of the head and cervical spine was performed following the standard protocol without intravenous contrast. Multiplanar CT image reconstructions of the cervical spine were also generated.  RADIATION DOSE REDUCTION: This exam was performed according to the  departmental dose-optimization program which includes automated exposure control, adjustment of the mA and/or kV according to patient size and/or use of iterative reconstruction technique. COMPARISON:  None Available. FINDINGS: CT HEAD FINDINGS Brain: No evidence of acute infarction, hemorrhage, hydrocephalus, extra-axial collection or mass lesion/mass effect. Mild atrophic changes are noted. Vascular: No hyperdense vessel or unexpected calcification. Skull: Normal. Negative for fracture or focal lesion. Sinuses/Orbits: No acute finding. Other: None. CT CERVICAL SPINE FINDINGS Alignment: Within normal limits. Skull base and vertebrae: 7 cervical segments are well visualized. Vertebral body height is well maintained. Multilevel osteophytic change and facet hypertrophic changes are noted. No acute fracture or acute facet abnormality is noted. Soft tissues and spinal canal: Surrounding soft tissue structures are within normal limits. Upper chest: Visualized lung apices are unremarkable. Other: None IMPRESSION: CT of the head: No acute intracranial abnormality noted. Atrophic changes are seen. CT of the cervical spine: Multilevel degenerative change without acute abnormality. Electronically Signed   By: Alcide Clever M.D.   On: 05/17/2023 02:40   NM BRAIN DATSCAN TUMOR LOC INFLAM SPECT 1 DAY  Result Date: 05/15/2023 CLINICAL DATA:  82 year old male with gait instability. EXAM: NUCLEAR MEDICINE BRAIN IMAGING WITH SPECT  (DaTscan ) TECHNIQUE: SPECT images of the brain were obtained after intravenous injection of radiopharmaceutical. 4 hour post injection imaging. Appropriate positioning. 130 mg IO STAT given orally for thyroid blockade. RADIOPHARMACEUTICALS:  4.6 millicuries I 123 Ioflupane COMPARISON:  None Available. FINDINGS: Symmetric intense uptake within LEFT and RIGHT striata. The heads of the caudate nuclei and the posterior striata (putamen)  are normal shape. No evidence of loss of dopamine transport populations in the basal ganglia. IMPRESSION: Ioflupane scan within normal limits. No reduced radiotracer activity in basal ganglia to suggest Parkinson's syndrome pathology. Of note, DaTSCAN is not diagnostic of Parkinsonian syndromes, which remains a clinical diagnosis. DaTscan is an adjuvant test to aid in the clinical diagnosis of Parkinsonian syndromes. Electronically Signed   By: Genevive Bi M.D.   On: 05/15/2023 15:43    Procedures .Marland KitchenLaceration Repair  Date/Time: 05/17/2023 3:54 AM  Performed by: Mardene Sayer, MD Authorized by: Mardene Sayer, MD   Consent:    Consent obtained:  Verbal   Consent given by:  Patient   Risks discussed:  Infection, pain, poor cosmetic result, need for additional repair and poor wound healing   Alternatives discussed:  No treatment Universal protocol:    Patient identity confirmed:  Verbally with patient Anesthesia:    Anesthesia method:  Topical application   Topical anesthetic:  LET Laceration details:    Location:  Scalp   Scalp location:  R parietal   Length (cm):  7 Pre-procedure details:    Preparation:  Patient was prepped and draped in usual sterile fashion Exploration:    Hemostasis achieved with:  LET and direct pressure   Imaging outcome: foreign body not noted     Contaminated: no   Treatment:    Area cleansed with:  Saline and Shur-Clens   Amount of cleaning:  Standard Skin repair:    Repair method:  Staples   Number of staples:  9 Approximation:    Approximation:  Close Repair type:    Repair type:  Simple Post-procedure details:    Dressing:  Open (no dressing)   Procedure completion:  Tolerated well, no immediate complications     Medications Ordered in ED Medications  lidocaine-EPINEPHrine-tetracaine (LET) topical gel (3 mLs Topical Given 05/17/23 0245)  HYDROcodone-acetaminophen (NORCO/VICODIN) 5-325 MG per tablet 1  tablet (1 tablet Oral  Given 05/17/23 0347)  iohexol (OMNIPAQUE) 300 MG/ML solution 100 mL (100 mLs Intravenous Contrast Given 05/17/23 6213)    ED Course/ Medical Decision Making/ A&P                             Medical Decision Making Wake T Daymein Gelfand. is a 82 y.o. male.  With PMH of peripheral neuropathy, balance disorder, CAD on ASA presenting after a mechanical fall with head injury. Tdap last in 2022.  Mechanical fall, no LOC, not on anticoagulation.  Patient neurologically intact on exam.  CT head and C-spine obtained which are personally reviewed no evidence of ICH or acute traumatic injury.  Scalp Laceration repaired with 9 staples as detailed in procedure note.  Followed with the labs and imaging of the chest abdomen pelvis due to significant left flank contusion and abrasions.  Labs reviewed by me generally unremarkable, mild anemia hemoglobin 12.3 consistent with baseline.  Creatinine 1.06 within normal limits.  Glucose 114, no acute electrolyte abnormalities.  CT chest abdomen and pelvis with contrast as well as T-spine showed no acute traumatic injuries.  Incidental findings discussed with patient and family.  Discussed wound care and return for staple removal.  Return precaution discussed.  Patient and patient's family in agreement with plan and discharged in stable condition.  Amount and/or Complexity of Data Reviewed Labs: ordered. Radiology: ordered.  Risk Prescription drug management.      Final Clinical Impression(s) / ED Diagnoses Final diagnoses:  Fall, initial encounter  Laceration of scalp, initial encounter  Contusion of flank, initial encounter    Rx / DC Orders ED Discharge Orders     None         Mardene Sayer, MD 05/17/23 [phone]

## 2023-05-17 NOTE — Discharge Instructions (Addendum)
You have been seen in the Emergency Department (ED) today following a fall.  Your workup today did not reveal any injuries that require you to stay in the hospital. You can expect to be stiff and sore for the next several days.  Please take Tylenol or Motrin as needed for pain, but only as written on the box.  Please follow up with your primary care doctor as soon as possible regarding today's ED visit and your recent accident. See below- there was a lymph node in your chest that would require a follow-up chest CT scan in the next 6 months which can be ordered and managed by your primary care doctor.  3. Mildly enlarged right paratracheal lymph node. This is a  nonspecific isolated finding of uncertain etiology and significance.  Follow-up noncontrast chest CT is recommended in 6 months to ensure  the stability or regression of this finding. At the time of the  follow-up CT examination, attention to small pulmonary nodules is  also recommended.     Call your doctor or return to the ED if you develop a sudden or severe headache, confusion, slurred speech, facial droop, weakness or numbness in any arm or leg,  extreme fatigue, vomiting more than two times, severe abdominal pain, difficulty breathing or any other concerning signs or symptoms.   Regarding your laceration or cut your scalp, keep your cut clean with soap and water daily.  Have your sutures removed in:   Scalp = 7 days   Please follow up with your doctor as needed regarding today's emergent visit. Take Tylenol and/or Ibuprofen as needed for pain.  Return to the ED or call your doctor if you notice any signs of infection such as fever >100.52F, increased pain, increased redness, pus, or other symptoms that concern you.   Scarring precautions:  Always keep your cut, scrape or other skin injury clean. Gently wash the area with mild soap and water to keep out germs and remove debris.  To help the injured skin heal, use petroleum  jelly to keep the wound moist. Petroleum jelly prevents the wound from drying out and forming a scab; wounds with scabs take longer to heal. This will also help prevent a scar from getting too large, deep or itchy. As long as the wound is cleaned daily, it is not necessary to use anti-bacterial ointments.  After cleaning the wound and applying petroleum jelly or a similar ointment, cover the skin with an adhesive bandage. For large scrapes, sores, burns or persistent redness, it may be helpful to use hydrogel or silicone gel sheets.  Change your bandage daily to keep the wound clean while it heals. If you have skin that is sensitive to adhesives, try a non-adhesive gauze pad with paper tape. If using silicone gel or hydrogel sheets, follow the instructions on the package for changing the sheets.  If your injury requires stitches, follow your doctor's advice on how to care for the wound and when to get the stitches removed. This may help minimize the appearance of a scar.  Apply sunscreen to the wound after it has healed. Sun protection may help reduce red or brown discoloration and help the scar fade faster. Always use a broad-spectrum sunscreen with an SPF of 30 or higher and reapply frequently.

## 2023-05-19 ENCOUNTER — Encounter: Payer: Self-pay | Admitting: Family Medicine

## 2023-05-20 ENCOUNTER — Other Ambulatory Visit: Payer: Self-pay

## 2023-05-20 ENCOUNTER — Telehealth: Payer: Self-pay | Admitting: Neurology

## 2023-05-20 DIAGNOSIS — G609 Hereditary and idiopathic neuropathy, unspecified: Secondary | ICD-10-CM

## 2023-05-20 NOTE — Telephone Encounter (Signed)
Let pt know I just got labs back and his b12 was just tad low at 312.  I would like to see it over 400.  We recommend that he take over the counter B12, 1000 micrograms daily.

## 2023-05-20 NOTE — Telephone Encounter (Signed)
Called patients wife and was able to leave message about patients B-12

## 2023-05-25 ENCOUNTER — Ambulatory Visit (INDEPENDENT_AMBULATORY_CARE_PROVIDER_SITE_OTHER): Payer: No Typology Code available for payment source | Admitting: Family Medicine

## 2023-05-25 VITALS — BP 145/73 | HR 55 | Wt 233.2 lb

## 2023-05-25 DIAGNOSIS — I10 Essential (primary) hypertension: Secondary | ICD-10-CM

## 2023-05-25 DIAGNOSIS — S0101XD Laceration without foreign body of scalp, subsequent encounter: Secondary | ICD-10-CM

## 2023-05-25 NOTE — Progress Notes (Signed)
    SUBJECTIVE:   CHIEF COMPLAINT / HPI:   Scalp Laceration Occurred on 6/16 after a fall. Patient lost balance/tripped and fell in the bathroom. Struck right side of head on sink. Was seen in the ED afterwards. Had CT head which was unremarkable. Also had CT chest, abdomen, pelvis, and CT C-spine and T-spine without acute traumatic injuries. Laceration was repaired with 9 staples.  Here today for follow-up and staple removal. Reports his ribs are generally sore but otherwise he's doing well without complaints.  Has had multiple falls over past year- follows closely with Dr Jennette Kettle for this and has also seen neuro. Will be starting PT at neurorehab on July 15th.   PERTINENT  PMH / PSH: CAD, HTN  OBJECTIVE:   BP (!) 145/73 (BP Location: Right Arm, Patient Position: Sitting, Cuff Size: Normal)   Pulse (!) 55   Wt 233 lb 3.2 oz (105.8 kg)   BMI 35.46 kg/m   General: NAD, pleasant, able to participate in exam Respiratory: No respiratory distress Skin: warm and dry, no rashes noted Psych: Normal affect and mood Neuro: grossly intact Head: well-healed J shaped laceration on R parietal scalp with staples in place, no surrounding erythema, no drainage   ASSESSMENT/PLAN:   R Scalp Laceration Sustained after fall 8 days ago. Had thorough evaluation in the ED at the time of the incident, workup including CT head/neck, CT chest, abdomen, pelvis, and T spine were all unremarkable. Well-healed on exam, staples were removed today.    Maury Dus, MD [facility]

## 2023-05-26 ENCOUNTER — Ambulatory Visit: Payer: No Typology Code available for payment source | Admitting: Neurology

## 2023-05-26 NOTE — Assessment & Plan Note (Signed)
Systolic BP mildly elevated today. F/u with PCP and cardiologist.

## 2023-05-29 ENCOUNTER — Other Ambulatory Visit: Payer: Self-pay

## 2023-05-29 MED ORDER — ISOSORBIDE MONONITRATE ER 120 MG PO TB24: 120.0000 mg | ORAL_TABLET | Freq: Every day | ORAL | 2 refills | Status: DC

## 2023-05-29 MED ORDER — DAPAGLIFLOZIN PROPANEDIOL 10 MG PO TABS: 10.0000 mg | ORAL_TABLET | Freq: Every day | ORAL | 2 refills | Status: DC

## 2023-05-31 DIAGNOSIS — G4733 Obstructive sleep apnea (adult) (pediatric): Secondary | ICD-10-CM | POA: Diagnosis not present

## 2023-06-02 ENCOUNTER — Other Ambulatory Visit (HOSPITAL_COMMUNITY): Payer: No Typology Code available for payment source

## 2023-06-02 ENCOUNTER — Other Ambulatory Visit: Payer: Self-pay | Admitting: Family Medicine

## 2023-06-02 ENCOUNTER — Inpatient Hospital Stay (HOSPITAL_COMMUNITY): Admission: RE | Admit: 2023-06-02 | Payer: No Typology Code available for payment source | Source: Ambulatory Visit

## 2023-06-02 NOTE — Progress Notes (Signed)
I received refill request via FAX from [facility] [employer] for [place]. The number on their FAX does not match any of his pharmacies. Can you touch base with him or his wife Joshua [name]) and find out where he wants it sent? THANKS! [name]

## 2023-06-05 ENCOUNTER — Other Ambulatory Visit: Payer: Self-pay | Admitting: Family Medicine

## 2023-06-05 MED ORDER — FARXIGA 10 MG PO TABS: 10.0000 mg | ORAL_TABLET | Freq: Every day | ORAL | 3 refills | Status: DC

## 2023-06-05 NOTE — Addendum Note (Signed)
Addended byDenny Levy L on: 06/05/2023 05:13 PM   Modules accepted: Orders

## 2023-06-15 ENCOUNTER — Ambulatory Visit: Payer: No Typology Code available for payment source | Attending: Neurology | Admitting: Physical Therapy

## 2023-06-15 VITALS — BP 144/74 | HR 57

## 2023-06-15 DIAGNOSIS — R2681 Unsteadiness on feet: Secondary | ICD-10-CM | POA: Insufficient documentation

## 2023-06-15 DIAGNOSIS — M6281 Muscle weakness (generalized): Secondary | ICD-10-CM | POA: Diagnosis not present

## 2023-06-15 DIAGNOSIS — R296 Repeated falls: Secondary | ICD-10-CM | POA: Diagnosis not present

## 2023-06-15 DIAGNOSIS — G609 Hereditary and idiopathic neuropathy, unspecified: Secondary | ICD-10-CM | POA: Insufficient documentation

## 2023-06-15 DIAGNOSIS — R2689 Other abnormalities of gait and mobility: Secondary | ICD-10-CM | POA: Diagnosis not present

## 2023-06-15 NOTE — Therapy (Signed)
OUTPATIENT PHYSICAL THERAPY NEURO EVALUATION   Patient Name: Joshua [name]. MRN: [id number] DOB:[birth year], 82 y.o., male Today's Date: 06/15/2023   PCP: Nestor Ramp, MD REFERRING PROVIDER: Vladimir Faster, DO  END OF SESSION:  PT End of Session - 06/15/23 1021     Visit Number 1    Number of Visits 17   Plus eval   Date for PT Re-Evaluation 09/07/23   Due to potential delay in scheduling   Authorization Type [employer] [place]    PT Start Time 1018    PT Stop Time 1100    PT Time Calculation (min) 42 min    Equipment Utilized During Treatment Gait belt    Activity Tolerance Patient tolerated treatment well    Behavior During Therapy WFL for tasks assessed/performed             Past Medical History:  Diagnosis Date   Coronary artery disease involving native coronary artery of native heart with angina pectoris (HCC)    a. 1990 s/p CABG x 4 (RIMA->LAD, LIMA->LCX, VG->Diag, VG->RCA); b. 2011 s/p BMS to VG->RCA.; c. Unstable Angina 05/2017: Patent RIMA-LAD & LIMA-LCx, ostSVG-RCA 65% ISR & mSVG-RCA  99% - DES PCI to both // Myoview 05/2019:  EF 57, no ischemia or scar, Low Risk    GERD (gastroesophageal reflux disease)    History of echocardiogram    a. 01/2017 Echo: EF 55-60%, no rwma, mild MR, midlly dil LA, PASP .   History of hiatal hernia    Hyperlipidemia    Hypertension    Obesity    Past Surgical History:  Procedure Laterality Date   CATARACT EXTRACTION W/ INTRAOCULAR LENS  IMPLANT, BILATERAL Bilateral    CORONARY ANGIOPLASTY WITH STENT PLACEMENT  2011   "RCA"   CORONARY ARTERY BYPASS GRAFT  1990   CABG X4   CORONARY STENT INTERVENTION N/A 06/24/2017   Procedure: Coronary Stent Intervention;  Surgeon: Lyn Records, MD;  Location: [facility] INVASIVE CV LAB;  Service: Cardiovascular: Ostial 4.0 x 12 mm Onyx DES reducing 70% stenosis to less than 40%. Distal body 3.5 x 15 Onyx DES reducing 99% stenosis to less than 30%.   LEFT HEART CATH AND CORS/GRAFTS  ANGIOGRAPHY N/A 06/24/2017   Procedure: Left Heart Cath and Cors/Grafts Angiography;  Surgeon: Lyn Records, MD;  Location: [facility] INVASIVE CV LAB;  Service: Cardiovascular:  CTO native RCA, LAD & LCx (Graft Dependent). Patent LIMA-LCx & RIMA-LAD.  SVG-RCA: ost 65% ISR & mid 99% --> DES PCI to both   LEFT HEART CATH AND CORS/GRAFTS ANGIOGRAPHY N/A 01/04/2021   Procedure: LEFT HEART CATH AND CORS/GRAFTS ANGIOGRAPHY;  Surgeon: Lyn Records, MD;  Location: [facility] INVASIVE CV LAB;  Service: Cardiovascular;  Laterality: N/A;   RIGHT/LEFT HEART CATH AND CORONARY/GRAFT ANGIOGRAPHY N/A 09/25/2022   Procedure: RIGHT/LEFT HEART CATH AND CORONARY/GRAFT ANGIOGRAPHY;  Surgeon: Kathleene Hazel, MD;  Location: [facility] INVASIVE CV LAB;  Service: Cardiovascular;  Laterality: N/A;   Patient Active Problem List   Diagnosis Date Noted   Dyspnea on exertion 05/06/2023   Driving safety issue 62/95/2841   Pulmonary hypertension, unspecified (HCC) 01/28/2023   OSA (obstructive sleep apnea) 01/28/2023   Balance problems 01/31/2022   Dysphagia 01/31/2022   Healthcare maintenance 08/22/2021   History of non-ST elevation myocardial infarction (NSTEMI) 06/25/2017   Presence of drug coated stent in SVG-RCA 06/25/2017   Postherpetic neuralgia 01/02/2017   Tremor 05/15/2016   Irritability and anger 05/15/2016   Seborrheic keratoses 06/16/2014  Special screening for malignant neoplasm of prostate 06/14/2014   Retinal hemorrhage 07/23/2010   Impotence of organic origin 01/16/2010   Essential hypertension 04/18/2009   Hyperlipidemia with target low density lipoprotein (LDL) cholesterol less than 70 mg/dL 40/98/1191   CAD (coronary artery disease) 01/28/2007   HERNIA, HIATAL, NONCONGENITAL 01/28/2007    ONSET DATE: 05/20/2023 (referral)  REFERRING DIAG: G60.9 (ICD-10-CM) - Idiopathic peripheral neuropathy  THERAPY DIAG:  Unsteadiness on feet  Other abnormalities of gait and mobility  Repeated falls  Rationale for  Evaluation and Treatment: Rehabilitation  SUBJECTIVE:                                                                                                                                                                                             SUBJECTIVE STATEMENT: "Skip"  Pt presents without AD. Reports he has "lost his balance" and has to be mindful of where he is stepping. States he is furniture walking in the house. Most recent fall occurred after propping his foot on the toilet seat and having his foot slip into the bowl, fell into the sink and busted his head open. Reports his balance problems has been progressing over the past year, has dramatically worsened over the past 5-6 months. Has a significant cardiac history, gets short of breath very easily and has very poor endurance. Is sedentary at home. Has significant difficulty walking outside in yard and playing w/grandkids. Does not want to use a cane or walker.   Pt accompanied by:  Wife, Joshua [name]  PERTINENT HISTORY: Open heart surgery in 1990, multiple heart caths from 2018-this year. Mild MI in July 2018.   PAIN:  Are you having pain? No  PRECAUTIONS: Fall  RED FLAGS: None   WEIGHT BEARING RESTRICTIONS: No  FALLS: Has patient fallen in last 6 months? Yes. Number of falls >5  LIVING ENVIRONMENT: Lives with: lives with their spouse Lives in: House/apartment Stairs: Yes: External: 4 in front and 3 in back steps; Bilateral rails in front and none in back Has following equipment at home: Single point cane, Walker - 2 wheeled, and [employer] - 4 wheeled  PLOF: Needs assistance with homemaking  PATIENT GOALS: "I am not sure"   OBJECTIVE:   DIAGNOSTIC FINDINGS: CT of Head and cervical spine on 05/17/23  IMPRESSION: CT of the head: No acute intracranial abnormality noted.   Atrophic changes are seen.   CT of the cervical spine: Multilevel degenerative change without acute abnormality.  COGNITION: Overall cognitive status:  Impaired   SENSATION: Pt w/bilateral peripheral neuropathy in feet    EDEMA: Pt reports no, wife states yes in BLEs  POSTURE: rounded shoulders, forward head, and increased thoracic kyphosis   LOWER EXTREMITY MMT:  Tested in seated position   MMT Right Eval Left Eval  Hip flexion 5 5  Hip extension    Hip abduction 5 5  Hip adduction 5 5  Hip internal rotation    Hip external rotation    Knee flexion 5 5  Knee extension 5 5  Ankle dorsiflexion 5 5  Ankle plantarflexion    Ankle inversion    Ankle eversion    (Blank rows = not tested)  BED MOBILITY:  Independent per pt  TRANSFERS: Assistive device utilized: None  Sit to stand: Modified independence Stand to sit: Modified independence Heavy reliance on BUE support    GAIT: Gait pattern: step through pattern, decreased arm swing- Right, decreased arm swing- Left, decreased stride length, decreased hip/knee flexion- Right, decreased hip/knee flexion- Left, decreased trunk rotation, and wide BOS Distance walked: Various clinic distances  Assistive device utilized: None Level of assistance: SBA Comments: Pt demonstrates waddle-like gait pattern w/arms maintained in abduction at sides. Pt reports feeling stable w/gait indoors.   FUNCTIONAL TESTS:   [facility] PT Assessment - 06/15/23 1051       Transfers   Five time sit to stand comments  15.97s w/BUE support   Noted weight maintained in heels     Ambulation/Gait   Gait velocity 32.8' over 15.03s = 2.18 ft/s without AD              VITALS Vitals:   06/15/23 1045  BP: (!) 144/74  Pulse: (!) 57  SpO2: 96%     TODAY'S TREATMENT:           Next Session                                                                                                                       PATIENT EDUCATION: Education details: POC, eval findings, 3 balance systems  Person educated: Patient and Spouse Education method: Explanation Education comprehension: verbalized  understanding and needs further education  HOME EXERCISE PROGRAM: To be established  GOALS: Goals reviewed with patient? Yes  SHORT TERM GOALS: Target date: 07/13/2023   Pt will be independent with initial HEP for improved endurance, balance, transfers and gait.  Baseline: not established on eval  Goal status: INITIAL  2.  MCTSIB to be assessed and STG/LTG written  Baseline:  Goal status: INITIAL  3.  to be assessed and STG/LTG written Baseline:  Goal status: INITIAL  4.  Pt will improve gait velocity to at least 2.4 ft/s w/LRAD for improved gait efficiency and endurance  Baseline: 2.18 ft/s w/no AD Goal status: INITIAL  5.  Pt will trial various assistive devices on both indoor and outdoor surfaces to determine least restrictive option that promotes independence and reduces fall risk.  Baseline: Pt has cane, RW and rollator at home  Goal status: INITIAL   LONG TERM GOALS: Target date: 08/10/2023    Pt will be  independent with final HEP for improved strength, balance, transfers and gait.  Baseline:  Goal status: INITIAL  2.  MCTSIB goal Baseline:  Goal status: INITIAL  3.  goal  Baseline:  Goal status: INITIAL  4.  Pt will improve gait velocity to at least 2.7 ft/s w/LRAD for improved gait efficiency and independence  Baseline: 2.18 ft/s w/no AD Goal status: INITIAL  5.  Pt will trial various bracing options on BLEs to determine safest option for reduced fall risk  Baseline:  Goal status: INITIAL   ASSESSMENT:  CLINICAL IMPRESSION: Patient is a 81 year old male referred to Neuro [facility] for neuropathy. Pt's PMH is significant for: peripheral neuropathy, balance disorder, CAD on ASA. The following deficits were present during the exam: impaired cognition, impaired sensation, impaired balance, decreased safety awareness and global deconditioning. Based on falls history, bilateral peripheral neuropathy and gait speed, pt is a high fall risk. Pt would  benefit from skilled PT to address these impairments and functional limitations to maximize functional mobility independence.    OBJECTIVE IMPAIRMENTS: Abnormal gait, cardiopulmonary status limiting activity, decreased activity tolerance, decreased balance, decreased cognition, decreased endurance, decreased knowledge of condition, decreased knowledge of use of DME, difficulty walking, decreased safety awareness, and impaired sensation  ACTIVITY LIMITATIONS: carrying, lifting, bending, standing, squatting, transfers, locomotion level, and caring for others  PARTICIPATION LIMITATIONS: meal prep, cleaning, laundry, medication management, driving, shopping, community activity, and yard work  PERSONAL FACTORS: Fitness, Past/current experiences, Transportation, and 1 comorbidity: bilateral peripheral neuropathy  are also affecting patient's functional outcome.   REHAB POTENTIAL: Good  CLINICAL DECISION MAKING: Stable/uncomplicated  EVALUATION COMPLEXITY: Low  PLAN:  PT FREQUENCY: 1-2x/week  PT DURATION: 8 weeks (POC written for 12 weeks)  PLANNED INTERVENTIONS: Therapeutic exercises, Therapeutic activity, Neuromuscular re-education, Balance training, Gait training, Patient/Family education, Self Care, Joint mobilization, Stair training, Vestibular training, Canalith repositioning, Orthotic/Fit training, DME instructions, Aquatic Therapy, Manual therapy, and Re-evaluation  PLAN FOR NEXT SESSION: Monitor SpO2. , MCTSIB and update goals. Establish HEP based on results of OM. Trial various ADs on level and unlevel surfaces. Can trial bracing on BLEs, but pt has good DF strength. Scifit for endurance   Analiza Cowger E Grisel Blumenstock, PT 06/15/2023, 11:03 AM

## 2023-06-23 ENCOUNTER — Ambulatory Visit: Payer: No Typology Code available for payment source | Admitting: Physical Therapy

## 2023-06-23 VITALS — BP 143/72 | HR 55

## 2023-06-23 DIAGNOSIS — R2681 Unsteadiness on feet: Secondary | ICD-10-CM | POA: Diagnosis not present

## 2023-06-23 DIAGNOSIS — R2689 Other abnormalities of gait and mobility: Secondary | ICD-10-CM

## 2023-06-23 DIAGNOSIS — R296 Repeated falls: Secondary | ICD-10-CM

## 2023-06-23 NOTE — Therapy (Signed)
OUTPATIENT PHYSICAL THERAPY NEURO TREATMENT   Patient Name: Joshua [name]. MRN: [id number] DOB:01/08/1941, 82 y.o., male Today's Date: 06/23/2023   PCP: Nestor Ramp, MD REFERRING PROVIDER: Vladimir Faster, DO  END OF SESSION:  PT End of Session - 06/23/23 4098     Visit Number 2    Number of Visits 17   Plus eval   Date for PT Re-Evaluation 09/07/23   Due to potential delay in scheduling   Authorization Type [employer] [place]    PT Start Time [phone]    PT Stop Time 0930    PT Time Calculation (min) 42 min    Equipment Utilized During Treatment Gait belt    Activity Tolerance Patient tolerated treatment well    Behavior During Therapy WFL for tasks assessed/performed              Past Medical History:  Diagnosis Date   Coronary artery disease involving native coronary artery of native heart with angina pectoris (HCC)    a. 1990 s/p CABG x 4 (RIMA->LAD, LIMA->LCX, VG->Diag, VG->RCA); b. 2011 s/p BMS to VG->RCA.; c. Unstable Angina 05/2017: Patent RIMA-LAD & LIMA-LCx, ostSVG-RCA 65% ISR & mSVG-RCA  99% - DES PCI to both // Myoview 05/2019:  EF 57, no ischemia or scar, Low Risk    GERD (gastroesophageal reflux disease)    History of echocardiogram    a. 01/2017 Echo: EF 55-60%, no rwma, mild MR, midlly dil LA, PASP .   History of hiatal hernia    Hyperlipidemia    Hypertension    Obesity    Past Surgical History:  Procedure Laterality Date   CATARACT EXTRACTION W/ INTRAOCULAR LENS  IMPLANT, BILATERAL Bilateral    CORONARY ANGIOPLASTY WITH STENT PLACEMENT  2011   "RCA"   CORONARY ARTERY BYPASS GRAFT  1990   CABG X4   CORONARY STENT INTERVENTION N/A 06/24/2017   Procedure: Coronary Stent Intervention;  Surgeon: Lyn Records, MD;  Location: [facility] INVASIVE CV LAB;  Service: Cardiovascular: Ostial 4.0 x 12 mm Onyx DES reducing 70% stenosis to less than 40%. Distal body 3.5 x 15 Onyx DES reducing 99% stenosis to less than 30%.   LEFT HEART CATH AND CORS/GRAFTS  ANGIOGRAPHY N/A 06/24/2017   Procedure: Left Heart Cath and Cors/Grafts Angiography;  Surgeon: Lyn Records, MD;  Location: [facility] INVASIVE CV LAB;  Service: Cardiovascular:  CTO native RCA, LAD & LCx (Graft Dependent). Patent LIMA-LCx & RIMA-LAD.  SVG-RCA: ost 65% ISR & mid 99% --> DES PCI to both   LEFT HEART CATH AND CORS/GRAFTS ANGIOGRAPHY N/A 01/04/2021   Procedure: LEFT HEART CATH AND CORS/GRAFTS ANGIOGRAPHY;  Surgeon: Lyn Records, MD;  Location: [facility] INVASIVE CV LAB;  Service: Cardiovascular;  Laterality: N/A;   RIGHT/LEFT HEART CATH AND CORONARY/GRAFT ANGIOGRAPHY N/A 09/25/2022   Procedure: RIGHT/LEFT HEART CATH AND CORONARY/GRAFT ANGIOGRAPHY;  Surgeon: Kathleene Hazel, MD;  Location: [facility] INVASIVE CV LAB;  Service: Cardiovascular;  Laterality: N/A;   Patient Active Problem List   Diagnosis Date Noted   Dyspnea on exertion 05/06/2023   Driving safety issue 47/82/9562   Pulmonary hypertension, unspecified (HCC) 01/28/2023   OSA (obstructive sleep apnea) 01/28/2023   Balance problems 01/31/2022   Dysphagia 01/31/2022   Healthcare maintenance 08/22/2021   History of non-ST elevation myocardial infarction (NSTEMI) 06/25/2017   Presence of drug coated stent in SVG-RCA 06/25/2017   Postherpetic neuralgia 01/02/2017   Tremor 05/15/2016   Irritability and anger 05/15/2016   Seborrheic keratoses 06/16/2014  Special screening for malignant neoplasm of prostate 06/14/2014   Retinal hemorrhage 07/23/2010   Impotence of organic origin 01/16/2010   Essential hypertension 04/18/2009   Hyperlipidemia with target low density lipoprotein (LDL) cholesterol less than 70 mg/dL 14/78/2956   CAD (coronary artery disease) 01/28/2007   HERNIA, HIATAL, NONCONGENITAL 01/28/2007    ONSET DATE: 05/20/2023 (referral)  REFERRING DIAG: G60.9 (ICD-10-CM) - Idiopathic peripheral neuropathy  THERAPY DIAG:  Unsteadiness on feet  Other abnormalities of gait and mobility  Repeated falls  Rationale for  Evaluation and Treatment: Rehabilitation  SUBJECTIVE:                                                                                                                                                                                             SUBJECTIVE STATEMENT: "Skip"  Pt presents without AD. Denies acute changes since last visit. Reports the lateral aspect of his feet bother him sometimes, but not bothering him today.   Pt accompanied by:  Wife, [name] (in lobby)  PERTINENT HISTORY: Open heart surgery in 1990, multiple heart caths from 2018-this year. Mild MI in July 2018.   PAIN:  Are you having pain? No  PRECAUTIONS: Fall  RED FLAGS: None   WEIGHT BEARING RESTRICTIONS: No  FALLS: Has patient fallen in last 6 months? Yes. Number of falls >5  LIVING ENVIRONMENT: Lives with: lives with their spouse Lives in: House/apartment Stairs: Yes: External: 4 in front and 3 in back steps; Bilateral rails in front and none in back Has following equipment at home: Single point cane, Walker - 2 wheeled, and [employer] - 4 wheeled  PLOF: Needs assistance with homemaking  PATIENT GOALS: "I am not sure"   OBJECTIVE:   DIAGNOSTIC FINDINGS: CT of Head and cervical spine on 05/17/23  IMPRESSION: CT of the head: No acute intracranial abnormality noted.   Atrophic changes are seen.   CT of the cervical spine: Multilevel degenerative change without acute abnormality.  COGNITION: Overall cognitive status: Impaired   SENSATION: Pt w/bilateral peripheral neuropathy in feet    EDEMA: Pt reports no, wife states yes in BLEs   POSTURE: rounded shoulders, forward head, and increased thoracic kyphosis   LOWER EXTREMITY MMT:  Tested in seated position   MMT Right Eval Left Eval  Hip flexion 5 5  Hip extension    Hip abduction 5 5  Hip adduction 5 5  Hip internal rotation    Hip external rotation    Knee flexion 5 5  Knee extension 5 5  Ankle dorsiflexion 5 5  Ankle plantarflexion     Ankle inversion    Ankle eversion    (  Blank rows = not tested)  VITALS  Vitals:   06/23/23 0855  BP: (!) 143/72  Pulse: (!) 55     TODAY'S TREATMENT:     Ther Act  MCTSIB: Condition 1: Avg of 3 trials: 30 sec, Condition 2: Avg of 3 trials: 30 sec (minor A/P sway), Condition 3: Avg of 3 trials: 30 sec (minor A/P sway, downward gaze), Condition 4: Avg of 3 trials: 30 sec (moderate A/P sway), and Total Score: 120/120  Pre-test vitals: SpO2 97%, dyspnea of 0/10, RPE of 0/10, HR 55 bpm  Gait pattern: step through pattern, decreased arm swing- Right, decreased arm swing- Left, decreased stride length, decreased hip/knee flexion- Right, decreased hip/knee flexion- Left, decreased ankle dorsiflexion- Right, decreased ankle dorsiflexion- Left, shuffling, decreased trunk rotation, wide BOS, poor foot clearance- Right, and poor foot clearance- Left Distance walked: 115' loop completed 3x = 345'  Assistive device utilized: None Level of assistance: SBA Comments: Pt ambulating very slowly, averaging 110-120s per lap. Pt ambulates w/waddle-like pattern and decreased step clearance of LLE > RLE. Pt reported bilateral hip discomfort after 200' but was able to continue walking. Pt maintains arms in abduction out to sides.   Post-test: SpO2 97%, dyspnea of 3/10, RPE of 8/10, HR 75 bpm   Gait Training  Gait pattern: step through pattern and decreased stride length Distance walked: 115' around clinic Assistive device utilized: Environmental consultant - 4 wheeled Level of assistance: Modified independence Comments: Trialed use of rollator as pt has one at home and noted increased step clearance, step length and cadence w/use of AD. "This is easy". Continue to encourage pt to use AD for community distances and unlevel surfaces, but pt reports this is "embarrassing". Plan to trial on grass/gravel/inclines next session.     RPE of 9/10 following session                                                                                                                         PATIENT EDUCATION: Education details: Endurance and balance assessment, encouragement to use rollator for improved endurance  Person educated: Patient Education method: Medical illustrator Education comprehension: verbalized understanding and needs further education  HOME EXERCISE PROGRAM: To be established  GOALS: Goals reviewed with patient? Yes  SHORT TERM GOALS: Target date: 07/13/2023   Pt will be independent with initial HEP for improved endurance, balance, transfers and gait.  Baseline: not established on eval  Goal status: INITIAL  2.  MCTSIB to be assessed and STG/LTG written  Baseline: 120/120 (7/23) Goal status: DC due to high baseline score   3.  Pt will ambulate greater than or equal to 395 feet on with LRAD mod I for improved cardiovascular endurance and BLE strength.   Baseline: 345' no AD and SBA  Goal status: REVISED  4.  Pt will improve gait velocity to at least 2.4 ft/s w/LRAD for improved gait efficiency and endurance  Baseline: 2.18 ft/s w/no AD Goal status: INITIAL  5.  Pt will trial various assistive devices on both indoor and outdoor surfaces to determine least restrictive option that promotes independence and reduces fall risk.  Baseline: Pt has cane, RW and rollator at home; trialed rollator on 7/23 Goal status: IN PROGRESS   LONG TERM GOALS: Target date: 08/10/2023    Pt will be independent with final HEP for improved strength, balance, transfers and gait.  Baseline:  Goal status: INITIAL  2.  MCTSIB goal Baseline: 120/120 (7/23) Goal status: DC due to high baseline score   3.  Pt will ambulate greater than or equal to 450 feet on with LRAD mod I for improved cardiovascular endurance and BLE strength.    Baseline: 345' no AD and SBA  Goal status: REVISED   4.  Pt will improve gait velocity to at least 2.7 ft/s w/LRAD for improved gait efficiency and  independence  Baseline: 2.18 ft/s w/no AD Goal status: INITIAL  5.  Pt will trial various bracing options on BLEs to determine safest option for reduced fall risk  Baseline:  Goal status: INITIAL   ASSESSMENT:  CLINICAL IMPRESSION: Emphasis of skilled PT session on endurance assessment, balance assessment and trialing rollator. Pt scored a 120/120 on MCTSIB, indicative of functional vestibular, vision and somatosensory input for balance. Pt did have minor difficulty w/condition 4 of MCTSIB, but no LOB. Pt ambulated 345' on , which is significantly below age-adjusted norms. Pt ambulates w/very guarded, waddle-like pattern and shuffles his feet. Trialed rollator and pt demonstrated increased cadence, step length and clearance. Pt denied SOB throughout session and SpO2 maintained >96% on room air. Continue POC.    OBJECTIVE IMPAIRMENTS: Abnormal gait, cardiopulmonary status limiting activity, decreased activity tolerance, decreased balance, decreased cognition, decreased endurance, decreased knowledge of condition, decreased knowledge of use of DME, difficulty walking, decreased safety awareness, and impaired sensation  ACTIVITY LIMITATIONS: carrying, lifting, bending, standing, squatting, transfers, locomotion level, and caring for others  PARTICIPATION LIMITATIONS: meal prep, cleaning, laundry, medication management, driving, shopping, community activity, and yard work  PERSONAL FACTORS: Fitness, Past/current experiences, Transportation, and 1 comorbidity: bilateral peripheral neuropathy  are also affecting patient's functional outcome.   REHAB POTENTIAL: Good  CLINICAL DECISION MAKING: Stable/uncomplicated  EVALUATION COMPLEXITY: Low  PLAN:  PT FREQUENCY: 1-2x/week  PT DURATION: 8 weeks (POC written for 12 weeks)  PLANNED INTERVENTIONS: Therapeutic exercises, Therapeutic activity, Neuromuscular re-education, Balance training, Gait training, Patient/Family education, Self Care,  Joint mobilization, Stair training, Vestibular training, Canalith repositioning, Orthotic/Fit training, DME instructions, Aquatic Therapy, Manual therapy, and Re-evaluation  PLAN FOR NEXT SESSION: Monitor SpO2. Establish HEP based on results of OM. Trial various ADs on level and unlevel surfaces. Can trial bracing on BLEs, but pt has good DF strength. Scifit for endurance.    Jill Alexanders Crystallee Werden, PT, DPT 06/23/2023, 9:32 AM

## 2023-06-26 ENCOUNTER — Ambulatory Visit: Payer: No Typology Code available for payment source | Admitting: Physical Therapy

## 2023-06-26 DIAGNOSIS — M6281 Muscle weakness (generalized): Secondary | ICD-10-CM

## 2023-06-26 DIAGNOSIS — R2681 Unsteadiness on feet: Secondary | ICD-10-CM

## 2023-06-26 DIAGNOSIS — R2689 Other abnormalities of gait and mobility: Secondary | ICD-10-CM

## 2023-06-26 NOTE — Therapy (Signed)
OUTPATIENT PHYSICAL THERAPY NEURO TREATMENT   Patient Name: Joshua [name]. MRN: [id number] DOB:08-22-1941, 82 y.o., male Today's Date: 06/26/2023   PCP: Nestor Ramp, MD REFERRING PROVIDER: Vladimir Faster, DO  END OF SESSION:  PT End of Session - 06/26/23 0850     Visit Number 3    Number of Visits 17   Plus eval   Date for PT Re-Evaluation 09/07/23   Due to potential delay in scheduling   Authorization Type [employer] [place]    PT Start Time [phone]    PT Stop Time 0931    PT Time Calculation (min) 43 min    Equipment Utilized During Treatment Gait belt    Activity Tolerance Patient tolerated treatment well    Behavior During Therapy [facility] for tasks assessed/performed              Past Medical History:  Diagnosis Date   Coronary artery disease involving native coronary artery of native heart with angina pectoris (HCC)    a. 1990 s/p CABG x 4 (RIMA->LAD, LIMA->LCX, VG->Diag, VG->RCA); b. 2011 s/p BMS to VG->RCA.; c. Unstable Angina 05/2017: Patent RIMA-LAD & LIMA-LCx, ostSVG-RCA 65% ISR & mSVG-RCA  99% - DES PCI to both // Myoview 05/2019:  EF 57, no ischemia or scar, Low Risk    GERD (gastroesophageal reflux disease)    History of echocardiogram    a. 01/2017 Echo: EF 55-60%, no rwma, mild MR, midlly dil LA, PASP .   History of hiatal hernia    Hyperlipidemia    Hypertension    Obesity    Past Surgical History:  Procedure Laterality Date   CATARACT EXTRACTION W/ INTRAOCULAR LENS  IMPLANT, BILATERAL Bilateral    CORONARY ANGIOPLASTY WITH STENT PLACEMENT  2011   "RCA"   CORONARY ARTERY BYPASS GRAFT  1990   CABG X4   CORONARY STENT INTERVENTION N/A 06/24/2017   Procedure: Coronary Stent Intervention;  Surgeon: Lyn Records, MD;  Location: [facility] INVASIVE CV LAB;  Service: Cardiovascular: Ostial 4.0 x 12 mm Onyx DES reducing 70% stenosis to less than 40%. Distal body 3.5 x 15 Onyx DES reducing 99% stenosis to less than 30%.   LEFT HEART CATH AND CORS/GRAFTS  ANGIOGRAPHY N/A 06/24/2017   Procedure: Left Heart Cath and Cors/Grafts Angiography;  Surgeon: Lyn Records, MD;  Location: [facility] INVASIVE CV LAB;  Service: Cardiovascular:  CTO native RCA, LAD & LCx (Graft Dependent). Patent LIMA-LCx & RIMA-LAD.  SVG-RCA: ost 65% ISR & mid 99% --> DES PCI to both   LEFT HEART CATH AND CORS/GRAFTS ANGIOGRAPHY N/A 01/04/2021   Procedure: LEFT HEART CATH AND CORS/GRAFTS ANGIOGRAPHY;  Surgeon: Lyn Records, MD;  Location: [facility] INVASIVE CV LAB;  Service: Cardiovascular;  Laterality: N/A;   RIGHT/LEFT HEART CATH AND CORONARY/GRAFT ANGIOGRAPHY N/A 09/25/2022   Procedure: RIGHT/LEFT HEART CATH AND CORONARY/GRAFT ANGIOGRAPHY;  Surgeon: Kathleene Hazel, MD;  Location: [facility] INVASIVE CV LAB;  Service: Cardiovascular;  Laterality: N/A;   Patient Active Problem List   Diagnosis Date Noted   Dyspnea on exertion 05/06/2023   Driving safety issue 11/91/4782   Pulmonary hypertension, unspecified (HCC) 01/28/2023   OSA (obstructive sleep apnea) 01/28/2023   Balance problems 01/31/2022   Dysphagia 01/31/2022   Healthcare maintenance 08/22/2021   History of non-ST elevation myocardial infarction (NSTEMI) 06/25/2017   Presence of drug coated stent in SVG-RCA 06/25/2017   Postherpetic neuralgia 01/02/2017   Tremor 05/15/2016   Irritability and anger 05/15/2016   Seborrheic keratoses 06/16/2014  Special screening for malignant neoplasm of prostate 06/14/2014   Retinal hemorrhage 07/23/2010   Impotence of organic origin 01/16/2010   Essential hypertension 04/18/2009   Hyperlipidemia with target low density lipoprotein (LDL) cholesterol less than 70 mg/dL 62/13/0865   CAD (coronary artery disease) 01/28/2007   HERNIA, HIATAL, NONCONGENITAL 01/28/2007    ONSET DATE: 05/20/2023 (referral)  REFERRING DIAG: G60.9 (ICD-10-CM) - Idiopathic peripheral neuropathy  THERAPY DIAG:  Unsteadiness on feet  Other abnormalities of gait and mobility  Muscle weakness  (generalized)  Rationale for Evaluation and Treatment: Rehabilitation  SUBJECTIVE:                                                                                                                                                                                             SUBJECTIVE STATEMENT: "Skip"  Pt presents without AD. Denies acute changes since last visit. Wife, [name], asking therapist to trial bracing w/pt today.   Pt accompanied by:  Wife, [name] (in lobby)  PERTINENT HISTORY: Open heart surgery in 1990, multiple heart caths from 2018-this year. Mild MI in July 2018.   PAIN:  Are you having pain? No  PRECAUTIONS: Fall  RED FLAGS: None   WEIGHT BEARING RESTRICTIONS: No  FALLS: Has patient fallen in last 6 months? Yes. Number of falls >5  LIVING ENVIRONMENT: Lives with: lives with their spouse Lives in: House/apartment Stairs: Yes: External: 4 in front and 3 in back steps; Bilateral rails in front and none in back Has following equipment at home: Single point cane, Walker - 2 wheeled, and [employer] - 4 wheeled  PLOF: Needs assistance with homemaking  PATIENT GOALS: "I am not sure"   OBJECTIVE:   DIAGNOSTIC FINDINGS: CT of Head and cervical spine on 05/17/23  IMPRESSION: CT of the head: No acute intracranial abnormality noted.   Atrophic changes are seen.   CT of the cervical spine: Multilevel degenerative change without acute abnormality.  COGNITION: Overall cognitive status: Impaired   SENSATION: Pt w/bilateral peripheral neuropathy in feet    EDEMA: Pt reports no, wife states yes in BLEs   POSTURE: rounded shoulders, forward head, and increased thoracic kyphosis   LOWER EXTREMITY MMT:  Tested in seated position   MMT Right Eval Left Eval  Hip flexion 5 5  Hip extension    Hip abduction 5 5  Hip adduction 5 5  Hip internal rotation    Hip external rotation    Knee flexion 5 5  Knee extension 5 5  Ankle dorsiflexion 5 5  Ankle plantarflexion     Ankle inversion    Ankle eversion    (Blank rows =  not tested)  VITALS  There were no vitals filed for this visit.    TODAY'S TREATMENT:     Gait Training  Gait pattern: step through pattern, decreased arm swing- Right, decreased arm swing- Left, and decreased stride length Distance walked: 20'  Assistive device utilized: None Level of assistance: SBA Comments: Trialed use of bilateral [place] WalkOn AFOs, but pt did not like braces at all and felt more unstable with them on. Will not pursue bracing at this time. Noted pt's shoes very heavy, encouraged pt wear lighter weight shoes in future sessions.   Ther Ex SciFit multi-peaks level 2 for 8 minutes using BUE/BLEs for neural priming for reciprocal movement, dynamic cardiovascular warmup and global strength. RPE of 5/10 following activity. SpO2 at 96%, HR 72 bpm.   NMR  At ballet bar for improved endurance, single leg stability, LE coordination and increased step length: Lateral advance/retreat over 4" hurdle, x10 per side w/BUE support. Increased difficulty performing on R side, SBA throughout  On Airex, alt cone taps w/BUE support x5 reps per side and progressing to no UE support x5 reps per side w/CGA. Pt performed well without UE support w/one minor anterior LOB that he was able to self-correct. Increased difficulty tapping w/LLE >RLE  Mini squats on airex without UE support, x10 reps. No instability noted    RPE of 6/10 following session, SpO2 at 97%, HR 72 bpm                                                                                                                        PATIENT EDUCATION: Education details: Continued encouragement to use rollator for improved endurance, encouraged pt to wear different shoes next session  Person educated: Patient Education method: Medical illustrator Education comprehension: verbalized understanding and needs further education  HOME EXERCISE PROGRAM: To be  established  GOALS: Goals reviewed with patient? Yes  SHORT TERM GOALS: Target date: 07/13/2023   Pt will be independent with initial HEP for improved endurance, balance, transfers and gait.  Baseline: not established on eval  Goal status: INITIAL  2.  MCTSIB to be assessed and STG/LTG written  Baseline: 120/120 (7/23) Goal status: DC due to high baseline score   3.  Pt will ambulate greater than or equal to 395 feet on with LRAD mod I for improved cardiovascular endurance and BLE strength.   Baseline: 345' no AD and SBA  Goal status: REVISED  4.  Pt will improve gait velocity to at least 2.4 ft/s w/LRAD for improved gait efficiency and endurance  Baseline: 2.18 ft/s w/no AD Goal status: INITIAL  5.  Pt will trial various assistive devices on both indoor and outdoor surfaces to determine least restrictive option that promotes independence and reduces fall risk.  Baseline: Pt has cane, RW and rollator at home; trialed rollator on 7/23 Goal status: IN PROGRESS   LONG TERM GOALS: Target date: 08/10/2023    Pt will be independent with final HEP for  improved strength, balance, transfers and gait.  Baseline:  Goal status: INITIAL  2.  MCTSIB goal Baseline: 120/120 (7/23) Goal status: DC due to high baseline score   3.  Pt will ambulate greater than or equal to 450 feet on with LRAD mod I for improved cardiovascular endurance and BLE strength.    Baseline: 345' no AD and SBA  Goal status: REVISED   4.  Pt will improve gait velocity to at least 2.7 ft/s w/LRAD for improved gait efficiency and independence  Baseline: 2.18 ft/s w/no AD Goal status: INITIAL  5.  Pt will trial various bracing options on BLEs to determine safest option for reduced fall risk  Baseline: Trialed on 7/26, pt does not like  Goal status: MET   ASSESSMENT:  CLINICAL IMPRESSION: Emphasis of skilled PT session on gait training w/bilateral AFOs, endurance, single leg stability and LE  coordination. Pt did not like AFOs and stated feeling more unsteady with them on. Pt also unable to don/doff braces well, so will not pursue at this time. Pt continues to be most limited by global deconditioning, but tolerated session well. Pt states he is most challenged when ambulating on grassy surfaces, so will work on high level balance tasks in future sessions. Continue POC.    OBJECTIVE IMPAIRMENTS: Abnormal gait, cardiopulmonary status limiting activity, decreased activity tolerance, decreased balance, decreased cognition, decreased endurance, decreased knowledge of condition, decreased knowledge of use of DME, difficulty walking, decreased safety awareness, and impaired sensation  ACTIVITY LIMITATIONS: carrying, lifting, bending, standing, squatting, transfers, locomotion level, and caring for others  PARTICIPATION LIMITATIONS: meal prep, cleaning, laundry, medication management, driving, shopping, community activity, and yard work  PERSONAL FACTORS: Fitness, Past/current experiences, Transportation, and 1 comorbidity: bilateral peripheral neuropathy  are also affecting patient's functional outcome.   REHAB POTENTIAL: Good  CLINICAL DECISION MAKING: Stable/uncomplicated  EVALUATION COMPLEXITY: Low  PLAN:  PT FREQUENCY: 1-2x/week  PT DURATION: 8 weeks (POC written for 12 weeks)  PLANNED INTERVENTIONS: Therapeutic exercises, Therapeutic activity, Neuromuscular re-education, Balance training, Gait training, Patient/Family education, Self Care, Joint mobilization, Stair training, Vestibular training, Canalith repositioning, Orthotic/Fit training, DME instructions, Aquatic Therapy, Manual therapy, and Re-evaluation  PLAN FOR NEXT SESSION: Monitor SpO2. Establish HEP based on results of OM.  Practice rollator on unlevel surfaces. Scifit for endurance. Blaze pods, high level balance tasks    Denia Mcvicar E Eboni Coval, PT, DPT 06/26/2023, 10:02 AM

## 2023-07-01 ENCOUNTER — Ambulatory Visit: Payer: No Typology Code available for payment source | Admitting: Physical Therapy

## 2023-07-01 DIAGNOSIS — G4733 Obstructive sleep apnea (adult) (pediatric): Secondary | ICD-10-CM | POA: Diagnosis not present

## 2023-07-01 DIAGNOSIS — R2681 Unsteadiness on feet: Secondary | ICD-10-CM | POA: Diagnosis not present

## 2023-07-01 DIAGNOSIS — R2689 Other abnormalities of gait and mobility: Secondary | ICD-10-CM

## 2023-07-01 DIAGNOSIS — M6281 Muscle weakness (generalized): Secondary | ICD-10-CM

## 2023-07-01 NOTE — Therapy (Signed)
OUTPATIENT PHYSICAL THERAPY NEURO TREATMENT   Patient Name: Joshua [name]. MRN: [id number] DOB:1941-01-06, 82 y.o., male Today's Date: 07/01/2023   PCP: Joshua Ramp, MD REFERRING PROVIDER: Vladimir Faster, DO  END OF SESSION:  PT End of Session - 07/01/23 0933     Visit Number 4    Number of Visits 17   Plus eval   Date for PT Re-Evaluation 09/07/23   Due to potential delay in scheduling   Authorization Type [employer] [place]    PT Start Time 0932    PT Stop Time 1015    PT Time Calculation (min) 43 min    Equipment Utilized During Treatment Gait belt    Activity Tolerance Patient tolerated treatment well    Behavior During Therapy WFL for tasks assessed/performed              Past Medical History:  Diagnosis Date   Coronary artery disease involving native coronary artery of native heart with angina pectoris (HCC)    a. 1990 s/p CABG x 4 (RIMA->LAD, LIMA->LCX, VG->Diag, VG->RCA); b. 2011 s/p BMS to VG->RCA.; c. Unstable Angina 05/2017: Patent RIMA-LAD & LIMA-LCx, ostSVG-RCA 65% ISR & mSVG-RCA  99% - DES PCI to both // Myoview 05/2019:  EF 57, no ischemia or scar, Low Risk    GERD (gastroesophageal reflux disease)    History of echocardiogram    a. 01/2017 Echo: EF 55-60%, no rwma, mild MR, midlly dil LA, PASP .   History of hiatal hernia    Hyperlipidemia    Hypertension    Obesity    Past Surgical History:  Procedure Laterality Date   CATARACT EXTRACTION W/ INTRAOCULAR LENS  IMPLANT, BILATERAL Bilateral    CORONARY ANGIOPLASTY WITH STENT PLACEMENT  2011   "RCA"   CORONARY ARTERY BYPASS GRAFT  1990   CABG X4   CORONARY STENT INTERVENTION N/A 06/24/2017   Procedure: Coronary Stent Intervention;  Surgeon: Joshua Records, MD;  Location: [facility];  Service: Cardiovascular: Ostial 4.0 x 12 mm Onyx DES reducing 70% stenosis to less than 40%. Distal body 3.5 x 15 Onyx DES reducing 99% stenosis to less than 30%.   LEFT HEART CATH AND CORS/GRAFTS  ANGIOGRAPHY N/A 06/24/2017   Procedure: Left Heart Cath and Cors/Grafts Angiography;  Surgeon: Joshua Records, MD;  Location: [facility];  Service: Cardiovascular:  CTO native RCA, LAD & LCx (Graft Dependent). Patent LIMA-LCx & RIMA-LAD.  SVG-RCA: ost 65% ISR & mid 99% --> DES PCI to both   LEFT HEART CATH AND CORS/GRAFTS ANGIOGRAPHY N/A 01/04/2021   Procedure: LEFT HEART CATH AND CORS/GRAFTS ANGIOGRAPHY;  Surgeon: Joshua Records, MD;  Location: [facility];  Service: Cardiovascular;  Laterality: N/A;   RIGHT/LEFT HEART CATH AND CORONARY/GRAFT ANGIOGRAPHY N/A 09/25/2022   Procedure: RIGHT/LEFT HEART CATH AND CORONARY/GRAFT ANGIOGRAPHY;  Surgeon: Joshua Hazel, MD;  Location: [facility];  Service: Cardiovascular;  Laterality: N/A;   Patient Active Problem List   Diagnosis Date Noted   Dyspnea on exertion 05/06/2023   Driving safety issue 40/98/1191   Pulmonary hypertension, unspecified (HCC) 01/28/2023   OSA (obstructive sleep apnea) 01/28/2023   Balance problems 01/31/2022   Dysphagia 01/31/2022   Healthcare maintenance 08/22/2021   History of non-ST elevation myocardial infarction (NSTEMI) 06/25/2017   Presence of drug coated stent in SVG-RCA 06/25/2017   Postherpetic neuralgia 01/02/2017   Tremor 05/15/2016   Irritability and anger 05/15/2016   Seborrheic keratoses 06/16/2014  Special screening for malignant neoplasm of prostate 06/14/2014   Retinal hemorrhage 07/23/2010   Impotence of organic origin 01/16/2010   Essential hypertension 04/18/2009   Hyperlipidemia with target low density lipoprotein (LDL) cholesterol less than 70 mg/dL 27/25/3664   CAD (coronary artery disease) 01/28/2007   HERNIA, HIATAL, NONCONGENITAL 01/28/2007    ONSET DATE: 05/20/2023 (referral)  REFERRING DIAG: G60.9 (ICD-10-CM) - Idiopathic peripheral neuropathy  THERAPY DIAG:  Unsteadiness on feet  Other abnormalities of gait and mobility  Muscle weakness  (generalized)  Rationale for Evaluation and Treatment: Rehabilitation  SUBJECTIVE:                                                                                                                                                                                             SUBJECTIVE STATEMENT: "Skip"  Pt presents without AD. Denies acute changes since last visit. No falls. Needs to mow his grass but the weather has not been cooperating   Pt accompanied by:  Joshua [name] (in lobby)  PERTINENT HISTORY: Open heart surgery in 1990, multiple heart caths from 2018-this year. Mild MI in July 2018.   PAIN:  Are you having pain? No  PRECAUTIONS: Fall  RED FLAGS: None   WEIGHT BEARING RESTRICTIONS: No  FALLS: Has patient fallen in last 6 months? Yes. Number of falls >5  LIVING ENVIRONMENT: Lives with: lives with their spouse Lives in: House/apartment Stairs: Yes: External: 4 in front and 3 in back steps; Bilateral rails in front and none in back Has following equipment at home: Single point cane, Walker - 2 wheeled, and [employer] - 4 wheeled  PLOF: Needs assistance with homemaking  PATIENT GOALS: "I am not sure"   OBJECTIVE:   DIAGNOSTIC FINDINGS: CT of Head and cervical spine on 05/17/23  IMPRESSION: CT of the head: No acute intracranial abnormality noted.   Atrophic changes are seen.   CT of the cervical spine: Multilevel degenerative change without acute abnormality.  COGNITION: Overall cognitive status: Impaired   SENSATION: Pt w/bilateral peripheral neuropathy in feet    EDEMA: Pt reports no, wife states yes in BLEs   POSTURE: rounded shoulders, forward head, and increased thoracic kyphosis   LOWER EXTREMITY MMT:  Tested in seated position   MMT Right Eval Left Eval  Hip flexion 5 5  Hip extension    Hip abduction 5 5  Hip adduction 5 5  Hip internal rotation    Hip external rotation    Knee flexion 5 5  Knee extension 5 5  Ankle dorsiflexion 5 5  Ankle  plantarflexion    Ankle inversion    Ankle eversion    (  Blank rows = not tested)  VITALS  There were no vitals filed for this visit.    TODAY'S TREATMENT:    Ther Act   [facility] PT Assessment - 07/01/23 0937       Functional Gait  Assessment   Gait assessed  Yes    Gait Level Surface Walks 20 ft, slow speed, abnormal gait pattern, evidence for imbalance or deviates 10-15 in outside of the 12 in walkway width. Requires more than 7 sec to ambulate 20 ft.   10.69s   Change in Gait Speed Makes only minor adjustments to walking speed, or accomplishes a change in speed with significant gait deviations, deviates 10-15 in outside the 12 in walkway width, or changes speed but loses balance but is able to recover and continue walking.    Gait with Horizontal Head Turns Performs head turns smoothly with no change in gait. Deviates no more than 6 in outside 12 in walkway width    Gait with Vertical Head Turns Performs head turns with no change in gait. Deviates no more than 6 in outside 12 in walkway width.    Gait and Pivot Turn Pivot turns safely in greater than 3 sec and stops with no loss of balance, or pivot turns safely within 3 sec and stops with mild imbalance, requires small steps to catch balance.    Step Over Obstacle Is able to step over one shoe box (4.5 in total height) but must slow down and adjust steps to clear box safely. May require verbal cueing.    Gait with Narrow Base of Support Ambulates 4-7 steps.    Gait with Eyes Closed Cannot walk 20 ft without assistance, severe gait deviations or imbalance, deviates greater than 15 in outside 12 in walkway width or will not attempt task.    Ambulating Backwards Walks 20 ft, slow speed, abnormal gait pattern, evidence for imbalance, deviates 10-15 in outside 12 in walkway width.   28.87s   Steps Alternating feet, must use rail.    Total Score 15    FGA comment: High fall risk             Ther Ex  Established HEP (see bolded below)  based on outcome of FGA. Pt most challenged by [facility] w/head turns tasks, w/moderate postural sway (L>R). No major LOB noted.   NMR  At ballet bar for improved LE coordination, step clearance and single leg stability:  Navigating 3 4" hurdles laterally to tap various colored dots (called out by therapist) x15 reps w/CGA and no UE support. Pt more challenged when stepping to L side and frequently hit hurdle w/RLE. Pt reports his legs being fatigued following activity but denied SOB.                                                                                                              PATIENT EDUCATION: Education details: Initial HEP, FGA outcome  Person educated: Patient Education method: Explanation, Demonstration, and Handouts Education comprehension: verbalized understanding and returned demonstration  HOME EXERCISE  PROGRAM: Access Code: [email or web address] URL: [email or web address] Date: 07/01/2023 Prepared by: Alethia Berthold Severus Brodzinski  Exercises - Standing with feet together and eyes closed with head turns   - 1 x daily - 7 x weekly - 30-45 second  hold - Tandem Stance with Eyes Closed in Corner  - 1 x daily - 7 x weekly - 30-45 second hold  GOALS: Goals reviewed with patient? Yes  SHORT TERM GOALS: Target date: 07/13/2023   Pt will be independent with initial HEP for improved endurance, balance, transfers and gait.  Baseline: not established on eval  Goal status: INITIAL  2.  MCTSIB to be assessed and STG/LTG written  Baseline: 120/120 (7/23) Goal status: DC due to high baseline score   3.  Pt will ambulate greater than or equal to 395 feet on with LRAD mod I for improved cardiovascular endurance and BLE strength.   Baseline: 345' no AD and SBA  Goal status: REVISED  4.  Pt will improve gait velocity to at least 2.4 ft/s w/LRAD for improved gait efficiency and endurance  Baseline: 2.18 ft/s w/no AD Goal status: INITIAL  5.  Pt will trial various assistive devices  on both indoor and outdoor surfaces to determine least restrictive option that promotes independence and reduces fall risk.  Baseline: Pt has cane, RW and rollator at home; trialed rollator on 7/23 Goal status: IN PROGRESS   LONG TERM GOALS: Target date: 08/10/2023    Pt will be independent with final HEP for improved strength, balance, transfers and gait.  Baseline:  Goal status: INITIAL  2.  Pt will improve FGA to 20 for decreased fall risk   Baseline: 15/30 (7/31) Goal status: NEW   3.  Pt will ambulate greater than or equal to 450 feet on with LRAD mod I for improved cardiovascular endurance and BLE strength.    Baseline: 345' no AD and SBA  Goal status: REVISED   4.  Pt will improve gait velocity to at least 2.7 ft/s w/LRAD for improved gait efficiency and independence  Baseline: 2.18 ft/s w/no AD Goal status: INITIAL  5.  Pt will trial various bracing options on BLEs to determine safest option for reduced fall risk  Baseline: Trialed on 7/26, pt does not like  Goal status: MET   ASSESSMENT:  CLINICAL IMPRESSION: Emphasis of skilled PT session on balance assessment, establishing initial HEP for improved vestibular input and BLE strength. Pt scored a 15/30 on FGA, indicative of high fall risk. Pt most challenged by stepping over obstacles, retro gait and gait w/EC. Established HEP based off these deficits and educated pt on performing these at home w/his back to a corner and a sturdy chair in front of him for safety. Pt tolerated entire session w/o seated rest break this date. Pt demonstrates increased lateral deviations to L side > R side and relies heavily on visual input for balance. Continue POC.    OBJECTIVE IMPAIRMENTS: Abnormal gait, cardiopulmonary status limiting activity, decreased activity tolerance, decreased balance, decreased cognition, decreased endurance, decreased knowledge of condition, decreased knowledge of use of DME, difficulty walking, decreased  safety awareness, and impaired sensation  ACTIVITY LIMITATIONS: carrying, lifting, bending, standing, squatting, transfers, locomotion level, and caring for others  PARTICIPATION LIMITATIONS: meal prep, cleaning, laundry, medication management, driving, shopping, community activity, and yard work  PERSONAL FACTORS: Fitness, Past/current experiences, Transportation, and 1 comorbidity: bilateral peripheral neuropathy  are also affecting patient's functional outcome.   REHAB POTENTIAL: Good  CLINICAL DECISION MAKING: Stable/uncomplicated  EVALUATION  COMPLEXITY: Low  PLAN:  PT FREQUENCY: 1-2x/week  PT DURATION: 8 weeks (POC written for 12 weeks)  PLANNED INTERVENTIONS: Therapeutic exercises, Therapeutic activity, Neuromuscular re-education, Balance training, Gait training, Patient/Family education, Self Care, Joint mobilization, Stair training, Vestibular training, Canalith repositioning, Orthotic/Fit training, DME instructions, Aquatic Therapy, Manual therapy, and Re-evaluation  PLAN FOR NEXT SESSION: Monitor SpO2. Establish HEP based on results of OM.  Practice rollator on unlevel surfaces. Scifit for endurance. Blaze pods, high level balance tasks, EC tasks    Jamarquis Crull E Dyneshia Baccam, PT, DPT 07/01/2023, 12:16 PM

## 2023-07-03 ENCOUNTER — Ambulatory Visit: Payer: No Typology Code available for payment source | Attending: Neurology | Admitting: Physical Therapy

## 2023-07-03 DIAGNOSIS — M6281 Muscle weakness (generalized): Secondary | ICD-10-CM | POA: Diagnosis not present

## 2023-07-03 DIAGNOSIS — R2681 Unsteadiness on feet: Secondary | ICD-10-CM | POA: Insufficient documentation

## 2023-07-03 DIAGNOSIS — R2689 Other abnormalities of gait and mobility: Secondary | ICD-10-CM | POA: Diagnosis not present

## 2023-07-03 DIAGNOSIS — R296 Repeated falls: Secondary | ICD-10-CM | POA: Insufficient documentation

## 2023-07-03 NOTE — Therapy (Signed)
OUTPATIENT PHYSICAL THERAPY NEURO TREATMENT   Patient Name: Joshua [name]. MRN: [id number] DOB:[birth year], 82 y.o., male Today's Date: 07/03/2023   PCP: Nestor Ramp, MD REFERRING PROVIDER: Vladimir Faster, DO  END OF SESSION:  PT End of Session - 07/03/23 1021     Visit Number 5    Number of Visits 17   Plus eval   Date for PT Re-Evaluation 09/07/23   Due to potential delay in scheduling   Authorization Type [employer] [place]    PT Start Time 1020    PT Stop Time 1101    PT Time Calculation (min) 41 min    Equipment Utilized During Treatment Gait belt    Activity Tolerance Patient tolerated treatment well    Behavior During Therapy WFL for tasks assessed/performed              Past Medical History:  Diagnosis Date   Coronary artery disease involving native coronary artery of native heart with angina pectoris (HCC)    a. 1990 s/p CABG x 4 (RIMA->LAD, LIMA->LCX, VG->Diag, VG->RCA); b. 2011 s/p BMS to VG->RCA.; c. Unstable Angina 05/2017: Patent RIMA-LAD & LIMA-LCx, ostSVG-RCA 65% ISR & mSVG-RCA  99% - DES PCI to both // Myoview 05/2019:  EF 57, no ischemia or scar, Low Risk    GERD (gastroesophageal reflux disease)    History of echocardiogram    a. 01/2017 Echo: EF 55-60%, no rwma, mild MR, midlly dil LA, PASP .   History of hiatal hernia    Hyperlipidemia    Hypertension    Obesity    Past Surgical History:  Procedure Laterality Date   CATARACT EXTRACTION W/ INTRAOCULAR LENS  IMPLANT, BILATERAL Bilateral    CORONARY ANGIOPLASTY WITH STENT PLACEMENT  2011   "RCA"   CORONARY ARTERY BYPASS GRAFT  1990   CABG X4   CORONARY STENT INTERVENTION N/A 06/24/2017   Procedure: Coronary Stent Intervention;  Surgeon: Lyn Records, MD;  Location: [facility];  Service: Cardiovascular: Ostial 4.0 x 12 mm Onyx DES reducing 70% stenosis to less than 40%. Distal body 3.5 x 15 Onyx DES reducing 99% stenosis to less than 30%.   LEFT HEART CATH AND CORS/GRAFTS  ANGIOGRAPHY N/A 06/24/2017   Procedure: Left Heart Cath and Cors/Grafts Angiography;  Surgeon: Lyn Records, MD;  Location: [facility];  Service: Cardiovascular:  CTO native RCA, LAD & LCx (Graft Dependent). Patent LIMA-LCx & RIMA-LAD.  SVG-RCA: ost 65% ISR & mid 99% --> DES PCI to both   LEFT HEART CATH AND CORS/GRAFTS ANGIOGRAPHY N/A 01/04/2021   Procedure: LEFT HEART CATH AND CORS/GRAFTS ANGIOGRAPHY;  Surgeon: Lyn Records, MD;  Location: [facility];  Service: Cardiovascular;  Laterality: N/A;   RIGHT/LEFT HEART CATH AND CORONARY/GRAFT ANGIOGRAPHY N/A 09/25/2022   Procedure: RIGHT/LEFT HEART CATH AND CORONARY/GRAFT ANGIOGRAPHY;  Surgeon: Kathleene Hazel, MD;  Location: [facility];  Service: Cardiovascular;  Laterality: N/A;   Patient Active Problem List   Diagnosis Date Noted   Dyspnea on exertion 05/06/2023   Driving safety issue 93/23/5573   Pulmonary hypertension, unspecified (HCC) 01/28/2023   OSA (obstructive sleep apnea) 01/28/2023   Balance problems 01/31/2022   Dysphagia 01/31/2022   Healthcare maintenance 08/22/2021   History of non-ST elevation myocardial infarction (NSTEMI) 06/25/2017   Presence of drug coated stent in SVG-RCA 06/25/2017   Postherpetic neuralgia 01/02/2017   Tremor 05/15/2016   Irritability and anger 05/15/2016   Seborrheic keratoses 06/16/2014  Special screening for malignant neoplasm of prostate 06/14/2014   Retinal hemorrhage 07/23/2010   Impotence of organic origin 01/16/2010   Essential hypertension 04/18/2009   Hyperlipidemia with target low density lipoprotein (LDL) cholesterol less than 70 mg/dL 16/09/9603   CAD (coronary artery disease) 01/28/2007   HERNIA, HIATAL, NONCONGENITAL 01/28/2007    ONSET DATE: 05/20/2023 (referral)  REFERRING DIAG: G60.9 (ICD-10-CM) - Idiopathic peripheral neuropathy  THERAPY DIAG:  Unsteadiness on feet  Other abnormalities of gait and mobility  Muscle weakness  (generalized)  Rationale for Evaluation and Treatment: Rehabilitation  SUBJECTIVE:                                                                                                                                                                                             SUBJECTIVE STATEMENT: "Skip"  Pt presents without AD. Denies acute changes since last visit. No falls. Has been working on his HEP, was challenging but he did it.   Pt accompanied by:  Wife, [name] (in lobby)  PERTINENT HISTORY: Open heart surgery in 1990, multiple heart caths from 2018-this year. Mild MI in July 2018.   PAIN:  Are you having pain? No  PRECAUTIONS: Fall  RED FLAGS: None   WEIGHT BEARING RESTRICTIONS: No  FALLS: Has patient fallen in last 6 months? Yes. Number of falls >5  LIVING ENVIRONMENT: Lives with: lives with their spouse Lives in: House/apartment Stairs: Yes: External: 4 in front and 3 in back steps; Bilateral rails in front and none in back Has following equipment at home: Single point cane, Walker - 2 wheeled, and [employer] - 4 wheeled  PLOF: Needs assistance with homemaking  PATIENT GOALS: "I am not sure"   OBJECTIVE:   DIAGNOSTIC FINDINGS: CT of Head and cervical spine on 05/17/23  IMPRESSION: CT of the head: No acute intracranial abnormality noted.   Atrophic changes are seen.   CT of the cervical spine: Multilevel degenerative change without acute abnormality.  COGNITION: Overall cognitive status: Impaired   SENSATION: Pt w/bilateral peripheral neuropathy in feet    EDEMA: Pt reports no, wife states yes in BLEs   POSTURE: rounded shoulders, forward head, and increased thoracic kyphosis   LOWER EXTREMITY MMT:  Tested in seated position   MMT Right Eval Left Eval  Hip flexion 5 5  Hip extension    Hip abduction 5 5  Hip adduction 5 5  Hip internal rotation    Hip external rotation    Knee flexion 5 5  Knee extension 5 5  Ankle dorsiflexion 5 5  Ankle  plantarflexion    Ankle inversion    Ankle eversion    (  Blank rows = not tested)  VITALS  There were no vitals filed for this visit.   TODAY'S TREATMENT:    Gait Training  Gait pattern: step through pattern, wide BOS, poor foot clearance- Right, and poor foot clearance- Left Distance walked: >400' outside on grass and on sidewalk  Assistive device utilized: Walker - 4 wheeled Level of assistance: Modified independence and SBA Comments: Trialed use of rollator outside on unlevel surfaces, as this is where pt has greatest balance challenge. No instability noted but pt reported feeling fatigued in his legs. Min cues to maintain close distance to rollator when on grass, as pt pushing it ahead like a lawn mower. Pt reported 7/10 fatigue following activity.   NMR  In // bars for improved BLE strength, vestibular input and ankle strategy:  On rocker board in A/P direction  Standing no UE support, x2 minutes. Mod BLE tremors noted initially that dissipated w/practice  Standing w/EO and horizontal head turns, x2 minutes. Min A due to anterior LOB, but pt demonstrates good righting reactions  Standing w/EC, 4x30s holds. Min A due to anterior LOB Ball tosses w/second therapist, x4 minutes. CGA throughout. 2 instances of min A required due to anterior LOB following low ball toss On rocker board in L/R direction Standing w/no UE support, x2 minutes w/CGA. Increased LOB to R side > L side  Standing w/EC, 3x30sw/CGA. Pt frequently bracing hands against rail to stabilize. Pt w/R lateral lean preference this date  Step ups to 6" step while holding 12# KB in contralateral arm, x10 reps per side. Increased difficulty performing on RLE > LLE.                                                                                                            PATIENT EDUCATION: Education details: continue HEP, encouragement to use rollator  Person educated: Patient Education method: Explanation, Demonstration, and  Handouts Education comprehension: verbalized understanding and returned demonstration  HOME EXERCISE PROGRAM: Access Code: [email or web address] URL: [email or web address] Date: 07/01/2023 Prepared by: Alethia Berthold Shirley Decamp  Exercises - Standing with feet together and eyes closed with head turns   - 1 x daily - 7 x weekly - 30-45 second  hold - Tandem Stance with Eyes Closed in Corner  - 1 x daily - 7 x weekly - 30-45 second hold  GOALS: Goals reviewed with patient? Yes  SHORT TERM GOALS: Target date: 07/13/2023   Pt will be independent with initial HEP for improved endurance, balance, transfers and gait.  Baseline: not established on eval  Goal status: INITIAL  2.  MCTSIB to be assessed and STG/LTG written  Baseline: 120/120 (7/23) Goal status: DC due to high baseline score   3.  Pt will ambulate greater than or equal to 395 feet on with LRAD mod I for improved cardiovascular endurance and BLE strength.   Baseline: 345' no AD and SBA  Goal status: REVISED  4.  Pt will improve gait velocity to at least 2.4 ft/s w/LRAD for improved gait efficiency and endurance  Baseline: 2.18  ft/s w/no AD Goal status: INITIAL  5.  Pt will trial various assistive devices on both indoor and outdoor surfaces to determine least restrictive option that promotes independence and reduces fall risk.  Baseline: Pt has cane, RW and rollator at home; trialed rollator on 7/23 Goal status: IN PROGRESS   LONG TERM GOALS: Target date: 08/10/2023    Pt will be independent with final HEP for improved strength, balance, transfers and gait.  Baseline:  Goal status: INITIAL  2.  Pt will improve FGA to 20 for decreased fall risk   Baseline: 15/30 (7/31) Goal status: NEW   3.  Pt will ambulate greater than or equal to 450 feet on with LRAD mod I for improved cardiovascular endurance and BLE strength.    Baseline: 345' no AD and SBA  Goal status: REVISED   4.  Pt will improve gait velocity to  at least 2.7 ft/s w/LRAD for improved gait efficiency and independence  Baseline: 2.18 ft/s w/no AD Goal status: INITIAL  5.  Pt will trial various bracing options on BLEs to determine safest option for reduced fall risk  Baseline: Trialed on 7/26, pt does not like  Goal status: MET   ASSESSMENT:  CLINICAL IMPRESSION: Emphasis of skilled PT session on gait training w/rollator on unlevel surfaces, ankle strategy and vestibular input. Pt demonstrates improved step clearance and stability w/use of rollator on unlevel surfaces. Encourage pt to use this at home in his yard for reduced fall risk and improved confidence with gait. Pt continues to be most challenged by [facility] activities and demonstrated anterolateral LOB to R side. However, pt does demonstrate good righting reactions. Continue POC.    OBJECTIVE IMPAIRMENTS: Abnormal gait, cardiopulmonary status limiting activity, decreased activity tolerance, decreased balance, decreased cognition, decreased endurance, decreased knowledge of condition, decreased knowledge of use of DME, difficulty walking, decreased safety awareness, and impaired sensation  ACTIVITY LIMITATIONS: carrying, lifting, bending, standing, squatting, transfers, locomotion level, and caring for others  PARTICIPATION LIMITATIONS: meal prep, cleaning, laundry, medication management, driving, shopping, community activity, and yard work  PERSONAL FACTORS: Fitness, Past/current experiences, Transportation, and 1 comorbidity: bilateral peripheral neuropathy  are also affecting patient's functional outcome.   REHAB POTENTIAL: Good  CLINICAL DECISION MAKING: Stable/uncomplicated  EVALUATION COMPLEXITY: Low  PLAN:  PT FREQUENCY: 1-2x/week  PT DURATION: 8 weeks (POC written for 12 weeks)  PLANNED INTERVENTIONS: Therapeutic exercises, Therapeutic activity, Neuromuscular re-education, Balance training, Gait training, Patient/Family education, Self Care, Joint mobilization, Stair  training, Vestibular training, Canalith repositioning, Orthotic/Fit training, DME instructions, Aquatic Therapy, Manual therapy, and Re-evaluation  PLAN FOR NEXT SESSION: Monitor SpO2. Establish HEP based on results of OM.  Practice rollator on unlevel surfaces. Scifit for endurance. Blaze pods, high level balance tasks, EC tasks, balance beam    Jill Alexanders Qamar Rosman, PT, DPT 07/03/2023, 12:15 PM

## 2023-07-06 ENCOUNTER — Ambulatory Visit: Payer: No Typology Code available for payment source | Admitting: Physical Therapy

## 2023-07-06 ENCOUNTER — Encounter: Payer: Self-pay | Admitting: Physical Therapy

## 2023-07-06 VITALS — BP 140/66 | HR 53

## 2023-07-06 DIAGNOSIS — R2681 Unsteadiness on feet: Secondary | ICD-10-CM | POA: Diagnosis not present

## 2023-07-06 DIAGNOSIS — R296 Repeated falls: Secondary | ICD-10-CM

## 2023-07-06 DIAGNOSIS — R2689 Other abnormalities of gait and mobility: Secondary | ICD-10-CM

## 2023-07-06 DIAGNOSIS — M6281 Muscle weakness (generalized): Secondary | ICD-10-CM

## 2023-07-06 NOTE — Therapy (Signed)
OUTPATIENT PHYSICAL THERAPY NEURO TREATMENT   Patient Name: Joshua [name]. MRN: [id number] DOB:Apr 21, 1941, 82 y.o., male Today's Date: 07/06/2023   PCP: Nestor Ramp, MD REFERRING PROVIDER: Vladimir Faster, DO  END OF SESSION:  PT End of Session - 07/06/23 0840     Visit Number 6    Number of Visits 17    Date for PT Re-Evaluation 09/07/23    Authorization Type [employer] [place]    PT Start Time [phone]    PT Stop Time 0921    PT Time Calculation (min) 43 min    Equipment Utilized During Treatment Gait belt    Activity Tolerance Patient tolerated treatment well    Behavior During Therapy WFL for tasks assessed/performed              Past Medical History:  Diagnosis Date   Coronary artery disease involving native coronary artery of native heart with angina pectoris (HCC)    a. 1990 s/p CABG x 4 (RIMA->LAD, LIMA->LCX, VG->Diag, VG->RCA); b. 2011 s/p BMS to VG->RCA.; c. Unstable Angina 05/2017: Patent RIMA-LAD & LIMA-LCx, ostSVG-RCA 65% ISR & mSVG-RCA  99% - DES PCI to both // Myoview 05/2019:  EF 57, no ischemia or scar, Low Risk    GERD (gastroesophageal reflux disease)    History of echocardiogram    a. 01/2017 Echo: EF 55-60%, no rwma, mild MR, midlly dil LA, PASP .   History of hiatal hernia    Hyperlipidemia    Hypertension    Obesity    Past Surgical History:  Procedure Laterality Date   CATARACT EXTRACTION W/ INTRAOCULAR LENS  IMPLANT, BILATERAL Bilateral    CORONARY ANGIOPLASTY WITH STENT PLACEMENT  2011   "RCA"   CORONARY ARTERY BYPASS GRAFT  1990   CABG X4   CORONARY STENT INTERVENTION N/A 06/24/2017   Procedure: Coronary Stent Intervention;  Surgeon: Lyn Records, MD;  Location: [facility];  Service: Cardiovascular: Ostial 4.0 x 12 mm Onyx DES reducing 70% stenosis to less than 40%. Distal body 3.5 x 15 Onyx DES reducing 99% stenosis to less than 30%.   LEFT HEART CATH AND CORS/GRAFTS ANGIOGRAPHY N/A 06/24/2017   Procedure: Left Heart Cath  and Cors/Grafts Angiography;  Surgeon: Lyn Records, MD;  Location: [facility] [place] IN[facility]ASIVE CV LAB;  Service: Cardiovascular:  CTO native RCA, LAD & LCx (Graft Dependent). Patent LIMA-LCx & RIMA-LAD.  SVG-RCA: ost 65% ISR & mid 99% --> DES PCI to both   LEFT HEART CATH AND CORS/GRAFTS ANGIOGRAPHY N/A 01/04/2021   Procedure: LEFT HEART CATH AND CORS/GRAFTS ANGIOGRAPHY;  Surgeon: Lyn Records, MD;  Location: [facility];  Service: Cardiovascular;  Laterality: N/A;   RIGHT/LEFT HEART CATH AND CORONARY/GRAFT ANGIOGRAPHY N/A 09/25/2022   Procedure: RIGHT/LEFT HEART CATH AND CORONARY/GRAFT ANGIOGRAPHY;  Surgeon: Kathleene Hazel, MD;  Location: [facility];  Service: Cardiovascular;  Laterality: N/A;   Patient Active Problem List   Diagnosis Date Noted   Dyspnea on exertion 05/06/2023   Driving safety issue 63/12/6008   Pulmonary hypertension, unspecified (HCC) 01/28/2023   OSA (obstructive sleep apnea) 01/28/2023   Balance problems 01/31/2022   Dysphagia 01/31/2022   Healthcare maintenance 08/22/2021   History of non-ST elevation myocardial infarction (NSTEMI) 06/25/2017   Presence of drug coated stent in SVG-RCA 06/25/2017   Postherpetic neuralgia 01/02/2017   Tremor 05/15/2016   Irritability and anger 05/15/2016   Seborrheic keratoses 06/16/2014   Special screening for malignant neoplasm of prostate 06/14/2014  Retinal hemorrhage 07/23/2010   Impotence of organic origin 01/16/2010   Essential hypertension 04/18/2009   Hyperlipidemia with target low density lipoprotein (LDL) cholesterol less than 70 mg/dL 65/78/4696   CAD (coronary artery disease) 01/28/2007   HERNIA, HIATAL, NONCONGENITAL 01/28/2007    ONSET DATE: 05/20/2023 (referral)  REFERRING DIAG: G60.9 (ICD-10-CM) - Idiopathic peripheral neuropathy  THERAPY DIAG:  Unsteadiness on feet  Other abnormalities of gait and mobility  Muscle weakness (generalized)  Repeated falls  Balance problems  Muscle  weakness  Rationale for Evaluation and Treatment: Rehabilitation  SUBJECTIVE:                                                                                                                                                                                             SUBJECTIVE STATEMENT: "Skip"  Pt arrives to session without AD. Patient states EC and tandem balance are the two hardest challenges.   Pt accompanied by:  Wife, [name] (in lobby)  PERTINENT HISTORY: Open heart surgery in 1990, multiple heart caths from 2018-this year. Mild MI in July 2018.   PAIN:  Are you having pain? No  PRECAUTIONS: Fall  RED FLAGS: None   WEIGHT BEARING RESTRICTIONS: No  FALLS: Has patient fallen in last 6 months? Yes. Number of falls >5  LIVING ENVIRONMENT: Lives with: lives with their spouse Lives in: House/apartment Stairs: Yes: External: 4 in front and 3 in back steps; Bilateral rails in front and none in back Has following equipment at home: Single point cane, Walker - 2 wheeled, and [employer] - 4 wheeled  PLOF: Needs assistance with homemaking  PATIENT GOALS: "I am not sure"   OBJECTIVE:   DIAGNOSTIC FINDINGS: CT of Head and cervical spine on 05/17/23  IMPRESSION: CT of the head: No acute intracranial abnormality noted.   Atrophic changes are seen.   CT of the cervical spine: Multilevel degenerative change without acute abnormality.  COGNITION: Overall cognitive status: Impaired   SENSATION: Pt w/bilateral peripheral neuropathy in feet    EDEMA: Pt reports no, wife states yes in BLEs   POSTURE: rounded shoulders, forward head, and increased thoracic kyphosis   LOWER EXTREMITY MMT:  Tested in seated position   MMT Right Eval Left Eval  Hip flexion 5 5  Hip extension    Hip abduction 5 5  Hip adduction 5 5  Hip internal rotation    Hip external rotation    Knee flexion 5 5  Knee extension 5 5  Ankle dorsiflexion 5 5  Ankle plantarflexion    Ankle inversion     Ankle eversion    (Blank rows = not tested)  VITALS  Vitals:   07/06/23 0847  BP: (!) 140/66  Pulse: (!) 53    TODAY'S TREATMENT:     NMR:   Dynamic Warmup 2 x 10 feet (CGA-SBA) High knees Glute kicks Toe walking Heel walking Tin man Over unders   Blaze Pods/Balance beam work:    5 Blaze pods on random setting for improved modified single leg stance, balance on compliant surface, and lateral stepping strategy.  Performed on 1 minute intervals with 30 rest periods with EC balance feet together.  Pt requires CGA guarding. Round 1:  side stepping on balance beam with blaze pods on floor  setup.  5 hits. Round 2:  side stepping on balance beam with blaze pods on floor   setup.  7 hits. Round 3:  side stepping on balance beam with blaze pods on floor   setup.  3 hits. Notable errors/deficits:  intermitent adjustment for ankle stability   5 Blaze pods on random setting for modified single leg stance, balance on compliant surface, narrow base of support, and lateral stepping strategy.  Performed on 1 minute intervals with 30 rest periods.  Pt requires CGA guarding. Round 1:  tandem stepping on balance beam with UE support on bar as needed setup.  2 hits. Round 2:  tandem stepping on balance beam with UE support on bar as needed setup.  setup.  2 hits. Round 3:  tandem stepping on balance beam with UE support on bar as needed setup.  setup.  4 hits. Notable errors/deficits: required intermittent use of UE on ballet bar to prevent LOB  5 Blaze pods on random setting for modified single leg stance, balance on compliant surface, narrow base of support, and lateral stepping strategy.  Performed on 1 minute intervals with 30 rest periods.  Pt requires CGA guarding. Round 1:  tandem stepping on balance beam with UE support on bar as needed setup, blaze pods set closer together.  5 hits. Round 2:  tandem stepping on balance beam with UE support on bar as needed setup, blaze pods set closer  together.  setup.  7 hits. Round 3:  tandem stepping on balance beam with UE support on bar as needed setup, blaze pods set closer together.  setup.  8 hits. Notable errors/deficits: required intermittent use of UE on ballet bar to prevent LOB; reports fatigue on final rep                                                                                      PATIENT EDUCATION: Education details: continue HEP, encouragement to use rollator continued  Person educated: Patient Education method: Medical illustrator Education comprehension: verbalized understanding and returned demonstration  HOME EXERCISE PROGRAM: Access Code: [email or web address] URL: [email or web address] Date: 07/01/2023 Prepared by: Alethia Berthold Plaster  Exercises - Standing with feet together and eyes closed with head turns   - 1 x daily - 7 x weekly - 30-45 second  hold - Tandem Stance with Eyes Closed in Corner  - 1 x daily - 7 x weekly - 30-45 second hold  GOALS: Goals reviewed with patient? Yes  SHORT TERM GOALS: Target date: 07/13/2023   Pt  will be independent with initial HEP for improved endurance, balance, transfers and gait.  Baseline: not established on eval  Goal status: INITIAL  2.  MCTSIB to be assessed and STG/LTG written  Baseline: 120/120 (7/23) Goal status: DC due to high baseline score   3.  Pt will ambulate greater than or equal to 395 feet on with LRAD mod I for improved cardiovascular endurance and BLE strength.   Baseline: 345' no AD and SBA  Goal status: REVISED  4.  Pt will improve gait velocity to at least 2.4 ft/s w/LRAD for improved gait efficiency and endurance  Baseline: 2.18 ft/s w/no AD Goal status: INITIAL  5.  Pt will trial various assistive devices on both indoor and outdoor surfaces to determine least restrictive option that promotes independence and reduces fall risk.  Baseline: Pt has cane, RW and rollator at home; trialed rollator on 7/23 Goal status: IN  PROGRESS   LONG TERM GOALS: Target date: 08/10/2023    Pt will be independent with final HEP for improved strength, balance, transfers and gait.  Baseline:  Goal status: INITIAL  2.  Pt will improve FGA to 20 for decreased fall risk   Baseline: 15/30 (7/31) Goal status: NEW   3.  Pt will ambulate greater than or equal to 450 feet on with LRAD mod I for improved cardiovascular endurance and BLE strength.    Baseline: 345' no AD and SBA  Goal status: REVISED   4.  Pt will improve gait velocity to at least 2.7 ft/s w/LRAD for improved gait efficiency and independence  Baseline: 2.18 ft/s w/no AD Goal status: INITIAL  5.  Pt will trial various bracing options on BLEs to determine safest option for reduced fall risk  Baseline: Trialed on 7/26, pt does not like  Goal status: MET   ASSESSMENT:  CLINICAL IMPRESSION: Emphasis of skilled PT session on stepping strategy, balance on compliant surface, dynamic warmup for mobility and modified single leg stance, and narrow base of support. Patient tolerated session well; increased difiiculty noted with tandem stepping versus lateral due to increase demand for NBOS. Patient demonstrated need for 4x seated rest breaks due to fatigue but denied SOB but eager to participate throughout. Continue POC.   OBJECTIVE IMPAIRMENTS: Abnormal gait, cardiopulmonary status limiting activity, decreased activity tolerance, decreased balance, decreased cognition, decreased endurance, decreased knowledge of condition, decreased knowledge of use of DME, difficulty walking, decreased safety awareness, and impaired sensation  ACTIVITY LIMITATIONS: carrying, lifting, bending, standing, squatting, transfers, locomotion level, and caring for others  PARTICIPATION LIMITATIONS: meal prep, cleaning, laundry, medication management, driving, shopping, community activity, and yard work  PERSONAL FACTORS: Fitness, Past/current experiences, Transportation, and 1  comorbidity: bilateral peripheral neuropathy  are also affecting patient's functional outcome.   REHAB POTENTIAL: Good  CLINICAL DECISION MAKING: Stable/uncomplicated  EVALUATION COMPLEXITY: Low  PLAN:  PT FREQUENCY: 1-2x/week  PT DURATION: 8 weeks (POC written for 12 weeks)  PLANNED INTERVENTIONS: Therapeutic exercises, Therapeutic activity, Neuromuscular re-education, Balance training, Gait training, Patient/Family education, Self Care, Joint mobilization, Stair training, Vestibular training, Canalith repositioning, Orthotic/Fit training, DME instructions, Aquatic Therapy, Manual therapy, and Re-evaluation  PLAN FOR NEXT SESSION: Monitor SpO2. Establish HEP based on results of OM.  Practice rollator on unlevel surfaces. Scifit for endurance. Blaze pods, high level balance tasks, EC tasks, balance beam    Carmelia Bake, PT, DPT 07/06/2023, 9:40 AM

## 2023-07-08 ENCOUNTER — Ambulatory Visit: Payer: No Typology Code available for payment source | Admitting: Physical Therapy

## 2023-07-08 DIAGNOSIS — R2689 Other abnormalities of gait and mobility: Secondary | ICD-10-CM

## 2023-07-08 DIAGNOSIS — R2681 Unsteadiness on feet: Secondary | ICD-10-CM

## 2023-07-08 DIAGNOSIS — M6281 Muscle weakness (generalized): Secondary | ICD-10-CM

## 2023-07-08 NOTE — Therapy (Signed)
OUTPATIENT PHYSICAL THERAPY NEURO TREATMENT   Patient Name: Joshua [name]. MRN: [id number] DOB:01-05-1941, 82 y.o., male Today's Date: 07/08/2023   PCP: Nestor Ramp, MD REFERRING PROVIDER: Vladimir Faster, DO  END OF SESSION:  PT End of Session - 07/08/23 1234     Visit Number 7    Number of Visits 17    Date for PT Re-Evaluation 09/07/23    Authorization Type [employer] [place]    PT Start Time 1232    PT Stop Time 1315    PT Time Calculation (min) 43 min    Equipment Utilized During Treatment Gait belt    Activity Tolerance Patient tolerated treatment well    Behavior During Therapy WFL for tasks assessed/performed              Past Medical History:  Diagnosis Date   Coronary artery disease involving native coronary artery of native heart with angina pectoris (HCC)    a. 1990 s/p CABG x 4 (RIMA->LAD, LIMA->LCX, VG->Diag, VG->RCA); b. 2011 s/p BMS to VG->RCA.; c. Unstable Angina 05/2017: Patent RIMA-LAD & LIMA-LCx, ostSVG-RCA 65% ISR & mSVG-RCA  99% - DES PCI to both // Myoview 05/2019:  EF 57, no ischemia or scar, Low Risk    GERD (gastroesophageal reflux disease)    History of echocardiogram    a. 01/2017 Echo: EF 55-60%, no rwma, mild MR, midlly dil LA, PASP .   History of hiatal hernia    Hyperlipidemia    Hypertension    Obesity    Past Surgical History:  Procedure Laterality Date   CATARACT EXTRACTION W/ INTRAOCULAR LENS  IMPLANT, BILATERAL Bilateral    CORONARY ANGIOPLASTY WITH STENT PLACEMENT  2011   "RCA"   CORONARY ARTERY BYPASS GRAFT  1990   CABG X4   CORONARY STENT INTERVENTION N/A 06/24/2017   Procedure: Coronary Stent Intervention;  Surgeon: Lyn Records, MD;  Location: [facility];  Service: Cardiovascular: Ostial 4.0 x 12 mm Onyx DES reducing 70% stenosis to less than 40%. Distal body 3.5 x 15 Onyx DES reducing 99% stenosis to less than 30%.   LEFT HEART CATH AND CORS/GRAFTS ANGIOGRAPHY N/A 06/24/2017   Procedure: Left Heart Cath  and Cors/Grafts Angiography;  Surgeon: Lyn Records, MD;  Location: [facility] INVASIVE CV LAB;  Service: Cardiovascular:  CTO native RCA, LAD & LCx (Graft Dependent). Patent LIMA-LCx & RIMA-LAD.  SVG-RCA: ost 65% ISR & mid 99% --> DES PCI to both   LEFT HEART CATH AND CORS/GRAFTS ANGIOGRAPHY N/A 01/04/2021   Procedure: LEFT HEART CATH AND CORS/GRAFTS ANGIOGRAPHY;  Surgeon: Lyn Records, MD;  Location: [facility];  Service: Cardiovascular;  Laterality: N/A;   RIGHT/LEFT HEART CATH AND CORONARY/GRAFT ANGIOGRAPHY N/A 09/25/2022   Procedure: RIGHT/LEFT HEART CATH AND CORONARY/GRAFT ANGIOGRAPHY;  Surgeon: Kathleene Hazel, MD;  Location: [facility];  Service: Cardiovascular;  Laterality: N/A;   Patient Active Problem List   Diagnosis Date Noted   Dyspnea on exertion 05/06/2023   Driving safety issue 08/65/7846   Pulmonary hypertension, unspecified (HCC) 01/28/2023   OSA (obstructive sleep apnea) 01/28/2023   Balance problems 01/31/2022   Dysphagia 01/31/2022   Healthcare maintenance 08/22/2021   History of non-ST elevation myocardial infarction (NSTEMI) 06/25/2017   Presence of drug coated stent in SVG-RCA 06/25/2017   Postherpetic neuralgia 01/02/2017   Tremor 05/15/2016   Irritability and anger 05/15/2016   Seborrheic keratoses 06/16/2014   Special screening for malignant neoplasm of prostate 06/14/2014  Retinal hemorrhage 07/23/2010   Impotence of organic origin 01/16/2010   Essential hypertension 04/18/2009   Hyperlipidemia with target low density lipoprotein (LDL) cholesterol less than 70 mg/dL 16/09/9603   CAD (coronary artery disease) 01/28/2007   HERNIA, HIATAL, NONCONGENITAL 01/28/2007    ONSET DATE: 05/20/2023 (referral)  REFERRING DIAG: G60.9 (ICD-10-CM) - Idiopathic peripheral neuropathy  THERAPY DIAG:  Unsteadiness on feet  Other abnormalities of gait and mobility  Muscle weakness (generalized)  Rationale for Evaluation and Treatment:  Rehabilitation  SUBJECTIVE:                                                                                                                                                                                             SUBJECTIVE STATEMENT: "Skip"  Pt arrives to session without AD. Pt reports he tried mowing the other day, legs got tired and needed to sit down due to heaviness of mower. No falls   Pt accompanied by:  Wife, [name] (in lobby)  PERTINENT HISTORY: Open heart surgery in 1990, multiple heart caths from 2018-this year. Mild MI in July 2018.   PAIN:  Are you having pain? No  PRECAUTIONS: Fall  RED FLAGS: None   WEIGHT BEARING RESTRICTIONS: No  FALLS: Has patient fallen in last 6 months? Yes. Number of falls >5  LIVING ENVIRONMENT: Lives with: lives with their spouse Lives in: House/apartment Stairs: Yes: External: 4 in front and 3 in back steps; Bilateral rails in front and none in back Has following equipment at home: Single point cane, Walker - 2 wheeled, and [employer] - 4 wheeled  PLOF: Needs assistance with homemaking  PATIENT GOALS: "I am not sure"   OBJECTIVE:   DIAGNOSTIC FINDINGS: CT of Head and cervical spine on 05/17/23  IMPRESSION: CT of the head: No acute intracranial abnormality noted.   Atrophic changes are seen.   CT of the cervical spine: Multilevel degenerative change without acute abnormality.  COGNITION: Overall cognitive status: Impaired   SENSATION: Pt w/bilateral peripheral neuropathy in feet    EDEMA: Pt reports no, wife states yes in BLEs   POSTURE: rounded shoulders, forward head, and increased thoracic kyphosis   LOWER EXTREMITY MMT:  Tested in seated position   MMT Right Eval Left Eval  Hip flexion 5 5  Hip extension    Hip abduction 5 5  Hip adduction 5 5  Hip internal rotation    Hip external rotation    Knee flexion 5 5  Knee extension 5 5  Ankle dorsiflexion 5 5  Ankle plantarflexion    Ankle inversion     Ankle eversion    (Blank rows =  not tested)  VITALS  There were no vitals filed for this visit.   TODAY'S TREATMENT:     Ther Ex Gait pattern: step through pattern, decreased stride length, decreased hip/knee flexion- Right, decreased hip/knee flexion- Left, decreased ankle dorsiflexion- Right, decreased ankle dorsiflexion- Left, decreased trunk rotation, and wide BOS Distance walked: 230' Assistive device utilized: None Level of assistance: SBA Comments: Ambulated while holding 15# surge for improved endurance and imitation of performing yard work at home. Pt reported 7/10 fatigue at end of activity   Gait pattern: step through pattern, decreased arm swing- Right, decreased arm swing- Left, decreased stride length, decreased hip/knee flexion- Right, decreased hip/knee flexion- Left, decreased ankle dorsiflexion- Right, decreased ankle dorsiflexion- Left, decreased trunk rotation, trunk flexed, and wide BOS Distance walked: 115' x2  Assistive device utilized: None Level of assistance: SBA and CGA Comments: With second therapist providing resistance at pelvis via blue resistance band for improved reactive balance strategies. On first lap, provided constant posterior resistance. On second lap, random posterolateral perturbations applied. Increased difficulty stepping to R side vs L side but pt demonstrated adequate stepping strategies bilaterally.   At rebounder w/2kg ball for improved anticipatory balance strategies, BLE strength and balance w/narrow BOS. CGA throughout: Tandem stance ball throw/catches, x10 per side On airex Tandem stance ball throw/catches, x10 reps per side  Romberg stance, x10 reps. Increased postural sway to L side  In // bars for improved single leg stability and BLE strength, alt step up w/contralateral march on blue side of Bosu x8 reps per side w/light UE support. Pt regressed to modified tandem stance on bosu (marching leg placed posteriorly to stance leg) and  attempted to stand unsupported for 10-15s each rep. Pt able to stabilize well w/no LOB, CGA throughout.   RPE of 9/10 following session                                                                   PATIENT EDUCATION: Education details: continue HEP  Person educated: Patient Education method: Medical illustrator Education comprehension: verbalized understanding and returned demonstration  HOME EXERCISE PROGRAM: Access Code: [email or web address] URL: [email or web address] Date: 07/01/2023 Prepared by: Alethia Berthold Julianne Chamberlin  Exercises - Standing with feet together and eyes closed with head turns   - 1 x daily - 7 x weekly - 30-45 second  hold - Tandem Stance with Eyes Closed in Corner  - 1 x daily - 7 x weekly - 30-45 second hold  GOALS: Goals reviewed with patient? Yes  SHORT TERM GOALS: Target date: 07/13/2023   Pt will be independent with initial HEP for improved endurance, balance, transfers and gait.  Baseline: not established on eval  Goal status: INITIAL  2.  MCTSIB to be assessed and STG/LTG written  Baseline: 120/120 (7/23) Goal status: DC due to high baseline score   3.  Pt will ambulate greater than or equal to 395 feet on with LRAD mod I for improved cardiovascular endurance and BLE strength.   Baseline: 345' no AD and SBA  Goal status: REVISED  4.  Pt will improve gait velocity to at least 2.4 ft/s w/LRAD for improved gait efficiency and endurance  Baseline: 2.18 ft/s w/no AD Goal status: INITIAL  5.  Pt will trial various assistive  devices on both indoor and outdoor surfaces to determine least restrictive option that promotes independence and reduces fall risk.  Baseline: Pt has cane, RW and rollator at home; trialed rollator on 7/23 Goal status: IN PROGRESS   LONG TERM GOALS: Target date: 08/10/2023    Pt will be independent with final HEP for improved strength, balance, transfers and gait.  Baseline:  Goal status: INITIAL  2.  Pt  will improve FGA to 20 for decreased fall risk   Baseline: 15/30 (7/31) Goal status: NEW   3.  Pt will ambulate greater than or equal to 450 feet on with LRAD mod I for improved cardiovascular endurance and BLE strength.    Baseline: 345' no AD and SBA  Goal status: REVISED   4.  Pt will improve gait velocity to at least 2.7 ft/s w/LRAD for improved gait efficiency and independence  Baseline: 2.18 ft/s w/no AD Goal status: INITIAL  5.  Pt will trial various bracing options on BLEs to determine safest option for reduced fall risk  Baseline: Trialed on 7/26, pt does not like  Goal status: MET   ASSESSMENT:  CLINICAL IMPRESSION: Emphasis of skilled PT session on reactive and anticipatory balance strategies, stability w/narrow BOS and endurance. Pt reported being fatigued while mowing his yard due to difficulty maneuvering his mower, so worked on resisted and weighted gait training at beginning of session, which pt tolerated well. Progressed to reactive balance strategies, w/delayed reaction on R side noted but still WNL. Pt demonstrates improved stability w/tandem and romberg stance this date, w/no LOB noted. Pt will continue to benefit from skilled PT services for improved endurance and functional capacity for ADLs and IADLs. Continue POC.   OBJECTIVE IMPAIRMENTS: Abnormal gait, cardiopulmonary status limiting activity, decreased activity tolerance, decreased balance, decreased cognition, decreased endurance, decreased knowledge of condition, decreased knowledge of use of DME, difficulty walking, decreased safety awareness, and impaired sensation  ACTIVITY LIMITATIONS: carrying, lifting, bending, standing, squatting, transfers, locomotion level, and caring for others  PARTICIPATION LIMITATIONS: meal prep, cleaning, laundry, medication management, driving, shopping, community activity, and yard work  PERSONAL FACTORS: Fitness, Past/current experiences, Transportation, and 1  comorbidity: bilateral peripheral neuropathy  are also affecting patient's functional outcome.   REHAB POTENTIAL: Good  CLINICAL DECISION MAKING: Stable/uncomplicated  EVALUATION COMPLEXITY: Low  PLAN:  PT FREQUENCY: 1-2x/week  PT DURATION: 8 weeks (POC written for 12 weeks)  PLANNED INTERVENTIONS: Therapeutic exercises, Therapeutic activity, Neuromuscular re-education, Balance training, Gait training, Patient/Family education, Self Care, Joint mobilization, Stair training, Vestibular training, Canalith repositioning, Orthotic/Fit training, DME instructions, Aquatic Therapy, Manual therapy, and Re-evaluation  PLAN FOR NEXT SESSION: Monitor SpO2. Establish HEP based on results of OM.  Practice rollator on unlevel surfaces. Scifit for endurance. Blaze pods, high level balance tasks, EC tasks, balance beam (hurdles), turning    Ulyses Panico E Devanta Daniel, PT, DPT 07/08/2023, 1:18 PM

## 2023-07-14 ENCOUNTER — Other Ambulatory Visit: Payer: Self-pay | Admitting: Cardiovascular Disease

## 2023-07-14 ENCOUNTER — Ambulatory Visit: Payer: No Typology Code available for payment source | Admitting: Physical Therapy

## 2023-07-14 ENCOUNTER — Encounter: Payer: Self-pay | Admitting: Physical Therapy

## 2023-07-14 VITALS — BP 138/53 | HR 53 | Resp 96

## 2023-07-14 DIAGNOSIS — M6281 Muscle weakness (generalized): Secondary | ICD-10-CM

## 2023-07-14 DIAGNOSIS — R2681 Unsteadiness on feet: Secondary | ICD-10-CM | POA: Diagnosis not present

## 2023-07-14 DIAGNOSIS — R2689 Other abnormalities of gait and mobility: Secondary | ICD-10-CM

## 2023-07-14 DIAGNOSIS — R296 Repeated falls: Secondary | ICD-10-CM

## 2023-07-14 NOTE — Therapy (Signed)
OUTPATIENT PHYSICAL THERAPY NEURO TREATMENT   Patient Name: Joshua [name]. MRN: [id number] DOB:[birth year], 82 y.o., male Today's Date: 07/14/2023   PCP: Nestor Ramp, MD REFERRING PROVIDER: Vladimir Faster, DO  END OF SESSION:  PT End of Session - 07/14/23 1024     Visit Number 8    Number of Visits 17    Date for PT Re-Evaluation 09/07/23    Authorization Type [employer] [place]    PT Start Time 1021    PT Stop Time 1100    PT Time Calculation (min) 39 min    Equipment Utilized During Treatment Gait belt    Activity Tolerance Patient tolerated treatment well    Behavior During Therapy WFL for tasks assessed/performed              Past Medical History:  Diagnosis Date   Coronary artery disease involving native coronary artery of native heart with angina pectoris (HCC)    a. 1990 s/p CABG x 4 (RIMA->LAD, LIMA->LCX, VG->Diag, VG->RCA); b. 2011 s/p BMS to VG->RCA.; c. Unstable Angina 05/2017: Patent RIMA-LAD & LIMA-LCx, ostSVG-RCA 65% ISR & mSVG-RCA  99% - DES PCI to both // Myoview 05/2019:  EF 57, no ischemia or scar, Low Risk    GERD (gastroesophageal reflux disease)    History of echocardiogram    a. 01/2017 Echo: EF 55-60%, no rwma, mild MR, midlly dil LA, PASP .   History of hiatal hernia    Hyperlipidemia    Hypertension    Obesity    Past Surgical History:  Procedure Laterality Date   CATARACT EXTRACTION W/ INTRAOCULAR LENS  IMPLANT, BILATERAL Bilateral    CORONARY ANGIOPLASTY WITH STENT PLACEMENT  2011   "RCA"   CORONARY ARTERY BYPASS GRAFT  1990   CABG X4   CORONARY STENT INTERVENTION N/A 06/24/2017   Procedure: Coronary Stent Intervention;  Surgeon: Lyn Records, MD;  Location: [facility];  Service: Cardiovascular: Ostial 4.0 x 12 mm Onyx DES reducing 70% stenosis to less than 40%. Distal body 3.5 x 15 Onyx DES reducing 99% stenosis to less than 30%.   LEFT HEART CATH AND CORS/GRAFTS ANGIOGRAPHY N/A 06/24/2017   Procedure: Left Heart Cath  and Cors/Grafts Angiography;  Surgeon: Lyn Records, MD;  Location: [facility] INVASIVE CV LAB;  Service: Cardiovascular:  CTO native RCA, LAD & LCx (Graft Dependent). Patent LIMA-LCx & RIMA-LAD.  SVG-RCA: ost 65% ISR & mid 99% --> DES PCI to both   LEFT HEART CATH AND CORS/GRAFTS ANGIOGRAPHY N/A 01/04/2021   Procedure: LEFT HEART CATH AND CORS/GRAFTS ANGIOGRAPHY;  Surgeon: Lyn Records, MD;  Location: [facility];  Service: Cardiovascular;  Laterality: N/A;   RIGHT/LEFT HEART CATH AND CORONARY/GRAFT ANGIOGRAPHY N/A 09/25/2022   Procedure: RIGHT/LEFT HEART CATH AND CORONARY/GRAFT ANGIOGRAPHY;  Surgeon: Kathleene Hazel, MD;  Location: [facility];  Service: Cardiovascular;  Laterality: N/A;   Patient Active Problem List   Diagnosis Date Noted   Dyspnea on exertion 05/06/2023   Driving safety issue 78/29/5621   Pulmonary hypertension, unspecified (HCC) 01/28/2023   OSA (obstructive sleep apnea) 01/28/2023   Balance problems 01/31/2022   Dysphagia 01/31/2022   Healthcare maintenance 08/22/2021   History of non-ST elevation myocardial infarction (NSTEMI) 06/25/2017   Presence of drug coated stent in SVG-RCA 06/25/2017   Postherpetic neuralgia 01/02/2017   Tremor 05/15/2016   Irritability and anger 05/15/2016   Seborrheic keratoses 06/16/2014   Special screening for malignant neoplasm of prostate 06/14/2014  Retinal hemorrhage 07/23/2010   Impotence of organic origin 01/16/2010   Essential hypertension 04/18/2009   Hyperlipidemia with target low density lipoprotein (LDL) cholesterol less than 70 mg/dL 14/78/2956   CAD (coronary artery disease) 01/28/2007   HERNIA, HIATAL, NONCONGENITAL 01/28/2007    ONSET DATE: 05/20/2023 (referral)  REFERRING DIAG: G60.9 (ICD-10-CM) - Idiopathic peripheral neuropathy  THERAPY DIAG:  Unsteadiness on feet  Other abnormalities of gait and mobility  Muscle weakness (generalized)  Repeated falls  Rationale for Evaluation and  Treatment: Rehabilitation  SUBJECTIVE:                                                                                                                                                                                             SUBJECTIVE STATEMENT: "Skip"  Pt reports that he went to the grocery store and felt like his legs were so tired. Felt like the cart.   Pt accompanied by:  Wife, [name] (in lobby)  PERTINENT HISTORY: Open heart surgery in 1990, multiple heart caths from 2018-this year. Mild MI in July 2018.   PAIN:  Are you having pain? No  PRECAUTIONS: Fall  RED FLAGS: None   WEIGHT BEARING RESTRICTIONS: No  FALLS: Has patient fallen in last 6 months? Yes. Number of falls >5  LIVING ENVIRONMENT: Lives with: lives with their spouse Lives in: House/apartment Stairs: Yes: External: 4 in front and 3 in back steps; Bilateral rails in front and none in back Has following equipment at home: Single point cane, Walker - 2 wheeled, and [employer] - 4 wheeled  PLOF: Needs assistance with homemaking  PATIENT GOALS: "I am not sure"   OBJECTIVE:   DIAGNOSTIC FINDINGS: CT of Head and cervical spine on 05/17/23  IMPRESSION: CT of the head: No acute intracranial abnormality noted.   Atrophic changes are seen.   CT of the cervical spine: Multilevel degenerative change without acute abnormality.  COGNITION: Overall cognitive status: Impaired   SENSATION: Pt w/bilateral peripheral neuropathy in feet    EDEMA: Pt reports no, wife states yes in BLEs   POSTURE: rounded shoulders, forward head, and increased thoracic kyphosis   LOWER EXTREMITY MMT:  Tested in seated position   MMT Right Eval Left Eval  Hip flexion 5 5  Hip extension    Hip abduction 5 5  Hip adduction 5 5  Hip internal rotation    Hip external rotation    Knee flexion 5 5  Knee extension 5 5  Ankle dorsiflexion 5 5  Ankle plantarflexion    Ankle inversion    Ankle eversion    (Blank rows = not  tested)  VITALS  Vitals:   07/14/23 1028  BP: (!) 138/53  Pulse: (!) 53  Resp: (!) 96     TODAY'S TREATMENT:     Ther Ex SciFit for cardiovascular endurance 30 seconds on for max effort 30 seconds off for 5 min, 40 seconds on and 20 seconds to recover x 2 min,  at level 7 with "twin hill" setting, 1 min recovery  (RPE: 7/10)   GAIT: Gait pattern: decreased step length- Right, decreased step length- Left, decreased hip/knee flexion- Right, decreased hip/knee flexion- Left, decreased ankle dorsiflexion- Right, and decreased ankle dorsiflexion- Left Distance walked: 1 x 115 feet forward Assistive device utilized: None with 7lb ankle weight donned bilaterally Level of assistance: CGA  GAIT: Gait pattern: decreased step length- Right, decreased step length- Left, decreased hip/knee flexion- Right, decreased hip/knee flexion- Left, decreased ankle dorsiflexion- Right, and decreased ankle dorsiflexion- Left Distance walked: 1 x 115 feet backward Assistive device utilized: None with 7lb ankle weight donned bilaterally Level of assistance: CGA, minA-modA at end due to fatigue Comments: RPE 9/10, LOB requiring due to fatigue  GAIT: Gait pattern: side stepping Assistive device utilized: None with 7lb ankle weight donned bilaterally Level of assistance: CGA, minA at end due to fatigue Comments: lateral LOB   NMR: 6" hurdle stepovers 3 x 4 hurdles with stride stance and volley ball bounce to self (minA)               PATIENT EDUCATION: Education details: continue HEP  Person educated: Patient Education method: Medical illustrator Education comprehension: verbalized understanding and returned demonstration  HOME EXERCISE PROGRAM: Access Code: [email or web address] URL: [email or web address] Date: 07/01/2023 Prepared by: Alethia Berthold Plaster  Exercises - Standing with feet together and eyes closed with head turns   - 1 x daily - 7 x weekly - 30-45 second  hold - Tandem  Stance with Eyes Closed in Corner  - 1 x daily - 7 x weekly - 30-45 second hold  GOALS: Goals reviewed with patient? Yes  SHORT TERM GOALS: Target date: 07/13/2023   Pt will be independent with initial HEP for improved endurance, balance, transfers and gait.  Baseline: not established on eval  Goal status: INITIAL  2.  MCTSIB to be assessed and STG/LTG written  Baseline: 120/120 (7/23) Goal status: DC due to high baseline score   3.  Pt will ambulate greater than or equal to 395 feet on with LRAD mod I for improved cardiovascular endurance and BLE strength.   Baseline: 345' no AD and SBA  Goal status: REVISED  4.  Pt will improve gait velocity to at least 2.4 ft/s w/LRAD for improved gait efficiency and endurance  Baseline: 2.18 ft/s w/no AD Goal status: INITIAL  5.  Pt will trial various assistive devices on both indoor and outdoor surfaces to determine least restrictive option that promotes independence and reduces fall risk.  Baseline: Pt has cane, RW and rollator at home; trialed rollator on 7/23 Goal status: IN PROGRESS   LONG TERM GOALS: Target date: 08/10/2023    Pt will be independent with final HEP for improved strength, balance, transfers and gait.  Baseline:  Goal status: INITIAL  2.  Pt will improve FGA to 20 for decreased fall risk   Baseline: 15/30 (7/31) Goal status: NEW   3.  Pt will ambulate greater than or equal to 450 feet on with LRAD mod I for improved cardiovascular endurance and BLE strength.    Baseline: 345' no AD and SBA  Goal  status: REVISED   4.  Pt will improve gait velocity to at least 2.7 ft/s w/LRAD for improved gait efficiency and independence  Baseline: 2.18 ft/s w/no AD Goal status: INITIAL  5.  Pt will trial various bracing options on BLEs to determine safest option for reduced fall risk  Baseline: Trialed on 7/26, pt does not like  Goal status: MET   ASSESSMENT:  CLINICAL IMPRESSION: Emphasis of skilled PT  session on cardiovascular endurance, strength training, and balance reactions. The patient's balance regressed significantly with increased fatigue. Patient LOB occurred more frequently with fatigue rather than significant balance challenges; continues to demonstrate difficulty with limb clearance. Continue POC.   OBJECTIVE IMPAIRMENTS: Abnormal gait, cardiopulmonary status limiting activity, decreased activity tolerance, decreased balance, decreased cognition, decreased endurance, decreased knowledge of condition, decreased knowledge of use of DME, difficulty walking, decreased safety awareness, and impaired sensation  ACTIVITY LIMITATIONS: carrying, lifting, bending, standing, squatting, transfers, locomotion level, and caring for others  PARTICIPATION LIMITATIONS: meal prep, cleaning, laundry, medication management, driving, shopping, community activity, and yard work  PERSONAL FACTORS: Fitness, Past/current experiences, Transportation, and 1 comorbidity: bilateral peripheral neuropathy  are also affecting patient's functional outcome.   REHAB POTENTIAL: Good  CLINICAL DECISION MAKING: Stable/uncomplicated  EVALUATION COMPLEXITY: Low  PLAN:  PT FREQUENCY: 1-2x/week  PT DURATION: 8 weeks (POC written for 12 weeks)  PLANNED INTERVENTIONS: Therapeutic exercises, Therapeutic activity, Neuromuscular re-education, Balance training, Gait training, Patient/Family education, Self Care, Joint mobilization, Stair training, Vestibular training, Canalith repositioning, Orthotic/Fit training, DME instructions, Aquatic Therapy, Manual therapy, and Re-evaluation  PLAN FOR NEXT SESSION: Monitor SpO2. Establish HEP based on results of OM.  Practice rollator on unlevel surfaces. Scifit for endurance. Blaze pods, high level balance tasks, EC tasks, balance beam (hurdles), turning; assess STGs   Carmelia Bake, PT, DPT 07/14/2023, 5:09 PM

## 2023-07-17 ENCOUNTER — Ambulatory Visit: Payer: No Typology Code available for payment source | Admitting: Physical Therapy

## 2023-07-17 DIAGNOSIS — M6281 Muscle weakness (generalized): Secondary | ICD-10-CM

## 2023-07-17 DIAGNOSIS — R2689 Other abnormalities of gait and mobility: Secondary | ICD-10-CM

## 2023-07-17 DIAGNOSIS — R2681 Unsteadiness on feet: Secondary | ICD-10-CM | POA: Diagnosis not present

## 2023-07-17 NOTE — Therapy (Signed)
OUTPATIENT PHYSICAL THERAPY NEURO TREATMENT   Patient Name: Joshua [name]. MRN: [id number] DOB:14-Jun-1941, 82 y.o., male Today's Date: 07/17/2023   PCP: Nestor Ramp, MD REFERRING PROVIDER: Vladimir Faster, DO  END OF SESSION:  PT End of Session - 07/17/23 1019     Visit Number 9    Number of Visits 17    Date for PT Re-Evaluation 09/07/23    Authorization Type [employer] [place]    PT Start Time 1018    PT Stop Time 1057    PT Time Calculation (min) 39 min    Equipment Utilized During Treatment Gait belt    Activity Tolerance Patient tolerated treatment well    Behavior During Therapy WFL for tasks assessed/performed              Past Medical History:  Diagnosis Date   Coronary artery disease involving native coronary artery of native heart with angina pectoris (HCC)    a. 1990 s/p CABG x 4 (RIMA->LAD, LIMA->LCX, VG->Diag, VG->RCA); b. 2011 s/p BMS to VG->RCA.; c. Unstable Angina 05/2017: Patent RIMA-LAD & LIMA-LCx, ostSVG-RCA 65% ISR & mSVG-RCA  99% - DES PCI to both // Myoview 05/2019:  EF 57, no ischemia or scar, Low Risk    GERD (gastroesophageal reflux disease)    History of echocardiogram    a. 01/2017 Echo: EF 55-60%, no rwma, mild MR, midlly dil LA, PASP .   History of hiatal hernia    Hyperlipidemia    Hypertension    Obesity    Past Surgical History:  Procedure Laterality Date   CATARACT EXTRACTION W/ INTRAOCULAR LENS  IMPLANT, BILATERAL Bilateral    CORONARY ANGIOPLASTY WITH STENT PLACEMENT  2011   "RCA"   CORONARY ARTERY BYPASS GRAFT  1990   CABG X4   CORONARY STENT INTERVENTION N/A 06/24/2017   Procedure: Coronary Stent Intervention;  Surgeon: Lyn Records, MD;  Location: [facility];  Service: Cardiovascular: Ostial 4.0 x 12 mm Onyx DES reducing 70% stenosis to less than 40%. Distal body 3.5 x 15 Onyx DES reducing 99% stenosis to less than 30%.   LEFT HEART CATH AND CORS/GRAFTS ANGIOGRAPHY N/A 06/24/2017   Procedure: Left Heart Cath  and Cors/Grafts Angiography;  Surgeon: Lyn Records, MD;  Location: [facility];  Service: Cardiovascular:  CTO native RCA, LAD & LCx (Graft Dependent). Patent LIMA-LCx & RIMA-LAD.  SVG-RCA: ost 65% ISR & mid 99% --> DES PCI to both   LEFT HEART CATH AND CORS/GRAFTS ANGIOGRAPHY N/A 01/04/2021   Procedure: LEFT HEART CATH AND CORS/GRAFTS ANGIOGRAPHY;  Surgeon: Lyn Records, MD;  Location: [facility];  Service: Cardiovascular;  Laterality: N/A;   RIGHT/LEFT HEART CATH AND CORONARY/GRAFT ANGIOGRAPHY N/A 09/25/2022   Procedure: RIGHT/LEFT HEART CATH AND CORONARY/GRAFT ANGIOGRAPHY;  Surgeon: Kathleene Hazel, MD;  Location: [facility];  Service: Cardiovascular;  Laterality: N/A;   Patient Active Problem List   Diagnosis Date Noted   Dyspnea on exertion 05/06/2023   Driving safety issue 21/30/8657   Pulmonary hypertension, unspecified (HCC) 01/28/2023   OSA (obstructive sleep apnea) 01/28/2023   Balance problems 01/31/2022   Dysphagia 01/31/2022   Healthcare maintenance 08/22/2021   History of non-ST elevation myocardial infarction (NSTEMI) 06/25/2017   Presence of drug coated stent in SVG-RCA 06/25/2017   Postherpetic neuralgia 01/02/2017   Tremor 05/15/2016   Irritability and anger 05/15/2016   Seborrheic keratoses 06/16/2014   Special screening for malignant neoplasm of prostate 06/14/2014  Retinal hemorrhage 07/23/2010   Impotence of organic origin 01/16/2010   Essential hypertension 04/18/2009   Hyperlipidemia with target low density lipoprotein (LDL) cholesterol less than 70 mg/dL 84/13/2440   CAD (coronary artery disease) 01/28/2007   HERNIA, HIATAL, NONCONGENITAL 01/28/2007    ONSET DATE: 05/20/2023 (referral)  REFERRING DIAG: G60.9 (ICD-10-CM) - Idiopathic peripheral neuropathy  THERAPY DIAG:  Unsteadiness on feet  Other abnormalities of gait and mobility  Muscle weakness (generalized)  Rationale for Evaluation and Treatment:  Rehabilitation  SUBJECTIVE:                                                                                                                                                                                             SUBJECTIVE STATEMENT: "Skip"  Pt reports doing well. "Those weights last time were rough". Reports he feels as though he is walling better now. No falls or pain   Pt accompanied by:  Wife, [name] (in lobby)  PERTINENT HISTORY: Open heart surgery in 1990, multiple heart caths from 2018-this year. Mild MI in July 2018.   PAIN:  Are you having pain? No  PRECAUTIONS: Fall  RED FLAGS: None   WEIGHT BEARING RESTRICTIONS: No  FALLS: Has patient fallen in last 6 months? Yes. Number of falls >5  LIVING ENVIRONMENT: Lives with: lives with their spouse Lives in: House/apartment Stairs: Yes: External: 4 in front and 3 in back steps; Bilateral rails in front and none in back Has following equipment at home: Single point cane, Walker - 2 wheeled, and [employer] - 4 wheeled  PLOF: Needs assistance with homemaking  PATIENT GOALS: "I am not sure"   OBJECTIVE:   DIAGNOSTIC FINDINGS: CT of Head and cervical spine on 05/17/23  IMPRESSION: CT of the head: No acute intracranial abnormality noted.   Atrophic changes are seen.   CT of the cervical spine: Multilevel degenerative change without acute abnormality.  COGNITION: Overall cognitive status: Impaired   SENSATION: Pt w/bilateral peripheral neuropathy in feet    EDEMA: Pt reports no, wife states yes in BLEs   POSTURE: rounded shoulders, forward head, and increased thoracic kyphosis   LOWER EXTREMITY MMT:  Tested in seated position   MMT Right Eval Left Eval  Hip flexion 5 5  Hip extension    Hip abduction 5 5  Hip adduction 5 5  Hip internal rotation    Hip external rotation    Knee flexion 5 5  Knee extension 5 5  Ankle dorsiflexion 5 5  Ankle plantarflexion    Ankle inversion    Ankle eversion     (Blank rows = not tested)  VITALS  There were no vitals filed for this visit.    TODAY'S TREATMENT:     Ther Act STG Assessment   [facility] PT Assessment - 07/17/23 1024       Ambulation/Gait   Gait velocity 32.8' over 13.85s = 2.36 ft/s no AD              Gait pattern: step through pattern, decreased arm swing- Right, decreased arm swing- Left, decreased stride length, decreased hip/knee flexion- Right, decreased hip/knee flexion- Left, decreased ankle dorsiflexion- Right, decreased ankle dorsiflexion- Left, shuffling, poor foot clearance- Right, and poor foot clearance- Left Distance walked: 115' loop completed 5x + 95' = 670'  Assistive device utilized: None Level of assistance: Modified independence Comments: Pt's first lap the fastest, but maintained ~64s average on remainder of test. Decreased step clearance noted w/fatigue. Pt lost balance anteriorly while attempting to sit following test, requiring min A to prevent fall.  Pre-test vitals: RPE 0/10, dyspnea of 0/10, SpO2 94% on room air, HR 53 bpm   Post-test vitals: RPE 8/10, dyspnea of 8/10, SpO2 of 93%, HR 74 bpm   After 5 minutes, SpO2 at 98% and HR 58 bpm    NMR: With John in [place], pt pulled stedy backwards around clinic x115' for improved anterior chain strength and endurance. CGA initially that regressed to min A as pt fatigued due to posterior lean and decreased step clearance. RPE of 9/10 following activity.  Discussed POC moving forward and pt in agreement to DC on 8/27 due to improved endurance and balance               PATIENT EDUCATION: Education details: Goal outcomes, POC moving forward   Person educated: Patient Education method: Medical illustrator Education comprehension: verbalized understanding and returned demonstration  HOME EXERCISE PROGRAM: Access Code: [email or web address] URL: [email or web address] Date: 07/01/2023 Prepared by: Alethia Berthold Mardi Cannady  Exercises - Standing  with feet together and eyes closed with head turns   - 1 x daily - 7 x weekly - 30-45 second  hold - Tandem Stance with Eyes Closed in Corner  - 1 x daily - 7 x weekly - 30-45 second hold  GOALS: Goals reviewed with patient? Yes  SHORT TERM GOALS: Target date: 07/13/2023   Pt will be independent with initial HEP for improved endurance, balance, transfers and gait.  Baseline: not established on eval  Goal status: MET  2.  MCTSIB to be assessed and STG/LTG written  Baseline: 120/120 (7/23) Goal status: DC due to high baseline score   3.  Pt will ambulate greater than or equal to 395 feet on with LRAD mod I for improved cardiovascular endurance and BLE strength.   Baseline: 345' no AD and SBA; 670' w/no AD mod I Goal status: MET  4.  Pt will improve gait velocity to at least 2.4 ft/s w/LRAD for improved gait efficiency and endurance  Baseline: 2.18 ft/s w/no AD; 2.36 ft/s no AD Goal status: PARTIALLY MET   5.  Pt will trial various assistive devices on both indoor and outdoor surfaces to determine least restrictive option that promotes independence and reduces fall risk.  Baseline: Pt has cane, RW and rollator at home; trialed rollator on 7/23 Goal status: MET   LONG TERM GOALS: Target date: 08/10/2023    Pt will be independent with final HEP for improved strength, balance, transfers and gait.  Baseline:  Goal status: INITIAL  2.  Pt will improve FGA to 20 for decreased fall risk  Baseline: 15/30 (7/31) Goal status: NEW   3.  Pt will ambulate greater than or equal to 715 feet on with LRAD mod I for improved cardiovascular endurance and BLE strength.    Baseline: 345' no AD and SBA; 670' w/no AD mod I (8/16)   Goal status: REVISED   4.  Pt will improve gait velocity to at least 2.7 ft/s w/LRAD for improved gait efficiency and independence  Baseline: 2.18 ft/s w/no AD Goal status: INITIAL  5.  Pt will trial various bracing options on BLEs to determine  safest option for reduced fall risk  Baseline: Trialed on 7/26, pt does not like  Goal status: MET   ASSESSMENT:  CLINICAL IMPRESSION: Emphasis of skilled PT session on STG assessment, endurance and anterior chain strength. Pt has met 3 of 4 STGs and partially met his 4th goal. Pt has significantly improved his distance on , indicative of improved endurance, BLE strength and balance. Pt narrowly missed his gait speed goal by 0.04 ft/s, so marking goal as partially met. Pt has trialed AFOs in clinic but does not require them at this time. Pt continues to be limited by poor cardiovascular endurance but has significantly improved since evaluation and continues to make gains. Continue POC.   OBJECTIVE IMPAIRMENTS: Abnormal gait, cardiopulmonary status limiting activity, decreased activity tolerance, decreased balance, decreased cognition, decreased endurance, decreased knowledge of condition, decreased knowledge of use of DME, difficulty walking, decreased safety awareness, and impaired sensation  ACTIVITY LIMITATIONS: carrying, lifting, bending, standing, squatting, transfers, locomotion level, and caring for others  PARTICIPATION LIMITATIONS: meal prep, cleaning, laundry, medication management, driving, shopping, community activity, and yard work  PERSONAL FACTORS: Fitness, Past/current experiences, Transportation, and 1 comorbidity: bilateral peripheral neuropathy  are also affecting patient's functional outcome.   REHAB POTENTIAL: Good  CLINICAL DECISION MAKING: Stable/uncomplicated  EVALUATION COMPLEXITY: Low  PLAN:  PT FREQUENCY: 1-2x/week  PT DURATION: 8 weeks (POC written for 12 weeks)  PLANNED INTERVENTIONS: Therapeutic exercises, Therapeutic activity, Neuromuscular re-education, Balance training, Gait training, Patient/Family education, Self Care, Joint mobilization, Stair training, Vestibular training, Canalith repositioning, Orthotic/Fit training, DME instructions, Aquatic  Therapy, Manual therapy, and Re-evaluation  PLAN FOR NEXT SESSION: 10th visit PN. Monitor SpO2. Establish HEP based on results of OM.  Practice rollator on unlevel surfaces. Scifit for endurance. Blaze pods, high level balance tasks, EC tasks, balance beam (hurdles), turning; clock yourself, gait w/band, psoas marches    Trayson Stitely E Waylan Busta, PT, DPT 07/17/2023, 11:01 AM

## 2023-07-22 ENCOUNTER — Ambulatory Visit: Payer: No Typology Code available for payment source | Admitting: Physical Therapy

## 2023-07-22 DIAGNOSIS — R2681 Unsteadiness on feet: Secondary | ICD-10-CM

## 2023-07-22 DIAGNOSIS — R2689 Other abnormalities of gait and mobility: Secondary | ICD-10-CM

## 2023-07-22 DIAGNOSIS — M6281 Muscle weakness (generalized): Secondary | ICD-10-CM

## 2023-07-22 NOTE — Therapy (Signed)
OUTPATIENT PHYSICAL THERAPY NEURO TREATMENT- 10TH VISIT PROGRESS NOTE    Patient Name: Joshua [name]. MRN: [id number] DOB:11-Jan-1941, 82 y.o., male Today's Date: 07/22/2023   PCP: Nestor Ramp, MD REFERRING PROVIDER: Vladimir Faster, DO  Physical Therapy Progress Note   Dates of Reporting Period:06/15/23 - 07/22/23  See Note below for Objective Data and Assessment of Progress/Goals.    END OF SESSION:  PT End of Session - 07/22/23 0849     Visit Number 10    Number of Visits 17    Date for PT Re-Evaluation 09/07/23    Authorization Type [employer] [place]    PT Start Time [id number]    PT Stop Time 0929    PT Time Calculation (min) 42 min    Equipment Utilized During Treatment Gait belt    Activity Tolerance Patient tolerated treatment well    Behavior During Therapy WFL for tasks assessed/performed               Past Medical History:  Diagnosis Date   Coronary artery disease involving native coronary artery of native heart with angina pectoris (HCC)    a. 1990 s/p CABG x 4 (RIMA->LAD, LIMA->LCX, VG->Diag, VG->RCA); b. 2011 s/p BMS to VG->RCA.; c. Unstable Angina 05/2017: Patent RIMA-LAD & LIMA-LCx, ostSVG-RCA 65% ISR & mSVG-RCA  99% - DES PCI to both // Myoview 05/2019:  EF 57, no ischemia or scar, Low Risk    GERD (gastroesophageal reflux disease)    History of echocardiogram    a. 01/2017 Echo: EF 55-60%, no rwma, mild MR, midlly dil LA, PASP .   History of hiatal hernia    Hyperlipidemia    Hypertension    Obesity    Past Surgical History:  Procedure Laterality Date   CATARACT EXTRACTION W/ INTRAOCULAR LENS  IMPLANT, BILATERAL Bilateral    CORONARY ANGIOPLASTY WITH STENT PLACEMENT  2011   "RCA"   CORONARY ARTERY BYPASS GRAFT  1990   CABG X4   CORONARY STENT INTERVENTION N/A 06/24/2017   Procedure: Coronary Stent Intervention;  Surgeon: Lyn Records, MD;  Location: [facility] INVASIVE CV LAB;  Service: Cardiovascular: Ostial 4.0 x 12 mm Onyx DES reducing 70%  stenosis to less than 40%. Distal body 3.5 x 15 Onyx DES reducing 99% stenosis to less than 30%.   LEFT HEART CATH AND CORS/GRAFTS ANGIOGRAPHY N/A 06/24/2017   Procedure: Left Heart Cath and Cors/Grafts Angiography;  Surgeon: Lyn Records, MD;  Location: [facility] CV LAB;  Service: Cardiovascular:  CTO native RCA, LAD & LCx (Graft Dependent). Patent LIMA-LCx & RIMA-LAD.  SVG-RCA: ost 65% ISR & mid 99% --> DES PCI to both   LEFT HEART CATH AND CORS/GRAFTS ANGIOGRAPHY N/A 01/04/2021   Procedure: LEFT HEART CATH AND CORS/GRAFTS ANGIOGRAPHY;  Surgeon: Lyn Records, MD;  Location: [facility] INVASIVE CV LAB;  Service: Cardiovascular;  Laterality: N/A;   RIGHT/LEFT HEART CATH AND CORONARY/GRAFT ANGIOGRAPHY N/A 09/25/2022   Procedure: RIGHT/LEFT HEART CATH AND CORONARY/GRAFT ANGIOGRAPHY;  Surgeon: Kathleene Hazel, MD;  Location: [facility] INVASIVE CV [facility];  Service: Cardiovascular;  Laterality: N/A;   Patient Active Problem List   Diagnosis Date Noted   Dyspnea on exertion 05/06/2023   Driving safety issue 11/91/4782   Pulmonary hypertension, unspecified (HCC) 01/28/2023   OSA (obstructive sleep apnea) 01/28/2023   Balance problems 01/31/2022   Dysphagia 01/31/2022   Healthcare maintenance 08/22/2021   History of non-ST elevation myocardial infarction (NSTEMI) 06/25/2017   Presence of drug coated stent in  SVG-RCA 06/25/2017   Postherpetic neuralgia 01/02/2017   Tremor 05/15/2016   Irritability and anger 05/15/2016   Seborrheic keratoses 06/16/2014   Special screening for malignant neoplasm of prostate 06/14/2014   Retinal hemorrhage 07/23/2010   Impotence of organic origin 01/16/2010   Essential hypertension 04/18/2009   Hyperlipidemia with target low density lipoprotein (LDL) cholesterol less than 70 mg/dL 56/38/7564   CAD (coronary artery disease) 01/28/2007   HERNIA, HIATAL, NONCONGENITAL 01/28/2007    ONSET DATE: 05/20/2023 (referral)  REFERRING DIAG: G60.9 (ICD-10-CM) - Idiopathic  peripheral neuropathy  THERAPY DIAG:  Unsteadiness on feet  Other abnormalities of gait and mobility  Muscle weakness (generalized)  Rationale for Evaluation and Treatment: Rehabilitation  SUBJECTIVE:                                                                                                                                                                                             SUBJECTIVE STATEMENT: "Skip"  Pt reports doing well. Feels as though he is walking better, "I am walking faster and not as wobbly". Having some neck pain today, states it comes back every so often.   Pt accompanied by:  Wife, [name] (in lobby)  PERTINENT HISTORY: Open heart surgery in 1990, multiple heart caths from 2018-this year. Mild MI in July 2018.   PAIN:  Are you having pain? Yes: NPRS scale: 7/10 Pain location: Neck Pain description: achy  PRECAUTIONS: Fall  RED FLAGS: None   WEIGHT BEARING RESTRICTIONS: No  FALLS: Has patient fallen in last 6 months? Yes. Number of falls >5  LIVING ENVIRONMENT: Lives with: lives with their spouse Lives in: House/apartment Stairs: Yes: External: 4 in front and 3 in back steps; Bilateral rails in front and none in back Has following equipment at home: Single point cane, Walker - 2 wheeled, and [employer] - 4 wheeled  PLOF: Needs assistance with homemaking  PATIENT GOALS: "I am not sure"   OBJECTIVE:   DIAGNOSTIC FINDINGS: CT of Head and cervical spine on 05/17/23  IMPRESSION: CT of the head: No acute intracranial abnormality noted.   Atrophic changes are seen.   CT of the cervical spine: Multilevel degenerative change without acute abnormality.  COGNITION: Overall cognitive status: Impaired   SENSATION: Pt w/bilateral peripheral neuropathy in feet    EDEMA: Pt reports no, wife states yes in BLEs   POSTURE: rounded shoulders, forward head, and increased thoracic kyphosis   LOWER EXTREMITY MMT:  Tested in seated position   MMT  Right Eval Left Eval  Hip flexion 5 5  Hip extension    Hip abduction 5 5  Hip adduction 5 5  Hip internal rotation    Hip external rotation    Knee flexion 5 5  Knee extension 5 5  Ankle dorsiflexion 5 5  Ankle plantarflexion    Ankle inversion    Ankle eversion    (Blank rows = not tested)  VITALS  There were no vitals filed for this visit.    TODAY'S TREATMENT:     Ther Ex  SciFit multi-peaks level 9 for 8 minutes using BUE/BLEs for neural priming for reciprocal movement, dynamic cardiovascular warmup and increased amplitude of stepping. RPE of 7/10 following activity.   Ther Act  LTG Assessment   [facility] PT Assessment - 07/22/23 0905       Functional Gait  Assessment   Gait assessed  Yes    Gait Level Surface Walks 20 ft, slow speed, abnormal gait pattern, evidence for imbalance or deviates 10-15 in outside of the 12 in walkway width. Requires more than 7 sec to ambulate 20 ft.   8.06s   Change in Gait Speed Able to change speed, demonstrates mild gait deviations, deviates 6-10 in outside of the 12 in walkway width, or no gait deviations, unable to achieve a major change in velocity, or uses a change in velocity, or uses an assistive device.    Gait with Horizontal Head Turns Performs head turns smoothly with no change in gait. Deviates no more than 6 in outside 12 in walkway width    Gait with Vertical Head Turns Performs head turns with no change in gait. Deviates no more than 6 in outside 12 in walkway width.    Gait and Pivot Turn Pivot turns safely within 3 sec and stops quickly with no loss of balance.    Step Over Obstacle Is able to step over one shoe box (4.5 in total height) but must slow down and adjust steps to clear box safely. May require verbal cueing.   Able to clear two boxes but slows down   Gait with Narrow Base of Support Ambulates 7-9 steps.    Gait with Eyes Closed Walks 20 ft, slow speed, abnormal gait pattern, evidence for imbalance, deviates 10-15 in  outside 12 in walkway width. Requires more than 9 sec to ambulate 20 ft.   12.88s   Ambulating Backwards Walks 20 ft, slow speed, abnormal gait pattern, evidence for imbalance, deviates 10-15 in outside 12 in walkway width.   22.87s   Steps Alternating feet, must use rail.    Total Score 19    FGA comment: Medium fall risk            NMR  Clock yourself for improved LE coordination, step clearance, endurance and cog dual-tasking:  Simple clock for 2 minutes at 40 SPM, SBA  Brain games clock (calling out months, stepping on the correlating number) for 1.5 minutes at 30 SPM, pt challenged by this. SBA throughout.              PATIENT EDUCATION: Education details: Goal outcomes, plan to DC next session, importance of continuing exercising and walking after DC  Person educated: Patient Education method: Medical illustrator Education comprehension: verbalized understanding and returned demonstration  HOME EXERCISE PROGRAM: Access Code: [email or web address] URL: [email or web address] Date: 07/01/2023 Prepared by: Alethia Berthold Kamille Toomey  Exercises - Standing with feet together and eyes closed with head turns   - 1 x daily - 7 x weekly - 30-45 second  hold - Tandem Stance with Eyes Closed in Corner  - 1 x daily - 7 x weekly -  30-45 second hold  GOALS: Goals reviewed with patient? Yes  SHORT TERM GOALS: Target date: 07/13/2023   Pt will be independent with initial HEP for improved endurance, balance, transfers and gait.  Baseline: not established on eval  Goal status: MET  2.  MCTSIB to be assessed and STG/LTG written  Baseline: 120/120 (7/23) Goal status: DC due to high baseline score   3.  Pt will ambulate greater than or equal to 395 feet on with LRAD mod I for improved cardiovascular endurance and BLE strength.   Baseline: 345' no AD and SBA; 670' w/no AD mod I Goal status: MET  4.  Pt will improve gait velocity to at least 2.4 ft/s w/LRAD for improved gait  efficiency and endurance  Baseline: 2.18 ft/s w/no AD; 2.36 ft/s no AD Goal status: PARTIALLY MET   5.  Pt will trial various assistive devices on both indoor and outdoor surfaces to determine least restrictive option that promotes independence and reduces fall risk.  Baseline: Pt has cane, RW and rollator at home; trialed rollator on 7/23 Goal status: MET   LONG TERM GOALS: Target date: 08/10/2023    Pt will be independent with final HEP for improved strength, balance, transfers and gait.  Baseline:  Goal status: INITIAL  2.  Pt will improve FGA to 20 for decreased fall risk   Baseline: 15/30 (7/31); 19/30 (8/21)  Goal status: PARTIALLY MET    3.  Pt will ambulate greater than or equal to 715 feet on with LRAD mod I for improved cardiovascular endurance and BLE strength.    Baseline: 345' no AD and SBA; 670' w/no AD mod I (8/16)   Goal status: REVISED   4.  Pt will improve gait velocity to at least 2.7 ft/s w/LRAD for improved gait efficiency and independence  Baseline: 2.18 ft/s w/no AD Goal status: INITIAL  5.  Pt will trial various bracing options on BLEs to determine safest option for reduced fall risk  Baseline: Trialed on 7/26, pt does not like  Goal status: MET   ASSESSMENT:  CLINICAL IMPRESSION: Emphasis of skilled PT session on starting LTG assessment, POC discussion and cog dual-tasking. Pt has improved his score on FGA to a 19/30, indicative of medium fall risk. Pt lost most points due to slow speed of movement rather than instability, demonstrating ability to walk tandem for 9 steps without LOB today. Pt continues to be limited by decreased cardiovascular endurance but has improved his gait speed and demonstrates hip-width BOS rather than wide BOS from evaluation. Pt reports he feels as though he is walking more steadily now and is in agreement to DC next session. Continue POC.   OBJECTIVE IMPAIRMENTS: Abnormal gait, cardiopulmonary status limiting  activity, decreased activity tolerance, decreased balance, decreased cognition, decreased endurance, decreased knowledge of condition, decreased knowledge of use of DME, difficulty walking, decreased safety awareness, and impaired sensation  ACTIVITY LIMITATIONS: carrying, lifting, bending, standing, squatting, transfers, locomotion level, and caring for others  PARTICIPATION LIMITATIONS: meal prep, cleaning, laundry, medication management, driving, shopping, community activity, and yard work  PERSONAL FACTORS: Fitness, Past/current experiences, Transportation, and 1 comorbidity: bilateral peripheral neuropathy  are also affecting patient's functional outcome.   REHAB POTENTIAL: Good  CLINICAL DECISION MAKING: Stable/uncomplicated  EVALUATION COMPLEXITY: Low  PLAN:  PT FREQUENCY: 1-2x/week  PT DURATION: 8 weeks (POC written for 12 weeks)  PLANNED INTERVENTIONS: Therapeutic exercises, Therapeutic activity, Neuromuscular re-education, Balance training, Gait training, Patient/Family education, Self Care, Joint mobilization, Stair training,  Vestibular training, Canalith repositioning, Orthotic/Fit training, DME instructions, Aquatic Therapy, Manual therapy, and Re-evaluation  PLAN FOR NEXT SESSION: Goals and DC.    Gertude Benito E Srinivas Lippman, PT, DPT 07/22/2023, 9:30 AM

## 2023-07-23 NOTE — Progress Notes (Signed)
Assessment/Plan:   1.  Gait instability  -Peripheral neuropathy certainly may play a role here.  EMG also confirmed evidence of peripheral neuropathy.  -his most recently fall wasn't related to balance but rather something he should not have been doing (standing on one foot while rubbing something on the leg and he slipped and fell).    -discussed walker at all times.  Long discussion about safety  -DaTscan negative.  2.  Lingual tremor  -He has lingual tremor, which has been there at least since 2017.  Lingual tremor can present with essential tremor, but he has just a little bit of evidence of that.    3.  B12 deficiency  -Patient's B12 when last checked in June, 2024 was 312.  Patient now on supplement, 1000 mcg daily.    Subjective:   Joshua [name]. was seen today in the movement disorders clinic for follow-up testing results.  DaTscan completed in June, 2024 was normal, as expected.  EMG done in June, 2024, demonstrated a sensory axonal peripheral neuropathy, also as expected.  Lab work done for that was fairly unremarkable, with the exception of a mildly low B12 and we told him to start a supplement.  He has started physical therapy.  Unfortunately, prior to starting that therapy he was in the emergency room with a follow-up.  Those notes are reviewed.  Notes indicate that he had his leg up rubbing stuff on the leg and it slipped into the toilet and he slipped and couldn't get his foot out of the toilet and he ultimately fell backward and hit his head on the sink.  CT head was negative.  He did require staples in the scalp.  He doesn't use an ambulatory assistive device.     ALLERGIES:   Allergies  Allergen Reactions   Ace Inhibitors Cough   Atorvastatin Other (See Comments)    Muscle pain   Rosuvastatin Other (See Comments)    Muscle pain   Plavix [Clopidogrel Bisulfate] Itching and Rash   Sulfa Antibiotics Itching and Rash   Sulfonamide Derivatives Itching and Rash     CURRENT MEDICATIONS:  Current Meds  Medication Sig   ALPRAZolam (XANAX) 0.5 MG tablet TAKE 1 TABLET BY MOUTH AT  BEDTIME AS NEEDED FOR RESTLESS  LEGS   aspirin EC 81 MG EC tablet Take 1 tablet (81 mg total) by mouth daily.   atenolol-chlorthalidone (TENORETIC) 100-25 MG tablet Take 1 tablet by mouth daily.   citalopram (CELEXA) 20 MG tablet Take 1 tablet (20 mg total) by mouth daily.   esomeprazole (NEXIUM) 20 MG capsule Take 1 capsule (20 mg total) by mouth daily.   ezetimibe (ZETIA) 10 MG tablet Take 1 tablet (10 mg total) by mouth daily.   FARXIGA 10 MG TABS tablet Take 1 tablet (10 mg total) by mouth daily before breakfast.   furosemide (LASIX) 20 MG tablet Take 1 tablet (20 mg total) by mouth every other day.   gabapentin (NEURONTIN) 400 MG capsule Take one by mouth five times a day as directed   isosorbide mononitrate (IMDUR) 120 MG 24 hr tablet Take 1 tablet (120 mg total) by mouth daily.   nitroGLYCERIN (NITROSTAT) 0.4 MG SL tablet Place 1 tablet (0.4 mg total) under the tongue every 5 (five) minutes as needed for chest pain.   potassium chloride (KLOR-CON) 10 MEQ tablet Take 1 tablet (10 mEq total) by mouth every other day.   ranolazine (RANEXA) 500 MG 12 hr tablet Take  1 tablet (500 mg total) by mouth 2 (two) times daily.   rosuvastatin (CRESTOR) 20 MG tablet TAKE 1 TABLET BY MOUTH EVERY DAY     Objective:   VITALS:   Vitals:   07/27/23 0840  BP: 122/72  Pulse: (!) 55  SpO2: 96%  Weight: 232 lb 6.4 oz (105.4 kg)  Height: 5\' 8"  (1.727 m)     GEN:  The patient appears stated age and is in NAD. HEENT:  Normocephalic, atraumatic.  The mucous membranes are moist. The superficial temporal arteries are without ropiness or tenderness.  There is tongue tremor.   CV:  brady.  regular Lungs:  CTAB Neck/HEME:  There are no carotid bruits bilaterally.  Neurological examination:  Orientation: The patient is alert and oriented x3.  Cranial nerves: There is good facial  symmetry. Extraocular muscles are intact. The visual fields are full to confrontational testing. The speech is fluent and clear. Soft palate rises symmetrically and there is no tongue deviation. Hearing is intact to conversational tone. Sensation: Sensation is intact to light and pinprick throughout (facial, trunk, extremities). Vibration is decreased distally. There is no extinction with double simultaneous stimulation. There is no sensory dermatomal level identified. Motor: Strength is 5/5 in the bilateral upper and lower extremities.   Shoulder shrug is equal and symmetric.  There is no pronator drift. Deep tendon reflexes: Deep tendon reflexes are 2-/4 at the bilateral biceps, triceps, brachioradialis, patella and achilles. Plantar responses are downgoing bilaterally.  Movement examination: Tone: There is nl tone in the bilateral upper extremities.  The tone in the lower extremities is nl.  Abnormal movements: there is constant tongue tremor.  Tongue within mouth Coordination:  There is no decremation with RAM's, with any form of RAMS, including alternating supination and pronation of the forearm, hand opening and closing, finger taps, heel taps and toe taps.  Gait and Station: The patient pushes off to arise.  He drags his heels  He does not have much armswing on either side.  Exam is similar to last visit I have reviewed and interpreted the following labs independently   Chemistry      Component Value Date/Time   NA 136 05/17/2023 0325   NA 136 02/09/2023 1100   K 3.9 05/17/2023 0325   CL 99 05/17/2023 0325   CO2 28 05/17/2023 0325   BUN 18 05/17/2023 0325   BUN 17 02/09/2023 1100   CREATININE 1.06 05/17/2023 0325   CREATININE 0.87 05/14/2016 1234      Component Value Date/Time   CALCIUM 8.6 (L) 05/17/2023 0325   ALKPHOS 41 05/17/2023 0325   AST 31 05/17/2023 0325   ALT 16 05/17/2023 0325   BILITOT 0.4 05/17/2023 0325   BILITOT 0.4 08/21/2021 0947      Lab Results  Component  Value Date   TSH 3.057 01/04/2021   Lab Results  Component Value Date   WBC 7.7 05/17/2023   HGB 12.3 (L) 05/17/2023   HCT 38.4 (L) 05/17/2023   MCV 83.8 05/17/2023   PLT 175 05/17/2023   Lab Results  Component Value Date   VITAMINB12 244 07/16/2016     Total time spent on today's visit was 30 minutes, including both face-to-face time and nonface-to-face time.  Time included that spent on review of records (prior notes available to me/labs/imaging if pertinent), discussing treatment and goals, answering patient's questions and coordinating care.  Cc:  Nestor Ramp, MD

## 2023-07-27 ENCOUNTER — Encounter: Payer: Self-pay | Admitting: Neurology

## 2023-07-27 ENCOUNTER — Ambulatory Visit (INDEPENDENT_AMBULATORY_CARE_PROVIDER_SITE_OTHER): Payer: No Typology Code available for payment source | Admitting: Neurology

## 2023-07-27 VITALS — BP 122/72 | HR 55 | Ht 68.0 in | Wt 232.4 lb

## 2023-07-27 DIAGNOSIS — G609 Hereditary and idiopathic neuropathy, unspecified: Secondary | ICD-10-CM

## 2023-07-27 NOTE — Patient Instructions (Signed)
Use walker at all times!  The physicians and staff at [facility] are committed to providing excellent care. You may receive a survey requesting feedback about your experience at our office. We strive to receive "very good" responses to the survey questions. If you feel that your experience would prevent you from giving the office a "very good " response, please contact our office to try to remedy the situation. We may be reached at [phone]. Thank you for taking the time out of your busy day to complete the survey.

## 2023-07-28 ENCOUNTER — Ambulatory Visit: Payer: No Typology Code available for payment source | Admitting: Physical Therapy

## 2023-07-28 DIAGNOSIS — R2689 Other abnormalities of gait and mobility: Secondary | ICD-10-CM

## 2023-07-28 DIAGNOSIS — R2681 Unsteadiness on feet: Secondary | ICD-10-CM

## 2023-07-28 DIAGNOSIS — M6281 Muscle weakness (generalized): Secondary | ICD-10-CM

## 2023-07-28 NOTE — Therapy (Signed)
OUTPATIENT PHYSICAL THERAPY NEURO TREATMENT- DISCHARGE SUMMARY   Patient Name: Joshua [name]. MRN: [id number] DOB:1941-08-15, 82 y.o., male Today's Date: 07/28/2023   PCP: Joshua Ramp, MD REFERRING PROVIDER: Tat, Octaviano Batty, DO  PHYSICAL THERAPY DISCHARGE SUMMARY  Visits from Start of Care: 11  Current functional level related to goals / functional outcomes: Mod I w/use of rollator in community and no AD w/ short household distances    Remaining deficits: Medium fall risk, bilateral peripheral neuropathy, decreased activity tolerance    Education / Equipment: HEP, continued encouragement to use rollator in community and on unlevel surfaces    Patient agrees to discharge. Patient goals were partially met. Patient is being discharged due to being pleased with the current functional level.     END OF SESSION:  PT End of Session - 07/28/23 0851     Visit Number 11    Number of Visits 17    Date for PT Re-Evaluation 09/07/23    Authorization Type [employer] [place]    PT Start Time [phone]    PT Stop Time 0925   DC   PT Time Calculation (min) 36 min    Equipment Utilized During Treatment Gait belt    Activity Tolerance Patient tolerated treatment well    Behavior During Therapy WFL for tasks assessed/performed                Past Medical History:  Diagnosis Date   Coronary artery disease involving native coronary artery of native heart with angina pectoris (HCC)    a. 1990 s/p CABG x 4 (RIMA->LAD, LIMA->LCX, VG->Diag, VG->RCA); b. 2011 s/p BMS to VG->RCA.; c. Unstable Angina 05/2017: Patent RIMA-LAD & LIMA-LCx, ostSVG-RCA 65% ISR & mSVG-RCA  99% - DES PCI to both // Myoview 05/2019:  EF 57, no ischemia or scar, Low Risk    GERD (gastroesophageal reflux disease)    History of echocardiogram    a. 01/2017 Echo: EF 55-60%, no rwma, mild MR, midlly dil LA, PASP .   History of hiatal hernia    Hyperlipidemia    Hypertension    Obesity    Past Surgical  History:  Procedure Laterality Date   CATARACT EXTRACTION W/ INTRAOCULAR LENS  IMPLANT, BILATERAL Bilateral    CORONARY ANGIOPLASTY WITH STENT PLACEMENT  2011   "RCA"   CORONARY ARTERY BYPASS GRAFT  1990   CABG X4   CORONARY STENT INTERVENTION N/A 06/24/2017   Procedure: Coronary Stent Intervention;  Surgeon: Joshua Records, MD;  Location: [facility];  Service: Cardiovascular: Ostial 4.0 x 12 mm Onyx DES reducing 70% stenosis to less than 40%. Distal body 3.5 x 15 Onyx DES reducing 99% stenosis to less than 30%.   LEFT HEART CATH AND CORS/GRAFTS ANGIOGRAPHY N/A 06/24/2017   Procedure: Left Heart Cath and Cors/Grafts Angiography;  Surgeon: Joshua Records, MD;  Location: [facility] INVASIVE CV [facility];  Service: Cardiovascular:  CTO native RCA, LAD & LCx (Graft Dependent). Patent LIMA-LCx & RIMA-LAD.  SVG-RCA: ost 65% ISR & mid 99% --> DES PCI to both   LEFT HEART CATH AND CORS/GRAFTS ANGIOGRAPHY N/A 01/04/2021   Procedure: LEFT HEART CATH AND CORS/GRAFTS ANGIOGRAPHY;  Surgeon: Joshua Records, MD;  Location: [facility];  Service: Cardiovascular;  Laterality: N/A;   RIGHT/LEFT HEART CATH AND CORONARY/GRAFT ANGIOGRAPHY N/A 09/25/2022   Procedure: RIGHT/LEFT HEART CATH AND CORONARY/GRAFT ANGIOGRAPHY;  Surgeon: Joshua Hazel, MD;  Location: [facility];  Service: Cardiovascular;  Laterality:  N/A;   Patient Active Problem List   Diagnosis Date Noted   Dyspnea on exertion 05/06/2023   Driving safety issue 16/09/9603   Pulmonary hypertension, unspecified (HCC) 01/28/2023   OSA (obstructive sleep apnea) 01/28/2023   Balance problems 01/31/2022   Dysphagia 01/31/2022   Healthcare maintenance 08/22/2021   History of non-ST elevation myocardial infarction (NSTEMI) 06/25/2017   Presence of drug coated stent in SVG-RCA 06/25/2017   Postherpetic neuralgia 01/02/2017   Tremor 05/15/2016   Irritability and anger 05/15/2016   Seborrheic keratoses 06/16/2014   Special screening for  malignant neoplasm of prostate 06/14/2014   Retinal hemorrhage 07/23/2010   Impotence of organic origin 01/16/2010   Essential hypertension 04/18/2009   Hyperlipidemia with target low density lipoprotein (LDL) cholesterol less than 70 mg/dL 54/08/8118   CAD (coronary artery disease) 01/28/2007   HERNIA, HIATAL, NONCONGENITAL 01/28/2007    ONSET DATE: 05/20/2023 (referral)  REFERRING DIAG: G60.9 (ICD-10-CM) - Idiopathic peripheral neuropathy  THERAPY DIAG:  Unsteadiness on feet  Other abnormalities of gait and mobility  Muscle weakness (generalized)  Rationale for Evaluation and Treatment: Rehabilitation  SUBJECTIVE:                                                                                                                                                                                             SUBJECTIVE STATEMENT: "Joshua [name]"  Pt reports doing well. Saw Joshua [name] yesterday, was told to use a walker at all times. "I guess I will have to get over being embarrassed". No falls   Pt accompanied by:  Joshua [name] (in lobby)  PERTINENT HISTORY: Open heart surgery in 1990, multiple heart caths from 2018-this year. Mild MI in July 2018.   PAIN:  Are you having pain? No  PRECAUTIONS: Fall  RED FLAGS: None   WEIGHT BEARING RESTRICTIONS: No  FALLS: Has patient fallen in last 6 months? Yes. Number of falls >5  LIVING ENVIRONMENT: Lives with: lives with their spouse Lives in: House/apartment Stairs: Yes: External: 4 in front and 3 in back steps; Bilateral rails in front and none in back Has following equipment at home: Single point cane, Walker - 2 wheeled, and [employer] - 4 wheeled  PLOF: Needs assistance with homemaking  PATIENT GOALS: "I am not sure"   OBJECTIVE:   DIAGNOSTIC FINDINGS: CT of Head and cervical spine on 05/17/23  IMPRESSION: CT of the head: No acute intracranial abnormality noted.   Atrophic changes are seen.   CT of the cervical spine: Multilevel  degenerative change without acute abnormality.  COGNITION: Overall cognitive status: Impaired   SENSATION: Pt w/bilateral peripheral neuropathy in feet  EDEMA: Pt reports no, wife states yes in BLEs   POSTURE: rounded shoulders, forward head, and increased thoracic kyphosis   LOWER EXTREMITY MMT:  Tested in seated position   MMT Right Eval Left Eval  Hip flexion 5 5  Hip extension    Hip abduction 5 5  Hip adduction 5 5  Hip internal rotation    Hip external rotation    Knee flexion 5 5  Knee extension 5 5  Ankle dorsiflexion 5 5  Ankle plantarflexion    Ankle inversion    Ankle eversion    (Blank rows = not tested)  VITALS  There were no vitals filed for this visit.    TODAY'S TREATMENT:     Ther Act  LTG Assessment   [facility] PT Assessment - 07/28/23 0859       Ambulation/Gait   Gait velocity 32.8' over 11.31s = 2.9 ft/s w/rollator   12.41s w/o AD = 2.6 ft/s            [facility] PT Assessment - 07/28/23 0859       Ambulation/Gait   Gait velocity 32.8' over 11.31s = 2.9 ft/s w/rollator   12.41s w/o AD = 2.6 ft/s            Gait pattern: step through pattern, shuffling, trunk flexed, and poor foot clearance- Right Distance walked: 115' loop completed 4x + 17' = 477' Assistive device utilized: Environmental consultant - 4 wheeled Level of assistance: Modified independence and Min A Comments: Pt started assessment too fast, averaging 115' in ~44s (compared to 90s at baseline) and exhausted himself by lap 3, requiring min A as pt pushing rollator too far anteriorly and could not slow down. Min A to stop rollator, lock it and assist pt to seated position. Pt stopped 4 minutes into test. Max education on proper management of rollator and importance of slowing down. "I was trying to break a record". Pt able to demonstrate good AD management when ambulating back to mat, maintaining close distance to rollator and staying inside AD as he turned to sit.                PATIENT EDUCATION: Education details: How to obtain new referral if mobility needs change in future, proper management of AD, goal results  Person educated: Patient Education method: Explanation and Demonstration Education comprehension: verbalized understanding and returned demonstration  HOME EXERCISE PROGRAM: Access Code: [email or web address] URL: [email or web address] Date: 07/01/2023 Prepared by: Alethia Berthold Raeshawn Tafolla  Exercises - Standing with feet together and eyes closed with head turns   - 1 x daily - 7 x weekly - 30-45 second  hold - Tandem Stance with Eyes Closed in Corner  - 1 x daily - 7 x weekly - 30-45 second hold  GOALS: Goals reviewed with patient? Yes  SHORT TERM GOALS: Target date: 07/13/2023   Pt will be independent with initial HEP for improved endurance, balance, transfers and gait.  Baseline: not established on eval  Goal status: MET  2.  MCTSIB to be assessed and STG/LTG written  Baseline: 120/120 (7/23) Goal status: DC due to high baseline score   3.  Pt will ambulate greater than or equal to 395 feet on with LRAD mod I for improved cardiovascular endurance and BLE strength.   Baseline: 345' no AD and SBA; 670' w/no AD mod I Goal status: MET  4.  Pt will improve gait velocity to at least 2.4 ft/s w/LRAD for improved gait efficiency and endurance  Baseline:  2.18 ft/s w/no AD; 2.36 ft/s no AD Goal status: PARTIALLY MET   5.  Pt will trial various assistive devices on both indoor and outdoor surfaces to determine least restrictive option that promotes independence and reduces fall risk.  Baseline: Pt has cane, RW and rollator at home; trialed rollator on 7/23 Goal status: MET   LONG TERM GOALS: Target date: 08/10/2023    Pt will be independent with final HEP for improved strength, balance, transfers and gait.  Baseline:  Goal status: MET  2.  Pt will improve FGA to 20 for decreased fall risk   Baseline: 15/30 (7/31); 19/30 (8/21)  Goal  status: PARTIALLY MET    3.  Pt will ambulate greater than or equal to 715 feet on with LRAD mod I for improved cardiovascular endurance and BLE strength.    Baseline: 345' no AD and SBA; 670' w/no AD mod I (8/16); 477' w/rollator, stopped at 4 minutes  Goal status: NOT MET   4.  Pt will improve gait velocity to at least 2.7 ft/s w/LRAD for improved gait efficiency and independence  Baseline: 2.18 ft/s w/no AD; 2.9 ft/s w/rollator  Goal status: MET   5.  Pt will trial various bracing options on BLEs to determine safest option for reduced fall risk  Baseline: Trialed on 7/26, pt does not like  Goal status: MET   ASSESSMENT:  CLINICAL IMPRESSION: Emphasis of skilled PT session on LTG assessment and DC from PT. Pt has met 3 of 5 LTGs, with the first goal being met last session. Pt has improved his gait speed ot 2.9 ft/s w/use of rollator at mod I level and pt is encouraged to use this for all community ambulation. Pt ambulated 477' in 4 minutes on , requiring seated rest break on rollator for remainder of 2 minutes due to starting off too quickly and fatiguing. Pt reports he was "trying to break a record" and acknowledged that he started too quickly. Despite this, pt still ambulated further in 4 minutes today w/AD than he did on eval w/o AD. Pt has significantly improved his balance and gait kinematics but continues to be limited by poor activity tolerance and bilateral peripheral neuropathy. Encouraged pt to return to PT in 3-6 months to continue working on balance and independence. Pt in agreement to DC this date due to satisfaction w/current functional level.   OBJECTIVE IMPAIRMENTS: Abnormal gait, cardiopulmonary status limiting activity, decreased activity tolerance, decreased balance, decreased cognition, decreased endurance, decreased knowledge of condition, decreased knowledge of use of DME, difficulty walking, decreased safety awareness, and impaired sensation  ACTIVITY  LIMITATIONS: carrying, lifting, bending, standing, squatting, transfers, locomotion level, and caring for others  PARTICIPATION LIMITATIONS: meal prep, cleaning, laundry, medication management, driving, shopping, community activity, and yard work  PERSONAL FACTORS: Fitness, Past/current experiences, Transportation, and 1 comorbidity: bilateral peripheral neuropathy  are also affecting patient's functional outcome.   REHAB POTENTIAL: Good  CLINICAL DECISION MAKING: Stable/uncomplicated  EVALUATION COMPLEXITY: Low  PLAN:  PT FREQUENCY: 1-2x/week  PT DURATION: 8 weeks (POC written for 12 weeks)  PLANNED INTERVENTIONS: Therapeutic exercises, Therapeutic activity, Neuromuscular re-education, Balance training, Gait training, Patient/Family education, Self Care, Joint mobilization, Stair training, Vestibular training, Canalith repositioning, Orthotic/Fit training, DME instructions, Aquatic Therapy, Manual therapy, and Re-evaluation    Tay Whitwell E Mineola Duan, PT, DPT 07/28/2023, 9:32 AM

## 2023-07-29 ENCOUNTER — Ambulatory Visit: Payer: No Typology Code available for payment source | Admitting: Physical Therapy

## 2023-08-01 DIAGNOSIS — G4733 Obstructive sleep apnea (adult) (pediatric): Secondary | ICD-10-CM | POA: Diagnosis not present

## 2023-08-10 NOTE — Progress Notes (Signed)
Cardiology Office Note:    Date:  08/18/2023   ID:  Joshua [name]., DOB [birth year], MRN [id number]  PCP:  Nestor Ramp, MD   [facility] Providers Cardiologist:  Lesleigh Noe, MD (Inactive)     Referring MD: Nestor Ramp, MD   CC:  TR and PH  History of Present Illness:    Joshua T Jarad [name]. is a 82 y.o. male with a hx of Hx of CAD with NSTEMI, with CABG in 1990 (RIMA-LAD, LIMA-> LCX, SVG-Diag (100% stenosed), SVG RCA (2011 BMS with 2018 iDES)), HTN, HLD, Mild MR. OSA on CPAP. 2024: Referred to Lipid clinic for PSCK9i (on zetia and max tolerated statin, LDL 91). They did not receive the referral.  Mr. Joshua [name], a 82 year old with a history of coronary artery disease, hypertension, hyperlipidemia, and familial hypercholesterolemia, presents for a follow-up visit. He underwent NSTEMI CABG in 1990 and has been managing his conditions with various medications, including a low-dose statin, which he tolerates well. Recently, he has been having issues affording Marcelline Deist, and an attempt was made to transition him to a different, more cost-effective SGLT2 inhibitor.  In terms of his heart condition, Mr. Joshua [name] reports feeling fine and not experiencing any problems. He has a history of tricuspid regurgitation, which was significant in 2024. He has been maintaining his activity levels, although he reports that his balance has deteriorated due to neuropathy, causing his legs to tire easily and affecting his mobility. He is currently on the highest dose of gabapentin for his neuropathy, but it does not seem to provide significant relief.  Mr. Joshua [name] also reports an burning sensation, primarily in his feet. He denies experiencing bilateral carpal tunnel, passing out, nausea, vomiting, or eye problems. He does not have any dietary restrictions and maintains a varied diet.   Past Medical History:  Diagnosis Date   Coronary artery disease involving native coronary artery of native  heart with angina pectoris (HCC)    a. 1990 s/p CABG x 4 (RIMA->LAD, LIMA->LCX, VG->Diag, VG->RCA); b. 2011 s/p BMS to VG->RCA.; c. Unstable Angina 05/2017: Patent RIMA-LAD & LIMA-LCx, ostSVG-RCA 65% ISR & mSVG-RCA  99% - DES PCI to both // Myoview 05/2019:  EF 57, no ischemia or scar, Low Risk    GERD (gastroesophageal reflux disease)    History of echocardiogram    a. 01/2017 Echo: EF 55-60%, no rwma, mild MR, midlly dil LA, PASP .   History of hiatal hernia    Hyperlipidemia    Hypertension    Obesity     Past Surgical History:  Procedure Laterality Date   CATARACT EXTRACTION W/ INTRAOCULAR LENS  IMPLANT, BILATERAL Bilateral    CORONARY ANGIOPLASTY WITH STENT PLACEMENT  2011   "RCA"   CORONARY ARTERY BYPASS GRAFT  1990   CABG X4   CORONARY STENT INTERVENTION N/A 06/24/2017   Procedure: Coronary Stent Intervention;  Surgeon: Lyn Records, MD;  Location: [facility] INVASIVE CV LAB;  Service: Cardiovascular: Ostial 4.0 x 12 mm Onyx DES reducing 70% stenosis to less than 40%. Distal body 3.5 x 15 Onyx DES reducing 99% stenosis to less than 30%.   LEFT HEART CATH AND CORS/GRAFTS ANGIOGRAPHY N/A 06/24/2017   Procedure: Left Heart Cath and Cors/Grafts Angiography;  Surgeon: Lyn Records, MD;  Location: [facility] INVASIVE CV LAB;  Service: Cardiovascular:  CTO native RCA, LAD & LCx (Graft Dependent). Patent LIMA-LCx & RIMA-LAD.  SVG-RCA: ost 65% ISR & mid 99% --> DES PCI to both  LEFT HEART CATH AND CORS/GRAFTS ANGIOGRAPHY N/A 01/04/2021   Procedure: LEFT HEART CATH AND CORS/GRAFTS ANGIOGRAPHY;  Surgeon: Lyn Records, MD;  Location: [facility] INVASIVE CV LAB;  Service: Cardiovascular;  Laterality: N/A;   RIGHT/LEFT HEART CATH AND CORONARY/GRAFT ANGIOGRAPHY N/A 09/25/2022   Procedure: RIGHT/LEFT HEART CATH AND CORONARY/GRAFT ANGIOGRAPHY;  Surgeon: Kathleene Hazel, MD;  Location: [facility] INVASIVE CV LAB;  Service: Cardiovascular;  Laterality: N/A;    Current Medications: No outpatient medications have been  marked as taking for the 08/18/23 encounter (Office Visit) with Christell Constant, MD.     Allergies:   Ace inhibitors, Atorvastatin, Rosuvastatin, Plavix [clopidogrel bisulfate], Sulfa antibiotics, and Sulfonamide derivatives   Social History   Socioeconomic History   Marital status: Married    Spouse name: [name]   Number of children: 2   Years of education: Not on file   Highest education level: Not on file  Occupational History   Occupation: retired    Comment: manual labor (truck driver, heating and air)  Tobacco Use   Smoking status: Never   Smokeless tobacco: Former    Types: Chew    Quit date: 2000  Vaping Use   Vaping status: Never Used  Substance and Sexual Activity   Alcohol use: Never   Drug use: No   Sexual activity: Not Currently  Other Topics Concern   Not on file  Social History Narrative   Lives locally with his wife [name] - former [employer] ECG tech).  Does not routinely exercise.   Social Determinants of Health   Financial Resource Strain: Medium Risk (04/06/2023)   Overall Financial Resource Strain (CARDIA)    Difficulty of Paying Living Expenses: Somewhat hard  Food Insecurity: No Food Insecurity (04/06/2023)   Hunger Vital Sign    Worried About Running Out of Food in the Last Year: Never true    Ran Out of Food in the Last Year: Never true  Transportation Needs: No Transportation Needs (04/06/2023)   PRAPARE - Administrator, Civil Service (Medical): No    Lack of Transportation (Non-Medical): No  Physical Activity: Inactive (04/06/2023)   Exercise Vital Sign    Days of Exercise per Week: 0 days    Minutes of Exercise per Session: 0 min  Stress: No Stress Concern Present (04/06/2023)   [employer] of Occupational Health - Occupational Stress Questionnaire    Feeling of Stress : Not at all  Social Connections: Socially Isolated (04/06/2023)   Social Connection and Isolation Panel [NHANES]    Frequency of Communication with Friends and  Family: Once a week    Frequency of Social Gatherings with Friends and Family: Once a week    Attends Religious Services: Never    Database administrator or Organizations: No    Attends Engineer, structural: Never    Marital Status: Married     Family History: The patient's family history includes Cancer in his mother; Coronary artery disease in his father; Heart attack in his father; Hyperlipidemia in his son; Hypertension in his son.  ROS:   Please see the history of present illness.    EKGs/Labs/Other Studies Reviewed:    The following studies were reviewed today:  Cardiac Studies & Procedures   CARDIAC CATHETERIZATION  CARDIAC CATHETERIZATION 09/25/2022  Narrative   Mid LM to Dist LM lesion is 100% stenosed.   Ost LAD to Mid LAD lesion is 100% stenosed.   Ramus lesion is 100% stenosed.   Ost Cx to  Prox Cx lesion is 100% stenosed.   Ost RCA to Prox RCA lesion is 100% stenosed.   Origin to Prox Graft lesion is 100% stenosed (SVG to Diagonal)   Origin lesion is 50% stenosed.   Mid Graft lesion is 30% stenosed.   Dist Graft lesion is 40% stenosed.   Non-stenotic Dist RCA lesion.   LIMA to OM patent   RIMA to LAD patent   SVG to PDA patent  Severe native CAD with chronic occlusion of the left main and proximal RCA s/p 4V CABG with 3/4 patent grafts Patent LIMA graft to OM Patent RIMA graft to LAD Patent SVG to PDA with patent stents within the body of the graft Occluded SVG to Diagonal Normal right heart pressures Mild elevation LVEDP (19-23 mmHg)  Recommendations: Continue medical management of CAD.  Findings Coronary Findings Diagnostic  Dominance: Co-dominant  Left Main Mid LM to Dist LM lesion is 100% stenosed.  Left Anterior Descending There is moderate diffuse disease throughout the vessel. Ost LAD to Mid LAD lesion is 100% stenosed.  Ramus Intermedius Ramus lesion is 100% stenosed.  Left Circumflex There is moderate diffuse disease  throughout the vessel. Ost Cx to Prox Cx lesion is 100% stenosed.  Right Coronary Artery Vessel is moderate in size. There is moderate diffuse disease throughout the vessel. Ost RCA to Prox RCA lesion is 100% stenosed. Non-stenotic Dist RCA lesion.  Right Posterior Descending Artery There is moderate disease in the vessel.  LIMA LIMA Graft To 2nd Mrg LIMA.  Graft To 1st Diag Origin to Prox Graft lesion is 100% stenosed.  RIMA RIMA Graft To Dist LAD RIMA.  Saphenous Graft To Dist RCA SVG. Origin lesion is 50% stenosed. The lesion was previously treated . Mid Graft lesion is 30% stenosed. The lesion was previously treated . Dist Graft lesion is 40% stenosed.  Intervention  No interventions have been documented.   CARDIAC CATHETERIZATION  CARDIAC CATHETERIZATION 01/04/2021  Narrative  Ostial occlusion of the left main and native right coronary.  Occlusion of saphenous vein graft to the diagonal.  Patent right internal mammary artery graft to the mid LAD.  Patent left internal mammary artery graft to the circumflex marginal.  Patent saphenous vein graft to the distal RCA which could not be selectively engaged due to overhanging ostial stents.  The distal two thirds of the vessel appeared to be widely patent.  There could be high-grade obstruction at the distal anastomosis or in the native RCA.  Overall normal LV function with normal LVEDP.  EF greater than 50%.  RECOMMENDATIONS:   Patient with advanced atherosclerosis, failed saphenous vein grafts including inability to selectively engaged the saphenous vein graft to the right coronary due to overhanging ostial stent placed in 2018.  Given the current findings, he is really not a candidate for repeated angiography as intervention is not possible unless there is clear evidence of anterior MI in which case perhaps right radial approach could be used to attempt selective engagement of the RIMA and perform salvage PCI on the  LAD.  Same is true if there is concern for circumflex territory MI, perhaps the LIMA could be used as a conduit to treat native circumflex disease.  Findings Coronary Findings Diagnostic  Dominance: Co-dominant  Left Main Mid LM to Dist LM lesion is 100% stenosed.  Left Anterior Descending There is moderate diffuse disease throughout the vessel. Ost LAD to Mid LAD lesion is 100% stenosed.  Ramus Intermedius Ramus lesion is 100% stenosed.  Left Circumflex There is moderate diffuse disease throughout the vessel. Ost Cx to Prox Cx lesion is 100% stenosed.  Right Coronary Artery Vessel is moderate in size. There is moderate diffuse disease throughout the vessel. Ost RCA to Prox RCA lesion is 100% stenosed. Non-stenotic Dist RCA lesion.  Right Posterior Descending Artery There is moderate disease in the vessel.  LIMA LIMA Graft To 2nd Mrg LIMA.  Graft To 1st Diag Origin to Prox Graft lesion is 100% stenosed.  RIMA RIMA Graft To Dist LAD RIMA.  Saphenous Graft To Dist RCA SVG. Origin lesion is 50% stenosed. The lesion was previously treated. Mid Graft lesion is 30% stenosed. The lesion was previously treated. Dist Graft lesion is 40% stenosed.  Intervention  No interventions have been documented.   STRESS TESTS  MYOCARDIAL PERFUSION IMAGING 05/23/2019  Narrative  Nuclear stress EF: 57%.  Normal perfusion No ischemia or scar  This is a low risk study.   ECHOCARDIOGRAM  ECHOCARDIOGRAM COMPLETE 06/19/2022  Narrative ECHOCARDIOGRAM REPORT    Patient Name:   Joshua [name]. Date of Exam: 06/19/2022 Medical Rec #:  [id number]            Height:       68.0 in Accession #:    [id number]           Weight:       232.0 lb Date of Birth:  Jan 20, 1941            BSA:          2.177 m Patient Age:    8 years             BP:           126/62 mmHg Patient Gender: M                    HR:           58 bpm. Exam Location:  Outpatient  Procedure: 2D Echo, Color  Doppler, Cardiac Doppler and Intracardiac Opacification Agent  Indications:    Shortness of breath R06.02  History:        Patient has prior history of Echocardiogram examinations, most recent 01/05/2021. CAD, Prior CABG; Risk Factors:Hypertension and Dyslipidemia.  Sonographer:    Thurman Coyer RDCS Referring Phys: [id number] Dienes [facility]  IMPRESSIONS   1. Left ventricular ejection fraction, by estimation, is 55 to 60%. The left ventricle has normal function. The left ventricle has no regional wall motion abnormalities. There is mild concentric left ventricular hypertrophy. Left ventricular diastolic parameters are indeterminate. 2. Right ventricular systolic function is moderately reduced. The right ventricular size is moderately enlarged. There is mildly elevated pulmonary artery systolic pressure. 3. Right atrial size was moderately dilated. 4. The mitral valve is normal in structure. Mild mitral valve regurgitation. No evidence of mitral stenosis. 5. Tricuspid valve regurgitation is mild to moderate. 6. The aortic valve is tricuspid. There is mild calcification of the aortic valve. There is mild thickening of the aortic valve. Aortic valve regurgitation is not visualized. Aortic valve sclerosis/calcification is present, without any evidence of aortic stenosis. 7. The inferior vena cava is dilated in size with >50% respiratory variability, suggesting right atrial pressure of 8 mmHg.  Comparison(s): A prior study was performed on 01/05/2021. Tricuspid regurgitation is now mild to moderate.  FINDINGS Left Ventricle: Left ventricular ejection fraction, by estimation, is 55 to 60%. The left ventricle has normal function. The left ventricle has no regional wall motion  abnormalities. Definity contrast agent was given IV to delineate the left ventricular endocardial borders. The left ventricular internal cavity size was normal in size. There is mild concentric left ventricular hypertrophy. Left  ventricular diastolic parameters are indeterminate.  Right Ventricle: The right ventricular size is moderately enlarged. No increase in right ventricular wall thickness. Right ventricular systolic function is moderately reduced. There is mildly elevated pulmonary artery systolic pressure. The tricuspid regurgitant velocity is 3.00 m/s, and with an assumed right atrial pressure of 8 mmHg, the estimated right ventricular systolic pressure is 44.0 mmHg.  Left Atrium: Left atrial size was normal in size.  Right Atrium: Right atrial size was moderately dilated.  Pericardium: There is no evidence of pericardial effusion.  Mitral Valve: The mitral valve is normal in structure. Mild mitral annular calcification. Mild mitral valve regurgitation. No evidence of mitral valve stenosis.  Tricuspid Valve: The tricuspid valve is normal in structure. Tricuspid valve regurgitation is mild to moderate. No evidence of tricuspid stenosis.  Aortic Valve: The aortic valve is tricuspid. There is mild calcification of the aortic valve. There is mild thickening of the aortic valve. Aortic valve regurgitation is not visualized. Aortic valve sclerosis/calcification is present, without any evidence of aortic stenosis.  Pulmonic Valve: The pulmonic valve was not well visualized. Pulmonic valve regurgitation is mild. No evidence of pulmonic stenosis.  Aorta: The aortic root is normal in size and structure.  Venous: The inferior vena cava is dilated in size with greater than 50% respiratory variability, suggesting right atrial pressure of 8 mmHg.  IAS/Shunts: No atrial level shunt detected by color flow Doppler.   LEFT VENTRICLE PLAX 2D LVIDd:         4.80 cm   Diastology LVIDs:         3.20 cm   LV e' medial:    9.00 cm/s LV PW:         1.00 cm   LV E/e' medial:  12.2 LV IVS:        1.00 cm   LV e' lateral:   5.43 cm/s LVOT diam:     2.00 cm   LV E/e' lateral: 20.3 LV SV:         71 LV SV Index:   32 LVOT  Area:     3.14 cm   RIGHT VENTRICLE RV Basal diam:  4.90 cm RV Mid diam:    4.30 cm RV S prime:     7.78 cm/s TAPSE (M-mode): 1.4 cm  LEFT ATRIUM             Index        RIGHT ATRIUM           Index LA diam:        3.90 cm 1.79 cm/m   RA Area:     27.40 cm LA Vol (A2C):   56.0 ml 25.73 ml/m  RA Volume:   92.50 ml  42.49 ml/m LA Vol (A4C):   53.7 ml 24.67 ml/m LA Biplane Vol: 55.2 ml 25.36 ml/m AORTIC VALVE LVOT Vmax:   102.00 cm/s LVOT Vmean:  62.900 cm/s LVOT VTI:    0.225 m  AORTA Ao Root diam: 3.30 cm Ao Asc diam:  3.60 cm  MITRAL VALVE                TRICUSPID VALVE MV Area (PHT): 3.85 cm     TR Peak grad:   36.0 mmHg MV Decel Time: 197 msec     TR  Vmax:        300.00 cm/s MV E velocity: 110.00 cm/s MV A velocity: 69.20 cm/s   SHUNTS MV E/A ratio:  1.59         Systemic VTI:  0.22 m Systemic Diam: 2.00 cm  Lavona Mound Tobb DO Electronically signed by Thomasene Ripple DO Signature Date/Time: 06/19/2022/1:40:58 PM    Final              Recent Labs: 02/09/2023: NT-Pro BNP 71 05/17/2023: ALT 16; BUN 18; Creatinine, Ser 1.06; Hemoglobin 12.3; Platelets 175; Potassium 3.9; Sodium 136  Recent Lipid Panel    Component Value Date/Time   CHOL 147 12/25/2022 0804   TRIG 122 12/25/2022 0804   HDL 34 (L) 12/25/2022 0804   CHOLHDL 4.3 12/25/2022 0804   CHOLHDL 6.0 01/05/2021 0214   VLDL 59 (H) 01/05/2021 0214   LDLCALC 91 12/25/2022 0804   LDLDIRECT 87 05/30/2020 1231   LDLDIRECT 84 05/14/2016 1234    Physical Exam:    VS:  BP 128/68   Pulse 61   Ht 5\' 7"  (1.702 m)   Wt 228 lb 3.2 oz (103.5 kg)   SpO2 97%   BMI 35.74 kg/m     Wt Readings from Last 3 Encounters:  08/18/23 228 lb 3.2 oz (103.5 kg)  07/27/23 232 lb 6.4 oz (105.4 kg)  05/25/23 233 lb 3.2 oz (105.8 kg)    GEN: Morbid Obesity No acute distress HEENT: Normal CARDIAC: RRR, no rubs, gallops; systolic murmur RESPIRATORY:  Clear to auscultation without rales, wheezing or rhonchi  ABDOMEN: Soft,  non-tender, non-distended MUSCULOSKELETAL:  trace, improved edema; No deformity  SKIN: Warm and dry NEUROLOGIC:  Alert and oriented x 3 PSYCHIATRIC:  Normal affect   ASSESSMENT:    1. Nonrheumatic tricuspid valve regurgitation   2. Hyperlipidemia LDL goal <70   3. Bilateral lower extremity edema   4. Coronary artery disease involving native coronary artery of native heart with angina pectoris (HCC)   5. Pulmonary hypertension, unspecified (HCC)   6. Essential hypertension   7. Hyperlipidemia with target low density lipoprotein (LDL) cholesterol less than 70 mg/dL   8. History of non-ST elevation myocardial infarction (NSTEMI)     PLAN:    Pulmonary Hypertension Moderate Tricuspid Regurgitation - WHO Functional Class III, Stage C, hypervolemic, WHO III suspected in the setting of OSA and morbid obesity - Diuretic regimen: on chlorthalidone and SGLT2i; BMP and BNP today, may increase to daily lasix - Fluid restriction of < 2 L  - continue compression stockings - Last echocardiogram in 2023 showed significant tricuspid regurgitation. No reported worsening of symptoms. -Repeat echocardiogram to assess current status of tricuspid regurgitation. - will look into Brenzavvy 20 mg  Coronary Artery Disease Hyperlipidemia Statin myopathy on higher doses of statin - Stable, no reported chest pain. On multiple medications including aspirin, Tenoretic, isosorbide mononitrate, and Ranexa; has needed nor PRN nitroglycerin - anatomy: RIMA-LAD, LIMA-> LCX, SVG-Diag (100% stenosed), SVG RCA (2011 BMS with 2018 iDES) -Continue current medications. -On Zetia and low dose statin due to statin intolerance. Previous referral to lipid clinic declined by patient. -Check LDL Direct to assess current cholesterol control. -Consider Nexlizet if LDL remains high and patient can obtain through [facility].  Neuropathy Patient reports balance issues and tired legs. On highest dose of gabapentin. -No  changes to current management; symptoms are not suggestive of amyloid neuropathy  One year        Medication Adjustments/Labs and Tests Ordered: Current medicines are  reviewed at length with the patient today.  Concerns regarding medicines are outlined above.  Orders Placed This Encounter  Procedures   LDL cholesterol, direct   Basic metabolic panel   Pro b natriuretic peptide (BNP)   ECHOCARDIOGRAM COMPLETE   No orders of the defined types were placed in this encounter.   Patient Instructions  Medication Instructions:  Your physician recommends that you continue on your current medications as directed. Please refer to the Current Medication list given to you today.  *If you need a refill on your cardiac medications before your next appointment, please call your pharmacy*   Lab Work: LDL Direct, BMP, BNP  If you have labs (blood work) drawn today and your tests are completely normal, you will receive your results only by: MyChart Message (if you have MyChart) OR A paper copy in the mail If you have any lab test that is abnormal or we need to change your treatment, we will call you to review the results.   Testing/Procedures: Your physician has requested that you have an echocardiogram. Echocardiography is a painless test that uses sound waves to create images of your heart. It provides your doctor with information about the size and shape of your heart and how well your heart's chambers and valves are working. This procedure takes approximately one hour. There are no restrictions for this procedure. Please do NOT wear cologne, perfume, aftershave, or lotions (deodorant is allowed). Please arrive 15 minutes prior to your appointment time.    Follow-Up: At [facility], you and your health needs are our priority.  As part of our continuing mission to provide you with exceptional heart care, we have created designated Provider Care Teams.  These Care Teams include  your primary Cardiologist (physician) and Advanced Practice Providers (APPs -  Physician Assistants and Nurse Practitioners) who all work together to provide you with the care you need, when you need it.   Your next appointment:   1 year(s)  Provider:   Riley Lam, MD     Signed, Christell Constant, MD  08/18/2023 9:10 AM    [facility]

## 2023-08-11 DIAGNOSIS — G4733 Obstructive sleep apnea (adult) (pediatric): Secondary | ICD-10-CM | POA: Diagnosis not present

## 2023-08-12 ENCOUNTER — Encounter: Payer: Self-pay | Admitting: Internal Medicine

## 2023-08-18 ENCOUNTER — Telehealth: Payer: Self-pay | Admitting: Internal Medicine

## 2023-08-18 ENCOUNTER — Other Ambulatory Visit (HOSPITAL_COMMUNITY): Payer: Self-pay

## 2023-08-18 ENCOUNTER — Encounter: Payer: Self-pay | Admitting: Internal Medicine

## 2023-08-18 ENCOUNTER — Encounter: Payer: Self-pay | Admitting: Pharmacist

## 2023-08-18 ENCOUNTER — Ambulatory Visit: Payer: No Typology Code available for payment source | Attending: Internal Medicine | Admitting: Internal Medicine

## 2023-08-18 VITALS — BP 128/68 | HR 61 | Ht 67.0 in | Wt 228.2 lb

## 2023-08-18 DIAGNOSIS — E785 Hyperlipidemia, unspecified: Secondary | ICD-10-CM | POA: Diagnosis not present

## 2023-08-18 DIAGNOSIS — R600 Localized edema: Secondary | ICD-10-CM

## 2023-08-18 DIAGNOSIS — I252 Old myocardial infarction: Secondary | ICD-10-CM

## 2023-08-18 DIAGNOSIS — I361 Nonrheumatic tricuspid (valve) insufficiency: Secondary | ICD-10-CM | POA: Diagnosis not present

## 2023-08-18 DIAGNOSIS — I10 Essential (primary) hypertension: Secondary | ICD-10-CM

## 2023-08-18 DIAGNOSIS — I25119 Atherosclerotic heart disease of native coronary artery with unspecified angina pectoris: Secondary | ICD-10-CM | POA: Diagnosis not present

## 2023-08-18 DIAGNOSIS — I272 Pulmonary hypertension, unspecified: Secondary | ICD-10-CM

## 2023-08-18 NOTE — Telephone Encounter (Signed)
Spoke with pt's wife, ID corrected, med processing.

## 2023-08-18 NOTE — Patient Instructions (Signed)
Medication Instructions:  Your physician recommends that you continue on your current medications as directed. Please refer to the Current Medication list given to you today.  *If you need a refill on your cardiac medications before your next appointment, please call your pharmacy*   Lab Work: LDL Direct, BMP, BNP  If you have labs (blood work) drawn today and your tests are completely normal, you will receive your results only by: MyChart Message (if you have MyChart) OR A paper copy in the mail If you have any lab test that is abnormal or we need to change your treatment, we will call you to review the results.   Testing/Procedures: Your physician has requested that you have an echocardiogram. Echocardiography is a painless test that uses sound waves to create images of your heart. It provides your doctor with information about the size and shape of your heart and how well your heart's chambers and valves are working. This procedure takes approximately one hour. There are no restrictions for this procedure. Please do NOT wear cologne, perfume, aftershave, or lotions (deodorant is allowed). Please arrive 15 minutes prior to your appointment time.    Follow-Up: At [facility], you and your health needs are our priority.  As part of our continuing mission to provide you with exceptional heart care, we have created designated Provider Care Teams.  These Care Teams include your primary Cardiologist (physician) and Advanced Practice Providers (APPs -  Physician Assistants and Nurse Practitioners) who all work together to provide you with the care you need, when you need it.   Your next appointment:   1 year(s)  Provider:   Riley Lam, MD

## 2023-08-18 NOTE — Telephone Encounter (Signed)
Wife stated she is at the pharmacy now and they are rejecting the patient's card to get this medication.

## 2023-08-24 ENCOUNTER — Other Ambulatory Visit: Payer: Self-pay | Admitting: Family Medicine

## 2023-09-03 ENCOUNTER — Ambulatory Visit (HOSPITAL_COMMUNITY): Payer: No Typology Code available for payment source | Attending: Internal Medicine

## 2023-09-03 DIAGNOSIS — E785 Hyperlipidemia, unspecified: Secondary | ICD-10-CM | POA: Diagnosis not present

## 2023-09-03 DIAGNOSIS — I361 Nonrheumatic tricuspid (valve) insufficiency: Secondary | ICD-10-CM

## 2023-09-03 DIAGNOSIS — R600 Localized edema: Secondary | ICD-10-CM

## 2023-09-03 MED ORDER — PERFLUTREN LIPID MICROSPHERE
1.0000 mL | INTRAVENOUS | Status: AC | PRN
Start: 2023-09-03 — End: 2023-09-03
  Administered 2023-09-03: 2 mL via INTRAVENOUS

## 2023-09-04 ENCOUNTER — Telehealth: Payer: Self-pay

## 2023-09-04 DIAGNOSIS — I361 Nonrheumatic tricuspid (valve) insufficiency: Secondary | ICD-10-CM

## 2023-09-04 LAB — ECHOCARDIOGRAM COMPLETE
AR max vel: 1.46 cm2
AV Area VTI: 1.53 cm2
AV Area mean vel: 1.43 cm2
AV Mean grad: 7.9 mm[Hg]
AV Peak grad: 15.6 mm[Hg]
Ao pk vel: 1.97 m/s
Area-P 1/2: 3.28 cm2
S' Lateral: 3 cm

## 2023-09-04 MED ORDER — FUROSEMIDE 20 MG PO TABS: 20.0000 mg | ORAL_TABLET | Freq: Every day | ORAL | 3 refills | Status: DC

## 2023-09-04 MED ORDER — POTASSIUM CHLORIDE ER 10 MEQ PO TBCR: 10.0000 meq | EXTENDED_RELEASE_TABLET | Freq: Every day | ORAL | 3 refills | Status: DC

## 2023-09-04 NOTE — Telephone Encounter (Signed)
-----   Message from Christell Constant sent at 09/04/2023 12:52 PM EDT ----- Results: Mild to moderate TR Mild RV dysfunction Plan: Repeat study in one year Lasix 20 mg daily, K daily and labs in two weeks  Christell Constant, MD

## 2023-09-04 NOTE — Telephone Encounter (Signed)
The patient spouse [name] has been notified of the result and verbalized understanding.  All questions (if any) were answered. Macie Burows, RN 09/04/2023 5:18 PM Will have repeat labs on 09/18/23.

## 2023-09-11 ENCOUNTER — Encounter: Payer: Self-pay | Admitting: Student

## 2023-09-11 ENCOUNTER — Ambulatory Visit: Payer: No Typology Code available for payment source | Attending: Cardiology | Admitting: Student

## 2023-09-11 ENCOUNTER — Other Ambulatory Visit (HOSPITAL_COMMUNITY): Payer: Self-pay

## 2023-09-11 ENCOUNTER — Telehealth: Payer: Self-pay | Admitting: Pharmacist

## 2023-09-11 ENCOUNTER — Telehealth: Payer: Self-pay | Admitting: Pharmacy Technician

## 2023-09-11 DIAGNOSIS — E785 Hyperlipidemia, unspecified: Secondary | ICD-10-CM

## 2023-09-11 NOTE — Telephone Encounter (Signed)
Pharmacy Patient Advocate Encounter  Received notification from [facility] that Prior Authorization for repatha has been APPROVED from 09/11/23 to 09/10/24. Ran test claim, Copay is $131.35. This test claim was processed through [facility]- copay amounts may vary at other pharmacies due to pharmacy/plan contracts, or as the patient moves through the different stages of their insurance plan.   PA #/Case ID/Reference #: [id number]

## 2023-09-11 NOTE — Telephone Encounter (Signed)
Pharmacy Patient Advocate Encounter   Received notification from Pt Calls Messages that prior authorization for repatha is required/requested.   Insurance verification completed.   The patient is insured through [facility] .   Per test claim: PA required; PA submitted to [facility] via CoverMyMeds Key/confirmation #/EOC [facility] Status is pending

## 2023-09-11 NOTE — Patient Instructions (Addendum)
Your Results:             Your most recent labs Goal  LDL (lousy/bad cholesterol 82 < 55   Medication changes: We will start the process to get PCSK9i (Repatha or Praluent)  covered by your insurance.  Once the prior authorization is complete, we will call you to let you know and confirm pharmacy information.    Praluent is a cholesterol medication that improved your body's ability to get rid of "bad cholesterol" known as LDL. It can lower your LDL up to 60%. It is an injection that is given under the skin every 2 weeks. The most common side effects of Praluent include runny nose, symptoms of the common cold, rarely flu or flu-like symptoms, back/muscle pain in about 3-4% of the patients, and redness, pain, or bruising at the injection site.    Repatha is a cholesterol medication that improved your body's ability to get rid of "bad cholesterol" known as LDL. It can lower your LDL up to 60%! It is an injection that is given under the skin every 2 weeks. The most common side effects of Repatha include runny nose, symptoms of the common cold, rarely flu or flu-like symptoms, back/muscle pain in about 3-4% of the patients, and redness, pain, or bruising at the injection site.   Lab orders: We want to repeat labs after 2-3 months.  We will send you a lab order to remind you once we get closer to that time.        Copay Assistance:  The [employer] foundation offers assistance to help pay for medication copays.  They will cover copays for all cholesterol lowering meds, including statins, fibrates, omega-3 oils, ezetimibe, Repatha, Praluent, Nexletol, Nexlizet.  The cards are usually good for $2,500 or 12 months, whichever comes first. Go to healthwellfoundation.org Click on "Apply Now" Answer questions as to whom is applying (patient or representative) Your disease fund will be "hypercholesterolemia - Medicare access" Select the cholesterol medication you need assistance with (Repatha, Praluent,  Nexlizet...) They will ask question about qualifying diagnosis - you can mark "yes"; and do you have insurance coverage.   When they ask what type of assistance you are interested in - "copay assistance" When you submit, the approval is usually within minutes.  You will need to print the card information from the site You will need to show this information to your pharmacy, they will bill your Medicare Part D plan first -then bill [employer] --for the copay.   You can also call them at [phone], although the hold times can be quite long.

## 2023-09-11 NOTE — Progress Notes (Signed)
Patient ID: Joshua [name].                 DOB: June 12, 1941                    MRN: [id number]      HPI: Joshua [name]. is a 82 y.o. male patient referred to lipid clinic by Dr.Chandrasekhar. PMH is significant for CAD, CABG -42 (at age of 47) STEMI July 2018 , HTN, pulmonary hypertension, OSA, HLD.  Patient presented today with his wife for lipid clinic. Reports he has family and personal hx of premature ASCVD. He has been following healthy diet but admits he can improve some of his dietary habits. He is not able to exercise due to peripheral neuropathy. We discussed  PCSK-9 inhibitors, bempedoic acid and inclisiran.  Discussed mechanisms of action, dosing, side effects and potential decreases in LDL cholesterol.  Also reviewed cost information and potential options for patient assistance.  Current Medications: Crestor 20 mg daily and Zetia 10 mg daily  Intolerances: none  Risk Factors: CAD, CABG -50 (at age of 89) STEMI July 2018 , HTN, family hx of premature MI LDL goal: <55 mg/dl   Diet: try to eat healthy but agrees he could improve   Exercise: none like but he likes to do yard work   Family History:  Father - Massive MI in early 50's, T2DM, high cholesterol  Father side uncle had heart disease and high cholesterol  Daughter - hyperlipidemia   Social History:  Alcohol: none Smoking : never  Chewed tobacco but quit 20 years ago   Labs: Lipid Panel     Component Value Date/Time   CHOL 147 12/25/2022 0804   TRIG 122 12/25/2022 0804   HDL 34 (L) 12/25/2022 0804   CHOLHDL 4.3 12/25/2022 0804   CHOLHDL 6.0 01/05/2021 0214   VLDL 59 (H) 01/05/2021 0214   LDLCALC 91 12/25/2022 0804   LDLDIRECT 82 08/18/2023 0917   LDLDIRECT 84 05/14/2016 1234   LABVLDL 22 12/25/2022 0804    Past Medical History:  Diagnosis Date   Coronary artery disease involving native coronary artery of native heart with angina pectoris (HCC)    a. 1990 s/p CABG x 4 (RIMA->LAD, LIMA->LCX,  VG->Diag, VG->RCA); b. 2011 s/p BMS to VG->RCA.; c. Unstable Angina 05/2017: Patent RIMA-LAD & LIMA-LCx, ostSVG-RCA 65% ISR & mSVG-RCA  99% - DES PCI to both // Myoview 05/2019:  EF 57, no ischemia or scar, Low Risk    GERD (gastroesophageal reflux disease)    History of echocardiogram    a. 01/2017 Echo: EF 55-60%, no rwma, mild MR, midlly dil LA, PASP .   History of hiatal hernia    Hyperlipidemia    Hypertension    Obesity     Current Outpatient Medications on File Prior to Visit  Medication Sig Dispense Refill   ALPRAZolam (XANAX) 0.5 MG tablet TAKE 1 TABLET BY MOUTH AT BEDTIME AS NEEDED FOR RESTLESS LEGS 45 tablet 2   aspirin EC 81 MG EC tablet Take 1 tablet (81 mg total) by mouth daily. 30 tablet 0   atenolol-chlorthalidone (TENORETIC) 100-25 MG tablet Take 1 tablet by mouth daily. 90 tablet 3   citalopram (CELEXA) 20 MG tablet Take 1 tablet (20 mg total) by mouth daily. 90 tablet 3   esomeprazole (NEXIUM) 20 MG capsule Take 1 capsule (20 mg total) by mouth daily. 90 capsule 3   ezetimibe (ZETIA) 10 MG tablet Take 1 tablet (10  mg total) by mouth daily. 90 tablet 3   FARXIGA 10 MG TABS tablet Take 1 tablet (10 mg total) by mouth daily before breakfast. 100 tablet 3   furosemide (LASIX) 20 MG tablet Take 1 tablet (20 mg total) by mouth daily. 90 tablet 3   gabapentin (NEURONTIN) 400 MG capsule Take one by mouth five times a day as directed 450 capsule 3   isosorbide mononitrate (IMDUR) 120 MG 24 hr tablet Take 1 tablet (120 mg total) by mouth daily. 90 tablet 2   nitroGLYCERIN (NITROSTAT) 0.4 MG SL tablet Place 1 tablet (0.4 mg total) under the tongue every 5 (five) minutes as needed for chest pain. 25 tablet 11   potassium chloride (KLOR-CON) 10 MEQ tablet Take 1 tablet (10 mEq total) by mouth daily. 90 tablet 3   ranolazine (RANEXA) 500 MG 12 hr tablet Take 1 tablet (500 mg total) by mouth 2 (two) times daily. 60 tablet 11   rosuvastatin (CRESTOR) 20 MG tablet TAKE 1 TABLET BY MOUTH  EVERY DAY 90 tablet 1   No current facility-administered medications on file prior to visit.    Allergies  Allergen Reactions   Ace Inhibitors Cough   Atorvastatin Other (See Comments)    Muscle pain   Rosuvastatin Other (See Comments)    Muscle pain   Plavix [Clopidogrel Bisulfate] Itching and Rash   Sulfa Antibiotics Itching and Rash   Sulfonamide Derivatives Itching and Rash    Assessment/Plan:  1. Hyperlipidemia -  Problem  Hyperlipidemia Ldl Goal <55   Current Medications: Crestor 20 mg daily and Zetia 10 mg daily  Intolerances: none  Risk Factors: CAD, CABG -66 (at age of 68) STEMI July 2018 , HTN, family hx of premature MI LDL goal: <55 mg/dl    Hyperlipidemia With Target Low Density Lipoprotein (Ldl) Cholesterol Less Than 70 Mg/Dl (Resolved)   Qualifier: Diagnosis of  By: Knox Royalty      Hyperlipidemia LDL goal <55 Assessment:  LDL goal: < 55 mg/dl last LDLc 82 mg/dl (30/86/5784) Tolerates Zetia and high intensity statins well without any side effects  Discussed next potential options (PCSK-9 inhibitors, bempedoic acid and inclisiran); cost, dosing efficacy, side effects  Reiterated importance of regular exercise and heart healthy diet   Plan: Continue taking current medications (Crestor 20 mg daily and Zetia 10 mg daily ) Will apply for PA for PCSK9i; will inform patient upon approval  Lipid lab due in 2-3 months after starting PCSK9i    Thank you,  Carmela Hurt, Pharm.D [facility] [address] [place], [place] [address]  Phone: [phone]; Fax: [phone]

## 2023-09-11 NOTE — Assessment & Plan Note (Signed)
Assessment:  LDL goal: < 55 mg/dl last LDLc 82 mg/dl (08/65/7846) Tolerates Zetia and high intensity statins well without any side effects  Discussed next potential options (PCSK-9 inhibitors, bempedoic acid and inclisiran); cost, dosing efficacy, side effects  Reiterated importance of regular exercise and heart healthy diet   Plan: Continue taking current medications (Crestor 20 mg daily and Zetia 10 mg daily ) Will apply for PA for PCSK9i; will inform patient upon approval  Lipid lab due in 2-3 months after starting [facility]

## 2023-09-14 MED ORDER — REPATHA SURECLICK 140 MG/ML ~~LOC~~ SOAJ: 140.0000 mg | SUBCUTANEOUS | 3 refills | Status: DC

## 2023-09-14 NOTE — Telephone Encounter (Signed)
Spoke to wife, 135/per month is cost prohibitive, will provide some samples till [facility] re-open. Advised patient to use Repatha samples every 3 weeks instead of 2 weeks so that supply last little longer.   [facility] is out of samples, will check at [facility] [address]. If we can we will provide 2 pens.

## 2023-09-14 NOTE — Telephone Encounter (Signed)
PA approved see other encounter

## 2023-09-14 NOTE — Addendum Note (Signed)
Addended by: Tylene Fantasia on: 09/14/2023 11:33 AM   Modules accepted: Orders

## 2023-09-16 ENCOUNTER — Telehealth: Payer: Self-pay

## 2023-09-16 MED ORDER — REPATHA SURECLICK 140 MG/ML ~~LOC~~ SOAJ: 140.0000 mg | SUBCUTANEOUS | 0 refills | Status: DC

## 2023-09-16 NOTE — Telephone Encounter (Signed)
Patients wife calls nurse line requesting an apt.  She reports he has a "lump" on his upper thigh. She reports it is the size of a half dollar. She reports it is tender to touch and "a little red." She reports he noticed it yesterday.   She denies any swelling, fevers, chills or drainage.   Patient scheduled for tomorrow for evaluation.   Precautions discussed in the meantime.

## 2023-09-16 NOTE — Addendum Note (Signed)
Addended by: Tylene Fantasia on: 09/16/2023 10:14 AM   Modules accepted: Orders

## 2023-09-16 NOTE — Telephone Encounter (Signed)
Left Repatha samples at [facility] st office. Patient made aware, have lab appointment on Friday 09/18/2023 will collect that day.

## 2023-09-17 ENCOUNTER — Encounter: Payer: Self-pay | Admitting: Student

## 2023-09-17 ENCOUNTER — Ambulatory Visit (INDEPENDENT_AMBULATORY_CARE_PROVIDER_SITE_OTHER): Payer: No Typology Code available for payment source | Admitting: Student

## 2023-09-17 VITALS — BP 136/59 | HR 59 | Ht 66.0 in | Wt 228.6 lb

## 2023-09-17 DIAGNOSIS — L03119 Cellulitis of unspecified part of limb: Secondary | ICD-10-CM

## 2023-09-17 MED ORDER — CEPHALEXIN 500 MG PO CAPS
500.0000 mg | ORAL_CAPSULE | Freq: Three times a day (TID) | ORAL | 0 refills | Status: AC
Start: 2023-09-17 — End: 2023-09-22

## 2023-09-17 NOTE — Progress Notes (Signed)
    SUBJECTIVE:   CHIEF COMPLAINT / HPI:   Patient is a 82 year old male presenting today for concerns of a lump on his right inner thigh.  Lesion was noticed about 2 days ago spontaneously.  He said he has not had any trauma to the thigh.  He has denied any fever, chills .  Currently patient's lesion is erythematous and tender with palpation.  PERTINENT  PMH / PSH: Reviewed   OBJECTIVE:   BP (!) 136/59   Pulse (!) 59   Ht 5\' 6"  (1.676 m)   Wt 228 lb 9.6 oz (103.7 kg)   SpO2 97%   BMI 36.90 kg/m    Physical Exam General: Alert, well appearing, NAD Cardiovascular: RRR, No Murmurs, Normal S2/S2 Respiratory: CTAB, No wheezing or Rales Skin: Warm and dry  ASSESSMENT/PLAN:   Skin Lesion Located on the left thigh. Suspect cellulitis although an on usual location but patient endorses erythema, tenderness and warms over the infected skin. No Rash or site of insect bit. Lesion doesn't appear purulent or have a pore to be drained. Will treat Keflex 500 mg 3 times daily for 5 days and follow up closely. If no improvement would consider ultrasound at follow up appointment in a week.   Jerre Simon, MD [facility]

## 2023-09-17 NOTE — Patient Instructions (Addendum)
It was wonderful to meet you today. Thank you for allowing me to be a part of your care. Below is a short summary of what we discussed at your visit today:  I suspect the lump on your left thigh is possible cellulitis, which is infection of the skin.  I am sending in antibiotics which you will take 3 times daily for 5 days.  Please follow-up in 1 week to assess improvement.   If you have any questions or concerns, please do not hesitate to contact us via phone or MyChart message.   Jerre Simon, MD [facility]

## 2023-09-18 ENCOUNTER — Ambulatory Visit: Payer: No Typology Code available for payment source | Attending: Internal Medicine

## 2023-09-18 DIAGNOSIS — I361 Nonrheumatic tricuspid (valve) insufficiency: Secondary | ICD-10-CM | POA: Diagnosis not present

## 2023-09-19 LAB — BASIC METABOLIC PANEL
BUN/Creatinine Ratio: 13 (ref 10–24)
BUN: 14 mg/dL (ref 8–27)
CO2: 25 mmol/L (ref 20–29)
Calcium: 9 mg/dL (ref 8.6–10.2)
Chloride: 97 mmol/L (ref 96–106)
Creatinine, Ser: 1.07 mg/dL (ref 0.76–1.27)
Glucose: 150 mg/dL — ABNORMAL HIGH (ref 70–99)
Potassium: 3.5 mmol/L (ref 3.5–5.2)
Sodium: 137 mmol/L (ref 134–144)
eGFR: 71 mL/min/{1.73_m2} (ref >59)

## 2023-09-21 ENCOUNTER — Other Ambulatory Visit: Payer: No Typology Code available for payment source

## 2023-09-23 ENCOUNTER — Ambulatory Visit (INDEPENDENT_AMBULATORY_CARE_PROVIDER_SITE_OTHER): Payer: No Typology Code available for payment source | Admitting: Student

## 2023-09-23 ENCOUNTER — Encounter: Payer: Self-pay | Admitting: Student

## 2023-09-23 VITALS — BP 155/65 | HR 54 | Ht 66.0 in | Wt 227.2 lb

## 2023-09-23 DIAGNOSIS — I10 Essential (primary) hypertension: Secondary | ICD-10-CM

## 2023-09-23 NOTE — Assessment & Plan Note (Signed)
Systolic BP elevated today x 2.  Currently on Amador and Lasix.  He has shared decision patient agrees to check BP daily and keep blood pressure logs and if elevated above 145/80 we will follow-up with PCP or cardiology to consider initiation of BP medication.

## 2023-09-23 NOTE — Patient Instructions (Addendum)
It was wonderful to meet you today. Thank you for allowing me to be a part of your care. Below is a short summary of what we discussed at your visit today:  Glad to see that the knot, pain and redness on the left thigh has cleared since completing your antibiotics.   If you have any questions or concerns, please do not hesitate to contact us via phone or MyChart message.   Jerre Simon, MD [facility]

## 2023-09-23 NOTE — Progress Notes (Signed)
    SUBJECTIVE:   CHIEF COMPLAINT / HPI:   82 year old male presenting today for follow-up for left lower extremity lesion.  He was seen last week for a lump on his left upper thigh concerning for cellulitis and treated with Keflex.  Today patient reports relieve of symptoms. He no longer have any knot or pain on his left thigh  PERTINENT  PMH / PSH: Reviewed  OBJECTIVE:   BP (!) 155/65   Pulse (!) 54   Ht 5\' 6"  (1.676 m)   Wt 227 lb 3.2 oz (103.1 kg)   SpO2 100%   BMI 36.67 kg/m    Physical Exam General: Alert, well appearing, NAD Cardiovascular: RRR, No Murmurs, Normal S2/S2 Respiratory: CTAB, No wheezing or Rales Left thigh: No notable lump, erythema or tenderness .  ASSESSMENT/PLAN:   Essential hypertension Systolic BP elevated today x 2.  Currently on Amador and Lasix.  He has shared decision patient agrees to check BP daily and keep blood pressure logs and if elevated above 145/80 we will follow-up with PCP or cardiology to consider initiation of BP medication.   Cellulitis Per report from patient and exam finding shows resolution of left thigh cellulitis treated with Keflex.  Jerre Simon, MD [facility]

## 2023-10-14 ENCOUNTER — Telehealth: Payer: Self-pay | Admitting: Internal Medicine

## 2023-10-14 MED ORDER — RANOLAZINE ER 500 MG PO TB12: 500.0000 mg | ORAL_TABLET | Freq: Two times a day (BID) | ORAL | 3 refills | Status: DC

## 2023-10-14 NOTE — Telephone Encounter (Signed)
*  STAT* If patient is at the pharmacy, call can be transferred to refill team.   1. Which medications need to be refilled? (please list name of each medication and dose if known) Ranolazine   2. Would you like to learn more about the convenience, safety, & potential cost savings by using the [facility]?    3. Are you open to using the [facility] (Type [facility].    4. Which pharmacy/location (including street and city if local pharmacy) is medication to be sent to?[facility] [place]   5. Do they need a 30 day or 90 day supply? 90 days and refills

## 2023-10-14 NOTE — Telephone Encounter (Signed)
Pt's medication was sent to pt's pharmacy as requested. Confirmation received.  °

## 2023-10-27 NOTE — Progress Notes (Unsigned)
    SUBJECTIVE:   CHIEF COMPLAINT / HPI:   Rash Worsening over the past few months. Started with one itchy lesion that is now getting worse. Tried fragrance free lotion without relief.  Denies any exposure including walking in tall grass or brush, animal exposure, new detergents/lotions/soaps.  History of varicose veins.  PERTINENT  PMH / PSH: CAD, GERD, HLD, hypertension  OBJECTIVE:   BP 128/71   Pulse 64   Ht 5\' 6"  (1.676 m)   Wt 224 lb 2 oz (101.7 kg)   SpO2 96%   BMI 36.17 kg/m    General: NAD, pleasant, able to participate in exam Skin: Erythematous, papular rash present on bilateral ankles (R>L).  Appears to extend up right leg to just below the knee.  Distinct cut off line of rash that socks, no rash noted on feet.  Numerous varicose veins on legs bilaterally   ASSESSMENT/PLAN:   Assessment & Plan Contact dermatitis, unspecified contact dermatitis type, unspecified trigger Exam is consistent with contact dermatitis given sharp demarcation at sock line.  Component of chronic venous stasis dermatitis also possible given numerous varicose veins and CAD history. -Triamcinolone 0.025% ointment twice daily x 2 weeks -Supportive care with compression socks and elevation to improve venous component   Dr. Elberta Fortis, DO [facility]

## 2023-10-28 ENCOUNTER — Ambulatory Visit: Payer: No Typology Code available for payment source | Admitting: Family Medicine

## 2023-10-28 ENCOUNTER — Encounter: Payer: Self-pay | Admitting: Family Medicine

## 2023-10-28 VITALS — BP 128/71 | HR 64 | Ht 66.0 in | Wt 224.1 lb

## 2023-10-28 DIAGNOSIS — L259 Unspecified contact dermatitis, unspecified cause: Secondary | ICD-10-CM | POA: Diagnosis not present

## 2023-10-28 MED ORDER — TRIAMCINOLONE ACETONIDE 0.025 % EX OINT
1.0000 | TOPICAL_OINTMENT | Freq: Two times a day (BID) | CUTANEOUS | 1 refills | Status: DC
Start: 2023-10-28 — End: 2024-05-05

## 2023-10-28 NOTE — Patient Instructions (Signed)
It was wonderful to see you today! Thank you for choosing [facility].   Please bring ALL of your medications with you to every visit.   Today we talked about:  Please use triamcinolone cream twice daily for the next week and assess for improvement in symptoms.  I would continue to use the cream 1 to 2 days after symptom resolution and then he can stop using it altogether.  Please keep in mind it could be due to skin contact if something so keep an eye out for any triggers.  If symptoms are still there after 1 to 2 weeks consider return home to discuss further. I would also recommend getting compression socks and elevation of your legs as this may help with the discoloration and varicose veins.  Please follow up as needed for persistent symptoms  Call the clinic at [phone] if your symptoms worsen or you have any concerns.  Please be sure to schedule follow up at the front desk before you leave today.   Elberta Fortis, DO Family Medicine

## 2023-12-01 ENCOUNTER — Other Ambulatory Visit: Payer: Self-pay | Admitting: Family Medicine

## 2023-12-01 ENCOUNTER — Other Ambulatory Visit: Payer: Self-pay | Admitting: Internal Medicine

## 2023-12-07 ENCOUNTER — Encounter: Payer: Self-pay | Admitting: Internal Medicine

## 2023-12-08 ENCOUNTER — Other Ambulatory Visit (HOSPITAL_COMMUNITY): Payer: Self-pay

## 2023-12-08 NOTE — Telephone Encounter (Signed)
 Spoke to wife and shared [employer] info over the phone.

## 2024-01-04 ENCOUNTER — Encounter: Payer: Self-pay | Admitting: Family Medicine

## 2024-01-04 ENCOUNTER — Encounter: Payer: Self-pay | Admitting: Internal Medicine

## 2024-01-06 ENCOUNTER — Other Ambulatory Visit: Payer: Self-pay | Admitting: Internal Medicine

## 2024-01-06 ENCOUNTER — Other Ambulatory Visit: Payer: Self-pay | Admitting: Family Medicine

## 2024-02-10 ENCOUNTER — Other Ambulatory Visit (HOSPITAL_COMMUNITY): Payer: Self-pay

## 2024-02-13 ENCOUNTER — Other Ambulatory Visit: Payer: Self-pay | Admitting: Internal Medicine

## 2024-02-19 ENCOUNTER — Other Ambulatory Visit: Payer: Self-pay | Admitting: Family Medicine

## 2024-02-22 ENCOUNTER — Telehealth: Payer: Self-pay | Admitting: Internal Medicine

## 2024-02-22 ENCOUNTER — Telehealth: Payer: Self-pay | Admitting: Pharmacy Technician

## 2024-02-22 ENCOUNTER — Other Ambulatory Visit (HOSPITAL_COMMUNITY): Payer: Self-pay

## 2024-02-22 NOTE — Telephone Encounter (Signed)
 Pharmacy Patient Advocate Encounter   Received notification from Pt Calls Messages that prior authorization for repatha is required/requested.   Insurance verification completed.   The patient is insured through [facility] ADVANTAGE/RX ADVANCE .   Per test claim: PA required; PA submitted to above mentioned insurance via CoverMyMeds Key/confirmation #/EOC [id number] Status is pending

## 2024-02-22 NOTE — Telephone Encounter (Signed)
 Pharmacy Patient Advocate Encounter  Received notification from [facility] [employer] that Prior Authorization for REPATHA has been APPROVED from 02/22/24 to 08/24/24. Spoke to pharmacy to process.Copay is $0.00.    PA #/Case ID/Reference #: [id number]

## 2024-02-22 NOTE — Telephone Encounter (Signed)
 Pt c/o medication issue:  1. Name of Medication: Evolocumab (REPATHA SURECLICK) 140 MG/ML SOAJ   2. How are you currently taking this medication (dosage and times per day)? As written  3. Are you having a reaction (difficulty breathing--STAT)? No   4. What is your medication issue? Per Pharmacy needs Prior Auth

## 2024-02-24 NOTE — Telephone Encounter (Signed)
error 

## 2024-05-05 ENCOUNTER — Ambulatory Visit: Payer: Self-pay

## 2024-05-05 VITALS — Ht 66.0 in | Wt 228.0 lb

## 2024-05-05 DIAGNOSIS — Z0000 Encounter for general adult medical examination without abnormal findings: Secondary | ICD-10-CM

## 2024-05-05 NOTE — Progress Notes (Signed)
 Because this visit was a virtual/telehealth visit,  certain criteria was not obtained, such a blood pressure, CBG if applicable, and timed get up and go. Any medications not marked as "taking" were not mentioned during the medication reconciliation part of the visit. Any vitals not documented were not able to be obtained due to this being a telehealth visit or patient was unable to self-report a recent blood pressure reading due to a lack of equipment at home via telehealth. Vitals that have been documented are verbally provided by the patient.   Subjective:   Joshua T Jaskulski Jr. is a 83 y.o. who presents for a Medicare Wellness preventive visit.  As a reminder, Annual Wellness Visits don't include a physical exam, and some assessments may be limited, especially if this visit is performed virtually. We may recommend an in-person follow-up visit with your provider if needed.  Visit Complete: Virtual I connected with  Joshua [name]. on 05/05/24 by a audio enabled telemedicine application and verified that I am speaking with the correct person using two identifiers.  Patient Location: Home  Provider Location: Office/Clinic  I discussed the limitations of evaluation and management by telemedicine. The patient expressed understanding and agreed to proceed.  Vital Signs: Because this visit was a virtual/telehealth visit, some criteria may be missing or patient reported. Any vitals not documented were not able to be obtained and vitals that have been documented are patient reported.  VideoDeclined- This patient declined Librarian, academic. Therefore the visit was completed with audio only.  Persons Participating in Visit: Wife and patient was present during visit.  AWV Questionnaire: Yes: Patient Medicare AWV questionnaire was completed by the patient on 05/04/2024; I have confirmed that all information answered by patient is correct and no changes since this  date.  Cardiac Risk Factors include: advanced age (>16men, >6 women);dyslipidemia;family history of premature cardiovascular disease;hypertension;male gender;obesity (BMI >30kg/m2);sedentary lifestyle     Objective:     Today's Vitals   05/05/24 0924  Weight: 228 lb (103.4 kg)  Height: 5\' 6"  (1.676 m)  PainSc: 0-No pain   Body mass index is 36.8 kg/m.     05/05/2024    9:26 AM 10/28/2023   11:16 AM 06/15/2023   10:21 AM 05/17/2023    2:21 AM 05/06/2023    9:34 AM 04/20/2023   10:01 AM 04/06/2023    9:40 AM  Advanced Directives  Does Patient Have a Medical Advance Directive? Yes No Yes Yes Yes Yes Yes  Type of Estate agent of [place];Living will  Living will;Healthcare Power of [employer] Power of [employer] Power of [place];Living will Living will Healthcare Power of [place];Living will  Does patient want to make changes to medical advance directive? No - Patient declined   Yes (ED - Information included in AVS) No - Patient declined  No - Patient declined  Copy of Healthcare Power of Attorney in Chart? Yes - validated most recent copy scanned in chart (See row information)   No - copy requested Yes - validated most recent copy scanned in chart (See row information)    Would patient like information on creating a medical advance directive?  No - Patient declined         Current Medications (verified) Outpatient Encounter Medications as of 05/05/2024  Medication Sig   ALPRAZolam  (XANAX ) 0.5 MG tablet TAKE 1 TABLET BY MOUTH AT BEDTIME AS NEEDED FOR RESTLESS LEGS   aspirin  EC 81 MG EC tablet Take  1 tablet (81 mg total) by mouth daily.   atenolol -chlorthalidone  (TENORETIC ) 100-25 MG tablet TAKE 1 TABLET BY MOUTH EVERY DAY   citalopram  (CELEXA ) 20 MG tablet TAKE 1 TABLET BY MOUTH EVERY DAY   esomeprazole  (NEXIUM ) 20 MG capsule Take 1 capsule (20 mg total) by mouth daily.   Evolocumab  (REPATHA  SURECLICK) 140 MG/ML SOAJ Inject 140 mg into the skin  every 14 (fourteen) days.   Evolocumab  (REPATHA  SURECLICK) 140 MG/ML SOAJ Inject 140 mg into the skin every 14 (fourteen) days.   ezetimibe  (ZETIA ) 10 MG tablet Take 1 tablet (10 mg total) by mouth daily.   FARXIGA  10 MG TABS tablet Take 1 tablet (10 mg total) by mouth daily before breakfast.   furosemide  (LASIX ) 20 MG tablet Take 1 tablet (20 mg total) by mouth daily.   gabapentin  (NEURONTIN ) 400 MG capsule TAKE ONE BY MOUTH FIVE TIMES A DAY AS DIRECTED   isosorbide  mononitrate (IMDUR ) 120 MG 24 hr tablet TAKE 1 TABLET BY MOUTH EVERY DAY   nitroGLYCERIN  (NITROSTAT ) 0.4 MG SL tablet Place 1 tablet (0.4 mg total) under the tongue every 5 (five) minutes as needed for chest pain.   potassium chloride  (KLOR-CON ) 10 MEQ tablet Take 1 tablet (10 mEq total) by mouth daily.   ranolazine  (RANEXA ) 500 MG 12 hr tablet Take 1 tablet (500 mg total) by mouth 2 (two) times daily.   rosuvastatin  (CRESTOR ) 20 MG tablet TAKE 1 TABLET BY MOUTH EVERY DAY   [DISCONTINUED] triamcinolone  (KENALOG ) 0.025 % ointment Apply 1 Application topically 2 (two) times daily.   No facility-administered encounter medications on file as of 05/05/2024.    Allergies (verified) Ace inhibitors, Atorvastatin, Rosuvastatin , Plavix [clopidogrel bisulfate], Sulfa antibiotics, and Sulfonamide derivatives   History: Past Medical History:  Diagnosis Date   Coronary artery disease involving native coronary artery of native heart with angina pectoris (HCC)    a. 1990 s/p CABG x 4 (RIMA->LAD, LIMA->LCX, VG->Diag, VG->RCA); b. 2011 s/p BMS to VG->RCA.; c. Unstable Angina 05/2017: Patent RIMA-LAD & LIMA-LCx, ostSVG-RCA 65% ISR & mSVG-RCA  99% - DES PCI to both // Myoview  05/2019:  EF 57, no ischemia or scar, Low Risk    GERD (gastroesophageal reflux disease)    History of echocardiogram    a. 01/2017 Echo: EF 55-60%, no rwma, mild MR, midlly dil LA, PASP .   History of hiatal hernia    Hyperlipidemia    Hypertension    Obesity    Past  Surgical History:  Procedure Laterality Date   CATARACT EXTRACTION W/ INTRAOCULAR LENS  IMPLANT, BILATERAL Bilateral    CORONARY ANGIOPLASTY WITH STENT PLACEMENT  2011   "RCA"   CORONARY ARTERY BYPASS GRAFT  1990   CABG X4   CORONARY STENT INTERVENTION N/A 06/24/2017   Procedure: Coronary Stent Intervention;  Surgeon: Arty Binning, MD;  Location: [facility] INVASIVE CV LAB;  Service: Cardiovascular: Ostial 4.0 x 12 mm Onyx DES reducing 70% stenosis to less than 40%. Distal body 3.5 x 15 Onyx DES reducing 99% stenosis to less than 30%.   LEFT HEART CATH AND CORS/GRAFTS ANGIOGRAPHY N/A 06/24/2017   Procedure: Left Heart Cath and Cors/Grafts Angiography;  Surgeon: Arty Binning, MD;  Location: [facility] INVASIVE CV LAB;  Service: Cardiovascular:  CTO native RCA, LAD & LCx (Graft Dependent). Patent LIMA-LCx & RIMA-LAD.  SVG-RCA: ost 65% ISR & mid 99% --> DES PCI to both   LEFT HEART CATH AND CORS/GRAFTS ANGIOGRAPHY N/A 01/04/2021   Procedure: LEFT HEART CATH AND CORS/GRAFTS  ANGIOGRAPHY;  Surgeon: Arty Binning, MD;  Location: [facility] INVASIVE CV LAB;  Service: Cardiovascular;  Laterality: N/A;   RIGHT/LEFT HEART CATH AND CORONARY/GRAFT ANGIOGRAPHY N/A 09/25/2022   Procedure: RIGHT/LEFT HEART CATH AND CORONARY/GRAFT ANGIOGRAPHY;  Surgeon: Odie Benne, MD;  Location: [facility] INVASIVE CV LAB;  Service: Cardiovascular;  Laterality: N/A;   Family History  Problem Relation Age of Onset   Cancer Mother        ovarian   Hyperlipidemia Son    Hypertension Son    Heart attack Father    Coronary artery disease Father    Social History   Socioeconomic History   Marital status: Married    Spouse name: Corbin Dess   Number of children: 2   Years of education: Not on file   Highest education level: GED or equivalent  Occupational History   Occupation: retired    Comment: manual labor (truck driver, heating and air)  Tobacco Use   Smoking status: Never   Smokeless tobacco: Former    Types: Chew    Quit date: 2000   Vaping Use   Vaping status: Never Used  Substance and Sexual Activity   Alcohol use: Never   Drug use: No   Sexual activity: Not Currently  Other Topics Concern   Not on file  Social History Narrative   Lives locally with his wife Corbin Dess - former [employer] ECG tech).  Does not routinely exercise.   Social Drivers of Corporate investment banker Strain: Low Risk  (05/05/2024)   Overall Financial Resource Strain (CARDIA)    Difficulty of Paying Living Expenses: Not very hard  Food Insecurity: Unknown (05/05/2024)   Hunger Vital Sign    Worried About Running Out of Food in the Last Year: Never true    Ran Out of Food in the Last Year: Patient declined  Transportation Needs: No Transportation Needs (05/05/2024)   PRAPARE - Administrator, Civil Service (Medical): No    Lack of Transportation (Non-Medical): No  Physical Activity: Inactive (05/05/2024)   Exercise Vital Sign    Days of Exercise per Week: 0 days    Minutes of Exercise per Session: 0 min  Stress: No Stress Concern Present (05/05/2024)   [employer] of Occupational Health - Occupational Stress Questionnaire    Feeling of Stress : Not at all  Social Connections: Socially Isolated (05/05/2024)   Social Connection and Isolation Panel [NHANES]    Frequency of Communication with Friends and Family: Never    Frequency of Social Gatherings with Friends and Family: Never    Attends Religious Services: Never    Database administrator or Organizations: No    Attends Engineer, structural: Not on file    Marital Status: Married    Tobacco Counseling Counseling given: Not Answered    Clinical Intake:  Pre-visit preparation completed: Yes  Pain : No/denies pain Pain Score: 0-No pain     BMI - recorded: 36.8 Nutritional Status: BMI > 30  Obese Nutritional Risks: None Diabetes: No  Lab Results  Component Value Date   HGBA1C 6.2 (A) 08/21/2021   HGBA1C 6.5 (H) 01/04/2021     How often do you need to  have someone help you when you read instructions, pamphlets, or other written materials from your doctor or pharmacy?: 1 - Never  Interpreter Needed?: No  Information entered by :: Orrin Yurkovich N. Akyra Bouchie, LPN.   Activities of Daily Living     05/05/2024  9:28 AM 05/04/2024    4:46 PM  In your present state of health, do you have any difficulty performing the following activities:  Hearing? 1 1  Vision? 0 0  Difficulty concentrating or making decisions? 0 0  Walking or climbing stairs? 1 1  Dressing or bathing? 0 0  Doing errands, shopping? 0 0  Preparing Food and eating ? N N  Using the Toilet? N N  In the past six months, have you accidently leaked urine? Y Y  Do you have problems with loss of bowel control? N N  Managing your Medications? N N  Managing your Finances? N N  Housekeeping or managing your Housekeeping? N N    Patient Care Team: Charise Companion, MD as PCP - General Felipe Horton Durenda Gilford, MD (Inactive) as PCP - Cardiology (Cardiology)  I have updated your Care Teams any recent Medical Services you may have received from other providers in the past year.     Assessment:    This is a routine wellness examination for Kauan.  Hearing/Vision screen Hearing Screening - Comments:: Some hearing difficulties. No hearing aids.  Vision Screening - Comments:: Wears otc reading eyeglasses, cataracts removed - not up to date with routine eye exams.    Goals Addressed             This Visit's Progress    05/05/2024: To maintain my health.         Depression Screen     05/05/2024    9:27 AM 10/28/2023   11:16 AM 09/23/2023    9:28 AM 09/17/2023    9:23 AM 05/25/2023   11:26 AM 05/06/2023    9:33 AM 04/06/2023    9:33 AM  PHQ 2/9 Scores  PHQ - 2 Score 0  0 0 2 1 0  PHQ- 9 Score 0   0 6 3   Exception Documentation  Patient refusal         Fall Risk     05/05/2024    9:52 AM 05/04/2024    4:46 PM 10/28/2023   11:16 AM 09/23/2023    9:26 AM 09/17/2023    9:21 AM  Fall  Risk   Falls in the past year? 1 1 0 1 1  Number falls in past yr: 0 0 0 1 1  Injury with Fall? 1 1 0    Risk for fall due to : Impaired balance/gait;Orthopedic patient      Follow up Falls evaluation completed;Education provided        MEDICARE RISK AT HOME:  Medicare Risk at Home Any stairs in or around the home?: Yes If so, are there any without handrails?: Yes Home free of loose throw rugs in walkways, pet beds, electrical cords, etc?: Yes Adequate lighting in your home to reduce risk of falls?: Yes Life alert?: No Use of a cane, walker or w/c?: No Grab bars in the bathroom?: No Shower chair or bench in shower?: No Elevated toilet seat or a handicapped toilet?: No  TIMED UP AND GO:  Was the test performed?  No  Cognitive Function: Declined/Normal: No cognitive concerns noted by patient or family. Patient alert, oriented, able to answer questions appropriately and recall recent events. No signs of memory loss or confusion.    05/05/2024    9:28 AM  MMSE - Mini Mental State Exam  Not completed: Unable to complete        05/05/2024    9:28 AM 04/06/2023    9:37  AM  6CIT Screen  What Year? 0 points 4 points  What month? 0 points 0 points  What time? 0 points 0 points  Count back from 20 0 points 0 points  Months in reverse 0 points 0 points  Repeat phrase 0 points 2 points  Total Score 0 points 6 points    Immunizations Immunization History  Administered Date(s) Administered   DTP 04/18/2009   Fluad Quad(high Dose 65+) 08/21/2021, 09/17/2022   Influenza Split 09/01/2012   Influenza Whole 09/05/2008   Influenza-Unspecified 08/19/2023   PFIZER(Purple Top)SARS-COV-2 Vaccination 02/11/2020, 03/06/2020, 10/19/2020   Pfizer Covid-19 Vaccine Bivalent Booster 85yrs & up 10/01/2021   Pfizer(Comirnaty)Fall Seasonal Vaccine 12 years and older 11/26/2022, 08/19/2023   Pneumococcal Conjugate-13 06/14/2014   Pneumococcal Polysaccharide-23 04/18/2009, 09/17/2011    RSV,unspecified 08/29/2023   Tdap 09/17/2011   Zoster Recombinant(Shingrix) 06/27/2023, 08/29/2023    Screening Tests Health Maintenance  Topic Date Due   DTaP/Tdap/Td (3 - Td or Tdap) 09/16/2021   COVID-19 Vaccine (7 - Pfizer risk 2024-25 season) 02/16/2024   INFLUENZA VACCINE  07/01/2024   Pneumonia Vaccine 80+ Years old  Completed   Zoster Vaccines- Shingrix  Completed   HPV VACCINES  Aged Out   Meningococcal B Vaccine  Aged Out   Colonoscopy  Discontinued    Health Maintenance  Health Maintenance Due  Topic Date Due   DTaP/Tdap/Td (3 - Td or Tdap) 09/16/2021   COVID-19 Vaccine (7 - Pfizer risk 2024-25 season) 02/16/2024   Health Maintenance Items Addressed: Yes Patient aware of current care gaps.  Immunization record was verified by [facility] and updated in patient's chart. Patient due for Dtap and Covid Vaccine.  Additional Screening:  Vision Screening: Recommended annual ophthalmology exams for early detection of glaucoma and other disorders of the eye. Would you like a referral to an eye doctor? No    Dental Screening: Recommended annual dental exams for proper oral hygiene  Community Resource Referral / Chronic Care Management: CRR required this visit?  No   CCM required this visit?  No   Plan:    I have personally reviewed and noted the following in the patient's chart:   Medical and social history Use of alcohol, tobacco or illicit drugs  Current medications and supplements including opioid prescriptions. Patient is not currently taking opioid prescriptions. Functional ability and status Nutritional status Physical activity Advanced directives List of other physicians Hospitalizations, surgeries, and ER visits in previous 12 months Vitals Screenings to include cognitive, depression, and falls Referrals and appointments  In addition, I have reviewed and discussed with patient certain preventive protocols, quality metrics, and best practice  recommendations. A written personalized care plan for preventive services as well as general preventive health recommendations were provided to patient.   Margette Sheldon, LPN   12/09/1476   After Visit Summary: (MyChart) Due to this being a telephonic visit, the after visit summary with patients personalized plan was offered to patient via MyChart   Notes: Patient aware of current care gaps.  Immunization record was verified by [facility] and updated in patient's chart. Patient due for Dtap and Covid Vaccines.

## 2024-05-05 NOTE — Patient Instructions (Signed)
 Mr. Penley , Thank you for taking time out of your busy schedule to complete your Annual Wellness Visit with me. I enjoyed our conversation and look forward to speaking with you again next year. I, as well as your care team,  appreciate your ongoing commitment to your health goals. Please review the following plan we discussed and let me know if I can assist you in the future. Your Game plan/ To Do List    Referrals: If you haven't heard from the office you've been referred to, please reach out to them at the phone provided.   Follow up Visits: Next Medicare AWV with our clinical staff: 05/08/2025 at 9:10 a.m. Phone Visit with Nurse Health Advisor   Have you seen your provider in the last 6 months (3 months if uncontrolled diabetes)? No Next Office Visit with your provider: Patient needs to call office to schedule follow up appointment with Dr. Andree Kayser  Clinician Recommendations:  Aim for 30 minutes of exercise or brisk walking, 6-8 glasses of water, and 5 servings of fruits and vegetables each day.       This is a list of the screening recommended for you and due dates:  Health Maintenance  Topic Date Due   DTaP/Tdap/Td vaccine (3 - Td or Tdap) 09/16/2021   COVID-19 Vaccine (7 - Pfizer risk 2024-25 season) 02/16/2024   Flu Shot  07/01/2024   Pneumonia Vaccine  Completed   Zoster (Shingles) Vaccine  Completed   HPV Vaccine  Aged Out   Meningitis B Vaccine  Aged Out   Colon Cancer Screening  Discontinued    Advanced directives: (In Chart) A copy of your advanced directives are scanned into your chart should your provider ever need it. Advance Care Planning is important because it:  [x]  Makes sure you receive the medical care that is consistent with your values, goals, and preferences  [x]  It provides guidance to your family and loved ones and reduces their decisional burden about whether or not they are making the right decisions based on your wishes.  Follow the link provided in your  after visit summary or read over the paperwork we have mailed to you to help you started getting your Advance Directives in place. If you need assistance in completing these, please reach out to us  so that we can help you!  See attachments for Preventive Care and Fall Prevention Tips.

## 2024-05-09 NOTE — Progress Notes (Signed)
I have reviewed this visit and agree with the documentation.   

## 2024-05-16 ENCOUNTER — Telehealth: Payer: Self-pay | Admitting: *Deleted

## 2024-05-16 NOTE — Telephone Encounter (Signed)
 I would not want to increase his alprazolam  but we could consider increasing his celexa  dose.Let me know if he wants to do that and I will send in a rx for higher dose. THANKS! Violetta Grice

## 2024-05-16 NOTE — Telephone Encounter (Signed)
 Wife calling because he is having trouble sleeping, thinks that maybe his medication needs needs adjusted. No appts available.  To PCP.  Macario Savin, CMA

## 2024-05-17 NOTE — Telephone Encounter (Signed)
 Spoke to patient's wife and she would like to bring patient in for an office visit.  There are no slots available on Dr. Karie Ou schedule.  Patient's wife would very much like to bring him in for insomnia.  Dr. Andree Kayser if there is any way that you can see patient the wife would greatly appreciate it.   I do not have the ability to double book or open up your schedule.  If you would like this done, let me know and I will get in touch with the schedulers up front.   Thanks, Marylouise Socks, CMA

## 2024-05-18 NOTE — Progress Notes (Unsigned)
 Assessment/Plan:   1.  Gait instability  -Peripheral neuropathy certainly may play a role here.  EMG also confirmed evidence of peripheral neuropathy.  -his most recently fall wasn't related to balance but rather something he should not have been doing (standing on one foot while rubbing something on the leg and he slipped and fell).    -discussed walker at all times.  Long discussion about safety  -DaTscan  negative.  2.  Lingual tremor  -He has lingual tremor, which has been there at least since 2017.  Lingual tremor can present with essential tremor, but he has just a little bit of evidence of that.    3.  B12 deficiency  -Patient's B12 when last checked in June, 2024 was 312.  Patient now on supplement, 1000 mcg daily.    Subjective:   Joshua [name]. was seen today in follow-up.  I last saw the patient about a year ago mostly for gait instability.  He decided to follow-up today in regards to tremor.  He has had nearly a decade of lingual tremor which has been unchanged.  MRI brain with and without contrast in 2022 (and prior to that in 2017) demonstrated remote left basal ganglia lacunar versus dilated Olympia Bianchi space.  He has small vessel disease.  He had a DaTscan  in 2024 which was unremarkable.   ALLERGIES:   Allergies  Allergen Reactions   Ace Inhibitors Cough   Atorvastatin Other (See Comments)    Muscle pain   Rosuvastatin  Other (See Comments)    Muscle pain   Plavix [Clopidogrel Bisulfate] Itching and Rash   Sulfa Antibiotics Itching and Rash   Sulfonamide Derivatives Itching and Rash    CURRENT MEDICATIONS:  No outpatient medications have been marked as taking for the 05/20/24 encounter (Appointment) with Vernelle Wisner, [place], DO.     Objective:   VITALS:   There were no vitals filed for this visit.    GEN:  The patient appears stated age and is in NAD. HEENT:  Normocephalic, atraumatic.  The mucous membranes are moist. The superficial temporal  arteries are without ropiness or tenderness.  There is tongue tremor.   CV:  brady.  regular Lungs:  CTAB Neck/HEME:  There are no carotid bruits bilaterally.  Neurological examination:  Orientation: The patient is alert and oriented x3.  Cranial nerves: There is good facial symmetry. Extraocular muscles are intact. The visual fields are full to confrontational testing. The speech is fluent and clear. Soft palate rises symmetrically and there is no tongue deviation. Hearing is intact to conversational tone. Sensation: Sensation is intact to light and pinprick throughout (facial, trunk, extremities). Vibration is decreased distally. There is no extinction with double simultaneous stimulation. There is no sensory dermatomal level identified. Motor: Strength is 5/5 in the bilateral upper and lower extremities.   Shoulder shrug is equal and symmetric.  There is no pronator drift. Deep tendon reflexes: Deep tendon reflexes are 2-/4 at the bilateral biceps, triceps, brachioradialis, patella and achilles. Plantar responses are downgoing bilaterally.  Movement examination: Tone: There is nl tone in the bilateral upper extremities.  The tone in the lower extremities is nl.  Abnormal movements: there is constant tongue tremor.  Tongue within mouth Coordination:  There is no decremation with RAM's, with any form of RAMS, including alternating supination and pronation of the forearm, hand opening and closing, finger taps, heel taps and toe taps.  Gait and Station: The patient pushes off to arise.  He  drags his heels  He does not have much armswing on either side.  Exam is similar to last visit I have reviewed and interpreted the following labs independently   Chemistry      Component Value Date/Time   NA 137 09/18/2023 1349   K 3.5 09/18/2023 1349   CL 97 09/18/2023 1349   CO2 25 09/18/2023 1349   BUN 14 09/18/2023 1349   CREATININE 1.07 09/18/2023 1349   CREATININE 0.87 05/14/2016 1234       Component Value Date/Time   CALCIUM  9.0 09/18/2023 1349   ALKPHOS 41 05/17/2023 0325   AST 31 05/17/2023 0325   ALT 16 05/17/2023 0325   BILITOT 0.4 05/17/2023 0325   BILITOT 0.4 08/21/2021 0947      Lab Results  Component Value Date   TSH 3.057 01/04/2021   Lab Results  Component Value Date   WBC 7.7 05/17/2023   HGB 12.3 (L) 05/17/2023   HCT 38.4 (L) 05/17/2023   MCV 83.8 05/17/2023   PLT 175 05/17/2023   Lab Results  Component Value Date   VITAMINB12 244 07/16/2016     Total time spent on today's visit was *** minutes, including both face-to-face time and nonface-to-face time.  Time included that spent on review of records (prior notes available to me/labs/imaging if pertinent), discussing treatment and goals, answering patient's questions and coordinating care.  Cc:  Charise Companion, MD

## 2024-05-20 ENCOUNTER — Ambulatory Visit: Payer: Self-pay | Admitting: Neurology

## 2024-05-20 VITALS — BP 128/60 | HR 60 | Wt 222.0 lb

## 2024-05-20 DIAGNOSIS — G609 Hereditary and idiopathic neuropathy, unspecified: Secondary | ICD-10-CM

## 2024-05-20 DIAGNOSIS — R251 Tremor, unspecified: Secondary | ICD-10-CM

## 2024-05-20 DIAGNOSIS — R413 Other amnesia: Secondary | ICD-10-CM

## 2024-05-20 MED ORDER — DULOXETINE HCL 30 MG PO CPEP: 30.0000 mg | ORAL_CAPSULE | Freq: Every day | ORAL | 1 refills | Status: DC

## 2024-05-20 NOTE — Patient Instructions (Addendum)
 Decrease citalopram  to 20mg , 1/2 tablet daily x 4 weeks and then STOP citalopram   START cymbalta (duloxetine) 30 mg daily.  I may have to increase that in the future    You have been referred for a neurocognitive evaluation (i.e., evaluation of memory and thinking abilities). Please bring someone with you to this appointment if possible, as it is helpful for the neuropsychologist to hear from both you and another adult who knows you well. Please bring eyeglasses and hearing aids if you wear them and take any medications as you normally would. Please fully abstain from all alcohol, marijuana, or other substances prior to your appointment.   The evaluation will take approximately 2-3 hours and has two parts:   The first part is a clinical interview with the neuropsychologist, Dr. Kitty Perkins or Dr. Donavon Fudge. During the interview, the neuropsychologist will speak with you and the individual you brought to the appointment.    The second part of the evaluation is testing with the doctor's technician, aka psychometrician, Dana or [employer]. During the testing, the technician will ask you to remember different types of material, solve problems, and answer some questionnaires. Your family member will not be present for this portion of the evaluation.   Please note: We have to reserve several hours of the neuropsychologist's time and the psychometrician's time for your evaluation appointment. As such, there is a No-Show fee of $100. If you are unable to attend any of your appointments, please contact our office as soon as possible to reschedule.

## 2024-05-20 NOTE — Telephone Encounter (Signed)
 Would they be open to a virtual visit on Tuesday afternoon? Let me know THANKS! Violetta Grice

## 2024-05-23 NOTE — Telephone Encounter (Signed)
 Called and LVM for patient's wife to call back if she is agreeable to a virtual visit with Dr. Rosalynn on 05/24/2024.  Cena JONELLE Pesa, CMA

## 2024-06-09 ENCOUNTER — Other Ambulatory Visit: Payer: Self-pay | Admitting: Family Medicine

## 2024-06-13 ENCOUNTER — Encounter: Payer: Self-pay | Admitting: Family Medicine

## 2024-06-14 ENCOUNTER — Other Ambulatory Visit: Payer: Self-pay | Admitting: Family Medicine

## 2024-06-14 DIAGNOSIS — R911 Solitary pulmonary nodule: Secondary | ICD-10-CM

## 2024-06-14 NOTE — Progress Notes (Signed)
 Recieved call from pt re CT scan last year done in ED---had a letter from radiology, I will set up

## 2024-06-20 ENCOUNTER — Ambulatory Visit
Admission: RE | Admit: 2024-06-20 | Discharge: 2024-06-20 | Disposition: A | Discharge status: Home / Self Care | Source: Ambulatory Visit | Attending: Family Medicine | Admitting: Family Medicine

## 2024-06-20 DIAGNOSIS — R911 Solitary pulmonary nodule: Secondary | ICD-10-CM

## 2024-06-20 DIAGNOSIS — R918 Other nonspecific abnormal finding of lung field: Secondary | ICD-10-CM | POA: Diagnosis not present

## 2024-06-26 ENCOUNTER — Ambulatory Visit: Payer: Self-pay | Admitting: Family Medicine

## 2024-07-06 ENCOUNTER — Ambulatory Visit (INDEPENDENT_AMBULATORY_CARE_PROVIDER_SITE_OTHER): Admitting: Psychology

## 2024-07-06 ENCOUNTER — Ambulatory Visit: Payer: Self-pay | Admitting: Psychology

## 2024-07-06 DIAGNOSIS — G3184 Mild cognitive impairment, so stated: Secondary | ICD-10-CM

## 2024-07-06 DIAGNOSIS — R4189 Other symptoms and signs involving cognitive functions and awareness: Secondary | ICD-10-CM

## 2024-07-06 NOTE — Progress Notes (Signed)
   Psychometrician Note   Cognitive testing was administered to Joshua [name]. by Lonell Jude, B.S. (psychometrist) under the supervision of Dr. Renda Beckwith, Psy.D., licensed psychologist on 07/06/2024. Joshua [name] did not appear overtly distressed by the testing session per behavioral observation or responses across self-report questionnaires. Rest breaks were offered.    The battery of tests administered was selected by Dr. Renda Beckwith, Psy.D. with consideration to Joshua [name] current level of functioning, the nature of his symptoms, emotional and behavioral responses during interview, level of literacy, observed level of motivation/effort, and the nature of the referral question. This battery was communicated to the psychometrist. Communication between Dr. Renda Beckwith, Psy.D. and the psychometrist was ongoing throughout the evaluation and Dr. Renda Beckwith, Psy.D. was immediately accessible at all times. Dr. Renda Beckwith, Psy.D. provided supervision to the psychometrist on the date of this service to the extent necessary to assure the quality of all services provided.    Joshua T Ivonne [name]. will return within approximately 1-2 weeks for an interactive feedback session with Dr. Beckwith at which time his test performances, clinical impressions, and treatment recommendations will be reviewed in detail. Joshua [name] understands he can contact our office should he require our assistance before this time.  A total of 115 minutes of billable time were spent face-to-face with Joshua [name] by the psychometrist. This includes both test administration and scoring time. Billing for these services is reflected in the clinical report generated by Dr. Renda Beckwith, Psy.D.  This note reflects time spent with the psychometrician and does not include test scores or any clinical interpretations made by Dr. Beckwith. The full report will follow in a separate note.

## 2024-07-06 NOTE — Progress Notes (Signed)
 NEUROPSYCHOLOGICAL EVALUATION [place]. [facility]  [place] Department of Neurology  Date of Evaluation: 07/06/2024  REASON FOR REFERRAL   Joshua [name] is an 83 year old, right-handed, White male with eight years of formal education. He was referred for neuropsychological evaluation by his neurologist, Asberry Tat, D.O., to assess current neurocognitive functioning, document potential cognitive deficits, and assist with treatment planning. This is his first neuropsychological evaluation.  SUMMARY OF RESULTS   Premorbid cognitive abilities are estimated to be in the low average range based on word reading and sociodemographic factors. Relative to this baseline estimate, performance today was variable across domains of processing speed, executive functioning, and learning/memory.  Specifically, performance was low on a task of rapid decoding but intact on tasks of visual scanning and visual attention/discrimination. With regard to executive functioning, scores were below expectations on tasks of verbal abstract reasoning and phonemic fluency, while tasks of alternating attention, visual abstract reasoning, and judgment were within expectations.  Immediate recall of a word list was within expectations; however, delayed recall and recognition were below expectations, with many intrusive and false positive errors observed. Immediate recall and recognition of short stories were intact, but delayed recall was low. Immediate and delayed recall of shapes were intact, but recognition was relatively low, likely due to false positive errors.  Remaining cognitive domains, including attention/working memory and visuospatial abilities, were within age-related expectations.   On self-report questionnaires, he did not endorse clinically significant symptoms of depression, anxiety, or excessive daytime sleepiness.  DIAGNOSTIC IMPRESSION   Results of the current evaluation indicated variable  performance across domains of processing speed, executive functioning, and learning/memory. Aside from recent mild difficulties with forgetting medications, functional independence is largely preserved. As such, a conservative diagnosis of mild cognitive impairment is made at this time. Etiology is most likely related to cerebrovascular disease, though a mixed picture--including a neurodegenerative process such as Alzheimer's disease--cannot be ruled out given observed memory weakness. Additional contributing factors may include variable sleep, reduced cognitive and social stimulation, and possible hearing loss. Ongoing monitoring is recommended to clarify the underlying cause of cognitive changes and to guide appropriate treatment strategies.  ICD-10 Codes: G31.84 Mild cognitive impairment (MCI)  RECOMMENDATIONS   A repeat neuropsychological evaluation in 12-18 months (or sooner if functional decline is noted) is recommended.  Consider referral for hearing evaluation due to wife's concern of possible hearing loss.  Aim to participate in activities that you find enjoyable and fulfilling, whether that be hobbies, socializing with loved ones, or being outdoors. This can improve mood, increase motivation, and offer cognitive stimulation.  Neurocognitive test performance is not a strong predictor of an individual's safety operating a motor vehicle. Should his family wish to pursue a formalized driving evaluation, they could reach out to the following agencies:  [employer] [facility] in [place]: [phone] [facility]: [phone] [facility]: [phone] [facility]: [phone] or [phone]  Continue managing vascular risk factors through a heart healthy diet (e.g., MIND, Mediterranean), physician-approved physical activity, and medication adherence.   Consider implementing compensatory strategies to maximize independence and maintain daily  functioning. Examples include:   -Adhere to routine. Compensatory strategies work best when they are used consistently. Use a planner, calendar, or white board that has the schedule and important events for the day clearly listed to reference and cross off when tasks are complete.   -Ask for written information, especially if it is new or unfamiliar (e.g., information provided at a doctor's appointment).   -Create an  organized environment. Keep items that can be easily misplaced in a sensible location and get into the habit of always returning the items to those places.   -Pay attention and reduce distractions. Make a point of focusing attention on information you want to remember. One-on-one interaction is more likely to facilitate attention and minimize distraction. Make eye contact and repeat the information out loud after you hear it. Reduce interruptions or distractions especially when attempting to learn new information.   -Create associations. When learning something new, think about and understand the information. Explain it in your own words or try to associate it with something you already know. Take notes to help remember important details.  -Evaluate goals and plan accordingly. When confronted by many different tasks, begin by making a list that prioritizes each task and estimates the time it will take to complete. Break down complicated tasks into smaller, more manageable steps.   -Focus on one task at a time and complete each task before starting another. Avoid multitasking.  DISPOSITION   Patient will follow up with the referring provider, Dr. Evonnie. Patient should return for repeat neuropsychological testing in 12-18 months to monitor his course and assist with diagnosis and treatment planning. He and his wife will be provided verbal feedback in approximately one week regarding the findings and impression during this visit.  The remainder of the report includes the details of the  patient's background and a table of results from the current evaluation, which support the summary and recommendations described above.   BACKGROUND   History of Presenting Illness: The following information was obtained from a review of medical records and an interview with the patient and his wife, Grayce. Patient initially established care with Dr. Evonnie at [facility] in 2017 for evaluation of lingual tremor. Please refer to the record for more details. He is now also being followed for gait instability, possibly due to peripheral neuropathy, and B12 deficiency. At his most recent neurology visit on 05/20/2024, concerns were raised about cognitive decline, specifically forgetting how to do things and difficulty learning new tasks. MoCA = 18/30. He was referred for a neuropsychological evaluation accordingly.  Cognitive Functioning: During today's appointment, the patient and his wife reported cognitive changes over the past couple of years. Patient believes these changes have remained stable, while his wife feels they may have gradually worsened. Patient specifically described episodes of forgetting why he enters a room, losing track of what he had planned to do, and occasionally misplacing items. He also noted experiencing word-finding difficulties and slower processing speed. He did not report significant issues with attention, comprehension, or navigation. When asked about executive functioning, the patient explained that his wife handles most of the planning, organizing, and problem-solving; his daily routine is limited and predictable, so such issues do not often arise for him. His wife corroborated his report but added a few additional concerns. She noted that he sometimes asks the same questions repeatedly, needs reminders for appointments, and does not always remember how to get to places he has been before--or that he has even been there. Regarding his attention, she described it as variable,  saying, "some days he is on, and some days he is in [place]." She has also observed a general slowing in his processing speed as well as mild word-finding difficulties. She agreed that he follows a routine and does not often encounter situations requiring complex problem-solving, although she occasionally sees mild difficulties in this area. She also noted that  he seems to have more trouble understanding directions and following assembly instructions than he did in the past.  Physical Functioning: Patient reported difficulty falling asleep but denied issues with sleep maintenance. However, his wife noted that he generally does not sleep through the night, waking up every few hours. He takes daily naps. He has sleep apnea but is not currently using a CPAP machine. He has restless leg syndrome. Wife also mentioned that he sometimes talks in his sleep and acts out his dreams (e.g., punching, reaching). Appetite is stable. No changes to sense of taste or smell were reported. According to the patient, his vision and hearing are stable, but his wife has noticed possible hearing loss, as the television volume is higher than normal. He described poor balance, which his doctors have attributed to neuropathy. He has fallen multiple times in the past, although only once recently. He has a lingual tremor. Wife also noted an intermittent mild tremor in one of his hands, though she could not recall which hand.  Emotional Functioning: Patient described his recent mood as about the same. He endorsed feeling happy to be alive and hopeful for his future. He denied suicidal ideation and visual hallucinations. He spends most of his days watching television or sleeping.   Imaging: CT of the head (05/17/2023) documented mild atrophic changes. MRI of the brain (12/11/2020) documented a dilated perivascular space versus remote lacunar infarct in the left basal ganglia and chronic microvascular ischemic disease within the white  matter and pons. DaTscan  (05/15/2023) was normal.  Other Relevant Medical History: Remarkable for hypertension, hyperlipidemia, coronary artery disease, pulmonary hypertension, peripheral neuropathy, obstructive sleep apnea, and h/o NSTEMI. Please refer to the medical record for a more comprehensive problem list. No history of stroke, CNS infection, head injury, or seizure was reported.  Current Medications: Per record, alprazolam , aspirin , atenolol -chlorthalidone , duloxetine , esomeprazole , evolocumab , ezetimibe , Farxiga , furosemide , gabapentin , isosorbide  mononitrate, nitroglycerin , potassium chloride , ranolazine , and rosuvastatin .  Functional Status: Patient independently performs all basic activities of daily living without difficulty. He continues to drive without recent accidents, traffic violations, or navigational issues. He is primarily responsible for meal preparation, although his wife noted that they often eat microwave meals. Wife fills his pillbox, but he usually takes his medications on his own; however, she has noticed some forgotten doses over the past three to four weeks. Wife also manages the finances and appointments, but this is longstanding. Patient denied difficulties using household appliances or tools.   Family Neurological History: Unremarkable.  Psychiatric History: History of depression, anxiety, prior mental health treatment, suicidal ideation, hallucinations, and psychiatric hospitalizations was not reported.  Substance Use History: Patient denied current use of alcohol, nicotine, marijuana, and other illicit substances.  Social and Developmental History: Patient was born in [place], [place]. History of perinatal complications and developmental delays was not reported. Patient lives with his wife. They have a daughter and a son.  Educational and Occupational History: No history of childhood learning disability, special education services, or grade retention was reported.  Patient described himself as a Psychologist, sport and exercise. He dropped out of school in the ninth grade but later obtained his GED in the late 1990s. He held several jobs prior to his retirement. 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  BEHAVIORAL OBSERVATIONS   Patient arrived on time and was accompanied by his wife, Grayce. He ambulated independently. Gait was slow and notable for short steps without much arm swing  bilaterally. He was alert and oriented to person, city, and year but not to the hospital or exact date (i.e., stated it was 07/05). He was appropriately groomed and dressed for the setting. Lingual tremor was observed. Vision and hearing were adequate for testing purposes. Speech was of normal rate, prosody, and volume, though subtly mumbled. No conversational word-finding difficulties, paraphasic errors, or dysarthria were observed. Comprehension was conversationally intact. Thought processes were linear, logical, and coherent. Thought content was organized and devoid of delusions. Insight appeared fair. Affect was even and congruent with mood. He was cooperative and gave adequate effort during testing, including on embedded measures of performance validity. Results are thought to accurately reflect his cognitive functioning at this time.  NEUROPSYCHOLOGICAL TESTING RESULTS   Tests Administered: Animal Naming Test; Brief Visuospatial Memory Test-Revised (BVMT-R) - Form 1; [place]  Verbal Learning Test Third Edition (CVLT3) - Brief Form; Controlled Oral Word Association Test (COWAT): FAS; Epworth Sleepiness Scale (ESS); Geriatric Anxiety Scale-10 Item (GAS-10); Geriatric Depression Scale Short Form (GDS-SF); Neuropsychological Assessment Battery (NAB) Form 1 - Subtest(s): Naming, Judgement; Repeatable Battery for the Assessment of Neuropsychological Status Update (RBANS Update) Form A - Subtest(s): Line Orientation; Test of  Premorbid Functioning (TOPF); Trail Making Test (TMT); Wechsler Adult Intelligence Scale Fifth Edition (WAIS-5) - Subtest(s): Similarities, Clinical cytogeneticist, Matrix Reasoning, Digits Forward, Digit Sequencing, Coding, Symbol Search, Digits Backward; and Wechsler Memory Scale Fourth Edition (WMS-IV) - Subtest(s): Logical Memory (LM).  Test results are provided in the table below. Whenever possible, the patient's scores were compared against age-, sex-, and education-corrected normative samples. Interpretive descriptions are based on the AACN consensus conference statement on uniform labeling (Guilmette et al., 2020).  PREMORBID FUNCTIONING RAW  RANGE  TOPF 18 StdS=82 Low Average  ATTENTION & WORKING MEMORY RAW  RANGE  WAIS-5 Digits Forward -- ss=7 Low Average  WAIS-5 Digits Backward -- ss=8 Average  WAIS-5 Digit Sequencing -- ss=7 Low Average  PROCESSING SPEED RAW  RANGE  Trails A 67''0e T=44 Average  WAIS-5 Coding  -- ss=5 Below Average  WAIS-5 Symbol Search -- ss=8 Average  EXECUTIVE FUNCTION RAW  RANGE  Trails B 250''1e T=48 Average  WAIS-5 Matrix Reasoning -- ss=8 Average  WAIS-5 Similarities -- ss=2 Exceptionally Low  COWAT Letter Fluency 4+2+6 T=28 Exceptionally Low  NAB Judgement -- T=63 High Average  LANGUAGE RAW  RANGE  COWAT Letter Fluency 4+2+6 T=28 Exceptionally Low  Animal Naming Test 13 T=45 Average  NAB Naming Test 26/31 T=41 WNL  VISUOSPATIAL RAW  RANGE  RBANS Line Orientation -- 26-50%ile Average  WAIS-5 Block Design -- ss=11 Average  BVMT-R Copy Trial 11/12 -- WNL  VERBAL LEARNING & MEMORY RAW  RANGE  CVLT3 Total 1-4 2,5,7,5 StdS=84 Low Average  CVLT3 SDFR  4/9 ss=5 Below Average  CVLT3 LDFR  3/9 ss=5 Below Average  CVLT3 LDCR  3/9 ss=3 Exceptionally Low  CVLT3 Recognition Hits 6 ss=7 Low Average  CVLT3 Recognition False+ 13 ss=1 Exceptionally Low  CVLT3 Discriminability -- ss=1 Exceptionally Low  CVLT3 Intrusions 9 ss=5 Below Average  CVLT3 Repetitions 0 ss=12 High  Average  CVLT3 Forced Choice 9/9 -- WNL  WMS-IV LM-I  (5+6+4)/53 ss=6 Low Average  WMS-IV LM-II  (0+3)/39 ss=5 Below Average  WMS-IV LM Recognition  (6+9)/23 17-25%ile Low Average  VISUAL LEARNING & MEMORY RAW  RANGE  BVMT-R Trial 1 4/12 T=50 Average  BVMT-R Trial 2 4/12 T=43 Average  BVMT-R Trial 3 5/12 T=42 Low Average  BVMT-R Total Recall 13/36 T=44 Average  BVMT-R Delayed  Recall 4/12 T=41 Low Average  BVMT-R Recognition Hits 5 -- --  BVMT-R Recognition False Alarms 2 -- --  BVMT-R Recognition Discrimination Index 3 T=29 Exceptionally Low  *From Powell et al. (2022) -- -- --  QUESTIONNAIRES RAW  RANGE  GDS-SF 4 -- Minimal  GAS-10 6 -- Minimal  ESS 8 -- WNL  *Note: ss = scaled score; StdS = standard score; T = t-score; C/S = corrected raw score; WNL = within normal limits; BNL= below normal limits; D/C = discontinued. Scores from skewed distributions are typically interpreted as WNL (>=16th %ile) or BNL (<16th %ile).   INFORMED CONSENT   Patient was provided with a verbal description of the nature and purpose of the neuropsychological evaluation. Also reviewed were the foreseeable risks and/or discomforts and benefits of the procedure, limits of confidentiality, and mandatory reporting requirements of this provider. Patient was given the opportunity to have their questions answered. Oral consent to participate was provided by the patient.   This report was prepared as part of a clinical evaluation and is not intended for forensic use.  SERVICE   This evaluation was conducted by Renda Beckwith, Psy.D. In addition to time spent directly with the patient, total professional time (180 minutes) includes record review, integration of relevant medical history, test selection, interpretation of findings, and report preparation. A technician, Lonell Jude, B.S., provided testing and scoring assistance (115 minutes).  Psychiatric Diagnostic Evaluation Services (Professional): 09208 x  1 Neuropsychological Testing Evaluation Services (Professional): 03867 x 1 Neuropsychological Testing Evaluation Services (Professional): 03866 x 2 Neuropsychological Test Administration and Scoring (Technician): [phone] x 1 Neuropsychological Test Administration and Scoring (Technician): [phone] x 3  This report was generated using voice recognition software. While this document has been carefully reviewed, transcription errors may be present. I apologize in advance for any inconvenience. Please contact me if further clarification is needed.            Renda Beckwith, Psy.D.             Neuropsychologist

## 2024-07-10 ENCOUNTER — Other Ambulatory Visit: Payer: Self-pay | Admitting: Family Medicine

## 2024-07-27 NOTE — Patient Instructions (Signed)
 INFORMATION FOR PATIENTS AFTER SKIN BIOPSY:   What You Need to Do:  1. Keep your current (large) bandage on for 24 hours and do not shower during this time. Leave today's bandage on until AFTER your next shower tomorrow.  Change your bandages every day starting tomorrow after that shower. Keep the bandages on while you shower. Change them once a day until a scab forms.  2. You can let the small steristrips fall off naturally and do not need to ever take them off.  They will eventually fall off on their own (after showering)  3. You may take showers after 24 hours, but DO NOT take tub baths, go in hot tubs or go swimming for seven days after the procedure.  4. You may use vasoline, bacitracin, or Polysporin ointment on the wounds as needed.  5. If a scab forms at the biopsy site, leave it alone.  6. If bleeding occurs, apply firm pressure for two minutes with a clean piece of gauze.   Please contact us  immediately if there is any redness, any signs of infection, or significant bleeding at the biopsy site.   Please call our office at [phone].

## 2024-07-28 DIAGNOSIS — S8011XA Contusion of right lower leg, initial encounter: Secondary | ICD-10-CM | POA: Diagnosis not present

## 2024-07-29 ENCOUNTER — Ambulatory Visit (INDEPENDENT_AMBULATORY_CARE_PROVIDER_SITE_OTHER): Admitting: Neurology

## 2024-07-29 DIAGNOSIS — G20C Parkinsonism, unspecified: Secondary | ICD-10-CM

## 2024-07-29 NOTE — Procedures (Signed)
 Punch Biopsy Procedure Note  Preprocedure Diagnosis: Bradykinesia; Tremor;   Postprocedure Diagnosis: same  Locations: Site 1: Left posterior cervical Site 2: Above left knee Site 3: Above left ankle  Indications: r/o alpha synucleinopathy  Anesthesia: 3 mL Lidocaine  1% with epinephrine  without added sodium bicarbonate  Procedure Details Patient informed of the risks (including but not limited to bleeding, pain, infection, scar and infection) and benefits of the procedure.  Informed consent obtained.  The areas which were chosen for biopsy, as above, and surrounding areas were given a sterile prep using betadyne and draped in the usual sterile fashion. The skin was then stretched perpendicular to the skin tension lines and sample removed using the 3 mm punch. Pressure applied, hemostasis achieved.   Dressing applied. The specimen(s) was sent for pathologic examination. The patient tolerated the procedure well.  Estimated Blood Loss: 0 ml  Condition: Stable  Complications: none.  Plan: 1. Instructed to keep the wound dry and covered for 24-48h and clean thereafter. 2. Warning signs of infection were reviewed.

## 2024-07-30 ENCOUNTER — Other Ambulatory Visit: Payer: Self-pay | Admitting: Internal Medicine

## 2024-08-04 ENCOUNTER — Ambulatory Visit: Admitting: Psychology

## 2024-08-04 DIAGNOSIS — G3184 Mild cognitive impairment, so stated: Secondary | ICD-10-CM | POA: Diagnosis not present

## 2024-08-04 NOTE — Progress Notes (Signed)
   NEUROPSYCHOLOGY FEEDBACK SESSION [place]. [facility]  [place] Department of Neurology  Date of Feedback Session: 08/04/2024  REASON FOR REFERRAL   Joshua [name] is an 83 year old, right-handed, White male with eight years of formal education. He was referred for neuropsychological evaluation by his neurologist, Asberry Tat, D.O., to assess current neurocognitive functioning, document potential cognitive deficits, and assist with treatment planning. This is his first neuropsychological evaluation.  FEEDBACK   Patient completed a comprehensive neuropsychological evaluation on 07/06/2024. Please refer to that encounter for the full report and recommendations. Briefly, results indicated variable performance across domains of processing speed, executive functioning, and learning/memory. Aside from recent mild difficulties with forgetting medications, functional independence is largely preserved. As such, a conservative diagnosis of mild cognitive impairment is made at this time. Etiology is most likely related to cerebrovascular disease, though a mixed picture--including a neurodegenerative process such as Alzheimer's disease--cannot be ruled out given observed memory weakness. Additional contributing factors may include variable sleep, reduced cognitive and social stimulation, and possible hearing loss.  Today, the patient was accompanied by his wife. They were provided verbal feedback regarding the findings and impression during this visit, and their questions were answered. A copy of the report was provided at the conclusion of the visit.  DISPOSITION   Patient will follow up with the referring provider, Dr. Evonnie. He should return for repeat neuropsychological testing in 12-18 months to monitor his course and assist with diagnosis and treatment planning.  SERVICE   This feedback session was conducted by Renda Beckwith, Psy.D. [place] unit of 03867 (35 minutes) was billed for Dr.  Beckwith' time spent in preparing, conducting, and documenting the current feedback session.  This report was generated using voice recognition software. While this document has been carefully reviewed, transcription errors may be present. I apologize in advance for any inconvenience. Please contact me if further clarification is needed.

## 2024-08-12 ENCOUNTER — Encounter: Payer: Self-pay | Admitting: Internal Medicine

## 2024-08-18 DIAGNOSIS — L918 Other hypertrophic disorders of the skin: Secondary | ICD-10-CM | POA: Diagnosis not present

## 2024-08-18 DIAGNOSIS — D692 Other nonthrombocytopenic purpura: Secondary | ICD-10-CM | POA: Diagnosis not present

## 2024-08-18 DIAGNOSIS — L570 Actinic keratosis: Secondary | ICD-10-CM | POA: Diagnosis not present

## 2024-08-18 DIAGNOSIS — L814 Other melanin hyperpigmentation: Secondary | ICD-10-CM | POA: Diagnosis not present

## 2024-08-18 DIAGNOSIS — D225 Melanocytic nevi of trunk: Secondary | ICD-10-CM | POA: Diagnosis not present

## 2024-08-18 DIAGNOSIS — L821 Other seborrheic keratosis: Secondary | ICD-10-CM | POA: Diagnosis not present

## 2024-08-24 ENCOUNTER — Telehealth: Payer: Self-pay | Admitting: Neurology

## 2024-08-24 NOTE — Telephone Encounter (Signed)
 Pts skin biopsy came back.  There was:  No evidence of alpha synuclein in the cutaneous nerves 2.  evidence of small fiber neuropathy 3.  noevidence of amyloid deposition within the cutaneous nerves  Please call pt/family and let them know that skin biopsy did not show to be abnormal pathology seen in Parkinsons disease.  This correlated with his normal DaTscan .  At this point, we would just continue to follow him clinically.  I did just place him on Cymbalta  last visit, and I see that he does not have a follow-up.  It is not urgent, but make sure that he gets one.  I can see that there is an opening for next Tuesday at 8:15 AM, but if he does not wish to take that, I can see him in several months, as it really is not too urgent.

## 2024-08-25 ENCOUNTER — Other Ambulatory Visit: Payer: Self-pay | Admitting: Internal Medicine

## 2024-08-25 NOTE — Progress Notes (Signed)
 Assessment/Plan:   1.  Gait instability with EMG confirmed peripheral neuropathy  - Patient complaining about burning paresthesias, and it is likely that the gait instability and burning paresthesias are both from peripheral neuropathy.  He is on a very large dose of gabapentin  already  - continue Duloxetine  30 mg daily.  May increase in future  -start voltaren  gel on feet   - As with previous, we discussed safety and use of a walker all the time.  He doesn't want to use that as its embarrassing.  -DaTscan  negative.  - Skin biopsy negative for alpha-synuclein.  2.  Lingual tremor  -He has lingual tremor, which has been there at least since 2017.  Lingual tremor can present with essential tremor, but he has just a little bit of evidence of essential tremor.    3.  B12 deficiency  -Patient's B12 when last checked in June, 2024 was 312.  Patient was on supplement, 1000 mcg daily but he stopped it so I told him to restart it.   4.  MCI  - Neurocognitive testing with Dr. Gayland in September, 2025 with evidence of MCI  Subjective:   Joshua [name]. was seen today in follow-up.  I last saw the patient about a year ago mostly for gait instability.  He decided to follow-up today in regards to tremor.  He has had nearly a decade of lingual tremor which has been unchanged.  MRI brain with and without contrast in 2022 (and prior to that in 2017) demonstrated remote left basal ganglia lacunar versus dilated Sonnie Rim space.  He had a skin biopsy done on July 29, 2024 which was negative for alpha-synuclein.  He saw Dr. Gayland for neurocognitive testing, which was done August 04, 2024.  This demonstrated MCI.  I did start him on duloxetine  last visit, in hopes that it would help him with both mood and neuropathy.  I tapered him off of the citalopram .  He reports it is doing good but wife denies that.  Wife states that he is a grumpy old man and his feet bother him.  Pt states that the  feet only bother him just under the little toe off and on.  Pt states that it is better than it was and it is completely gone on the R foot and better on the L foot.    One fall since last visit - he fell at the mailbox  - there are flowers planted near the mailbox and its hard to reach the mailbox and he lost balance and fell.  Wife has since dug the flowers up and moved them.  He did fall on the face and knees but no LOC.  Wife states that he went more sideways so protected the head mostly.     ALLERGIES:   Allergies  Allergen Reactions   Ace Inhibitors Cough   Atorvastatin Other (See Comments)    Muscle pain   Rosuvastatin  Other (See Comments)    Muscle pain   Plavix [Clopidogrel Bisulfate] Itching and Rash   Sulfa Antibiotics Itching and Rash   Sulfonamide Derivatives Itching and Rash    CURRENT MEDICATIONS:  Current Meds  Medication Sig   ALPRAZolam  (XANAX ) 0.5 MG tablet TAKE 1 TABLET BY MOUTH AT BEDTIME AS NEEDED FOR RESTLESS LEGS   aspirin  EC 81 MG EC tablet Take 1 tablet (81 mg total) by mouth daily.   atenolol -chlorthalidone  (TENORETIC ) 100-25 MG tablet TAKE 1 TABLET BY MOUTH EVERY DAY  DULoxetine  (CYMBALTA ) 30 MG capsule Take 1 capsule (30 mg total) by mouth daily.   esomeprazole  (NEXIUM ) 20 MG capsule Take 1 capsule (20 mg total) by mouth daily.   Evolocumab  (REPATHA  SURECLICK) 140 MG/ML SOAJ Inject 140 mg into the skin every 14 (fourteen) days.   Evolocumab  (REPATHA  SURECLICK) 140 MG/ML SOAJ Inject 140 mg into the skin every 14 (fourteen) days.   ezetimibe  (ZETIA ) 10 MG tablet Take 1 tablet (10 mg total) by mouth daily.   FARXIGA  10 MG TABS tablet TAKE 1 TABLET BY MOUTH DAILY BEFORE BREAKFAST.   furosemide  (LASIX ) 20 MG tablet TAKE 1 TABLET BY MOUTH EVERY DAY   gabapentin  (NEURONTIN ) 400 MG capsule TAKE ONE BY MOUTH FIVE TIMES A DAY AS DIRECTED (Patient taking differently: Take 400 mg by mouth 6 (six) times daily. Take one by mouth five times a day as directed)    isosorbide  mononitrate (IMDUR ) 120 MG 24 hr tablet Take 1 tablet (120 mg total) by mouth daily. Please keep scheduled appointment for future refills. Thank you.   nitroGLYCERIN  (NITROSTAT ) 0.4 MG SL tablet Place 1 tablet (0.4 mg total) under the tongue every 5 (five) minutes as needed for chest pain.   potassium chloride  (KLOR-CON ) 10 MEQ tablet Take 1 tablet (10 mEq total) by mouth daily.   ranolazine  (RANEXA ) 500 MG 12 hr tablet Take 1 tablet (500 mg total) by mouth 2 (two) times daily.   rosuvastatin  (CRESTOR ) 20 MG tablet TAKE 1 TABLET BY MOUTH EVERY DAY     Objective:   VITALS:   Vitals:   08/30/24 0800  BP: 132/74  Pulse: (!) 57  SpO2: 97%  Weight: 222 lb 12.8 oz (101.1 kg)    GEN:  The patient appears stated age and is in NAD. HEENT:  Normocephalic, atraumatic.  The mucous membranes are moist. The superficial temporal arteries are without ropiness or tenderness.  There is tongue tremor.   CV:  brady.  regular Lungs:  CTAB Neck/HEME:  There are no carotid bruits bilaterally.  Neurological examination:  Orientation:     05/20/2024   12:00 PM  Montreal Cognitive Assessment   Visuospatial/ Executive (0/5) 5  Naming (0/3) 3  Attention: Read list of digits (0/2) 2  Attention: Read list of letters (0/1) 1  Attention: Serial 7 subtraction starting at 100 (0/3) 0  Language: Repeat phrase (0/2) 0  Language : Fluency (0/1) 0  Abstraction (0/2) 0  Delayed Recall (0/5) 2  Orientation (0/6) 5  Total 18    Cranial nerves: There is good facial symmetry. Extraocular muscles are intact. The visual fields are full to confrontational testing. The speech is fluent and clear. Soft palate rises symmetrically and there is no tongue deviation. Hearing is intact to conversational tone. Sensation: Sensation is intact to light touch throughout Motor: Strength is 5/5 in the bilateral upper and lower extremities.   Shoulder shrug is equal and symmetric.  There is no pronator drift.   Movement  examination: Tone: There is nl tone in the bilateral upper extremities.  The tone in the lower extremities is nl.  Abnormal movements: there is constant tongue tremor.  Tongue within mouth.  There is a LUE rest tremor with ambulation Coordination:  There is no decremation with RAM's, with any form of RAMS, including alternating supination and pronation of the forearm, hand opening and closing, finger taps, heel taps and toe taps.  Gait and Station: The patient pushes off to arise.  He is wide based.   He  does not have much armswing on either side.  He is short stepped with L hand rest tremor I have reviewed and interpreted the following labs independently   Chemistry      Component Value Date/Time   NA 137 09/18/2023 1349   K 3.5 09/18/2023 1349   CL 97 09/18/2023 1349   CO2 25 09/18/2023 1349   BUN 14 09/18/2023 1349   CREATININE 1.07 09/18/2023 1349   CREATININE 0.87 05/14/2016 1234      Component Value Date/Time   CALCIUM  9.0 09/18/2023 1349   ALKPHOS 41 05/17/2023 0325   AST 31 05/17/2023 0325   ALT 16 05/17/2023 0325   BILITOT 0.4 05/17/2023 0325   BILITOT 0.4 08/21/2021 0947      Lab Results  Component Value Date   TSH 3.057 01/04/2021   Lab Results  Component Value Date   WBC 7.7 05/17/2023   HGB 12.3 (L) 05/17/2023   HCT 38.4 (L) 05/17/2023   MCV 83.8 05/17/2023   PLT 175 05/17/2023   Lab Results  Component Value Date   VITAMINB12 244 07/16/2016       Cc:  Rosalynn Camie CROME, MD

## 2024-08-25 NOTE — Telephone Encounter (Signed)
Called patients wife and gave results.

## 2024-08-29 ENCOUNTER — Encounter: Payer: Self-pay | Admitting: Neurology

## 2024-08-30 ENCOUNTER — Ambulatory Visit: Admitting: Neurology

## 2024-08-30 ENCOUNTER — Encounter: Payer: Self-pay | Admitting: Neurology

## 2024-08-30 VITALS — BP 132/74 | HR 57 | Wt 222.8 lb

## 2024-08-30 DIAGNOSIS — G609 Hereditary and idiopathic neuropathy, unspecified: Secondary | ICD-10-CM | POA: Diagnosis not present

## 2024-08-30 DIAGNOSIS — G3184 Mild cognitive impairment, so stated: Secondary | ICD-10-CM | POA: Diagnosis not present

## 2024-08-30 MED ORDER — DULOXETINE HCL 30 MG PO CPEP: 30.0000 mg | ORAL_CAPSULE | Freq: Every day | ORAL | 1 refills | Status: AC

## 2024-08-30 NOTE — Patient Instructions (Signed)
 Start voltaren  gel Restart b12 1000 mcg daily Continue duloxetine  30 mg daily Use a WALKER!!

## 2024-09-06 ENCOUNTER — Telehealth: Payer: Self-pay | Admitting: Pharmacy Technician

## 2024-09-06 DIAGNOSIS — E785 Hyperlipidemia, unspecified: Secondary | ICD-10-CM

## 2024-09-06 NOTE — Telephone Encounter (Signed)
 Hi, insurance is asking for lipid labs within the last 120 days. The las labs I see are from 08/2023. Can he get updated labs for this pa. Thank you   Pharmacy Patient Advocate Encounter   Received notification from Onbase that prior authorization for repatha  is required/requested.   Insurance verification completed.   The patient is insured through [facility] ADVANTAGE/RX ADVANCE.   Per test claim: PA required; PA started via CoverMyMeds. KEY [id number] . Please see clinical question(s) below that I am not finding the answer to in their chart and advise.

## 2024-09-06 NOTE — Telephone Encounter (Signed)
 RN called spoke to patient's wife Grayce-( per [facility])  informed  the patient will need to have a lipid panel before  Insurance will process prior authorization .   RN placed lab in [facility] system- wife is aware to have have patient to cme to lab this week .

## 2024-09-07 ENCOUNTER — Other Ambulatory Visit: Payer: Self-pay

## 2024-09-07 DIAGNOSIS — E785 Hyperlipidemia, unspecified: Secondary | ICD-10-CM | POA: Diagnosis not present

## 2024-09-08 ENCOUNTER — Ambulatory Visit: Payer: Self-pay

## 2024-09-08 LAB — LIPID PANEL
Chol/HDL Ratio: 2.4 ratio (ref 0.0–5.0)
Cholesterol, Total: 68 mg/dL — ABNORMAL LOW (ref 100–199)
HDL: 28 mg/dL — ABNORMAL LOW (ref >39)
LDL Chol Calc (NIH): 19 mg/dL (ref 0–99)
Triglycerides: 114 mg/dL (ref 0–149)
VLDL Cholesterol Cal: 21 mg/dL (ref 5–40)

## 2024-09-08 MED ORDER — EZETIMIBE 10 MG PO TABS: 10.0000 mg | ORAL_TABLET | Freq: Every day | ORAL | 3 refills | Status: DC

## 2024-09-08 NOTE — Telephone Encounter (Signed)
 Pharmacy Patient Advocate Encounter  Received notification from [employer] that Prior Authorization for repatha  has been APPROVED from 09/08/24 to 09/08/25   PA #/Case ID/Reference #: [id number]

## 2024-09-08 NOTE — Telephone Encounter (Signed)
 Pharmacy Patient Advocate Encounter   Received notification from Pt Calls Messages that prior authorization for repatha  is required/requested.   Insurance verification completed.   The patient is insured through [facility] ADVANTAGE/RX ADVANCE.   Per test claim: PA required; PA submitted to above mentioned insurance via Latent Key/confirmation #/EOC [id number] Status is pending

## 2024-09-09 NOTE — Telephone Encounter (Signed)
 Received cmm [id number] but pa was approved  Called [facility] [phone]  Filling now

## 2024-09-21 ENCOUNTER — Ambulatory Visit (INDEPENDENT_AMBULATORY_CARE_PROVIDER_SITE_OTHER): Admitting: Family Medicine

## 2024-09-21 VITALS — BP 130/54 | HR 58 | Wt 220.6 lb

## 2024-09-21 DIAGNOSIS — I10 Essential (primary) hypertension: Secondary | ICD-10-CM

## 2024-09-21 DIAGNOSIS — Z23 Encounter for immunization: Secondary | ICD-10-CM | POA: Diagnosis not present

## 2024-09-21 DIAGNOSIS — I25119 Atherosclerotic heart disease of native coronary artery with unspecified angina pectoris: Secondary | ICD-10-CM

## 2024-09-21 DIAGNOSIS — I272 Pulmonary hypertension, unspecified: Secondary | ICD-10-CM

## 2024-09-21 NOTE — Patient Instructions (Signed)
 CONSIDER USING YOUR CANE

## 2024-09-22 ENCOUNTER — Ambulatory Visit: Payer: Self-pay | Admitting: Family Medicine

## 2024-09-22 DIAGNOSIS — D649 Anemia, unspecified: Secondary | ICD-10-CM

## 2024-09-22 LAB — COMPREHENSIVE METABOLIC PANEL WITH GFR
ALT: 13 IU/L (ref 0–44)
AST: 30 IU/L (ref 0–40)
Albumin: 4 g/dL (ref 3.8–4.8)
Alkaline Phosphatase: 54 IU/L (ref 47–123)
BUN/Creatinine Ratio: 11 (ref 10–24)
BUN: 11 mg/dL (ref 8–27)
Bilirubin Total: 0.4 mg/dL (ref 0.0–1.2)
CO2: 24 mmol/L (ref 20–29)
Calcium: 8.9 mg/dL (ref 8.6–10.2)
Chloride: 98 mmol/L (ref 96–106)
Creatinine, Ser: 1.02 mg/dL (ref 0.76–1.27)
Globulin, Total: 2.1 g/dL (ref 1.5–4.5)
Glucose: 116 mg/dL — ABNORMAL HIGH (ref 70–99)
Potassium: 3.6 mmol/L (ref 3.5–5.2)
Sodium: 135 mmol/L (ref 134–144)
Total Protein: 6.1 g/dL (ref 6.0–8.5)
eGFR: 74 mL/min/1.73 (ref >59)

## 2024-09-22 LAB — CBC
Hematocrit: 34.6 % — ABNORMAL LOW (ref 37.5–51.0)
Hemoglobin: 10.1 g/dL — ABNORMAL LOW (ref 13.0–17.7)
MCH: 22.5 pg — ABNORMAL LOW (ref 26.6–33.0)
MCHC: 29.2 g/dL — ABNORMAL LOW (ref 31.5–35.7)
MCV: 77 fL — ABNORMAL LOW (ref 79–97)
Platelets: 192 x10E3/uL (ref 150–450)
RBC: 4.48 x10E6/uL (ref 4.14–5.80)
RDW: 15.8 % — ABNORMAL HIGH (ref 11.6–15.4)
WBC: 5.1 x10E3/uL (ref 3.4–10.8)

## 2024-09-22 NOTE — Progress Notes (Signed)
    CHIEF COMPLAINT / HPI:  1.F/u HTN and 2. f/u gait problems Refusing to use his walker or cane (he says it embarrasses him) 3. Feeling more fatigued in last months   PERTINENT  PMH / PSH: I have reviewed the patient's medications, allergies, past medical and surgical history, smoking status and updated in the EMR as appropriate.   OBJECTIVE:  BP (!) 130/54   Pulse (!) 58   Wt 220 lb 9.6 oz (100.1 kg)   SpO2 97%   BMI 35.61 kg/m  Vital signs reviewed. GENERAL: Well-developed, well-nourished, no acute distress. CARDIOVASCULAR: Regular rate and rhythm no murmur gallop or rub LUNGS: Clear to auscultation bilaterally, no rales or wheeze. ABDOMEN: Soft positive bowel sounds NEURO: No gross focal neurological deficits. MSK: Movement of extremity x 4.   ASSESSMENT / PLAN: Fatigue: check cbc 2. Gait abnormality: discussed need to use assistive device, concern for posisble future fall 3. Immunization update: flu shot today. Discussed other health manintenance and COVID booster  No problem-specific Assessment & Plan notes found for this encounter.   Camie Mulch MD

## 2024-09-28 ENCOUNTER — Other Ambulatory Visit (INDEPENDENT_AMBULATORY_CARE_PROVIDER_SITE_OTHER)

## 2024-09-28 DIAGNOSIS — D649 Anemia, unspecified: Secondary | ICD-10-CM

## 2024-09-28 LAB — POCT URINALYSIS DIP (MANUAL ENTRY)
Bilirubin, UA: NEGATIVE
Blood, UA: NEGATIVE
Glucose, UA: >=1000 mg/dL — AB
Ketones, POC UA: NEGATIVE mg/dL
Leukocytes, UA: NEGATIVE
Nitrite, UA: NEGATIVE
Protein Ur, POC: NEGATIVE mg/dL
Spec Grav, UA: 1.01 (ref 1.010–1.025)
Urobilinogen, UA: 0.2 U/dL
pH, UA: 6.5 (ref 5.0–8.0)

## 2024-09-28 LAB — POCT UA - MICROSCOPIC ONLY: WBC, Ur, HPF, POC: NONE SEEN (ref 0–5)

## 2024-09-29 LAB — IRON,TIBC AND FERRITIN PANEL
Ferritin: 16 ng/mL — ABNORMAL LOW (ref 30–400)
Iron Saturation: 6 % — CL (ref 15–55)
Iron: 27 ug/dL — ABNORMAL LOW (ref 38–169)
Total Iron Binding Capacity: 462 ug/dL — ABNORMAL HIGH (ref 250–450)
UIBC: 435 ug/dL — ABNORMAL HIGH (ref 111–343)

## 2024-09-29 LAB — RETICULOCYTES: Retic Ct Pct: 2.1 % (ref 0.6–2.6)

## 2024-09-29 LAB — TSH: TSH: 4.13 u[IU]/mL (ref 0.450–4.500)

## 2024-10-01 ENCOUNTER — Ambulatory Visit: Payer: Self-pay | Admitting: Family Medicine

## 2024-10-05 ENCOUNTER — Telehealth: Payer: Self-pay | Admitting: Family Medicine

## 2024-10-07 ENCOUNTER — Telehealth: Payer: Self-pay | Admitting: Family Medicine

## 2024-10-07 DIAGNOSIS — I25119 Atherosclerotic heart disease of native coronary artery with unspecified angina pectoris: Secondary | ICD-10-CM

## 2024-10-07 DIAGNOSIS — D500 Iron deficiency anemia secondary to blood loss (chronic): Secondary | ICD-10-CM

## 2024-10-07 NOTE — Telephone Encounter (Signed)
 lvm

## 2024-10-07 NOTE — Telephone Encounter (Signed)
 Spoke w pt and his wife regarding his anemia. I really want him to have a repeat colonoscopy and will send referral in. His previous GI physician (Dr. Donnald) has retired. Will send refrral to that same practice with the hope of getting  it done in next few weeks. Will hold off on iron replacement until we findout date of procedure Oral iron not a great idea for upcoming scope; could do iron infusion, especially if he were ot become more symptomatic) Joshua [name] is also going to have back surgery in next weeks so busy time for them.

## 2024-10-10 ENCOUNTER — Other Ambulatory Visit: Payer: Self-pay | Admitting: Pharmacist

## 2024-10-10 DIAGNOSIS — E785 Hyperlipidemia, unspecified: Secondary | ICD-10-CM

## 2024-10-10 DIAGNOSIS — I252 Old myocardial infarction: Secondary | ICD-10-CM

## 2024-10-10 MED ORDER — REPATHA SURECLICK 140 MG/ML ~~LOC~~ SOAJ: 140.0000 mg | SUBCUTANEOUS | 0 refills | Status: DC

## 2024-10-18 DIAGNOSIS — D509 Iron deficiency anemia, unspecified: Secondary | ICD-10-CM | POA: Diagnosis not present

## 2024-10-31 ENCOUNTER — Ambulatory Visit: Attending: Internal Medicine | Admitting: Internal Medicine

## 2024-10-31 VITALS — BP 100/57 | HR 50 | Ht 66.5 in | Wt 219.0 lb

## 2024-10-31 DIAGNOSIS — R600 Localized edema: Secondary | ICD-10-CM

## 2024-10-31 DIAGNOSIS — I361 Nonrheumatic tricuspid (valve) insufficiency: Secondary | ICD-10-CM

## 2024-10-31 DIAGNOSIS — I10 Essential (primary) hypertension: Secondary | ICD-10-CM

## 2024-10-31 DIAGNOSIS — I25119 Atherosclerotic heart disease of native coronary artery with unspecified angina pectoris: Secondary | ICD-10-CM

## 2024-10-31 DIAGNOSIS — I252 Old myocardial infarction: Secondary | ICD-10-CM | POA: Diagnosis not present

## 2024-10-31 DIAGNOSIS — E785 Hyperlipidemia, unspecified: Secondary | ICD-10-CM

## 2024-10-31 MED ORDER — RANOLAZINE ER 500 MG PO TB12: 500.0000 mg | ORAL_TABLET | Freq: Two times a day (BID) | ORAL | 3 refills | Status: AC

## 2024-10-31 MED ORDER — FUROSEMIDE 40 MG PO TABS: 40.0000 mg | ORAL_TABLET | Freq: Every day | ORAL | 3 refills | Status: AC

## 2024-10-31 NOTE — Progress Notes (Signed)
 Cardiology Office Note:  .    Date:  10/31/2024  ID:  Joshua [name]., DOB 01-18-1941, MRN [id number] PCP: Joshua Camie CROME, MD  [facility] Providers Cardiologist:  Joshua LELON Claudene DOUGLAS, MD (Inactive)     CC: Secondary prevention visit  History of Present Illness: .    Joshua [name]. is a 83 y.o. male with a hx of Hx of CAD with NSTEMI, with CABG in 1990 (RIMA-LAD, LIMA-> LCX, SVG-Diag (100% stenosed), SVG RCA (2011 BMS with 2018 iDES)), HTN, HLD, Mild MR. OSA on CPAP. 2024: Referred to Lipid clinic for PSCK9i (on zetia  and max tolerated statin, LDL 91). They did not receive the referral.  Joshua [name] is an 82 year old male with coronary artery disease who presents for secondary prevention evaluations.  He has a history of coronary artery disease with prior coronary artery bypass grafting in the 1990s and complex interventions in 2011 and 2018. He started a PCSK9 inhibitor in 2024 due to statin intolerance, which has resulted in excellent LDL levels. He feels 'pretty good' and has not experienced any significant chest pain, only occasional 'twinges'. No chest discomfort or shortness of breath during activities such as mowing the lawn with a battery-powered mower, but his legs 'give out' when walking down the street, causing him to feel out of breath after about 30 minutes.  An echocardiogram last year showed trivial aortic regurgitation, mild mitral regurgitation, and improved tricuspid regurgitation to mild. He reports swelling in his left leg, which becomes more pronounced by the end of the day, while the right leg is less affected. He is currently taking Lasix .  His current medications include a PCSK9 inhibitor for hyperlipidemia, and he requires a refill on ranolazine . He is also on a combination pill that includes atenolol . He has allergies to certain statins, which previously necessitated the use of ezetimibe .  Discussed the use of AI scribe software for clinical note  transcription with the patient, who gave verbal consent to proceed.   Relevant histories: .  Social - comes with Wife ROS: As per HPI.   Studies Reviewed: .     Cardiac Studies & Procedures   ______________________________________________________________________________________________ CARDIAC CATHETERIZATION  CARDIAC CATHETERIZATION 09/25/2022  Conclusion   Mid LM to Dist LM lesion is 100% stenosed.   Ost LAD to Mid LAD lesion is 100% stenosed.   Ramus lesion is 100% stenosed.   Ost Cx to Prox Cx lesion is 100% stenosed.   Ost RCA to Prox RCA lesion is 100% stenosed.   Origin to Prox Graft lesion is 100% stenosed (SVG to Diagonal)   Origin lesion is 50% stenosed.   Mid Graft lesion is 30% stenosed.   Dist Graft lesion is 40% stenosed.   Non-stenotic Dist RCA lesion.   LIMA to OM patent   RIMA to LAD patent   SVG to PDA patent  Severe native CAD with chronic occlusion of the left main and proximal RCA s/p 4V CABG with 3/4 patent grafts Patent LIMA graft to OM Patent RIMA graft to LAD Patent SVG to PDA with patent stents within the body of the graft Occluded SVG to Diagonal Normal right heart pressures Mild elevation LVEDP (19-23 mmHg)  Recommendations: Continue medical management of CAD.  Findings Coronary Findings Diagnostic  Dominance: Co-dominant  Left Main Mid LM to Dist LM lesion is 100% stenosed.  Left Anterior Descending There is moderate diffuse disease throughout the vessel. Ost LAD to Mid LAD lesion is 100% stenosed.  Ramus Intermedius Ramus lesion is 100% stenosed.  Left Circumflex There is moderate diffuse disease throughout the vessel. Ost Cx to Prox Cx lesion is 100% stenosed.  Right Coronary Artery Vessel is moderate in size. There is moderate diffuse disease throughout the vessel. Ost RCA to Prox RCA lesion is 100% stenosed. Non-stenotic Dist RCA lesion.  Right Posterior Descending Artery There is moderate disease in the  vessel.  LIMA LIMA Graft To 2nd Mrg LIMA.  Graft To 1st Diag Origin to Prox Graft lesion is 100% stenosed.  RIMA RIMA Graft To Dist LAD RIMA.  Saphenous Graft To Dist RCA SVG. Origin lesion is 50% stenosed. The lesion was previously treated . Mid Graft lesion is 30% stenosed. The lesion was previously treated . Dist Graft lesion is 40% stenosed.  Intervention  No interventions have been documented.   CARDIAC CATHETERIZATION  CARDIAC CATHETERIZATION 01/04/2021  Conclusion  Ostial occlusion of the left main and native right coronary.  Occlusion of saphenous vein graft to the diagonal.  Patent right internal mammary artery graft to the mid LAD.  Patent left internal mammary artery graft to the circumflex marginal.  Patent saphenous vein graft to the distal RCA which could not be selectively engaged due to overhanging ostial stents.  The distal two thirds of the vessel appeared to be widely patent.  There could be high-grade obstruction at the distal anastomosis or in the native RCA.  Overall normal LV function with normal LVEDP.  EF greater than 50%.  RECOMMENDATIONS:   Patient with advanced atherosclerosis, failed saphenous vein grafts including inability to selectively engaged the saphenous vein graft to the right coronary due to overhanging ostial stent placed in 2018.  Given the current findings, he is really not a candidate for repeated angiography as intervention is not possible unless there is clear evidence of anterior MI in which case perhaps right radial approach could be used to attempt selective engagement of the RIMA and perform salvage PCI on the LAD.  Same is true if there is concern for circumflex territory MI, perhaps the LIMA could be used as a conduit to treat native circumflex disease.  Findings Coronary Findings Diagnostic  Dominance: Co-dominant  Left Main Mid LM to Dist LM lesion is 100% stenosed.  Left Anterior Descending There is moderate  diffuse disease throughout the vessel. Ost LAD to Mid LAD lesion is 100% stenosed.  Ramus Intermedius Ramus lesion is 100% stenosed.  Left Circumflex There is moderate diffuse disease throughout the vessel. Ost Cx to Prox Cx lesion is 100% stenosed.  Right Coronary Artery Vessel is moderate in size. There is moderate diffuse disease throughout the vessel. Ost RCA to Prox RCA lesion is 100% stenosed. Non-stenotic Dist RCA lesion.  Right Posterior Descending Artery There is moderate disease in the vessel.  LIMA LIMA Graft To 2nd Mrg LIMA.  Graft To 1st Diag Origin to Prox Graft lesion is 100% stenosed.  RIMA RIMA Graft To Dist LAD RIMA.  Saphenous Graft To Dist RCA SVG. Origin lesion is 50% stenosed. The lesion was previously treated. Mid Graft lesion is 30% stenosed. The lesion was previously treated. Dist Graft lesion is 40% stenosed.  Intervention  No interventions have been documented.   STRESS TESTS  MYOCARDIAL PERFUSION IMAGING 05/23/2019  Interpretation Summary  Nuclear stress EF: 57%.  Normal perfusion No ischemia or scar  This is a low risk study.   ECHOCARDIOGRAM  ECHOCARDIOGRAM COMPLETE 09/03/2023  Narrative ECHOCARDIOGRAM REPORT    Patient Name:   Joshua [name]. Date of Exam: 09/03/2023 Medical Rec #:  [id number]            Height:       67.0 in Accession #:    [id number]           Weight:       228.2 lb Date of Birth:  [birth year]            BSA:          2.139 m Patient Age:    4 years             BP:           138/72 mmHg Patient Gender: M                    HR:           60 bpm. Exam Location:  [address]  Procedure: 2D Echo, Cardiac Doppler, Color Doppler and Intracardiac Opacification Agent  Indications:    I36.1 Nonrheumatic tricupsid regurgitation  History:        Patient has prior history of Echocardiogram examinations, most recent 06/19/2022. CAD and NSTEMI, Prior CABG, Signs/Symptoms:Edema; Risk Factors:Sleep Apnea and  HLD.  Sonographer:    Waldo Guadalajara RCS Referring Phys: [id number] Bular Hickok A Gearldean Lomanto  IMPRESSIONS   1. Left ventricular ejection fraction, by estimation, is 60 to 65%. The left ventricle has normal function. The left ventricle has no regional wall motion abnormalities. There is mild left ventricular hypertrophy. Left ventricular diastolic parameters were normal. 2. Right ventricular systolic function is mildly reduced. The right ventricular size is mildly enlarged. There is normal pulmonary artery systolic pressure. The estimated right ventricular systolic pressure is 34.8 mmHg. 3. The mitral valve is normal in structure. Mild mitral valve regurgitation. No evidence of mitral stenosis. 4. The aortic valve is abnormal. There is moderate calcification of the aortic valve. Aortic valve regurgitation is trivial. Aortic valve sclerosis/calcification is present, without any evidence of aortic stenosis. Aortic valve area, by VTI measures 1.53 cm. Aortic valve mean gradient measures 7.9 mmHg. Aortic valve Vmax measures 1.97 m/s. 5. The inferior vena cava is normal in size with greater than 50% respiratory variability, suggesting right atrial pressure of 3 mmHg.  FINDINGS Left Ventricle: Left ventricular ejection fraction, by estimation, is 60 to 65%. The left ventricle has normal function. The left ventricle has no regional wall motion abnormalities. Definity  contrast agent was given IV to delineate the left ventricular endocardial borders. The left ventricular internal cavity size was normal in size. There is mild left ventricular hypertrophy. Left ventricular diastolic parameters were normal.  Right Ventricle: The right ventricular size is mildly enlarged. No increase in right ventricular wall thickness. Right ventricular systolic function is mildly reduced. There is normal pulmonary artery systolic pressure. The tricuspid regurgitant velocity is 2.82 m/s, and with an assumed right atrial pressure  of 3 mmHg, the estimated right ventricular systolic pressure is 34.8 mmHg.  Left Atrium: Left atrial size was normal in size.  Right Atrium: Right atrial size was normal in size.  Pericardium: There is no evidence of pericardial effusion.  Mitral Valve: The mitral valve is normal in structure. Mild mitral valve regurgitation. No evidence of mitral valve stenosis.  Tricuspid Valve: The tricuspid valve is normal in structure. Tricuspid valve regurgitation is mild . No evidence of tricuspid stenosis.  Aortic Valve: The aortic valve is abnormal. There is moderate calcification of the aortic valve. Aortic valve regurgitation is trivial. Aortic valve  sclerosis/calcification is present, without any evidence of aortic stenosis. Aortic valve mean gradient measures 7.9 mmHg. Aortic valve peak gradient measures 15.6 mmHg. Aortic valve area, by VTI measures 1.53 cm.  Pulmonic Valve: The pulmonic valve was normal in structure. Pulmonic valve regurgitation is trivial. No evidence of pulmonic stenosis.  Aorta: The aortic root is normal in size and structure.  Venous: The inferior vena cava is normal in size with greater than 50% respiratory variability, suggesting right atrial pressure of 3 mmHg.  IAS/Shunts: No atrial level shunt detected by color flow Doppler.   LEFT VENTRICLE PLAX 2D LVIDd:         4.10 cm   Diastology LVIDs:         3.00 cm   LV e' medial:    9.14 cm/s LV PW:         1.30 cm   LV E/e' medial:  10.6 LV IVS:        1.20 cm   LV e' lateral:   10.10 cm/s LVOT diam:     2.00 cm   LV E/e' lateral: 9.6 LV SV:         64 LV SV Index:   30 LVOT Area:     3.14 cm   RIGHT VENTRICLE RV Basal diam:  4.20 cm RV S prime:     10.10 cm/s TAPSE (M-mode): 1.4 cm RVSP:           34.8 mmHg  LEFT ATRIUM             Index        RIGHT ATRIUM           Index LA diam:        4.50 cm 2.10 cm/m   RA Pressure: 3.00 mmHg LA Vol (A2C):   46.3 ml 21.65 ml/m  RA Area:     14.00 cm LA Vol  (A4C):   73.4 ml 34.32 ml/m  RA Volume:   33.00 ml  15.43 ml/m LA Biplane Vol: 59.7 ml 27.91 ml/m AORTIC VALVE AV Area (Vmax):    1.46 cm AV Area (Vmean):   1.43 cm AV Area (VTI):     1.53 cm AV Vmax:           197.31 cm/s AV Vmean:          132.611 cm/s AV VTI:            0.420 m AV Peak Grad:      15.6 mmHg AV Mean Grad:      7.9 mmHg LVOT Vmax:         91.70 cm/s LVOT Vmean:        60.300 cm/s LVOT VTI:          0.205 m LVOT/AV VTI ratio: 0.49  AORTA Ao Root diam: 3.30 cm Ao Asc diam:  3.20 cm  MITRAL VALVE               TRICUSPID VALVE TR Peak grad:   31.8 mmHg MV Decel Time:             TR Vmax:        282.00 cm/s MV E velocity: 96.90 cm/s  Estimated RAP:  3.00 mmHg MV A velocity: 92.80 cm/s  RVSP:           34.8 mmHg MV E/A ratio:  1.04 SHUNTS Systemic VTI:  0.20 m Systemic Diam: 2.00 cm  Soyla Merck MD Electronically signed by Soyla Merck MD Signature  Date/Time: 09/04/2023/7:49:12 AM    Final          ______________________________________________________________________________________________      Physical Exam:    VS:  BP (!) 100/57   Pulse (!) 50   Ht 5' 6.5 (1.689 m)   Wt 219 lb (99.3 kg)   SpO2 96%   BMI 34.82 kg/m    Wt Readings from Last 3 Encounters:  10/31/24 219 lb (99.3 kg)  09/21/24 220 lb 9.6 oz (100.1 kg)  08/30/24 222 lb 12.8 oz (101.1 kg)    Gen: no distress   Neck: No JVD Cardiac: No Rubs or Gallops, systolic Murmur, regular bradycardia, +2 radial pulses Respiratory: Clear to auscultation bilaterally, normal effort, normal  respiratory rate GI: Soft, nontender, non-distended  MS: Left leg edema;  moves all extremities Integument: Skin feels warm Neuro:  At time of evaluation, alert and oriented to person/place/time/situation  Psych: Normal affect, patient feels ok      ASSESSMENT AND PLAN: .    Coronary artery disease, status post CABG and PCI, with hyperlipidemia (statin intolerant, on PCSK9  inhibitor) - anatomy: RIMA-LAD, LIMA-> LCX, SVG-Diag (100% stenosed), SVG RCA (2011 BMS with 2018 iDES) Coronary artery disease is well-managed with aggressive medication therapy. LDL levels are excellent due to PCSK9 inhibitor therapy. No recent chest pain reported. Sinus bradycardia is intentional to allow more time for arterial filling. Zetia  (ezetimibe ) is deemed unnecessary due to excellent LDL control and statin intolerance. - Discontinued Zetia  (ezetimibe ) - Continue PCSK9 inhibitor therapy - Monitor for any changes in chest pain or symptoms  Pulmonary hypertension with lower extremity edema - WHO Functional Class II, Stage C, hypervolemic, WHO II-II suspected in the setting of OSA and obesity Pulmonary hypertension with associated lower extremity edema. Swelling is more pronounced in the left leg. Echocardiogram from last year showed improvement in regurgitation and function. Current symptoms suggest fluid overload. - Increased Lasix  to 40 mg PO daily - Ordered basic metabolic panel and basic natriuretic peptide in 1-2 weeks - Monitor for improvement in swelling and shortness of breath - Will consider repeating echocardiogram if symptoms persist or lab work is abnormal  Valvular heart disease: mild tricuspid regurgitation, trivial aortic regurgitation, and mild mitral regurgitation Valvular heart disease with mild tricuspid regurgitation, trivial aortic regurgitation, and mild mitral regurgitation. Echocardiogram from last year showed improvement in regurgitation and function. - Continue current management and monitor for any changes in symptoms; will get echo if worsening sx or high BNP  Sinus bradycardia with first degree AV block and right bundle branch block Sinus bradycardia with first degree AV block and right bundle branch block. Heart rate is intentionally low to support coronary artery disease management. No current symptoms warranting changes in management. - Continue current  management and monitor for any changes in symptoms  One year unless worsening sx  Longitudinal care: The evaluation and management services provided today reflect the complexity inherent in caring for this patient, including the ongoing longitudinal relationship and management of multiple chronic conditions and/or the need for care coordination. The visit required a comprehensive assessment and management plan tailored to the patient's unique needs Time was spent addressing not only the acute concerns but also the broader context of the patient's health, including preventive care, chronic disease management, and care coordination as appropriate.  Complex longitudinal is necessary for conditions including: Secondary CAD prevention requiring aggressive BP, HR control, and Ranexa  for sx control; Multi-factorial PH   Stanly Leavens, MD [facility] Cardiologist [facility]  [facility]  [address] [place], [place] [address] [phone]  12:36 PM

## 2024-10-31 NOTE — Patient Instructions (Signed)
 Medication Instructions:   INCREASE furosemide  to 40mg  daily  STOP zetia   *If you need a refill on your cardiac medications before your next appointment, please call your pharmacy*  Lab Work:  Non-Fasting BMET and BNP in 1-2 weeks (12/8 - 12/15)  If you have labs (blood work) drawn today and your tests are completely normal, you will receive your results only by: MyChart Message (if you have MyChart) OR A paper copy in the mail If you have any lab test that is abnormal or we need to change your treatment, we will call you to review the results.   Follow-Up: At [facility], you and your health needs are our priority.  As part of our continuing mission to provide you with exceptional heart care, our providers are all part of one team.  This team includes your primary Cardiologist (physician) and Advanced Practice Providers or APPs (Physician Assistants and Nurse Practitioners) who all work together to provide you with the care you need, when you need it.  Your next appointment:    12 months with Dr. Santo  We recommend signing up for the patient portal called MyChart.  Sign up information is provided on this After Visit Summary.  MyChart is used to connect with patients for Virtual Visits (Telemedicine).  Patients are able to view lab/test results, encounter notes, upcoming appointments, etc.  Non-urgent messages can be sent to your provider as well.   To learn more about what you can do with MyChart, go to [email or web address].   Other Instructions

## 2024-11-04 ENCOUNTER — Other Ambulatory Visit: Payer: Self-pay | Admitting: Internal Medicine

## 2024-11-09 DIAGNOSIS — D649 Anemia, unspecified: Secondary | ICD-10-CM | POA: Diagnosis not present

## 2024-11-09 LAB — HM COLONOSCOPY

## 2024-11-10 DIAGNOSIS — I10 Essential (primary) hypertension: Secondary | ICD-10-CM | POA: Diagnosis not present

## 2024-11-10 DIAGNOSIS — I25119 Atherosclerotic heart disease of native coronary artery with unspecified angina pectoris: Secondary | ICD-10-CM | POA: Diagnosis not present

## 2024-11-10 DIAGNOSIS — R600 Localized edema: Secondary | ICD-10-CM | POA: Diagnosis not present

## 2024-11-11 ENCOUNTER — Other Ambulatory Visit: Payer: Self-pay | Admitting: Family Medicine

## 2024-11-11 LAB — BASIC METABOLIC PANEL WITH GFR
BUN/Creatinine Ratio: 14 (ref 10–24)
BUN: 13 mg/dL (ref 8–27)
CO2: 24 mmol/L (ref 20–29)
Calcium: 8.6 mg/dL (ref 8.6–10.2)
Chloride: 99 mmol/L (ref 96–106)
Creatinine, Ser: 0.94 mg/dL (ref 0.76–1.27)
Glucose: 123 mg/dL — ABNORMAL HIGH (ref 70–99)
Potassium: 3.5 mmol/L (ref 3.5–5.2)
Sodium: 137 mmol/L (ref 134–144)
eGFR: 81 mL/min/1.73 (ref >59)

## 2024-11-11 LAB — BRAIN NATRIURETIC PEPTIDE: BNP: 156.7 pg/mL — ABNORMAL HIGH (ref 0.0–100.0)

## 2024-11-14 ENCOUNTER — Ambulatory Visit: Payer: Self-pay

## 2024-11-18 ENCOUNTER — Telehealth: Payer: Self-pay | Admitting: Internal Medicine

## 2024-11-18 MED ORDER — NITROGLYCERIN 0.4 MG SL SUBL: 0.4000 mg | SUBLINGUAL_TABLET | SUBLINGUAL | 0 refills | Status: AC | PRN

## 2024-11-18 NOTE — Telephone Encounter (Signed)
 Requested Prescriptions   Signed Prescriptions Disp Refills   nitroGLYCERIN  (NITROSTAT ) 0.4 MG SL tablet 25 tablet 0    Sig: Place 1 tablet (0.4 mg total) under the tongue every 5 (five) minutes as needed for chest pain.    Authorizing Provider: SANTO KELLY A    Ordering User: WILFRED, Nicolaas Savo  C

## 2024-11-18 NOTE — Telephone Encounter (Signed)
" °*  STAT* If patient is at the pharmacy, call can be transferred to refill team.   1. Which medications need to be refilled? (please list name of each medication and dose if known)   nitroGLYCERIN  (NITROSTAT ) 0.4 MG SL tablet   2. Would you like to learn more about the convenience, safety, & potential cost savings by using the [facility]?   3. Are you open to using the [facility] (Type [facility]. ).  4. Which pharmacy/location (including street and city if local pharmacy) is medication to be sent to?  [id number] - [place], [place] - [address]   5. Do they need a 30 day or 90 day supply?   Wife Quentin) stated patient medication has expired.  "

## 2024-11-23 ENCOUNTER — Encounter: Payer: Self-pay | Admitting: Family Medicine

## 2024-11-24 ENCOUNTER — Other Ambulatory Visit: Payer: Self-pay | Admitting: Internal Medicine

## 2024-12-07 ENCOUNTER — Other Ambulatory Visit: Payer: Self-pay

## 2024-12-07 ENCOUNTER — Other Ambulatory Visit (HOSPITAL_COMMUNITY): Payer: Self-pay

## 2024-12-07 ENCOUNTER — Encounter: Payer: Self-pay | Admitting: Internal Medicine

## 2024-12-07 ENCOUNTER — Encounter: Payer: Self-pay | Admitting: Family Medicine

## 2024-12-07 MED ORDER — POTASSIUM CHLORIDE ER 10 MEQ PO TBCR
10.0000 meq | EXTENDED_RELEASE_TABLET | Freq: Every day | ORAL | 3 refills | Status: AC
  Filled 2024-12-07: qty 90, 90d supply, fill #0

## 2024-12-07 MED ORDER — ROSUVASTATIN CALCIUM 20 MG PO TABS
20.0000 mg | ORAL_TABLET | Freq: Every day | ORAL | 3 refills | Status: AC
  Filled 2024-12-07: qty 90, 90d supply, fill #0

## 2024-12-08 ENCOUNTER — Telehealth (HOSPITAL_COMMUNITY): Payer: Self-pay | Admitting: Pharmacy Technician

## 2024-12-08 ENCOUNTER — Other Ambulatory Visit (HOSPITAL_COMMUNITY): Payer: Self-pay

## 2024-12-08 ENCOUNTER — Encounter (HOSPITAL_COMMUNITY): Payer: Self-pay

## 2024-12-08 ENCOUNTER — Telehealth (HOSPITAL_COMMUNITY): Payer: Self-pay | Admitting: Pharmacist

## 2024-12-08 ENCOUNTER — Other Ambulatory Visit: Payer: Self-pay | Admitting: Family Medicine

## 2024-12-08 DIAGNOSIS — D509 Iron deficiency anemia, unspecified: Secondary | ICD-10-CM

## 2024-12-08 DIAGNOSIS — I25119 Atherosclerotic heart disease of native coronary artery with unspecified angina pectoris: Secondary | ICD-10-CM

## 2024-12-08 MED ORDER — FUROSEMIDE 20 MG PO TABS: 20.0000 mg | ORAL_TABLET | Freq: Every day | ORAL | 3 refills | Status: AC

## 2024-12-08 MED ORDER — GABAPENTIN 400 MG PO CAPS
400.0000 mg | ORAL_CAPSULE | Freq: Every day | ORAL | 1 refills | Status: DC | PRN
  Filled 2024-12-08: qty 450, fill #0
  Filled 2024-12-12: qty 450, 94d supply, fill #0
  Filled 2024-12-15: qty 450, 90d supply, fill #0

## 2024-12-08 MED ORDER — POTASSIUM CHLORIDE ER 10 MEQ PO TBCR
10.0000 meq | EXTENDED_RELEASE_TABLET | Freq: Every day | ORAL | 3 refills | Status: AC
  Filled 2024-12-12 – 2024-12-19 (×2): qty 90, 90d supply, fill #0

## 2024-12-08 MED ORDER — BISACODYL EC 5 MG PO TBEC: DELAYED_RELEASE_TABLET | ORAL | 0 refills | Status: AC

## 2024-12-08 MED ORDER — ATENOLOL-CHLORTHALIDONE 100-25 MG PO TABS
1.0000 | ORAL_TABLET | Freq: Every day | ORAL | 3 refills | Status: AC
  Filled 2024-12-08 – 2024-12-21 (×4): qty 90, 90d supply, fill #0

## 2024-12-08 NOTE — Telephone Encounter (Signed)
 Patient referred to infusion pharmacy team for ambulatory infusion of IV iron.  Insurance - Energy Manager of care - Site of care: [facility] Dx code - D50.9/I25.119 IV Iron Therapy - Monoferric  1000mg  x 1 Infusion appointments - Scheduling team will schedule patient as soon as possible.    Sherry Pennant, PharmD, MPH, BCPS, CPP Clinical Pharmacist

## 2024-12-08 NOTE — Telephone Encounter (Signed)
 Auth Submission: NO AUTH NEEDED Site of care: [facility] Payer: [employer] Medication & CPT/J Code(s) submitted: Monoferric  (Ferrci derisomaltose) [phone] Diagnosis Code: D50.9 Route of submission (phone, fax, portal):  Phone # Fax # Auth type: Buy/Bill HB Units/visits requested: 1000MG  X 1 DOSE Reference number:  Approval from: 12/08/2024 to 02/05/25    Dagoberto Armour, CPhT [facility] Phone: [phone] 12/08/2024

## 2024-12-09 ENCOUNTER — Encounter (HOSPITAL_COMMUNITY): Payer: Self-pay | Admitting: Family Medicine

## 2024-12-12 ENCOUNTER — Ambulatory Visit (HOSPITAL_COMMUNITY)
Admission: RE | Admit: 2024-12-12 | Discharge: 2024-12-12 | Disposition: A | Discharge status: Home / Self Care | Source: Ambulatory Visit | Attending: Family Medicine | Admitting: Family Medicine

## 2024-12-12 ENCOUNTER — Telehealth: Payer: Self-pay | Admitting: Pharmacy Technician

## 2024-12-12 ENCOUNTER — Encounter: Payer: Self-pay | Admitting: Internal Medicine

## 2024-12-12 ENCOUNTER — Other Ambulatory Visit (HOSPITAL_COMMUNITY): Payer: Self-pay

## 2024-12-12 VITALS — BP 152/63 | HR 49 | Temp 97.6°F | Resp 15

## 2024-12-12 DIAGNOSIS — D509 Iron deficiency anemia, unspecified: Secondary | ICD-10-CM | POA: Insufficient documentation

## 2024-12-12 MED ORDER — SODIUM CHLORIDE 0.9 % IV SOLN
1000.0000 mg | Freq: Once | INTRAVENOUS | Status: AC
  Administered 2024-12-12: 1000 mg via INTRAVENOUS
  Filled 2024-12-12: qty 10

## 2024-12-12 NOTE — Telephone Encounter (Signed)
" ° °  Patient Advocate Encounter   The patient was approved for a [employer] grant that will help cover the cost of repatha  Total amount awarded, 2500.  Effective: 11/12/24 - 11/11/25   APW:[id number] ERW:[id number] Hmnle:[id number] PI:[id number] [employer] ID: [id number]   Pharmacy provided with approval and processing information. Patient informed via mychart  "

## 2024-12-13 ENCOUNTER — Encounter: Payer: Self-pay | Admitting: Internal Medicine

## 2024-12-13 ENCOUNTER — Other Ambulatory Visit (HOSPITAL_COMMUNITY): Payer: Self-pay

## 2024-12-13 DIAGNOSIS — E785 Hyperlipidemia, unspecified: Secondary | ICD-10-CM

## 2024-12-13 DIAGNOSIS — I252 Old myocardial infarction: Secondary | ICD-10-CM

## 2024-12-13 MED ORDER — ALPRAZOLAM 0.5 MG PO TABS
0.5000 mg | ORAL_TABLET | Freq: Every evening | ORAL | 5 refills | Status: AC | PRN
  Filled 2024-12-13 – 2024-12-14 (×2): qty 30, 30d supply, fill #0

## 2024-12-13 MED ORDER — REPATHA SURECLICK 140 MG/ML ~~LOC~~ SOAJ
140.0000 mg | SUBCUTANEOUS | 3 refills | Status: AC
  Filled 2024-12-13 – 2024-12-21 (×2): qty 6, 84d supply, fill #0

## 2024-12-14 ENCOUNTER — Other Ambulatory Visit (HOSPITAL_COMMUNITY): Payer: Self-pay

## 2024-12-14 ENCOUNTER — Other Ambulatory Visit: Payer: Self-pay

## 2024-12-15 ENCOUNTER — Other Ambulatory Visit (HOSPITAL_COMMUNITY): Payer: Self-pay

## 2024-12-19 ENCOUNTER — Other Ambulatory Visit: Payer: Self-pay

## 2024-12-19 ENCOUNTER — Other Ambulatory Visit (HOSPITAL_COMMUNITY): Payer: Self-pay

## 2024-12-21 ENCOUNTER — Other Ambulatory Visit: Payer: Self-pay

## 2024-12-21 ENCOUNTER — Other Ambulatory Visit (HOSPITAL_COMMUNITY): Payer: Self-pay

## 2025-01-06 ENCOUNTER — Other Ambulatory Visit: Payer: Self-pay | Admitting: Family Medicine

## 2025-09-04 ENCOUNTER — Ambulatory Visit: Payer: Self-pay

## 2025-09-04 ENCOUNTER — Institutional Professional Consult (permissible substitution): Admitting: Psychology

## 2025-09-11 ENCOUNTER — Encounter: Admitting: Psychology
